# Patient Record
Sex: Male | Born: 1940 | Race: White | Hispanic: No | Marital: Married | Smoking: Former smoker
Health system: Southern US, Community
[De-identification: ages and names within clinical notes are randomized; demographics above are authoritative.]

## PROBLEM LIST (undated history)

## (undated) DIAGNOSIS — D649 Anemia, unspecified: Secondary | ICD-10-CM

## (undated) DIAGNOSIS — D494 Neoplasm of unspecified behavior of bladder: Secondary | ICD-10-CM

## (undated) DIAGNOSIS — M1990 Unspecified osteoarthritis, unspecified site: Secondary | ICD-10-CM

## (undated) DIAGNOSIS — K219 Gastro-esophageal reflux disease without esophagitis: Secondary | ICD-10-CM

## (undated) DIAGNOSIS — Z87442 Personal history of urinary calculi: Secondary | ICD-10-CM

## (undated) DIAGNOSIS — C801 Malignant (primary) neoplasm, unspecified: Secondary | ICD-10-CM

## (undated) DIAGNOSIS — F419 Anxiety disorder, unspecified: Secondary | ICD-10-CM

## (undated) DIAGNOSIS — E119 Type 2 diabetes mellitus without complications: Secondary | ICD-10-CM

## (undated) DIAGNOSIS — R011 Cardiac murmur, unspecified: Secondary | ICD-10-CM

## (undated) DIAGNOSIS — G4730 Sleep apnea, unspecified: Secondary | ICD-10-CM

## (undated) HISTORY — PX: CHOLECYSTECTOMY: SHX55

## (undated) HISTORY — PX: OTHER SURGICAL HISTORY: SHX169

## (undated) HISTORY — PX: LIVER BIOPSY: SHX301

---

## 2012-09-20 DIAGNOSIS — G4730 Sleep apnea, unspecified: Secondary | ICD-10-CM | POA: Insufficient documentation

## 2012-09-20 DIAGNOSIS — K219 Gastro-esophageal reflux disease without esophagitis: Secondary | ICD-10-CM | POA: Diagnosis present

## 2021-12-16 ENCOUNTER — Other Ambulatory Visit: Payer: Self-pay

## 2021-12-16 ENCOUNTER — Inpatient Hospital Stay (HOSPITAL_COMMUNITY): Payer: MEDICARE

## 2021-12-16 ENCOUNTER — Inpatient Hospital Stay (HOSPITAL_COMMUNITY): Payer: MEDICARE | Attending: Hematology | Admitting: Hematology

## 2021-12-16 DIAGNOSIS — Z87442 Personal history of urinary calculi: Secondary | ICD-10-CM | POA: Insufficient documentation

## 2021-12-16 DIAGNOSIS — R319 Hematuria, unspecified: Secondary | ICD-10-CM | POA: Insufficient documentation

## 2021-12-16 DIAGNOSIS — K56609 Unspecified intestinal obstruction, unspecified as to partial versus complete obstruction: Secondary | ICD-10-CM | POA: Insufficient documentation

## 2021-12-16 DIAGNOSIS — D72825 Bandemia: Secondary | ICD-10-CM

## 2021-12-16 DIAGNOSIS — Z79899 Other long term (current) drug therapy: Secondary | ICD-10-CM | POA: Diagnosis not present

## 2021-12-16 DIAGNOSIS — K5900 Constipation, unspecified: Secondary | ICD-10-CM | POA: Diagnosis not present

## 2021-12-16 DIAGNOSIS — Z809 Family history of malignant neoplasm, unspecified: Secondary | ICD-10-CM

## 2021-12-16 DIAGNOSIS — R42 Dizziness and giddiness: Secondary | ICD-10-CM | POA: Insufficient documentation

## 2021-12-16 DIAGNOSIS — M549 Dorsalgia, unspecified: Secondary | ICD-10-CM | POA: Diagnosis not present

## 2021-12-16 DIAGNOSIS — D649 Anemia, unspecified: Secondary | ICD-10-CM | POA: Insufficient documentation

## 2021-12-16 DIAGNOSIS — Z801 Family history of malignant neoplasm of trachea, bronchus and lung: Secondary | ICD-10-CM | POA: Diagnosis not present

## 2021-12-16 DIAGNOSIS — Z87891 Personal history of nicotine dependence: Secondary | ICD-10-CM | POA: Insufficient documentation

## 2021-12-16 DIAGNOSIS — R519 Headache, unspecified: Secondary | ICD-10-CM | POA: Insufficient documentation

## 2021-12-16 DIAGNOSIS — D509 Iron deficiency anemia, unspecified: Secondary | ICD-10-CM | POA: Insufficient documentation

## 2021-12-16 DIAGNOSIS — D72828 Other elevated white blood cell count: Secondary | ICD-10-CM | POA: Insufficient documentation

## 2021-12-16 DIAGNOSIS — D72829 Elevated white blood cell count, unspecified: Secondary | ICD-10-CM | POA: Insufficient documentation

## 2021-12-16 LAB — RETICULOCYTES
Immature Retic Fract: 15.4 % (ref 2.3–15.9)
RBC.: 4.26 MIL/uL (ref 4.22–5.81)
Retic Count, Absolute: 82.6 10*3/uL (ref 19.0–186.0)
Retic Ct Pct: 1.9 % (ref 0.4–3.1)

## 2021-12-16 LAB — CBC WITH DIFFERENTIAL/PLATELET
Abs Immature Granulocytes: 0.11 10*3/uL — ABNORMAL HIGH (ref 0.00–0.07)
Basophils Absolute: 0.1 10*3/uL (ref 0.0–0.1)
Basophils Relative: 1 %
Eosinophils Absolute: 0.3 10*3/uL (ref 0.0–0.5)
Eosinophils Relative: 3 %
HCT: 28.5 % — ABNORMAL LOW (ref 39.0–52.0)
Hemoglobin: 8.8 g/dL — ABNORMAL LOW (ref 13.0–17.0)
Immature Granulocytes: 1 %
Lymphocytes Relative: 15 %
Lymphs Abs: 1.7 10*3/uL (ref 0.7–4.0)
MCH: 20.4 pg — ABNORMAL LOW (ref 26.0–34.0)
MCHC: 30.9 g/dL (ref 30.0–36.0)
MCV: 66.1 fL — ABNORMAL LOW (ref 80.0–100.0)
Monocytes Absolute: 0.7 10*3/uL (ref 0.1–1.0)
Monocytes Relative: 7 %
Neutro Abs: 8.2 10*3/uL — ABNORMAL HIGH (ref 1.7–7.7)
Neutrophils Relative %: 73 %
Platelets: 364 10*3/uL (ref 150–400)
RBC: 4.31 MIL/uL (ref 4.22–5.81)
RDW: 16.1 % — ABNORMAL HIGH (ref 11.5–15.5)
WBC: 11.1 10*3/uL — ABNORMAL HIGH (ref 4.0–10.5)
nRBC: 0 % (ref 0.0–0.2)

## 2021-12-16 LAB — FOLATE: Folate: 46.1 ng/mL (ref >5.9)

## 2021-12-16 LAB — IRON AND TIBC
Iron: 103 ug/dL (ref 45–182)
Saturation Ratios: 30 % (ref 17.9–39.5)
TIBC: 344 ug/dL (ref 250–450)
UIBC: 241 ug/dL

## 2021-12-16 LAB — VITAMIN B12: Vitamin B-12: 755 pg/mL (ref 180–914)

## 2021-12-16 LAB — LACTATE DEHYDROGENASE: LDH: 112 U/L (ref 98–192)

## 2021-12-16 LAB — C-REACTIVE PROTEIN: CRP: 0.7 mg/dL (ref <1.0)

## 2021-12-16 LAB — SEDIMENTATION RATE: Sed Rate: 11 mm/hr (ref 0–16)

## 2021-12-16 LAB — FERRITIN: Ferritin: 20 ng/mL — ABNORMAL LOW (ref 24–336)

## 2021-12-16 NOTE — Progress Notes (Signed)
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 male is here because of evaluation of neutrophilia, at the request of Dr. Macarthur Critchley.  Today he reports feeling good, and he is accompanied by his wife. He reports history of anemia for most of his life. He denies history of blood transfusions. He denies fevers and night sweats. He reports losing about 22 lbs since last summer; his baseline weight was 214 lbs. His wife reports his appetite has decreased. He reports occasional bloating after eating which well prevent him from eating any more meals that day. In January 2023 he has hospitalized for a small bowel obstruction which did not require surgery as well as a bladder infection for which he was given antibiotics; he is unaware of what caused his bowel obstruction. During this hospitalization he was given an iron infusion; he reports hematuria around the time of this hospitalization. He reports history of kidney stones. His WBC was first elevated October 2022. He denies recurrent infections over the past 1-2 years. He denies history of CVA, PE, DVT, and MI. He reports 2-3 previous colonoscopies the last of which was about 5 years ago. He denies skin rashes. He denies hematochezia and black stool.   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 He quit smoking 38 years ago. He denies family history of blood conditions. He denies family history of leukemia. His brother has lung and adrenal cancer, and his maternal grandmother had cancer although he cannot recall  what kind.   MEDICAL HISTORY:  No past medical history on file.  SURGICAL HISTORY:   SOCIAL HISTORY: Social History   Socioeconomic History   Marital status: Married    Spouse name: Not on file   Number of children: Not on file   Years of education: Not on file   Highest education level: Not on file  Occupational History   Not on file  Tobacco Use   Smoking status: Not on file   Smokeless tobacco: Not on file  Substance and Sexual Activity   Alcohol use: Not on file   Drug use: Not on file   Sexual activity: Not on file  Other Topics Concern   Not on file  Social History Narrative   Not on file   Social Determinants of Health   Financial Resource Strain: Not on file  Food Insecurity: Not on file  Transportation Needs: Not on file  Physical Activity: Not on file  Stress: Not on file  Social Connections: Not on file  Intimate Partner Violence: Not on file    FAMILY HISTORY: No family history on file.  ALLERGIES:  has no allergies on file.  MEDICATIONS:  Current Outpatient Medications  Medication Sig Dispense Refill   citalopram (CELEXA) 10 MG tablet Take by mouth.     HYDROcodone-acetaminophen (NORCO) 7.5-325 MG tablet Take 1 tablet by mouth 3 (three) times daily as needed.     HYDROcodone-acetaminophen (NORCO/VICODIN) 5-325 MG tablet Take 1 tablet by mouth 3 (three) times daily as needed.     metFORMIN (GLUCOPHAGE) 500 MG tablet Take by mouth 2 (two) times daily with a meal.  Multiple Vitamin (MULTI-VITAMIN DAILY PO) Take by mouth.     niacin 500 MG tablet Take 500 mg by mouth at bedtime.     omeprazole (PRILOSEC) 20 MG capsule Take by mouth.     saw palmetto 500 MG capsule Take 500 mg by mouth daily.     simvastatin (ZOCOR) 40 MG tablet Take by mouth.     tamsulosin (FLOMAX) 0.4 MG CAPS capsule Take 0.4 mg by mouth.     tizanidine (ZANAFLEX) 2 MG capsule Take 2 mg by mouth 3 (three) times daily.     No current facility-administered medications for this  visit.    REVIEW OF SYSTEMS:   Review of Systems  Constitutional:  Positive for appetite change and unexpected weight change (-22 lbs). Negative for fatigue and fever.  Gastrointestinal:  Positive for constipation. Negative for blood in stool.  Endocrine: Negative for hot flashes.  Genitourinary:  Positive for hematuria.   Musculoskeletal:  Positive for back pain.  Skin:  Negative for rash.  Neurological:  Positive for dizziness and headaches.  Psychiatric/Behavioral:  The patient is nervous/anxious.   All other systems reviewed and are negative.   PHYSICAL EXAMINATION: ECOG PERFORMANCE STATUS: 1 - Symptomatic but completely ambulatory  Vitals:   12/16/21 0955  BP: 132/63  Pulse: 88  Resp: 18  Temp: 98.4 F (36.9 C)  SpO2: 98%   Filed Weights   12/16/21 0955  Weight: 192 lb 1.6 oz (87.1 kg)   Physical Exam Vitals reviewed.  Constitutional:      Appearance: Normal appearance.  Cardiovascular:     Rate and Rhythm: Normal rate and regular rhythm.     Pulses: Normal pulses.     Heart sounds: Normal heart sounds.  Pulmonary:     Effort: Pulmonary effort is normal.     Breath sounds: Normal breath sounds.  Abdominal:     Palpations: Abdomen is soft. There is no hepatomegaly, splenomegaly or mass.     Tenderness: There is no abdominal tenderness.  Musculoskeletal:     Right lower leg: No edema.     Left lower leg: No edema.  Lymphadenopathy:     Cervical: No cervical adenopathy.     Right cervical: No superficial cervical adenopathy.    Left cervical: No superficial cervical adenopathy.     Upper Body:     Right upper body: No supraclavicular or axillary adenopathy.     Left upper body: No supraclavicular or axillary adenopathy.     Lower Body: No right inguinal adenopathy. No left inguinal adenopathy.  Neurological:     General: No focal deficit present.     Mental Status: He is alert and oriented to person, place, and time.  Psychiatric:        Mood and Affect:  Mood normal.        Behavior: Behavior normal.     LABORATORY DATA:  I have reviewed the data as listed Recent Results (from the past 2160 hour(s))  Sedimentation rate     Status: None   Collection Time: 12/16/21 11:10 AM  Result Value Ref Range   Sed Rate 11 0 - 16 mm/hr    Comment: Performed at [facility], [address], [place], [place] [address]  Vitamin B12     Status: None   Collection Time: 12/16/21 11:10 AM  Result Value Ref Range   Vitamin B-12 755 180 - 914 pg/mL    Comment: (NOTE) This assay is not validated for testing neonatal or myeloproliferative syndrome  specimens for Vitamin B12 levels. Performed at [facility], [address] [place], [place] [address]   Ferritin     Status: Abnormal   Collection Time: 12/16/21 11:10 AM  Result Value Ref Range   Ferritin 20 (L) 24 - 336 ng/mL    Comment: Performed at [facility], [address] [place], [place] [address]  Iron and TIBC ([facility] ONLY)     Status: None   Collection Time: 12/16/21 11:10 AM  Result Value Ref Range   Iron 103 45 - 182 ug/dL   TIBC 344 250 - 450 ug/dL   Saturation Ratios 30 17.9 - 39.5 %   UIBC 241 ug/dL    Comment: Performed at [facility], [address], [place], [place] [address]  Lactate dehydrogenase     Status: None   Collection Time: 12/16/21 11:10 AM  Result Value Ref Range   LDH 112 98 - 192 U/L    Comment: Performed at [facility], [address] [place], [place] [address]  Reticulocytes     Status: None   Collection Time: 12/16/21 11:10 AM  Result Value Ref Range   Retic Ct Pct 1.9 0.4 - 3.1 %   RBC. 4.26 4.22 - 5.81 MIL/uL   Retic Count, Absolute 82.6 19.0 - 186.0 K/uL   Immature Retic Fract 15.4 2.3 - 15.9 %    Comment: Performed at [facility], [address], [place], [place] [address]  CBC with Differential     Status: Abnormal   Collection Time: 12/16/21 11:10 AM  Result Value Ref Range   WBC 11.1 (H) 4.0 - 10.5 K/uL   RBC 4.31 4.22 - 5.81  MIL/uL   Hemoglobin 8.8 (L) 13.0 - 17.0 g/dL    Comment: Reticulocyte Hemoglobin testing may be clinically indicated, consider ordering this additional test [id number]    HCT 28.5 (L) 39.0 - 52.0 %   MCV 66.1 (L) 80.0 - 100.0 fL   MCH 20.4 (L) 26.0 - 34.0 pg   MCHC 30.9 30.0 - 36.0 g/dL   RDW 16.1 (H) 11.5 - 15.5 %   Platelets 364 150 - 400 K/uL   nRBC 0.0 0.0 - 0.2 %   Neutrophils Relative % 73 %   Neutro Abs 8.2 (H) 1.7 - 7.7 K/uL   Lymphocytes Relative 15 %   Lymphs Abs 1.7 0.7 - 4.0 K/uL   Monocytes Relative 7 %   Monocytes Absolute 0.7 0.1 - 1.0 K/uL   Eosinophils Relative 3 %   Eosinophils Absolute 0.3 0.0 - 0.5 K/uL   Basophils Relative 1 %   Basophils Absolute 0.1 0.0 - 0.1 K/uL   WBC Morphology MORPHOLOGY UNREMARKABLE    RBC Morphology MORPHOLOGY UNREMARKABLE    Smear Review MORPHOLOGY UNREMARKABLE    Immature Granulocytes 1 %   Abs Immature Granulocytes 0.11 (H) 0.00 - 0.07 K/uL   Burr Cells PRESENT    Basophilic Stippling PRESENT     Comment: Performed at [facility], [address], [place], [place] [address]    RADIOGRAPHIC STUDIES: I have personally reviewed the radiological images as listed and agreed with the findings in the report. No results found.  ASSESSMENT:  Neutrophilic leukocytosis and microcytic anemia: - Patient seen at the request of Dr. Macarthur Critchley for evaluation of leukocytosis and anemia. - CBCD (10/31/2021): WBC-15.4 (N 68%, L 16%, M 5%, E 8%, B 1%) Hb-10.3, MCV-61, PLT-452 - BCR/ABL PCR: Negative on 10/31/2021 - CBCD (09/14/2021): WBC-14.6 (elevated ANC and AEC), Hb-10.5, MCV 62, PLT-384 -  CBCD (08/29/2021): WBC-13, Hb-11, MCV-65, PLT-395 (elevated ANC and AEC-0.5 - He reports that he has been anemic all his life.  No prior history of blood transfusion.  No fevers or night sweats. - 22 pound weight loss since at least summer 2022.  Reports decreased appetite.  Reports bloating after eating. - In January 2023, he was hospitalized in [place] for  bowel obstruction and bladder infection.  Nonsurgical management.  He received IV iron x1. - He had colonoscopy x3, last one in the 5 years negative.   Social/family history: - He lives at home with his wife.  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  Quit smoking 38 years ago. - No family history of leukemia.  Brother had lung cancer.  Maternal grandmother had cancer.   PLAN:  Neutrophilic leukocytosis and microcytic anemia: - We discussed etiologies of leukocytosis and anemia. - We will check his CBC with differential today, LDH and reticulocyte count. - We will check for nutritional deficiencies and bone marrow infiltrative process. - We will obtain flow cytometry. - We will send JAK2 V617F and reflex testing. - We will also check hemoglobin electrophoresis as there was mention of thalassemia trait.  Patient does not know about it. - Will obtain CT scan reports from [facility] from recent hospitalization. - We will also obtain creatinine to see if he has any CKD.   All questions were answered. The patient knows to call the clinic with any problems, questions or concerns.  Derek Jack, MD 12/16/21 1:07 PM  [facility] [phone]   I, Thana Ates, am acting as a scribe for Dr. Derek Jack.  I, Derek Jack MD, have reviewed the above documentation for accuracy and completeness, and I agree with the above.

## 2021-12-16 NOTE — Patient Instructions (Addendum)
[place] at [facility] ?Discharge Instructions ? ?You were seen and examined today by Dr. Delton Coombes. Dr. Delton Coombes is a hematologist, meaning that he specializes in blood abnormalities. Dr. Delton Coombes discussed your past medical history, family history of cancers/blood conditions and the events that led to you being here today. ? ?You were referred to Dr. Delton Coombes due to increased white blood cells (WBCs). You were also slightly anemic. Dr. Delton Coombes has recommended additional lab work today in order to identify the cause of your elevated WBCs. ? ?You were previously checked for leukemia and it was negative. ? ?Follow-up as scheduled. ? ? ?Thank you for choosing [place] at [facility] to provide your oncology and hematology care.  To afford each patient quality time with our provider, please arrive at least 15 minutes before your scheduled appointment time.  ? ?If you have a lab appointment with the [facility] please come in thru the Main Entrance and check in at the main information desk. ? ?You need to re-schedule your appointment should you arrive 10 or more minutes late.  We strive to give you quality time with our providers, and arriving late affects you and other patients whose appointments are after yours.  Also, if you no show three or more times for appointments you may be dismissed from the clinic at the providers discretion.     ?Again, thank you for choosing [facility].  Our hope is that these requests will decrease the amount of time that you wait before being seen by our physicians.       ?_____________________________________________________________ ? ?Should you have questions after your visit to [facility], please contact our office at [phone] and follow the prompts.  Our office hours are 8:00 a.m. and 4:30 p.m. Monday - Friday.  Please note that voicemails left after 4:00 p.m. may not be returned until  the following business day.  We are closed weekends and major holidays.  You do have access to a nurse 24-7, just call the main number to the clinic [phone] and do not press any options, hold on the line and a nurse will answer the phone.   ? ?For prescription refill requests, have your pharmacy contact our office and allow 72 hours.   ? ?Due to Covid, you will need to wear a mask upon entering the hospital. If you do not have a mask, a mask will be given to you at the Main Entrance upon arrival. For doctor visits, patients may have 1 support person age 37 or older with them. For treatment visits, patients can not have anyone with them due to social distancing guidelines and our immunocompromised population.  ? ? ? ?

## 2021-12-17 LAB — HAPTOGLOBIN: Haptoglobin: 91 mg/dL (ref 34–355)

## 2021-12-17 LAB — RHEUMATOID FACTOR: Rheumatoid fact SerPl-aCnc: <10 IU/mL (ref <14.0)

## 2021-12-19 LAB — PROTEIN ELECTROPHORESIS, SERUM
A/G Ratio: 1.5 (ref 0.7–1.7)
Albumin ELP: 3.9 g/dL (ref 2.9–4.4)
Alpha-1-Globulin: 0.2 g/dL (ref 0.0–0.4)
Alpha-2-Globulin: 0.6 g/dL (ref 0.4–1.0)
Beta Globulin: 1 g/dL (ref 0.7–1.3)
Gamma Globulin: 0.7 g/dL (ref 0.4–1.8)
Globulin, Total: 2.6 g/dL (ref 2.2–3.9)
Total Protein ELP: 6.5 g/dL (ref 6.0–8.5)

## 2021-12-19 LAB — KAPPA/LAMBDA LIGHT CHAINS
Kappa free light chain: 29.6 mg/L — ABNORMAL HIGH (ref 3.3–19.4)
Kappa, lambda light chain ratio: 1.24 (ref 0.26–1.65)
Lambda free light chains: 23.9 mg/L (ref 5.7–26.3)

## 2021-12-19 LAB — METHYLMALONIC ACID, SERUM: Methylmalonic Acid, Quantitative: 154 nmol/L (ref 0–378)

## 2021-12-19 LAB — COPPER, SERUM: Copper: 92 ug/dL (ref 69–132)

## 2021-12-19 LAB — SURGICAL PATHOLOGY

## 2021-12-20 LAB — HGB FRACTIONATION CASCADE
Hgb A2: 5.1 % — ABNORMAL HIGH (ref 1.8–3.2)
Hgb A: 94.9 % — ABNORMAL LOW (ref 96.4–98.8)
Hgb F: 0 % (ref 0.0–2.0)
Hgb S: 0 %

## 2021-12-22 LAB — ANTINUCLEAR ANTIBODIES, IFA: ANA Ab, IFA: NEGATIVE

## 2021-12-26 LAB — CALR + JAK2 E12-15 + MPL (REFLEXED)

## 2021-12-26 LAB — JAK2 V617F, W REFLEX TO CALR/E12/MPL

## 2021-12-30 LAB — FLOW CYTOMETRY

## 2022-01-11 ENCOUNTER — Inpatient Hospital Stay (HOSPITAL_COMMUNITY): Payer: MEDICARE | Attending: Hematology | Admitting: Hematology

## 2022-01-11 VITALS — BP 137/65 | HR 85 | Temp 98.0°F | Resp 20 | Ht 70.87 in | Wt 188.5 lb

## 2022-01-11 DIAGNOSIS — D563 Thalassemia minor: Secondary | ICD-10-CM | POA: Insufficient documentation

## 2022-01-11 DIAGNOSIS — Z79899 Other long term (current) drug therapy: Secondary | ICD-10-CM | POA: Insufficient documentation

## 2022-01-11 DIAGNOSIS — D509 Iron deficiency anemia, unspecified: Secondary | ICD-10-CM | POA: Insufficient documentation

## 2022-01-11 DIAGNOSIS — R200 Anesthesia of skin: Secondary | ICD-10-CM | POA: Insufficient documentation

## 2022-01-11 DIAGNOSIS — D72828 Other elevated white blood cell count: Secondary | ICD-10-CM | POA: Diagnosis present

## 2022-01-11 DIAGNOSIS — R350 Frequency of micturition: Secondary | ICD-10-CM | POA: Diagnosis not present

## 2022-01-11 DIAGNOSIS — D72825 Bandemia: Secondary | ICD-10-CM

## 2022-01-11 NOTE — Patient Instructions (Signed)
[facility] at [facility] ?Discharge Instructions ? ?You were seen and examined today by Dr. Delton Coombes. He reviewed your most recent labs and everything looks good except your iron levels are low. Dr. Delton Coombes recommends two IV infusion of iron. Your labs did not show any signs of leukemia. He will check a lab that looks for chronic leukemia at your next visit but you have a very low risk. Please keep follow up appointments as scheduled in 6-8 weeks.  ? ? ?Thank you for choosing [place] at [facility] to provide your oncology and hematology care.  To afford each patient quality time with our provider, please arrive at least 15 minutes before your scheduled appointment time.  ? ?If you have a lab appointment with the [facility] please come in thru the Main Entrance and check in at the main information desk. ? ?You need to re-schedule your appointment should you arrive 10 or more minutes late.  We strive to give you quality time with our providers, and arriving late affects you and other patients whose appointments are after yours.  Also, if you no show three or more times for appointments you may be dismissed from the clinic at the providers discretion.     ?Again, thank you for choosing [facility].  Our hope is that these requests will decrease the amount of time that you wait before being seen by our physicians.       ?_____________________________________________________________ ? ?Should you have questions after your visit to [facility], please contact our office at [phone] and follow the prompts.  Our office hours are 8:00 a.m. and 4:30 p.m. Monday - Friday.  Please note that voicemails left after 4:00 p.m. may not be returned until the following business day.  We are closed weekends and major holidays.  You do have access to a nurse 24-7, just call the main number to the clinic [phone] and do not press any options,  hold on the line and a nurse will answer the phone.   ? ?For prescription refill requests, have your pharmacy contact our office and allow 72 hours.   ? ?Due to Covid, you will need to wear a mask upon entering the hospital. If you do not have a mask, a mask will be given to you at the Main Entrance upon arrival. For doctor visits, patients may have 1 support person age 67 or older with them. For treatment visits, patients can not have anyone with them due to social distancing guidelines and our immunocompromised population.  ? ?  ?

## 2022-01-11 NOTE — Progress Notes (Signed)
? ?[facility] ?[address] ?[place],  [address] ? ? ?CLINIC:  ?Medical Oncology/Hematology ? ?PCP:  ?Earney Mallet, MD ?[address] / [place] [address]  ?[phone] ? ?REASON FOR VISIT:  ?Follow-up for neutrophilic leukocytosis and microcytic anemia ? ?PRIOR THERAPY: none ? ?CURRENT THERAPY: under work-up ? ?INTERVAL HISTORY:  ?[name], a 81 y.o. male, returns for routine follow-up for his neutrophilic leukocytosis and microcytic anemia. Joshua Osborne was last seen on 12/16/2021. ? ?Today he reports feeling good, and he is accompanied by his wife. He is not taking iron tablets, but he reports he previously took iron tablets while hospitalized and did not tolerate them well.  ? ?REVIEW OF SYSTEMS:  ?Review of Systems  ?Constitutional:  Negative for appetite change and fatigue.  ?Genitourinary:  Positive for frequency.   ?Neurological:  Positive for numbness.  ?All other systems reviewed and are negative. ? ?PAST MEDICAL/SURGICAL HISTORY:  ?No past medical history on file. ? ? ?SOCIAL HISTORY:  ?Social History  ? ?Socioeconomic History  ? Marital status: Married  ?  Spouse name: Not on file  ? Number of children: Not on file  ? Years of education: Not on file  ? Highest education level: Not on file  ?Occupational History  ? Not on file  ?Tobacco Use  ? Smoking status: Not on file  ? Smokeless tobacco: Not on file  ?Substance and Sexual Activity  ? Alcohol use: Not on file  ? Drug use: Not on file  ? Sexual activity: Not on file  ?Other Topics Concern  ? Not on file  ?Social History Narrative  ? Not on file  ? ?Social Determinants of Health  ? ?Financial Resource Strain: Not on file  ?Food Insecurity: Not on file  ?Transportation Needs: Not on file  ?Physical Activity: Not on file  ?Stress: Not on file  ?Social Connections: Not on file  ?Intimate Partner Violence: Not on file  ? ? ?FAMILY HISTORY:  ?No family history on file. ? ?CURRENT MEDICATIONS:  ?Current Outpatient Medications   ?Medication Sig Dispense Refill  ? citalopram (CELEXA) 10 MG tablet Take by mouth.    ? HYDROcodone-acetaminophen (NORCO) 7.5-325 MG tablet Take 1 tablet by mouth 3 (three) times daily as needed.    ? HYDROcodone-acetaminophen (NORCO/VICODIN) 5-325 MG tablet Take 1 tablet by mouth 3 (three) times daily as needed.    ? latanoprost (XALATAN) 0.005 % ophthalmic solution 1 drop at bedtime.    ? metFORMIN (GLUCOPHAGE) 500 MG tablet Take by mouth 2 (two) times daily with a meal.    ? Multiple Vitamin (MULTI-VITAMIN DAILY PO) Take by mouth.    ? niacin 500 MG tablet Take 500 mg by mouth at bedtime.    ? omeprazole (PRILOSEC) 20 MG capsule Take by mouth.    ? saw palmetto 500 MG capsule Take 500 mg by mouth daily.    ? simvastatin (ZOCOR) 40 MG tablet Take by mouth.    ? tamsulosin (FLOMAX) 0.4 MG CAPS capsule Take 0.4 mg by mouth.    ? tizanidine (ZANAFLEX) 2 MG capsule Take 2 mg by mouth 3 (three) times daily.    ? ?No current facility-administered medications for this visit.  ? ? ?ALLERGIES:  ?Not on File ? ?PHYSICAL EXAM:  ?Performance status (ECOG): 1 - Symptomatic but completely ambulatory ? ?Vitals:  ? 01/11/22 1428  ?BP: 137/65  ?Pulse: 85  ?Resp: 20  ?Temp: 98 ?F (36.7 ?C)  ?SpO2: 99%  ? ?Wt Readings  from Last 3 Encounters:  ?01/11/22 188 lb 8 oz (85.5 kg)  ?12/16/21 192 lb 1.6 oz (87.1 kg)  ? ?Physical Exam ?Vitals reviewed.  ?Constitutional:   ?   Appearance: Normal appearance.  ?Cardiovascular:  ?   Rate and Rhythm: Normal rate and regular rhythm.  ?   Pulses: Normal pulses.  ?   Heart sounds: Normal heart sounds.  ?Pulmonary:  ?   Effort: Pulmonary effort is normal.  ?   Breath sounds: Normal breath sounds.  ?Neurological:  ?   General: No focal deficit present.  ?   Mental Status: He is alert and oriented to person, place, and time.  ?Psychiatric:     ?   Mood and Affect: Mood normal.     ?   Behavior: Behavior normal.  ? ? ?LABORATORY DATA:  ?I have reviewed the labs as listed.  ? ?  Latest Ref Rng & Units  12/16/2021  ? 11:10 AM  ?CBC  ?WBC 4.0 - 10.5 K/uL 11.1    ?Hemoglobin 13.0 - 17.0 g/dL 8.8    ?Hematocrit 39.0 - 52.0 % 28.5    ?Platelets 150 - 400 K/uL 364    ? ?   ? View : No data to display.  ?  ?  ?  ? ?   ?Component Value Date/Time  ? RBC 4.26 12/16/2021 1110  ? RBC 4.31 12/16/2021 1110  ? MCV 66.1 (L) 12/16/2021 1110  ? MCH 20.4 (L) 12/16/2021 1110  ? MCHC 30.9 12/16/2021 1110  ? RDW 16.1 (H) 12/16/2021 1110  ? LYMPHSABS 1.7 12/16/2021 1110  ? MONOABS 0.7 12/16/2021 1110  ? EOSABS 0.3 12/16/2021 1110  ? BASOSABS 0.1 12/16/2021 1110  ? ? ?DIAGNOSTIC IMAGING:  ?I have independently reviewed the scans and discussed with the patient. ?No results found.  ? ?ASSESSMENT:  ?Neutrophilic leukocytosis and microcytic anemia: ?- Patient seen at the request of Dr. Macarthur Critchley for evaluation of leukocytosis and anemia. ?- CBCD (10/31/2021): WBC-15.4 (N 68%, L 16%, M 5%, E 8%, B 1%) Hb-10.3, MCV-61, PLT-452 ?- BCR/ABL PCR: Negative on 10/31/2021 ?- CBCD (09/14/2021): WBC-14.6 (elevated ANC and AEC), Hb-10.5, MCV 62, PLT-384 ?- CBCD (08/29/2021): WBC-13, Hb-11, MCV-65, PLT-395 (elevated ANC and AEC-0.5 ?- He reports that he has been anemic all his life.  No prior history of blood transfusion.  No fevers or night sweats. ?- 22 pound weight loss since at least summer 2022.  Reports decreased appetite.  Reports bloating after eating. ?- In January 2023, he was hospitalized in [place] for bowel obstruction and bladder infection.  Nonsurgical management.  He received IV iron x1. ?- He had colonoscopy x3, last one in the 5 years negative. ? ?  ?Social/family history: ?- He lives at home with his wife.  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  Quit smoking 38 years ago. ?- No family history of leukemia.  Brother had lung cancer.  Maternal grandmother had cancer. ? ? 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 ?-  Anemia work-up showed hemoglobin 8.8 with MCV 66.  Ferritin was 20 and percent saturation 30.  B12, MMA, copper, folic acid were normal. ?- He has taken iron tablet without any benefit. ?- Recommend Feraheme weekly x2. ?- Discussed side effects in detail. ?- RTC 6 to 8 weeks with repeat CBC, ferritin and iron panel. ? ?2.  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 ?- JAK2 V617F and reflex mutation testing was negative. ?- Flow cytometry did not show any monoclonal B or T-cell population. ?- LDH was normal.  ANA and rheumatoid factor were negative. ?- No B symptoms.  Recommend BCR/ABL by FISH at next visit. ? ? ?Orders placed this encounter:  ?No orders of the defined types were placed in this encounter. ? ? ? ?Derek Jack, MD ?[place] ?[phone] ? ? ?I, Thana Ates, am acting as a scribe for Dr. Derek Jack. ? ?I, Derek Jack MD, have reviewed the above documentation for accuracy and completeness, and I agree with the above. ?  ? ? ?

## 2022-01-18 ENCOUNTER — Inpatient Hospital Stay (HOSPITAL_COMMUNITY): Payer: MEDICARE

## 2022-01-18 VITALS — BP 148/73 | HR 79 | Temp 97.2°F | Resp 18

## 2022-01-18 DIAGNOSIS — D72828 Other elevated white blood cell count: Secondary | ICD-10-CM | POA: Diagnosis not present

## 2022-01-18 DIAGNOSIS — D509 Iron deficiency anemia, unspecified: Secondary | ICD-10-CM

## 2022-01-18 MED ORDER — SODIUM CHLORIDE 0.9 % IV SOLN
510.0000 mg | Freq: Once | INTRAVENOUS | Status: AC
  Administered 2022-01-18: 510 mg via INTRAVENOUS
  Filled 2022-01-18: qty 510

## 2022-01-18 MED ORDER — SODIUM CHLORIDE 0.9 % IV SOLN: Freq: Once | INTRAVENOUS | Status: AC

## 2022-01-18 NOTE — Patient Instructions (Signed)
[facility]  Discharge Instructions: °Thank you for choosing [facility] to provide your oncology and hematology care.  °If you have a lab appointment with the Cancer Center, please come in thru the Main Entrance and check in at the main information desk. ° °Wear comfortable clothing and clothing appropriate for easy access to any Portacath or PICC line.  ° °We strive to give you quality time with your provider. You may need to reschedule your appointment if you arrive late (15 or more minutes).  Arriving late affects you and other patients whose appointments are after yours.  Also, if you miss three or more appointments without notifying the office, you may be dismissed from the clinic at the provider’s discretion.    °  °For prescription refill requests, have your pharmacy contact our office and allow 72 hours for refills to be completed.   ° °Today you received the following chemotherapy and/or immunotherapy agents Feraheme    °  °To help prevent nausea and vomiting after your treatment, we encourage you to take your nausea medication as directed. ° °BELOW ARE SYMPTOMS THAT SHOULD BE REPORTED IMMEDIATELY: °*FEVER GREATER THAN 100.4 F (38 °C) OR HIGHER °*CHILLS OR SWEATING °*NAUSEA AND VOMITING THAT IS NOT CONTROLLED WITH YOUR NAUSEA MEDICATION °*UNUSUAL SHORTNESS OF BREATH °*UNUSUAL BRUISING OR BLEEDING °*URINARY PROBLEMS (pain or burning when urinating, or frequent urination) °*BOWEL PROBLEMS (unusual diarrhea, constipation, pain near the anus) °TENDERNESS IN MOUTH AND THROAT WITH OR WITHOUT PRESENCE OF ULCERS (sore throat, sores in mouth, or a toothache) °UNUSUAL RASH, SWELLING OR PAIN  °UNUSUAL VAGINAL DISCHARGE OR ITCHING  ° °Items with * indicate a potential emergency and should be followed up as soon as possible or go to the Emergency Department if any problems should occur. ° °Please show the CHEMOTHERAPY ALERT CARD or IMMUNOTHERAPY ALERT CARD at check-in to the Emergency  Department and triage nurse. ° °Should you have questions after your visit or need to cancel or reschedule your appointment, please contact [facility] [phone]  and follow the prompts.  Office hours are 8:00 a.m. to 4:30 p.m. Monday - Friday. Please note that voicemails left after 4:00 p.m. may not be returned until the following business day.  We are closed weekends and major holidays. You have access to a nurse at all times for urgent questions. Please call the main number to the clinic [phone] and follow the prompts. ° °For any non-urgent questions, you may also contact your provider using MyChart. We now offer e-Visits for anyone 18 and older to request care online for non-urgent symptoms. For details visit [email or web address]. °  °Also download the MyChart app! Go to the app store, search "MyChart", open the app, select [facility], and log in with your MyChart username and password. ° °Due to Covid, a mask is required upon entering the hospital/clinic. If you do not have a mask, one will be given to you upon arrival. For doctor visits, patients may have 1 support person aged 18 or older with them. For treatment visits, patients cannot have anyone with them due to current Covid guidelines and our immunocompromised population.  °

## 2022-01-18 NOTE — Progress Notes (Signed)
Patient presents today for Feraheme infusion per providers order.  Vital signs WNL  Patient has no new complaints at this time.    Peripheral IV started and blood return noted pre and post infusion.  Feraheme given today per MD orders.  Stable during infusion without adverse affects.  Vital signs stable.  No complaints at this time.  Discharge from clinic ambulatory in stable condition.  Alert and oriented X 3.  Follow up with [facility] as scheduled.  

## 2022-01-25 ENCOUNTER — Inpatient Hospital Stay (HOSPITAL_COMMUNITY): Payer: MEDICARE

## 2022-01-25 ENCOUNTER — Encounter (HOSPITAL_COMMUNITY): Payer: Self-pay

## 2022-01-25 VITALS — BP 127/54 | HR 80 | Temp 97.5°F | Resp 18

## 2022-01-25 DIAGNOSIS — D72828 Other elevated white blood cell count: Secondary | ICD-10-CM | POA: Diagnosis not present

## 2022-01-25 DIAGNOSIS — D509 Iron deficiency anemia, unspecified: Secondary | ICD-10-CM

## 2022-01-25 MED ORDER — SODIUM CHLORIDE 0.9 % IV SOLN: Freq: Once | INTRAVENOUS | Status: AC

## 2022-01-25 MED ORDER — SODIUM CHLORIDE 0.9 % IV SOLN
510.0000 mg | Freq: Once | INTRAVENOUS | Status: AC
  Administered 2022-01-25: 510 mg via INTRAVENOUS
  Filled 2022-01-25: qty 510

## 2022-01-25 NOTE — Patient Instructions (Signed)
[facility]  Discharge Instructions: ?Thank you for choosing [facility] to provide your oncology and hematology care.  ?If you have a lab appointment with the [facility], please come in thru the Main Entrance and check in at the main information desk. ? ?Wear comfortable clothing and clothing appropriate for easy access to any Portacath or PICC line.  ? ?We strive to give you quality time with your provider. You may need to reschedule your appointment if you arrive late (15 or more minutes).  Arriving late affects you and other patients whose appointments are after yours.  Also, if you miss three or more appointments without notifying the office, you may be dismissed from the clinic at the provider?s discretion.    ?  ?For prescription refill requests, have your pharmacy contact our office and allow 72 hours for refills to be completed.   ? ?Today you received the following chemotherapy and/or immunotherapy agents Feraheme ? ? ?BELOW ARE SYMPTOMS THAT SHOULD BE REPORTED IMMEDIATELY: ?*FEVER GREATER THAN 100.4 F (38 ?C) OR HIGHER ?*CHILLS OR SWEATING ?*NAUSEA AND VOMITING THAT IS NOT CONTROLLED WITH YOUR NAUSEA MEDICATION ?*UNUSUAL SHORTNESS OF BREATH ?*UNUSUAL BRUISING OR BLEEDING ?*URINARY PROBLEMS (pain or burning when urinating, or frequent urination) ?*BOWEL PROBLEMS (unusual diarrhea, constipation, pain near the anus) ?TENDERNESS IN MOUTH AND THROAT WITH OR WITHOUT PRESENCE OF ULCERS (sore throat, sores in mouth, or a toothache) ?UNUSUAL RASH, SWELLING OR PAIN  ?UNUSUAL VAGINAL DISCHARGE OR ITCHING  ? ?Items with * indicate a potential emergency and should be followed up as soon as possible or go to the Emergency Department if any problems should occur. ? ?Please show the CHEMOTHERAPY ALERT CARD or IMMUNOTHERAPY ALERT CARD at check-in to the Emergency Department and triage nurse. ? ?Should you have questions after your visit or need to cancel or reschedule your appointment, please  contact [facility] [phone]  and follow the prompts.  Office hours are 8:00 a.m. to 4:30 p.m. Monday - Friday. Please note that voicemails left after 4:00 p.m. may not be returned until the following business day.  We are closed weekends and major holidays. You have access to a nurse at all times for urgent questions. Please call the main number to the clinic [phone] and follow the prompts. ? ?For any non-urgent questions, you may also contact your provider using MyChart. We now offer e-Visits for anyone 44 and older to request care online for non-urgent symptoms. For details visit [email or web address]. ?  ?Also download the MyChart app! Go to the app store, search "MyChart", open the app, select [facility], and log in with your MyChart username and password. ? ?Due to Covid, a mask is required upon entering the hospital/clinic. If you do not have a mask, one will be given to you upon arrival. For doctor visits, patients may have 1 support person aged 26 or older with them. For treatment visits, patients cannot have anyone with them due to current Covid guidelines and our immunocompromised population.  ?

## 2022-01-25 NOTE — Progress Notes (Signed)
Pt presents today for Feraheme IV iron infusion per provider's order. Vital signs stable and pt voiced no new complaints at this time.  Peripheral IV started with good blood return pre and post infusion.  Feraheme given today per MD orders. Tolerated infusion without adverse affects. Vital signs stable. No complaints at this time. Discharged from clinic ambulatory in stable condition. Alert and oriented x 3. F/U with [facility] as scheduled.    

## 2022-02-13 DIAGNOSIS — M545 Low back pain, unspecified: Secondary | ICD-10-CM | POA: Diagnosis present

## 2022-03-08 ENCOUNTER — Inpatient Hospital Stay (HOSPITAL_COMMUNITY): Payer: MEDICARE

## 2022-03-08 ENCOUNTER — Inpatient Hospital Stay (HOSPITAL_COMMUNITY): Payer: MEDICARE | Attending: Hematology | Admitting: Hematology

## 2022-03-08 VITALS — BP 136/64 | HR 76 | Temp 97.7°F | Resp 18 | Wt 184.4 lb

## 2022-03-08 DIAGNOSIS — Z79899 Other long term (current) drug therapy: Secondary | ICD-10-CM | POA: Diagnosis not present

## 2022-03-08 DIAGNOSIS — D509 Iron deficiency anemia, unspecified: Secondary | ICD-10-CM | POA: Insufficient documentation

## 2022-03-08 DIAGNOSIS — C642 Malignant neoplasm of left kidney, except renal pelvis: Secondary | ICD-10-CM | POA: Diagnosis not present

## 2022-03-08 DIAGNOSIS — G479 Sleep disorder, unspecified: Secondary | ICD-10-CM | POA: Diagnosis not present

## 2022-03-08 DIAGNOSIS — R160 Hepatomegaly, not elsewhere classified: Secondary | ICD-10-CM | POA: Diagnosis not present

## 2022-03-08 DIAGNOSIS — D72825 Bandemia: Secondary | ICD-10-CM

## 2022-03-08 DIAGNOSIS — K769 Liver disease, unspecified: Secondary | ICD-10-CM | POA: Diagnosis not present

## 2022-03-08 DIAGNOSIS — C787 Secondary malignant neoplasm of liver and intrahepatic bile duct: Secondary | ICD-10-CM | POA: Diagnosis not present

## 2022-03-08 DIAGNOSIS — D72828 Other elevated white blood cell count: Secondary | ICD-10-CM | POA: Diagnosis not present

## 2022-03-08 DIAGNOSIS — N2889 Other specified disorders of kidney and ureter: Secondary | ICD-10-CM

## 2022-03-08 DIAGNOSIS — D563 Thalassemia minor: Secondary | ICD-10-CM | POA: Insufficient documentation

## 2022-03-08 DIAGNOSIS — Z7952 Long term (current) use of systemic steroids: Secondary | ICD-10-CM | POA: Diagnosis not present

## 2022-03-08 DIAGNOSIS — M549 Dorsalgia, unspecified: Secondary | ICD-10-CM | POA: Diagnosis not present

## 2022-03-08 DIAGNOSIS — G8929 Other chronic pain: Secondary | ICD-10-CM | POA: Insufficient documentation

## 2022-03-08 DIAGNOSIS — R319 Hematuria, unspecified: Secondary | ICD-10-CM | POA: Insufficient documentation

## 2022-03-08 LAB — CBC WITH DIFFERENTIAL/PLATELET
Abs Immature Granulocytes: 0.39 10*3/uL — ABNORMAL HIGH (ref 0.00–0.07)
Basophils Absolute: 0.1 10*3/uL (ref 0.0–0.1)
Basophils Relative: 1 %
Eosinophils Absolute: 0.1 10*3/uL (ref 0.0–0.5)
Eosinophils Relative: 0 %
HCT: 28.4 % — ABNORMAL LOW (ref 39.0–52.0)
Hemoglobin: 9.1 g/dL — ABNORMAL LOW (ref 13.0–17.0)
Immature Granulocytes: 2 %
Lymphocytes Relative: 5 %
Lymphs Abs: 0.8 10*3/uL (ref 0.7–4.0)
MCH: 20.8 pg — ABNORMAL LOW (ref 26.0–34.0)
MCHC: 32 g/dL (ref 30.0–36.0)
MCV: 64.8 fL — ABNORMAL LOW (ref 80.0–100.0)
Monocytes Absolute: 0.6 10*3/uL (ref 0.1–1.0)
Monocytes Relative: 4 %
Neutro Abs: 14.6 10*3/uL — ABNORMAL HIGH (ref 1.7–7.7)
Neutrophils Relative %: 88 %
Platelets: 377 10*3/uL (ref 150–400)
RBC: 4.38 MIL/uL (ref 4.22–5.81)
RDW: 14.9 % (ref 11.5–15.5)
WBC: 16.5 10*3/uL — ABNORMAL HIGH (ref 4.0–10.5)
nRBC: 0 % (ref 0.0–0.2)

## 2022-03-08 LAB — IRON AND TIBC
Iron: 80 ug/dL (ref 45–182)
Saturation Ratios: 29 % (ref 17.9–39.5)
TIBC: 276 ug/dL (ref 250–450)
UIBC: 196 ug/dL

## 2022-03-08 LAB — FERRITIN: Ferritin: 110 ng/mL (ref 24–336)

## 2022-03-08 NOTE — Patient Instructions (Addendum)
[place] at [facility] Discharge Instructions  You were seen and examined today by Dr. Delton Coombes.  Dr. Delton Coombes discussed your most recent lab work. Your CBC (complete blood count including hemoglobin and white blood cells) is stable. Your iron levels and the test for leukemia have not resulted. If there is anything concerning with these results, our office will contact you. Otherwise, these results will be available on your MyChart account.  Dr. Delton Coombes received a message from Dr. Gloriann Loan regarding a recent CT scan. Your kidney is swollen with a soft tissue mass as well as areas noted in the liver. This is concerning for cancer. Dr. Delton Coombes has recommended a PET scan. A PET scan is a whole body CT scan that illuminates where there is cancer present within the body.  Dr. Delton Coombes would also recommend a biopsy to arrange exactly what these areas of concern are.  Follow-up as scheduled.  Thank you for choosing [place] at [facility] to provide your oncology and hematology care.  To afford each patient quality time with our provider, please arrive at least 15 minutes before your scheduled appointment time.   If you have a lab appointment with the [facility] please come in thru the Main Entrance and check in at the main information desk.  You need to re-schedule your appointment should you arrive 10 or more minutes late.  We strive to give you quality time with our providers, and arriving late affects you and other patients whose appointments are after yours.  Also, if you no show three or more times for appointments you may be dismissed from the clinic at the providers discretion.     Again, thank you for choosing [facility].  Our hope is that these requests will decrease the amount of time that you wait before being seen by our physicians.       _____________________________________________________________  Should you  have questions after your visit to [facility], please contact our office at [phone] and follow the prompts.  Our office hours are 8:00 a.m. and 4:30 p.m. Monday - Friday.  Please note that voicemails left after 4:00 p.m. may not be returned until the following business day.  We are closed weekends and major holidays.  You do have access to a nurse 24-7, just call the main number to the clinic [phone] and do not press any options, hold on the line and a nurse will answer the phone.    For prescription refill requests, have your pharmacy contact our office and allow 72 hours.    Due to Covid, you will need to wear a mask upon entering the hospital. If you do not have a mask, a mask will be given to you at the Main Entrance upon arrival. For doctor visits, patients may have 1 support person age 83 or older with them. For treatment visits, patients can not have anyone with them due to social distancing guidelines and our immunocompromised population.

## 2022-03-08 NOTE — Progress Notes (Signed)
[place] [place], [place] [address]   CLINIC:  Medical Oncology/Hematology  PCP:  Earney Mallet, MD [address] 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 male, returns for routine follow-up for his neutrophilic leukocytosis and microcytic anemia. Joshua Osborne was last seen on 01/11/2022.  Today he reports feeling good, and he is accompanied by his wife. He reports improved energy following his iron infusion; he was started on prednisone soon after his iron infusion and is unable to tell if the increased energy if from the iron infusion or the prednisone. He reports he was seen by Dr. Gloriann Loan at [facility] and following a CT was told of a possible mass in his left kidney. He reports intermittent hematuria since January 2023. He reports chronic back pain which is well controlled with 2 tablets of hydrocodone daily. He reports he has lost 30 lbs since December.   REVIEW OF SYSTEMS:  Review of Systems  Constitutional:  Positive for unexpected weight change (-30 lbs). Negative for appetite change and fatigue.  Genitourinary:  Positive for hematuria.   Musculoskeletal:  Positive for back pain (chronic - well controlled).  Psychiatric/Behavioral:  Positive for sleep disturbance.   All other systems reviewed and are negative.  PAST MEDICAL/SURGICAL HISTORY:  No past medical history on file. No past surgical history on file.  SOCIAL HISTORY:  Social History   Socioeconomic History   Marital status: Married    Spouse name: Not on file   Number of children: Not on file   Years of education: Not on file   Highest education level: Not on file  Occupational History   Not on file  Tobacco Use   Smoking status: Not on file   Smokeless tobacco: Not on file  Substance and  Sexual Activity   Alcohol use: Not on file   Drug use: Not on file   Sexual activity: Not on file  Other Topics Concern   Not on file  Social History Narrative   Not on file   Social Determinants of Health   Financial Resource Strain: Not on file  Food Insecurity: Not on file  Transportation Needs: Not on file  Physical Activity: Not on file  Stress: Not on file  Social Connections: Not on file  Intimate Partner Violence: Not on file    FAMILY HISTORY:  No family history on file.  CURRENT MEDICATIONS:  Current Outpatient Medications  Medication Sig Dispense Refill   citalopram (CELEXA) 10 MG tablet Take by mouth.     HYDROcodone-acetaminophen (NORCO/VICODIN) 5-325 MG tablet Take 1 tablet by mouth 3 (three) times daily as needed.     latanoprost (XALATAN) 0.005 % ophthalmic solution 1 drop at bedtime.     metFORMIN (GLUCOPHAGE) 500 MG tablet Take by mouth 2 (two) times daily with a meal.     Multiple Vitamin (MULTI-VITAMIN DAILY PO) Take by mouth.     niacin 500 MG tablet Take 500 mg by mouth at bedtime.     omeprazole (PRILOSEC) 20 MG capsule Take by mouth.     ondansetron (ZOFRAN-ODT) 4 MG disintegrating tablet ondansetron 4 mg disintegrating tablet  DISSOLVE 1 TABLET IN MOUTH EVERY 6 HOURS AS NEEDED FOR NAUSEA     predniSONE (DELTASONE) 10 MG tablet prednisone 10 mg tablet  take 1 tab three times  a day for 2 days, 1 tab twice a day for 5 days, 1 tab daily till finished     saw palmetto 500 MG capsule Take 500 mg by mouth daily.     simvastatin (ZOCOR) 40 MG tablet Take by mouth.     tamsulosin (FLOMAX) 0.4 MG CAPS capsule Take 0.4 mg by mouth.     tizanidine (ZANAFLEX) 2 MG capsule Take 2 mg by mouth 3 (three) times daily.     No current facility-administered medications for this visit.    ALLERGIES:  No Known Allergies  PHYSICAL EXAM:  Performance status (ECOG): 1 - Symptomatic but completely ambulatory  Vitals:   03/08/22 1400  BP: 136/64  Pulse: 76  Resp:  18  Temp: 97.7 F (36.5 C)  SpO2: 96%   Wt Readings from Last 3 Encounters:  03/08/22 184 lb 6.4 oz (83.6 kg)  01/11/22 188 lb 8 oz (85.5 kg)  12/16/21 192 lb 1.6 oz (87.1 kg)   Physical Exam Vitals reviewed.  Constitutional:      Appearance: Normal appearance.  Cardiovascular:     Rate and Rhythm: Normal rate and regular rhythm.     Pulses: Normal pulses.     Heart sounds: Normal heart sounds.  Pulmonary:     Effort: Pulmonary effort is normal.     Breath sounds: Normal breath sounds.  Neurological:     General: No focal deficit present.     Mental Status: He is alert and oriented to person, place, and time.  Psychiatric:        Mood and Affect: Mood normal.        Behavior: Behavior normal.    LABORATORY DATA:  I have reviewed the labs as listed.     Latest Ref Rng & Units 03/08/2022    1:30 PM 12/16/2021   11:10 AM  CBC  WBC 4.0 - 10.5 K/uL 16.5   11.1    Hemoglobin 13.0 - 17.0 g/dL 9.1   8.8    Hematocrit 39.0 - 52.0 % 28.4   28.5    Platelets 150 - 400 K/uL 377   364         View : No data to display.            Component Value Date/Time   RBC 4.38 03/08/2022 1330   MCV 64.8 (L) 03/08/2022 1330   MCH 20.8 (L) 03/08/2022 1330   MCHC 32.0 03/08/2022 1330   RDW 14.9 03/08/2022 1330   LYMPHSABS 0.8 03/08/2022 1330   MONOABS 0.6 03/08/2022 1330   EOSABS 0.1 03/08/2022 1330   BASOSABS 0.1 03/08/2022 1330    DIAGNOSTIC IMAGING:  I have independently reviewed the scans and discussed with the patient. No results found.   ASSESSMENT:  Neutrophilic leukocytosis and microcytic anemia: - Patient seen at the request of Dr. Macarthur Critchley for evaluation of leukocytosis and anemia. - CBCD (10/31/2021): WBC-15.4 (N 68%, L 16%, M 5%, E 8%, B 1%) Hb-10.3, MCV-61, PLT-452 - BCR/ABL PCR: Negative on 10/31/2021 - CBCD (09/14/2021): WBC-14.6 (elevated ANC and AEC), Hb-10.5, MCV 62, PLT-384 - CBCD (08/29/2021): WBC-13, Hb-11, MCV-65, PLT-395 (elevated ANC and AEC-0.5 - He  reports that he has been anemic all his life.  No prior history of blood transfusion.  No fevers or night sweats. - 22 pound weight loss since at least summer 2022.  Reports decreased appetite.  Reports bloating after eating. - In January 2023, he was hospitalized in [place] for bowel obstruction and bladder infection.  Nonsurgical  management.  He received IV iron x1. - He had colonoscopy x3, last one in the 5 years negative.    Social/family history: - He lives at home with his wife.  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  Quit smoking 38 years ago. - No family history of leukemia.  Brother had lung cancer.  Maternal grandmother had cancer.   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 - He was evaluated by Dr. Gloriann Loan at [facility].  His CT for hematuria work-up yesterday showed enhancing soft tissue density in the renal collecting system of the left kidney with left hydro ureteral nephrosis and several hypodense lesions in the liver worrisome for metastatic disease. - I have recommended PET CT scan for further evaluation. - I have recommended biopsy of the liver lesion based on CT scan from yesterday. - RTC after biopsy to discuss treatment options.  We will send NGS testing.  2.  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  The connective tissue disorder work-up was negative. - BCR/ABL by FISH from today is pending. - If BCR/ABL is negative, it could be malignancy related.  3.  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 - Feraheme on 01/18/2022 and 01/25/2022. - He reported improvement in energy levels. - CBC today shows hemoglobin 9.1  with MCV 64.  Percent saturation is 29 and ferritin is 110, up from 20 previously.  4.  Back pain: - Continue hydrocodone 5/325 twice daily.  Orders placed this encounter:  No orders of the defined types were placed in this encounter.    Derek Jack, MD [place] [phone]   I, Thana Ates, am acting as a scribe for Dr. Derek Jack.  I, Derek Jack MD, have reviewed the above documentation for accuracy and completeness, and I agree with the above.

## 2022-03-09 ENCOUNTER — Encounter (HOSPITAL_COMMUNITY): Payer: Self-pay

## 2022-03-09 NOTE — Progress Notes (Signed)
I met with the patient and his wife during and following initial visit with Dr. Delton Coombes. Contact information given and encouraged patient and family to call with questions or concerns.

## 2022-03-12 LAB — BCR-ABL1 FISH
Cells Analyzed: 200
Cells Counted: 200

## 2022-03-16 ENCOUNTER — Ambulatory Visit (HOSPITAL_COMMUNITY)
Admission: RE | Admit: 2022-03-16 | Discharge: 2022-03-16 | Disposition: A | Discharge status: Home / Self Care | Payer: MEDICARE | Source: Ambulatory Visit | Attending: Hematology | Admitting: Hematology

## 2022-03-16 DIAGNOSIS — N2889 Other specified disorders of kidney and ureter: Secondary | ICD-10-CM | POA: Insufficient documentation

## 2022-03-16 DIAGNOSIS — K769 Liver disease, unspecified: Secondary | ICD-10-CM | POA: Insufficient documentation

## 2022-03-16 DIAGNOSIS — Z85528 Personal history of other malignant neoplasm of kidney: Secondary | ICD-10-CM | POA: Insufficient documentation

## 2022-03-16 DIAGNOSIS — R160 Hepatomegaly, not elsewhere classified: Secondary | ICD-10-CM | POA: Insufficient documentation

## 2022-03-16 MED ORDER — FLUDEOXYGLUCOSE F - 18 (FDG) INJECTION
8.8700 | Freq: Once | INTRAVENOUS | Status: AC | PRN
  Administered 2022-03-16: 8.87 via INTRAVENOUS

## 2022-03-22 NOTE — Progress Notes (Unsigned)
Aletta Edouard, MD  Donita Brooks D PET is negative. No indication for biopsy. What are we being asked to biopsy?   GY

## 2022-03-23 ENCOUNTER — Telehealth (HOSPITAL_COMMUNITY): Payer: Self-pay | Admitting: Hematology

## 2022-03-23 ENCOUNTER — Inpatient Hospital Stay (HOSPITAL_COMMUNITY): Payer: MEDICARE | Attending: Hematology | Admitting: Hematology

## 2022-03-23 VITALS — BP 116/65 | HR 86 | Temp 98.8°F | Resp 18 | Ht 70.47 in | Wt 181.9 lb

## 2022-03-23 DIAGNOSIS — Z7952 Long term (current) use of systemic steroids: Secondary | ICD-10-CM | POA: Insufficient documentation

## 2022-03-23 DIAGNOSIS — G479 Sleep disorder, unspecified: Secondary | ICD-10-CM | POA: Diagnosis not present

## 2022-03-23 DIAGNOSIS — D72828 Other elevated white blood cell count: Secondary | ICD-10-CM | POA: Diagnosis not present

## 2022-03-23 DIAGNOSIS — K769 Liver disease, unspecified: Secondary | ICD-10-CM | POA: Diagnosis not present

## 2022-03-23 DIAGNOSIS — R319 Hematuria, unspecified: Secondary | ICD-10-CM | POA: Insufficient documentation

## 2022-03-23 DIAGNOSIS — R42 Dizziness and giddiness: Secondary | ICD-10-CM | POA: Insufficient documentation

## 2022-03-23 DIAGNOSIS — Z79899 Other long term (current) drug therapy: Secondary | ICD-10-CM | POA: Diagnosis not present

## 2022-03-23 DIAGNOSIS — D509 Iron deficiency anemia, unspecified: Secondary | ICD-10-CM | POA: Insufficient documentation

## 2022-03-23 DIAGNOSIS — Z801 Family history of malignant neoplasm of trachea, bronchus and lung: Secondary | ICD-10-CM | POA: Insufficient documentation

## 2022-03-23 DIAGNOSIS — M549 Dorsalgia, unspecified: Secondary | ICD-10-CM | POA: Diagnosis not present

## 2022-03-23 DIAGNOSIS — R32 Unspecified urinary incontinence: Secondary | ICD-10-CM | POA: Insufficient documentation

## 2022-03-23 DIAGNOSIS — F32A Depression, unspecified: Secondary | ICD-10-CM | POA: Insufficient documentation

## 2022-03-23 DIAGNOSIS — R519 Headache, unspecified: Secondary | ICD-10-CM | POA: Insufficient documentation

## 2022-03-23 DIAGNOSIS — R200 Anesthesia of skin: Secondary | ICD-10-CM | POA: Insufficient documentation

## 2022-03-23 DIAGNOSIS — N2889 Other specified disorders of kidney and ureter: Secondary | ICD-10-CM

## 2022-03-23 DIAGNOSIS — C642 Malignant neoplasm of left kidney, except renal pelvis: Secondary | ICD-10-CM | POA: Insufficient documentation

## 2022-03-23 DIAGNOSIS — R160 Hepatomegaly, not elsewhere classified: Secondary | ICD-10-CM

## 2022-03-23 DIAGNOSIS — C787 Secondary malignant neoplasm of liver and intrahepatic bile duct: Secondary | ICD-10-CM | POA: Diagnosis not present

## 2022-03-23 NOTE — Progress Notes (Unsigned)
Aletta Edouard, MD  Donita Brooks D I spoke w/ Dr. Nancie Neas biopsy request entirely.   GY

## 2022-03-23 NOTE — Telephone Encounter (Signed)
Scheduled appointment with Dr Gloriann Loan at [facility] for 03/29/22 @ 3:15. Appointment information provided to patient at checkout. - dd

## 2022-03-23 NOTE — Patient Instructions (Addendum)
[place] at [facility] Discharge Instructions  You were seen and examined today by Dr. Delton Coombes.  Dr. Delton Coombes discussed your PET scan. There is a mass in the pelvis of the left kidney concerning for cancer. There is no concern for lymph node or other organ involvement.  Dr. Delton Coombes reached out to Dr. Gloriann Loan yesterday and you should be hearing from that office. They will arrange for a biopsy and surgery as appropriate.  Please follow-up with Dr. Delton Coombes one week after the biopsy with Dr. Gloriann Loan.   Thank you for choosing [place] at [facility] to provide your oncology and hematology care.  To afford each patient quality time with our provider, please arrive at least 15 minutes before your scheduled appointment time.   If you have a lab appointment with the [facility] please come in thru the Main Entrance and check in at the main information desk.  You need to re-schedule your appointment should you arrive 10 or more minutes late.  We strive to give you quality time with our providers, and arriving late affects you and other patients whose appointments are after yours.  Also, if you no show three or more times for appointments you may be dismissed from the clinic at the providers discretion.     Again, thank you for choosing [facility].  Our hope is that these requests will decrease the amount of time that you wait before being seen by our physicians.       _____________________________________________________________  Should you have questions after your visit to [facility], please contact our office at [phone] and follow the prompts.  Our office hours are 8:00 a.m. and 4:30 p.m. Monday - Friday.  Please note that voicemails left after 4:00 p.m. may not be returned until the following business day.  We are closed weekends and major holidays.  You do have access to a nurse 24-7, just call the main number  to the clinic [phone] and do not press any options, hold on the line and a nurse will answer the phone.    For prescription refill requests, have your pharmacy contact our office and allow 72 hours.    Due to Covid, you will need to wear a mask upon entering the hospital. If you do not have a mask, a mask will be given to you at the Main Entrance upon arrival. For doctor visits, patients may have 1 support person age 84 or older with them. For treatment visits, patients can not have anyone with them due to social distancing guidelines and our immunocompromised population.

## 2022-03-23 NOTE — Progress Notes (Signed)
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 male, returns for routine follow-up for his neutrophilic leukocytosis and microcytic anemia. Joshua Osborne was last seen on 03/08/2022.  Today Joshua Osborne reports feeling good. Joshua Osborne reports episodes of urinary incontinence and 1 episode of hematuria at which time Joshua Osborne passed a blood clot.   REVIEW OF SYSTEMS:  Review of Systems  Constitutional:  Negative for appetite change and fatigue.  Genitourinary:  Positive for bladder incontinence and hematuria.   Neurological:  Positive for dizziness, headaches and numbness.  Psychiatric/Behavioral:  Positive for sleep disturbance.   All other systems reviewed and are negative.   PAST MEDICAL/SURGICAL HISTORY:  No past medical history on file. No past surgical history on file.  SOCIAL HISTORY:  Social History   Socioeconomic History   Marital status: Married    Spouse name: Not on file   Number of children: Not on file   Years of education: Not on file   Highest education level: Not on file  Occupational History   Not on file  Tobacco Use   Smoking status: Not on file   Smokeless tobacco: Not on file  Substance and Sexual Activity   Alcohol use: Not on file   Drug use: Not on file   Sexual activity: Not on file  Other Topics Concern   Not on file  Social History Narrative   Not on file   Social Determinants of Health   Financial Resource Strain: Not on file  Food Insecurity: Not on file  Transportation Needs: Not on file  Physical Activity: Not on file  Stress: Not on file  Social Connections: Not on file  Intimate Partner Violence: Not on file    FAMILY HISTORY:   No family history on file.  CURRENT MEDICATIONS:  Current Outpatient Medications  Medication Sig Dispense Refill   citalopram (CELEXA) 10 MG tablet Take by mouth.     HYDROcodone-acetaminophen (NORCO/VICODIN) 5-325 MG tablet Take 1 tablet by mouth 3 (three) times daily as needed.     latanoprost (XALATAN) 0.005 % ophthalmic solution 1 drop at bedtime.     metFORMIN (GLUCOPHAGE) 500 MG tablet Take by mouth 2 (two) times daily with a meal.     Multiple Vitamin (MULTI-VITAMIN DAILY PO) Take by mouth.     niacin 500 MG tablet Take 500 mg by mouth at bedtime.     omeprazole (PRILOSEC) 20 MG capsule Take by mouth.     ondansetron (ZOFRAN-ODT) 4 MG disintegrating tablet ondansetron 4 mg disintegrating tablet  DISSOLVE 1 TABLET IN MOUTH EVERY 6 HOURS AS NEEDED FOR NAUSEA     predniSONE (DELTASONE) 10 MG tablet prednisone 10 mg tablet  take 1 tab three times a day for 2 days, 1 tab twice a day for 5 days, 1 tab daily till finished     saw palmetto 500 MG capsule Take 500 mg by mouth daily.     simvastatin (ZOCOR) 40 MG tablet Take by mouth.     tamsulosin (FLOMAX) 0.4 MG CAPS capsule Take 0.4 mg by mouth.     tizanidine (ZANAFLEX) 2 MG capsule Take 2 mg by mouth 3 (three) times daily.     No current  facility-administered medications for this visit.    ALLERGIES:  No Known Allergies  PHYSICAL EXAM:  Performance status (ECOG): 1 - Symptomatic but completely ambulatory  There were no vitals filed for this visit. Wt Readings from Last 3 Encounters:  03/08/22 184 lb 6.4 oz (83.6 kg)  01/11/22 188 lb 8 oz (85.5 kg)  12/16/21 192 lb 1.6 oz (87.1 kg)   Physical Exam Vitals reviewed.  Constitutional:      Appearance: Normal appearance.  Cardiovascular:     Rate and Rhythm: Normal rate and regular rhythm.     Pulses: Normal pulses.     Heart sounds: Normal heart sounds.  Pulmonary:     Effort: Pulmonary effort is normal.     Breath sounds: Normal breath sounds.  Neurological:      General: No focal deficit present.     Mental Status: Joshua Osborne is alert and oriented to person, place, and time.  Psychiatric:        Mood and Affect: Mood normal.        Behavior: Behavior normal.     LABORATORY DATA:  I have reviewed the labs as listed.     Latest Ref Rng & Units 03/08/2022    1:30 PM 12/16/2021   11:10 AM  CBC  WBC 4.0 - 10.5 K/uL 16.5  11.1   Hemoglobin 13.0 - 17.0 g/dL 9.1  8.8   Hematocrit 39.0 - 52.0 % 28.4  28.5   Platelets 150 - 400 K/uL 377  364        No data to display            Component Value Date/Time   RBC 4.38 03/08/2022 1330   MCV 64.8 (L) 03/08/2022 1330   MCH 20.8 (L) 03/08/2022 1330   MCHC 32.0 03/08/2022 1330   RDW 14.9 03/08/2022 1330   LYMPHSABS 0.8 03/08/2022 1330   MONOABS 0.6 03/08/2022 1330   EOSABS 0.1 03/08/2022 1330   BASOSABS 0.1 03/08/2022 1330    DIAGNOSTIC IMAGING:  I have independently reviewed the scans and discussed with the patient. NM PET Image Initial (PI) Skull Base To Thigh (F-18 FDG)  Result Date: 03/18/2022 CLINICAL DATA:  Initial treatment strategy for liver lesion. History of renal cell carcinoma. Indeterminate renal lesion. EXAM: NUCLEAR MEDICINE PET SKULL BASE TO THIGH TECHNIQUE: 8.9 mCi F-18 FDG was injected intravenously. Full-ring PET imaging was performed from the skull base to thigh after the radiotracer. CT data was obtained and used for attenuation correction and anatomic localization. Fasting blood glucose: 179 mg/dl COMPARISON:  CT 03/07/2022 FINDINGS: Mediastinal blood pool activity: SUV max 2.5 Liver activity: SUV max NA NECK: No hypermetabolic lymph nodes in the neck. Incidental CT findings: none CHEST: No hypermetabolic mediastinal or hilar nodes. No suspicious pulmonary nodules on the CT scan. Incidental CT findings: none ABDOMEN/PELVIS: No abnormal metabolic activity in the liver. There are several low-density lesions with peripheral calcifications without metabolic activity the RIGHT hepatic lobe.  The soft tissue mass within the LEFT renal pelvis is hypermetabolic with SUV max equal 6.7 (image 458) No hypermetabolic abdominopelvic lymph nodes. Incidental CT findings: Prostate gland is enlarged. SKELETON: No focal hypermetabolic activity to suggest skeletal metastasis. Incidental CT findings: none IMPRESSION: 1. No metabolically active liver metastasis. Partially calcified lesions within the RIGHT hepatic lobe do not have metabolic activity. 2. There is hypermetabolic activity associated soft tissue mass within the LEFT renal collecting system consistent with carcinoma. 3. No skeletal metastasis. Electronically Signed   By: Suzy Bouchard  M.D.   On: 03/18/2022 21:19     ASSESSMENT:  Neutrophilic leukocytosis and microcytic anemia: - Patient seen at the request of Dr. Macarthur Critchley for evaluation of leukocytosis and anemia. - CBCD (10/31/2021): WBC-15.4 (N 68%, L 16%, M 5%, E 8%, B 1%) Hb-10.3, MCV-61, PLT-452 - BCR/ABL PCR: Negative on 10/31/2021 - CBCD (09/14/2021): WBC-14.6 (elevated ANC and AEC), Hb-10.5, MCV 62, PLT-384 - CBCD (08/29/2021): WBC-13, Hb-11, MCV-65, PLT-395 (elevated ANC and AEC-0.5 - Joshua Osborne reports that Joshua Osborne has been anemic all his life.  No prior history of blood transfusion.  No fevers or night sweats. - 22 pound weight loss since at least summer 2022.  Reports decreased appetite.  Reports bloating after eating. - In January 2023, Joshua Osborne was hospitalized in [place] for bowel obstruction and bladder infection.  Nonsurgical management.  Joshua Osborne received IV iron x1. - Joshua Osborne had colonoscopy x3, last one in the 5 years negative.    Social/family history: - Joshua Osborne lives at home with his wife.  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  Quit smoking 38 years ago. - No family history of leukemia.  Brother had lung cancer.  Maternal grandmother had cancer.   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 - Soft tissue mass within the left renal pelvis is hypermetabolic with SUV 6.7.  No hypermetabolic lymph nodes.  No liver lesions although there are several low-density lesions with peripheral calcifications suggestive of benign lesions. - I have communicated with Dr. Gloriann Loan.  Joshua Osborne will need ureteroscopy and biopsy. - If there is high-grade urothelial carcinoma, there is a role for neoadjuvant chemotherapy. - If urothelial carcinoma confirmed, Joshua Osborne will need nephroureterectomy with cuff of bladder removed and regional lymphadenectomy. - Joshua Osborne would like to come back to me to discuss further after biopsy confirms malignancy.  2.  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  Connective tissue disorder was negative. - Leukocytosis likely reactive to malignancy.  3.  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 - Feraheme on 01/18/2022 and 01/25/2022. - Last hemoglobin was 9.1.  4.  Back pain: - Continue hydrocodone 5/325 twice daily.  Orders placed this encounter:  No orders of the defined types were placed in this encounter.    Derek Jack, MD [facility] [phone]   I, Thana Ates, am acting as a scribe for Dr. Derek Jack.  I, Derek Jack MD, have reviewed the above documentation for accuracy and completeness, and I agree with the above.

## 2022-03-27 ENCOUNTER — Ambulatory Visit (HOSPITAL_COMMUNITY): Payer: MEDICARE | Admitting: Hematology

## 2022-04-04 ENCOUNTER — Inpatient Hospital Stay (HOSPITAL_BASED_OUTPATIENT_CLINIC_OR_DEPARTMENT_OTHER): Payer: MEDICARE | Admitting: Hematology

## 2022-04-04 ENCOUNTER — Inpatient Hospital Stay (HOSPITAL_COMMUNITY): Payer: MEDICARE

## 2022-04-04 VITALS — BP 129/68 | HR 83 | Temp 97.7°F | Resp 18 | Ht 71.26 in | Wt 184.2 lb

## 2022-04-04 DIAGNOSIS — C652 Malignant neoplasm of left renal pelvis: Secondary | ICD-10-CM

## 2022-04-04 DIAGNOSIS — R160 Hepatomegaly, not elsewhere classified: Secondary | ICD-10-CM

## 2022-04-04 DIAGNOSIS — C679 Malignant neoplasm of bladder, unspecified: Secondary | ICD-10-CM | POA: Diagnosis not present

## 2022-04-04 DIAGNOSIS — D509 Iron deficiency anemia, unspecified: Secondary | ICD-10-CM | POA: Diagnosis not present

## 2022-04-04 DIAGNOSIS — C642 Malignant neoplasm of left kidney, except renal pelvis: Secondary | ICD-10-CM | POA: Diagnosis not present

## 2022-04-04 LAB — CBC
HCT: 25.2 % — ABNORMAL LOW (ref 39.0–52.0)
Hemoglobin: 7.8 g/dL — ABNORMAL LOW (ref 13.0–17.0)
MCH: 20.4 pg — ABNORMAL LOW (ref 26.0–34.0)
MCHC: 31 g/dL (ref 30.0–36.0)
MCV: 65.8 fL — ABNORMAL LOW (ref 80.0–100.0)
Platelets: 378 10*3/uL (ref 150–400)
RBC: 3.83 MIL/uL — ABNORMAL LOW (ref 4.22–5.81)
RDW: 15.3 % (ref 11.5–15.5)
WBC: 12.7 10*3/uL — ABNORMAL HIGH (ref 4.0–10.5)
nRBC: 0 % (ref 0.0–0.2)

## 2022-04-04 LAB — IRON AND TIBC
Iron: 72 ug/dL (ref 45–182)
Saturation Ratios: 28 % (ref 17.9–39.5)
TIBC: 259 ug/dL (ref 250–450)
UIBC: 187 ug/dL

## 2022-04-04 LAB — FERRITIN: Ferritin: 71 ng/mL (ref 24–336)

## 2022-04-13 ENCOUNTER — Other Ambulatory Visit: Payer: Self-pay | Admitting: Urology

## 2022-04-14 ENCOUNTER — Inpatient Hospital Stay (HOSPITAL_COMMUNITY): Payer: MEDICARE | Attending: Hematology

## 2022-04-14 VITALS — BP 141/60 | HR 80 | Temp 98.1°F | Resp 18

## 2022-04-14 DIAGNOSIS — F32A Depression, unspecified: Secondary | ICD-10-CM | POA: Diagnosis not present

## 2022-04-14 DIAGNOSIS — Z79899 Other long term (current) drug therapy: Secondary | ICD-10-CM | POA: Diagnosis not present

## 2022-04-14 DIAGNOSIS — R160 Hepatomegaly, not elsewhere classified: Secondary | ICD-10-CM | POA: Diagnosis not present

## 2022-04-14 DIAGNOSIS — G479 Sleep disorder, unspecified: Secondary | ICD-10-CM | POA: Insufficient documentation

## 2022-04-14 DIAGNOSIS — D509 Iron deficiency anemia, unspecified: Secondary | ICD-10-CM

## 2022-04-14 DIAGNOSIS — D649 Anemia, unspecified: Secondary | ICD-10-CM | POA: Diagnosis not present

## 2022-04-14 DIAGNOSIS — R350 Frequency of micturition: Secondary | ICD-10-CM | POA: Diagnosis not present

## 2022-04-14 DIAGNOSIS — C642 Malignant neoplasm of left kidney, except renal pelvis: Secondary | ICD-10-CM | POA: Diagnosis present

## 2022-04-14 MED ORDER — SODIUM CHLORIDE 0.9% FLUSH
10.0000 mL | Freq: Once | INTRAVENOUS | Status: AC
  Administered 2022-04-14: 10 mL via INTRAVENOUS

## 2022-04-14 MED ORDER — SODIUM CHLORIDE 0.9 % IV SOLN
510.0000 mg | Freq: Once | INTRAVENOUS | Status: AC
  Administered 2022-04-14: 510 mg via INTRAVENOUS
  Filled 2022-04-14: qty 510

## 2022-04-14 MED ORDER — SODIUM CHLORIDE 0.9 % IV SOLN: INTRAVENOUS | Status: DC

## 2022-04-14 NOTE — Patient Instructions (Signed)
[facility]  Discharge Instructions: Thank you for choosing [facility] to provide your oncology and hematology care.  If you have a lab appointment with the Cancer Center, please come in thru the Main Entrance and check in at the main information desk.  Wear comfortable clothing and clothing appropriate for easy access to any Portacath or PICC line.   We strive to give you quality time with your provider. You may need to reschedule your appointment if you arrive late (15 or more minutes).  Arriving late affects you and other patients whose appointments are after yours.  Also, if you miss three or more appointments without notifying the office, you may be dismissed from the clinic at the provider's discretion.      For prescription refill requests, have your pharmacy contact our office and allow 72 hours for refills to be completed.         To help prevent nausea and vomiting after your treatment, we encourage you to take your nausea medication as directed.  BELOW ARE SYMPTOMS THAT SHOULD BE REPORTED IMMEDIATELY: *FEVER GREATER THAN 100.4 F (38 C) OR HIGHER *CHILLS OR SWEATING *NAUSEA AND VOMITING THAT IS NOT CONTROLLED WITH YOUR NAUSEA MEDICATION *UNUSUAL SHORTNESS OF BREATH *UNUSUAL BRUISING OR BLEEDING *URINARY PROBLEMS (pain or burning when urinating, or frequent urination) *BOWEL PROBLEMS (unusual diarrhea, constipation, pain near the anus) TENDERNESS IN MOUTH AND THROAT WITH OR WITHOUT PRESENCE OF ULCERS (sore throat, sores in mouth, or a toothache) UNUSUAL RASH, SWELLING OR PAIN  UNUSUAL VAGINAL DISCHARGE OR ITCHING   Items with * indicate a potential emergency and should be followed up as soon as possible or go to the Emergency Department if any problems should occur.  Please show the CHEMOTHERAPY ALERT CARD or IMMUNOTHERAPY ALERT CARD at check-in to the Emergency Department and triage nurse.  Should you have questions after your visit or need to  cancel or reschedule your appointment, please contact [facility] [phone]  and follow the prompts.  Office hours are 8:00 a.m. to 4:30 p.m. Monday - Friday. Please note that voicemails left after 4:00 p.m. may not be returned until the following business day.  We are closed weekends and major holidays. You have access to a nurse at all times for urgent questions. Please call the main number to the clinic [phone] and follow the prompts.  For any non-urgent questions, you may also contact your provider using MyChart. We now offer e-Visits for anyone 18 and older to request care online for non-urgent symptoms. For details visit [email or web address].   Also download the MyChart app! Go to the app store, search "MyChart", open the app, select [facility], and log in with your MyChart username and password.  Masks are optional in the cancer centers. If you would like for your care team to wear a mask while they are taking care of you, please let them know. For doctor visits, patients may have with them one support person who is at least 80 years old. At this time, visitors are not allowed in the infusion area.  

## 2022-04-14 NOTE — Progress Notes (Signed)
Patient presents today for Feraheme infusion.  Patient is in stable condition with only complaints of fatigue.  Vital signs are stable. We will proceed with treatment per physician orders.    Patient tolerated infusion well with no complaints voiced.  Patient left ambulatory in stable condition.  Vital signs stable at discharge.  Follow up as scheduled.

## 2022-04-21 ENCOUNTER — Inpatient Hospital Stay (HOSPITAL_COMMUNITY)
Admission: EM | Admit: 2022-04-21 | Discharge: 2022-04-28 | DRG: 853 | Disposition: A | Discharge status: Home / Self Care | Payer: MEDICARE | Attending: Family Medicine | Admitting: Family Medicine

## 2022-04-21 ENCOUNTER — Other Ambulatory Visit: Payer: Self-pay

## 2022-04-21 ENCOUNTER — Encounter (HOSPITAL_COMMUNITY): Payer: Self-pay | Admitting: Emergency Medicine

## 2022-04-21 ENCOUNTER — Inpatient Hospital Stay (HOSPITAL_COMMUNITY): Payer: MEDICARE

## 2022-04-21 ENCOUNTER — Encounter (HOSPITAL_COMMUNITY): Payer: Self-pay

## 2022-04-21 ENCOUNTER — Emergency Department (HOSPITAL_COMMUNITY): Payer: MEDICARE

## 2022-04-21 DIAGNOSIS — Z87891 Personal history of nicotine dependence: Secondary | ICD-10-CM

## 2022-04-21 DIAGNOSIS — N10 Acute tubulo-interstitial nephritis: Secondary | ICD-10-CM | POA: Diagnosis not present

## 2022-04-21 DIAGNOSIS — J9601 Acute respiratory failure with hypoxia: Secondary | ICD-10-CM | POA: Diagnosis present

## 2022-04-21 DIAGNOSIS — E119 Type 2 diabetes mellitus without complications: Secondary | ICD-10-CM | POA: Diagnosis present

## 2022-04-21 DIAGNOSIS — R109 Unspecified abdominal pain: Secondary | ICD-10-CM | POA: Diagnosis not present

## 2022-04-21 DIAGNOSIS — N1330 Unspecified hydronephrosis: Secondary | ICD-10-CM | POA: Diagnosis not present

## 2022-04-21 DIAGNOSIS — D72829 Elevated white blood cell count, unspecified: Secondary | ICD-10-CM | POA: Diagnosis present

## 2022-04-21 DIAGNOSIS — N179 Acute kidney failure, unspecified: Secondary | ICD-10-CM | POA: Diagnosis present

## 2022-04-21 DIAGNOSIS — N390 Urinary tract infection, site not specified: Principal | ICD-10-CM

## 2022-04-21 DIAGNOSIS — C672 Malignant neoplasm of lateral wall of bladder: Secondary | ICD-10-CM | POA: Diagnosis not present

## 2022-04-21 DIAGNOSIS — C679 Malignant neoplasm of bladder, unspecified: Secondary | ICD-10-CM | POA: Diagnosis present

## 2022-04-21 DIAGNOSIS — Z7984 Long term (current) use of oral hypoglycemic drugs: Secondary | ICD-10-CM | POA: Diagnosis not present

## 2022-04-21 DIAGNOSIS — E782 Mixed hyperlipidemia: Secondary | ICD-10-CM | POA: Diagnosis present

## 2022-04-21 DIAGNOSIS — E861 Hypovolemia: Secondary | ICD-10-CM | POA: Diagnosis present

## 2022-04-21 DIAGNOSIS — D649 Anemia, unspecified: Secondary | ICD-10-CM | POA: Diagnosis present

## 2022-04-21 DIAGNOSIS — R6521 Severe sepsis with septic shock: Secondary | ICD-10-CM | POA: Diagnosis present

## 2022-04-21 DIAGNOSIS — N136 Pyonephrosis: Secondary | ICD-10-CM | POA: Diagnosis present

## 2022-04-21 DIAGNOSIS — C652 Malignant neoplasm of left renal pelvis: Secondary | ICD-10-CM | POA: Diagnosis present

## 2022-04-21 DIAGNOSIS — Z79899 Other long term (current) drug therapy: Secondary | ICD-10-CM

## 2022-04-21 DIAGNOSIS — G893 Neoplasm related pain (acute) (chronic): Secondary | ICD-10-CM | POA: Diagnosis present

## 2022-04-21 DIAGNOSIS — A419 Sepsis, unspecified organism: Secondary | ICD-10-CM | POA: Diagnosis present

## 2022-04-21 DIAGNOSIS — D630 Anemia in neoplastic disease: Secondary | ICD-10-CM | POA: Diagnosis not present

## 2022-04-21 DIAGNOSIS — F32A Depression, unspecified: Secondary | ICD-10-CM | POA: Diagnosis present

## 2022-04-21 DIAGNOSIS — N135 Crossing vessel and stricture of ureter without hydronephrosis: Secondary | ICD-10-CM | POA: Diagnosis not present

## 2022-04-21 HISTORY — DX: Malignant (primary) neoplasm, unspecified: C80.1

## 2022-04-21 HISTORY — DX: Neoplasm of unspecified behavior of bladder: D49.4

## 2022-04-21 HISTORY — DX: Type 2 diabetes mellitus without complications: E11.9

## 2022-04-21 LAB — COMPREHENSIVE METABOLIC PANEL
ALT: 47 U/L — ABNORMAL HIGH (ref 0–44)
AST: 32 U/L (ref 15–41)
Albumin: 3.7 g/dL (ref 3.5–5.0)
Alkaline Phosphatase: 157 U/L — ABNORMAL HIGH (ref 38–126)
Anion gap: 10 (ref 5–15)
BUN: 32 mg/dL — ABNORMAL HIGH (ref 8–23)
CO2: 23 mmol/L (ref 22–32)
Calcium: 9 mg/dL (ref 8.9–10.3)
Chloride: 100 mmol/L (ref 98–111)
Creatinine, Ser: 1.76 mg/dL — ABNORMAL HIGH (ref 0.61–1.24)
GFR, Estimated: 39 mL/min — ABNORMAL LOW (ref >60)
Glucose, Bld: 183 mg/dL — ABNORMAL HIGH (ref 70–99)
Potassium: 4.5 mmol/L (ref 3.5–5.1)
Sodium: 133 mmol/L — ABNORMAL LOW (ref 135–145)
Total Bilirubin: 1.3 mg/dL — ABNORMAL HIGH (ref 0.3–1.2)
Total Protein: 7.7 g/dL (ref 6.5–8.1)

## 2022-04-21 LAB — CBC WITH DIFFERENTIAL/PLATELET
Abs Immature Granulocytes: 0.81 10*3/uL — ABNORMAL HIGH (ref 0.00–0.07)
Basophils Absolute: 0.1 10*3/uL (ref 0.0–0.1)
Basophils Relative: 0 %
Eosinophils Absolute: 0 10*3/uL (ref 0.0–0.5)
Eosinophils Relative: 0 %
HCT: 26.2 % — ABNORMAL LOW (ref 39.0–52.0)
Hemoglobin: 8.4 g/dL — ABNORMAL LOW (ref 13.0–17.0)
Immature Granulocytes: 2 %
Lymphocytes Relative: 1 %
Lymphs Abs: 0.4 10*3/uL — ABNORMAL LOW (ref 0.7–4.0)
MCH: 20.6 pg — ABNORMAL LOW (ref 26.0–34.0)
MCHC: 32.1 g/dL (ref 30.0–36.0)
MCV: 64.2 fL — ABNORMAL LOW (ref 80.0–100.0)
Monocytes Absolute: 2 10*3/uL — ABNORMAL HIGH (ref 0.1–1.0)
Monocytes Relative: 6 %
Neutro Abs: 31.1 10*3/uL — ABNORMAL HIGH (ref 1.7–7.7)
Neutrophils Relative %: 91 %
Platelets: 366 10*3/uL (ref 150–400)
RBC: 4.08 MIL/uL — ABNORMAL LOW (ref 4.22–5.81)
RDW: 15 % (ref 11.5–15.5)
WBC: 34.4 10*3/uL — ABNORMAL HIGH (ref 4.0–10.5)
nRBC: 0 % (ref 0.0–0.2)

## 2022-04-21 LAB — URINALYSIS, ROUTINE W REFLEX MICROSCOPIC
Bilirubin Urine: NEGATIVE
Glucose, UA: NEGATIVE mg/dL
Ketones, ur: 20 mg/dL — AB
Nitrite: NEGATIVE
Protein, ur: 100 mg/dL — AB
Specific Gravity, Urine: 1.018 (ref 1.005–1.030)
WBC, UA: >50 WBC/hpf — ABNORMAL HIGH (ref 0–5)
pH: 5 (ref 5.0–8.0)

## 2022-04-21 LAB — HEMOGLOBIN A1C
Hgb A1c MFr Bld: 5.9 % — ABNORMAL HIGH (ref 4.8–5.6)
Mean Plasma Glucose: 122.63 mg/dL

## 2022-04-21 LAB — MRSA NEXT GEN BY PCR, NASAL: MRSA by PCR Next Gen: NOT DETECTED

## 2022-04-21 LAB — PROTIME-INR
INR: 1.2 (ref 0.8–1.2)
Prothrombin Time: 14.7 seconds (ref 11.4–15.2)

## 2022-04-21 LAB — LACTIC ACID, PLASMA: Lactic Acid, Venous: 1.2 mmol/L (ref 0.5–1.9)

## 2022-04-21 LAB — PROCALCITONIN: Procalcitonin: 0.7 ng/mL

## 2022-04-21 LAB — GLUCOSE, CAPILLARY
Glucose-Capillary: 184 mg/dL — ABNORMAL HIGH (ref 70–99)
Glucose-Capillary: 157 mg/dL — ABNORMAL HIGH (ref 70–99)

## 2022-04-21 MED ORDER — LATANOPROST 0.005 % OP SOLN
1.0000 [drp] | Freq: Every day | OPHTHALMIC | Status: DC
  Administered 2022-04-21 – 2022-04-27 (×7): 1 [drp] via OPHTHALMIC
  Filled 2022-04-21: qty 2.5

## 2022-04-21 MED ORDER — PANTOPRAZOLE SODIUM 40 MG PO TBEC
40.0000 mg | DELAYED_RELEASE_TABLET | Freq: Every day | ORAL | Status: DC
  Administered 2022-04-22 – 2022-04-28 (×7): 40 mg via ORAL
  Filled 2022-04-21 (×7): qty 1

## 2022-04-21 MED ORDER — TAMSULOSIN HCL 0.4 MG PO CAPS
0.4000 mg | ORAL_CAPSULE | Freq: Every day | ORAL | Status: DC
  Administered 2022-04-22 – 2022-04-28 (×7): 0.4 mg via ORAL
  Filled 2022-04-21 (×7): qty 1

## 2022-04-21 MED ORDER — ONDANSETRON HCL 4 MG/2ML IJ SOLN: 4.0000 mg | Freq: Four times a day (QID) | INTRAMUSCULAR | Status: DC | PRN

## 2022-04-21 MED ORDER — ORAL CARE MOUTH RINSE: 15.0000 mL | OROMUCOSAL | Status: DC | PRN

## 2022-04-21 MED ORDER — SODIUM CHLORIDE 0.9 % IV BOLUS
30.0000 mL/kg | Freq: Once | INTRAVENOUS | Status: AC
  Administered 2022-04-21: 2508 mL via INTRAVENOUS

## 2022-04-21 MED ORDER — ONDANSETRON HCL 4 MG PO TABS: 4.0000 mg | ORAL_TABLET | Freq: Four times a day (QID) | ORAL | Status: DC | PRN

## 2022-04-21 MED ORDER — SODIUM CHLORIDE 0.9 % IV SOLN: INTRAVENOUS | Status: DC

## 2022-04-21 MED ORDER — IOHEXOL 300 MG/ML  SOLN
75.0000 mL | Freq: Once | INTRAMUSCULAR | Status: AC | PRN
  Administered 2022-04-21: 75 mL via INTRAVENOUS

## 2022-04-21 MED ORDER — ENSURE ENLIVE PO LIQD
237.0000 mL | Freq: Two times a day (BID) | ORAL | Status: DC
  Administered 2022-04-22 (×2): 237 mL via ORAL

## 2022-04-21 MED ORDER — OXYCODONE-ACETAMINOPHEN 5-325 MG PO TABS
1.0000 | ORAL_TABLET | Freq: Four times a day (QID) | ORAL | Status: DC | PRN
  Administered 2022-04-21 – 2022-04-22 (×4): 1 via ORAL
  Filled 2022-04-21 (×4): qty 1

## 2022-04-21 MED ORDER — INSULIN ASPART 100 UNIT/ML IJ SOLN
2.0000 [IU] | Freq: Once | INTRAMUSCULAR | Status: AC
Start: 2022-04-21 — End: 2022-04-21
  Administered 2022-04-21: 2 [IU] via SUBCUTANEOUS

## 2022-04-21 MED ORDER — CITALOPRAM HYDROBROMIDE 10 MG PO TABS
10.0000 mg | ORAL_TABLET | Freq: Every day | ORAL | Status: DC
  Administered 2022-04-22 – 2022-04-28 (×7): 10 mg via ORAL
  Filled 2022-04-21 (×7): qty 1

## 2022-04-21 MED ORDER — SODIUM CHLORIDE 0.9 % IV SOLN
1.0000 g | Freq: Once | INTRAVENOUS | Status: AC
  Administered 2022-04-21: 1 g via INTRAVENOUS
  Filled 2022-04-21: qty 10

## 2022-04-21 MED ORDER — ACETAMINOPHEN 650 MG RE SUPP: 650.0000 mg | Freq: Four times a day (QID) | RECTAL | Status: DC | PRN

## 2022-04-21 MED ORDER — NIACIN 500 MG PO TABS
500.0000 mg | ORAL_TABLET | Freq: Every day | ORAL | Status: DC
Start: 2022-04-21 — End: 2022-04-28
  Administered 2022-04-21 – 2022-04-27 (×7): 500 mg via ORAL
  Filled 2022-04-21 (×10): qty 1

## 2022-04-21 MED ORDER — TIZANIDINE HCL 2 MG PO TABS
2.0000 mg | ORAL_TABLET | Freq: Three times a day (TID) | ORAL | Status: DC
Start: 2022-04-21 — End: 2022-04-21

## 2022-04-21 MED ORDER — NIACIN ER 500 MG PO CPCR
500.0000 mg | ORAL_CAPSULE | Freq: Every day | ORAL | Status: DC
  Filled 2022-04-21 (×3): qty 1

## 2022-04-21 MED ORDER — ACETAMINOPHEN 325 MG PO TABS
650.0000 mg | ORAL_TABLET | Freq: Once | ORAL | Status: AC
  Administered 2022-04-21: 650 mg via ORAL
  Filled 2022-04-21: qty 2

## 2022-04-21 MED ORDER — ACETAMINOPHEN 325 MG PO TABS
650.0000 mg | ORAL_TABLET | Freq: Four times a day (QID) | ORAL | Status: DC | PRN
  Administered 2022-04-21 – 2022-04-25 (×6): 650 mg via ORAL
  Filled 2022-04-21 (×6): qty 2

## 2022-04-21 MED ORDER — INSULIN ASPART 100 UNIT/ML IJ SOLN
0.0000 [IU] | Freq: Three times a day (TID) | INTRAMUSCULAR | Status: DC
  Administered 2022-04-22: 1 [IU] via SUBCUTANEOUS
  Administered 2022-04-22 – 2022-04-23 (×4): 2 [IU] via SUBCUTANEOUS
  Administered 2022-04-23: 1 [IU] via SUBCUTANEOUS
  Administered 2022-04-24: 2 [IU] via SUBCUTANEOUS
  Administered 2022-04-24 – 2022-04-25 (×5): 1 [IU] via SUBCUTANEOUS
  Administered 2022-04-26 (×2): 3 [IU] via SUBCUTANEOUS
  Administered 2022-04-27: 2 [IU] via SUBCUTANEOUS
  Administered 2022-04-27: 1 [IU] via SUBCUTANEOUS
  Administered 2022-04-27: 2 [IU] via SUBCUTANEOUS
  Administered 2022-04-28: 3 [IU] via SUBCUTANEOUS

## 2022-04-21 MED ORDER — ENOXAPARIN SODIUM 40 MG/0.4ML IJ SOSY
40.0000 mg | PREFILLED_SYRINGE | INTRAMUSCULAR | Status: DC
  Administered 2022-04-21 – 2022-04-27 (×7): 40 mg via SUBCUTANEOUS
  Filled 2022-04-21 (×7): qty 0.4

## 2022-04-21 MED ORDER — CHLORHEXIDINE GLUCONATE CLOTH 2 % EX PADS
6.0000 | MEDICATED_PAD | Freq: Every day | CUTANEOUS | Status: DC
  Administered 2022-04-21 – 2022-04-26 (×7): 6 via TOPICAL

## 2022-04-21 MED ORDER — TIZANIDINE HCL 2 MG PO TABS
2.0000 mg | ORAL_TABLET | Freq: Every day | ORAL | Status: DC
  Administered 2022-04-21 – 2022-04-27 (×7): 2 mg via ORAL
  Filled 2022-04-21 (×7): qty 1

## 2022-04-21 MED ORDER — SODIUM CHLORIDE 0.9 % IV SOLN
2.0000 g | INTRAVENOUS | Status: DC
  Administered 2022-04-22: 2 g via INTRAVENOUS
  Filled 2022-04-21: qty 20

## 2022-04-21 NOTE — ED Provider Notes (Signed)
[facility] EMERGENCY DEPARTMENT Provider Note   CSN: [id number] Arrival date & time: 04/21/22  1013     History  Chief Complaint  Patient presents with   Chills    Joshua Osborne is a 81 y.o. male presenting to the ED with generalized weakness.  He reports is been ongoing for about 2 weeks.  He reports fevers and shaking chills at home.  He says he was seen by his urologist yesterday and told that he may have a urinary infection and started on cephalexin.  He is only taken 1 or 2 doses.  He says he felt very weak today, his heart was racing.  He does not have a port or indwelling line.  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  He is not currently on chemo or radiation treatment.  HPI     Home Medications Prior to Admission medications   Medication Sig Start Date End Date Taking? Authorizing Provider  citalopram (CELEXA) 10 MG tablet Take 10 mg by mouth daily.   Yes [provider]  fluorouracil (EFUDEX) 5 % cream Apply 1 Application topically 2 (two) times daily.   Yes [provider]  latanoprost (XALATAN) 0.005 % ophthalmic solution Place 1 drop into both eyes at bedtime. 12/19/21  Yes [provider]  metFORMIN (GLUCOPHAGE) 500 MG tablet Take 500 mg by mouth 2 (two) times daily with a meal.   Yes [provider]  Multiple Vitamin (MULTI-VITAMIN DAILY PO) Take 1 capsule by mouth daily.   Yes [provider]  niacin 500 MG tablet Take 500 mg by mouth at bedtime.   Yes [provider]  omeprazole (PRILOSEC) 20 MG capsule Take 20 mg by mouth daily.   Yes [provider]  PERCOCET 5-325 MG tablet Take 1 tablet by mouth every 4 (four) hours as needed. 04/20/22  Yes [provider]  saw palmetto 500 MG capsule Take 500 mg by mouth 2 (two) times daily.   Yes [provider]  simvastatin (ZOCOR) 40 MG  tablet Take 40 mg by mouth daily.   Yes [provider]  tamsulosin (FLOMAX) 0.4 MG CAPS capsule Take 0.4 mg by mouth daily.   Yes [provider]  tizanidine (ZANAFLEX) 2 MG capsule Take 2 mg by mouth 3 (three) times daily.   Yes [provider]  HYDROcodone-acetaminophen (NORCO/VICODIN) 5-325 MG tablet Take 1 tablet by mouth 3 (three) times daily as needed. Patient not taking: Reported on 04/21/2022 12/01/21   [provider]      Allergies    Patient has no known allergies.    Review of Systems   Review of Systems  Physical Exam Updated Vital Signs BP (!) 116/53   Pulse (!) 110   Temp 99 F (37.2 C) (Oral)   Resp 16   Ht 6' (1.829 m)   Wt 82.3 kg   SpO2 97%   BMI 24.61 kg/m  Physical Exam Constitutional:      General: He is not in acute distress. HENT:     Head: Normocephalic and atraumatic.  Eyes:     Conjunctiva/sclera: Conjunctivae normal.     Pupils: Pupils are equal, round, and reactive to light.  Cardiovascular:     Rate and Rhythm: Normal rate and regular rhythm.  Pulmonary:     Effort: Pulmonary effort is normal. No respiratory distress.  Abdominal:  General: There is no distension.     Tenderness: There is no abdominal tenderness.  Skin:    General: Skin is warm and dry.  Neurological:     General: No focal deficit present.     Mental Status: He is alert. Mental status is at baseline.  Psychiatric:        Mood and Affect: Mood normal.        Behavior: Behavior normal.     ED Results / Procedures / Treatments   Labs (all labs ordered are listed, but only abnormal results are displayed) Labs Reviewed  COMPREHENSIVE METABOLIC PANEL - Abnormal; Notable for the following components:      Result Value   Sodium 133 (*)    Glucose, Bld 183 (*)    BUN 32 (*)    Creatinine, Ser 1.76 (*)    ALT 47 (*)    Alkaline Phosphatase 157 (*)    Total Bilirubin 1.3 (*)    GFR, Estimated 39 (*)    All other components within  normal limits  CBC WITH DIFFERENTIAL/PLATELET - Abnormal; Notable for the following components:   WBC 34.4 (*)    RBC 4.08 (*)    Hemoglobin 8.4 (*)    HCT 26.2 (*)    MCV 64.2 (*)    MCH 20.6 (*)    Neutro Abs 31.1 (*)    Lymphs Abs 0.4 (*)    Monocytes Absolute 2.0 (*)    Abs Immature Granulocytes 0.81 (*)    All other components within normal limits  URINALYSIS, ROUTINE W REFLEX MICROSCOPIC - Abnormal; Notable for the following components:   APPearance CLOUDY (*)    Hgb urine dipstick MODERATE (*)    Ketones, ur 20 (*)    Protein, ur 100 (*)    Leukocytes,Ua MODERATE (*)    WBC, UA >50 (*)    Bacteria, UA RARE (*)    All other components within normal limits  CULTURE, BLOOD (ROUTINE X 2)  CULTURE, BLOOD (ROUTINE X 2)  URINE CULTURE  MRSA NEXT GEN BY PCR, NASAL  LACTIC ACID, PLASMA  PROTIME-INR  PROCALCITONIN  HEMOGLOBIN A1C    EKG None  Radiology CT ABDOMEN PELVIS W CONTRAST  Result Date: 04/21/2022 CLINICAL DATA:  LEFT flank pain and LEFT lower quadrant pain, fever, and chills for the past few days, suspected kidney stone; history of renal cell carcinoma, diverticulitis, pyelonephritis EXAM: CT ABDOMEN AND PELVIS WITH CONTRAST TECHNIQUE: Multidetector CT imaging of the abdomen and pelvis was performed using the standard protocol following bolus administration of intravenous contrast. RADIATION DOSE REDUCTION: This exam was performed according to the departmental dose-optimization program which includes automated exposure control, adjustment of the mA and/or kV according to patient size and/or use of iterative reconstruction technique. CONTRAST:  24m OMNIPAQUE IOHEXOL 300 MG/ML SOLN IV. No oral contrast. COMPARISON:  03/07/2022 Correlation: PET-CT 03/16/2022 FINDINGS: Lower chest: Lung bases clear Hepatobiliary: Calcifications with surrounding low attenuation in the RIGHT lobe of the liver question treated metastases. Post cholecystectomy. No new hepatic abnormalities.  Pancreas: Atrophic pancreas without mass Spleen: Normal appearance Adrenals/Urinary Tract: Adrenal glands and RIGHT kidney normal appearance. Again identified soft tissue within LEFT renal collecting system consistent with known tumor, extending to the reader 0 pelvic junction and causing increased LEFT hydronephrosis. Nonobstructing LEFT renal calculi. LEFT ureter is dilated in its course to the urinary bladder without calcification or mass. RIGHT ureter decompressed. Bladder unremarkable. Stomach/Bowel: Normal appendix. Diverticulosis of descending and sigmoid colon without evidence of diverticulitis.  Stomach and remaining bowel loops unremarkable. Vascular/Lymphatic: Atherosclerotic calcifications aorta and iliac arteries without aneurysm. Vascular structures patent. No adenopathy. Reproductive: Significant prostatic enlargement, gland measuring 7.2 x 6.0 x 5.7 cm (volume = 130 cm^3). Seminal vesicles unremarkable. Other: No free air or free fluid. No hernia or inflammatory process. 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 Significant prostatic enlargement. Distal colonic diverticulosis without evidence of diverticulitis. 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 Aortic Atherosclerosis (ICD10-I70.0). Electronically Signed   By: Lavonia Dana M.D.   On: 04/21/2022 12:49   DG Chest 2 View  Result Date: 04/21/2022 CLINICAL DATA:  Suspected sepsis EXAM: CHEST - 2 VIEW COMPARISON:  None Available. FINDINGS: The heart size and mediastinal contours are within normal limits. Both lungs are clear. The visualized skeletal structures are unremarkable. IMPRESSION: No active cardiopulmonary disease. Electronically Signed   By: Dorise Bullion III M.D.   On: 04/21/2022  11:30    Procedures .Critical Care  Performed by: Wyvonnia Dusky, MD Authorized by: Wyvonnia Dusky, MD   Critical care provider statement:    Critical care time (minutes):  35   Critical care time was exclusive of:  Separately billable procedures and treating other patients   Critical care was necessary to treat or prevent imminent or life-threatening deterioration of the following conditions:  Sepsis   Critical care was time spent personally by me on the following activities:  Ordering and performing treatments and interventions, ordering and review of laboratory studies, ordering and review of radiographic studies, pulse oximetry, review of old charts, examination of patient and evaluation of patient's response to treatment   Care discussed with: admitting provider       Medications Ordered in ED Medications  oxyCODONE-acetaminophen (PERCOCET/ROXICET) 5-325 MG per tablet 1 tablet (1 tablet Oral Given 04/21/22 1438)  Chlorhexidine Gluconate Cloth 2 % PADS 6 each (6 each Topical Given 04/21/22 1554)  cefTRIAXone (ROCEPHIN) 1 g in sodium chloride 0.9 % 100 mL IVPB (0 g Intravenous Stopped 04/21/22 1231)  sodium chloride 0.9 % bolus 2,508 mL (0 mLs Intravenous Stopped 04/21/22 1444)  iohexol (OMNIPAQUE) 300 MG/ML solution 75 mL (75 mLs Intravenous Contrast Given 04/21/22 1219)  acetaminophen (TYLENOL) tablet 650 mg (650 mg Oral Given 04/21/22 1233)  cefTRIAXone (ROCEPHIN) 1 g in sodium chloride 0.9 % 100 mL IVPB (0 g Intravenous Stopped 04/21/22 1530)    ED Course/ Medical Decision Making/ A&P Clinical Course as of 04/21/22 1630  Fri Apr 21, 2022  1156 Chalmers GuestMarland Kitchen): MODERATE [MT]  0086 Admitted to hospitalist [MT]    Clinical Course User Index [MT] Abeer Iversen, Carola Rhine, MD                           Medical Decision Making Amount and/or Complexity of Data Reviewed Labs: ordered. Decision-making details documented in ED Course. Radiology: ordered.  Risk OTC drugs. Prescription  drug management. Decision regarding hospitalization.   This patient presents to the ED with concern for shaking chills. This involves an extensive number of treatment options, and is a complaint that carries with it a high risk of complications and morbidity.  The differential diagnosis includes UTI versus colitis versus diverticulitis versus other intra-abdominal infection or complication versus pyelonephritis versus  Co-morbidities that complicate the patient evaluation: History of renal cell carcinoma  Additional history  obtained from patient's wife  External records from outside source obtained and reviewed including PET scan June 2023 reporting calcified lesions within the right hepatic lobe that did not have metabolic activity, and hypermetabolic activity with soft tissue mass in the left renal collecting system consistent with carcinoma  I ordered and personally interpreted labs.  The pertinent results include: UA with leukocytes, could be consistent with UTI.  Urine culture will be sent.  I ordered imaging studies including CT abdomen pelvis with contrast.  Ultimately, with likely source may remain a UTI, I felt a CT scan to evaluate for other causes of left lower quadrant left flank pain, such as colitis, diverticulitis, would be reasonable, given his degree of leukocytosis, and his sepsis criteria.  I also ordered and reviewed x-ray imaging of the chest  I independently visualized and interpreted imaging which showed no focal chest abnormalities; CT abdomen showing left sided renal mass consistent with malignency I agree with the radiologist interpretation  The patient was maintained on a cardiac monitor.  I personally viewed and interpreted the cardiac monitored which showed an underlying rhythm of: Sinus tachycardia  Per my interpretation the patient's ECG shows sinus tachycardia with no ischemic finding  I ordered medication including IV Rocephin for suspected urinary infection,  gram-negative coverage, and sepsis fluid bolus 30 cc/kg of normal saline.  Test Considered: I doubt acute pulmonary embolism at this presentation, or meningitis.  Do not feel that CT PE or LP was indicated.  After the interventions noted above, I reevaluated the patient and found that they have: improved  BP stable, HR improving with fluids, fever curve improved   Dispostion:  After consideration of the diagnostic results and the patients response to treatment, I feel that the patent would benefit from medical admission for suspected urosepsis.          Final Clinical Impression(s) / ED Diagnoses Final diagnoses:  Urinary tract infection without hematuria, site unspecified  Sepsis, due to unspecified organism, unspecified whether acute organ dysfunction present [facility])    Rx / DC Orders ED Discharge Orders     None         Jabin Tapp, Carola Rhine, MD 04/21/22 1631

## 2022-04-21 NOTE — Hospital Course (Addendum)
81 year old male with a history of depression, diabetes mellitus, urothelial carcinoma, hyperlipidemia presenting with 2-week history of generalized weakness.  In the past couple days, the patient has had fevers and chills.  He actually had shivering and rigors on the morning of 04/21/2022.  He has had poor oral intake.  His wife took his temperature on the morning of 04/21/2022 and it was 100.0 F.  He went to see his urologist, Dr. Lovena Neighbours, on 04/20/2022.  He was diagnosed with a UTI and started on cephalexin.  He took 2 doses prior to coming to the hospital.  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  He is scheduled tentatively for a laparoscopic left-sided nephroureterectomy on 05/30/2022.  He had 2 episodes of nausea and vomiting prior to coming to the hospital on the morning of admission.  He denies any abdominal pain but complains of left-sided flank pain which has worsened over the last couple days prior to admission.  He has been taking oxycodone 5 mg approximately twice daily since his renal biopsy.  He denies any headache, neck pain, chest pain, shortness of breath, cough, hemoptysis.  There is no diarrhea, hematochezia, melena, abdominal pain.  He has some intermittent dysuria. In the ED, the patient was febrile up to 101.7 F.  He was tachycardic into the 140s.  He was hemodynamically stable.  WBC 34.4, hemoglobin 8.4, platelets 266,000.  Sodium 133, potassium 4.5, bicarbonate 23, serum creatinine 1.76.  AST 32, ALT 47, ALP 157, total bilirubin 1.3.  UA shows greater than 50 WBC, 21-50 RBC.  Lactic acid 1.2.  CT of the abdomen and pelvis showed increased left hydronephrosis with hydroureter secondary to persistent soft tissue within the left renal collecting system consistent with tumor extending to the UPJ.  There is nonobstructive left renal calculi.  The patient was given the appropriate fluid boluses and started on ceftriaxone.  He was  admitted for further evaluation and treatment.  04/26/2022: left ureteral stent placement by Dr. Alyson Ingles  04/28/2022: remains afebrile, foley removed, DC home today, outpatient follow up with Dr. Alyson Ingles around 05/03/22 scheduled

## 2022-04-21 NOTE — Assessment & Plan Note (Signed)
Presented with fever, tachycardia, leukocytosis Required low dose levophed overnight 7/14-7/15>>weaned off Lactate peaked 1.2 UA>50 WBC Follow blood and urine cultures--negative to date but pt was on cephalexin prior to admission CXR--no infiltrates Antibiotics broaden to zosyn WBC up to 41.6>>32.2>>22.4>>21.0 Continue IVF>>saline lock on 7/17 due to fluid overload -still continues to have fever up to 103.1 on 7/17

## 2022-04-21 NOTE — Assessment & Plan Note (Signed)
-  PET scan (03/16/2022): Soft tissue mass within the left renal pelvis, SUV 6.7.  No hypermetabolic lymph nodes.  No liver lesions hypermetabolic. -He underwent ureteroscopy and biopsy on 03/27/2022. 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  Muscularis propria not identified in the current specimen --laparoscopic left nephroureterectomy tentatively planned 05/30/22 --appreciate Dr. Alyson Ingles consult

## 2022-04-21 NOTE — Assessment & Plan Note (Addendum)
-  PET scan (03/16/2022): Soft tissue mass within the left renal pelvis, SUV 6.7.  No hypermetabolic lymph nodes.  No liver lesions hypermetabolic. -He underwent ureteroscopy and biopsy on 03/27/2022. 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  Muscularis propria not identified in the current specimen --laparoscopic left nephroureterectomy and TURBT tentatively planned 05/30/22 -follow up with Dr. Alyson Ingles around 05/03/22 already scheduled

## 2022-04-21 NOTE — Assessment & Plan Note (Addendum)
Resume home meds ?

## 2022-04-21 NOTE — ED Notes (Signed)
Pharmacy contacted to verify frequency of Rocephin administration

## 2022-04-21 NOTE — ED Triage Notes (Signed)
Pt to the ED with complaints of chills and fever for the past few days.  Pt has left kidney CA., and has an appt. for an iron infusion today in the CA center at 1115.

## 2022-04-21 NOTE — ED Notes (Signed)
MD notified re: temp

## 2022-04-21 NOTE — Progress Notes (Signed)
[facility] is following code sepsis 

## 2022-04-21 NOTE — Assessment & Plan Note (Addendum)
Hold metformin 04/21/22 A1C--5.9 novolog sliding scale CBG (last 3)  Recent Labs    04/27/22 1552 04/27/22 2116 04/28/22 0724  GLUCAP 176* 150* 204*

## 2022-04-21 NOTE — H&P (Signed)
History and Physical    Patient: Joshua Osborne AOZ:[id number] DOB: [birth year] DOA: 04/21/2022 DOS: the patient was seen and examined on 04/21/2022 PCP: Earney Mallet, MD  Patient coming from: Home  Chief Complaint:  Chief Complaint  Patient presents with   Chills   HPI: Joshua Osborne is a 81 year old male with a history of depression, diabetes mellitus, urothelial carcinoma, hyperlipidemia presenting with 2-week history of generalized weakness.  In the past couple days, the patient has had fevers and chills.  He actually had shivering and rigors on the morning of 04/21/2022.  He has had poor oral intake.  His wife took his temperature on the morning of 04/21/2022 and it was 100.0 F.  He went to see his urologist, Dr. Lovena Neighbours, on 04/20/2022.  He was diagnosed with a UTI and started on cephalexin.  He took 2 doses prior to coming to the hospital.  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  He is scheduled tentatively for a laparoscopic left-sided nephroureterectomy on 05/30/2022.  He had 2 episodes of nausea and vomiting prior to coming to the hospital on the morning of admission.  He denies any abdominal pain but complains of left-sided flank pain which has worsened over the last couple days prior to admission.  He has been taking oxycodone 5 mg approximately twice daily since his renal biopsy.  He denies any headache, neck pain, chest pain, shortness of breath, cough, hemoptysis.  There is no diarrhea, hematochezia, melena, abdominal pain.  He has some intermittent dysuria. In the ED, the patient was febrile up to 101.7 F.  He was tachycardic into the 140s.  He was hemodynamically stable.  WBC 34.4, hemoglobin 8.4, platelets 266,000.  Sodium 133, potassium 4.5, bicarbonate 23, serum creatinine 1.76.  AST 32, ALT 47, ALP 157, total bilirubin 1.3.  UA shows greater than 50 WBC, 21-50 RBC.  Lactic acid 1.2.  CT of the abdomen and pelvis  showed increased left hydronephrosis with hydroureter secondary to persistent soft tissue within the left renal collecting system consistent with tumor extending to the UPJ.  There is nonobstructive left renal calculi.  The patient was given the appropriate fluid boluses and started on ceftriaxone.  He was admitted for further evaluation and treatment.  Review of Systems: As mentioned in the history of present illness. All other systems reviewed and are negative. Past Medical History:  Diagnosis Date   Bladder tumor    Cancer ([place])    Diabetes mellitus without complication ([place])    Past Surgical History:  Procedure Laterality Date   LIVER BIOPSY     Social History:  reports that he has quit smoking. His smoking use included cigarettes. He has never used smokeless tobacco. He reports that he does not currently use alcohol. No history on file for drug use.  No Known Allergies  History reviewed. No pertinent family history.  Prior to Admission medications   Medication Sig Start Date End Date Taking? Authorizing Provider  citalopram (CELEXA) 10 MG tablet Take 10 mg by mouth daily.   Yes [provider]  fluorouracil (EFUDEX) 5 % cream Apply 1 Application topically 2 (two) times daily.   Yes [provider]  latanoprost (XALATAN) 0.005 % ophthalmic solution Place 1 drop into both eyes at bedtime. 12/19/21  Yes [provider]  metFORMIN (GLUCOPHAGE) 500 MG tablet Take 500 mg by mouth 2 (two) times daily with a meal.   Yes [provider]  Multiple Vitamin (  MULTI-VITAMIN DAILY PO) Take 1 capsule by mouth daily.   Yes [provider]  niacin 500 MG tablet Take 500 mg by mouth at bedtime.   Yes [provider]  omeprazole (PRILOSEC) 20 MG capsule Take 20 mg by mouth daily.   Yes [provider]  PERCOCET 5-325 MG tablet Take 1 tablet by mouth every 4 (four) hours as needed. 04/20/22  Yes [provider]  saw palmetto 500 MG  capsule Take 500 mg by mouth 2 (two) times daily.   Yes [provider]  simvastatin (ZOCOR) 40 MG tablet Take 40 mg by mouth daily.   Yes [provider]  tamsulosin (FLOMAX) 0.4 MG CAPS capsule Take 0.4 mg by mouth daily.   Yes [provider]  tizanidine (ZANAFLEX) 2 MG capsule Take 2 mg by mouth 3 (three) times daily.   Yes [provider]  HYDROcodone-acetaminophen (NORCO/VICODIN) 5-325 MG tablet Take 1 tablet by mouth 3 (three) times daily as needed. Patient not taking: Reported on 04/21/2022 12/01/21   [provider]    Physical Exam: Vitals:   04/21/22 1300 04/21/22 1310 04/21/22 1330 04/21/22 1403  BP: 138/62  (!) 128/55   Pulse: (!) 118  (!) 114 (!) 119  Resp: (!) '22  20 15  '$ Temp:  (!) 101.7 F (38.7 C)    TempSrc:  Oral    SpO2: 97%  96% 96%  Weight:      Height:       GENERAL:  A&O x 3, NAD, well developed, cooperative, follows commands HEENT: [place]/AT, No thrush, No icterus, No oral ulcers Neck:  No neck mass, No meningismus, soft, supple CV: RRR, no S3, no S4, no rub, no JVD Lungs:  CTA, no wheeze, no rhonchi, good air movement Abd: soft/NT +BS, nondistended+left flank pain Ext: No edema, no lymphangitis, no cyanosis, no rashes Neuro:  CN II-XII intact, strength 4/5 in RUE, RLE, strength 4/5 LUE, LLE; sensation intact bilateral; no dysmetria; babinski equivocal  Data Reviewed:  Labs reviewed in history above  Assessment and Plan: * Sepsis due to undetermined organism [facility]) Presented with fever, tachycardia, leukocytosis Lactate peaked 1.2 UA>50 WBC Follow blood and urine culture CXR--no infiltrates  Mixed hyperlipidemia Holding statin temporarily  Controlled type 2 diabetes mellitus without complication, without long-term current use of insulin (HCC) Hold metformin Check A1C novolog sliding scale  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   No hypermetabolic lymph nodes.  No liver lesions hypermetabolic. -He underwent ureteroscopy and biopsy on 03/27/2022. 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  Muscularis propria not identified in the current specimen --laparoscopic left nephroureterectomy and TURBT tentatively planned 05/30/22  Transitional cell carcinoma of renal pelvis, left (HCC) -PET scan (03/16/2022): Soft tissue mass within the left renal pelvis, SUV 6.7.  No hypermetabolic lymph nodes.  No liver lesions hypermetabolic. -He underwent ureteroscopy and biopsy on 03/27/2022. 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  Muscularis propria not identified in the current specimen --laparoscopic left nephroureterectomy tentatively planned 05/30/22      Advance Care Planning: FULL  Consults: none  Family Communication: spouse updated 7/14  Severity of Illness: The appropriate patient status for this patient is INPATIENT. Inpatient status is judged to be reasonable and necessary in order to provide the required intensity of service to ensure the patient's safety. The patient's presenting symptoms, physical exam findings, and initial radiographic and laboratory data in the context of  their chronic comorbidities is felt to place them at high risk for further clinical deterioration. Furthermore, it is not anticipated that the patient will be medically stable for discharge from the hospital within 2 midnights of admission.   * I certify that at the point of admission it is my clinical judgment that the patient will require inpatient hospital care spanning beyond 2 midnights from the point of admission due to high intensity of service, high risk for further deterioration and high frequency of surveillance required.*  Author: Orson Eva, MD 04/21/2022 2:17 PM  For on call review [email or web address].

## 2022-04-22 DIAGNOSIS — E119 Type 2 diabetes mellitus without complications: Secondary | ICD-10-CM

## 2022-04-22 DIAGNOSIS — A419 Sepsis, unspecified organism: Secondary | ICD-10-CM | POA: Diagnosis not present

## 2022-04-22 DIAGNOSIS — N10 Acute tubulo-interstitial nephritis: Secondary | ICD-10-CM | POA: Diagnosis not present

## 2022-04-22 DIAGNOSIS — C652 Malignant neoplasm of left renal pelvis: Secondary | ICD-10-CM

## 2022-04-22 DIAGNOSIS — E782 Mixed hyperlipidemia: Secondary | ICD-10-CM

## 2022-04-22 DIAGNOSIS — D649 Anemia, unspecified: Secondary | ICD-10-CM

## 2022-04-22 DIAGNOSIS — R6521 Severe sepsis with septic shock: Secondary | ICD-10-CM

## 2022-04-22 DIAGNOSIS — G893 Neoplasm related pain (acute) (chronic): Secondary | ICD-10-CM | POA: Diagnosis present

## 2022-04-22 LAB — COMPREHENSIVE METABOLIC PANEL
ALT: 39 U/L (ref 0–44)
AST: 30 U/L (ref 15–41)
Albumin: 3 g/dL — ABNORMAL LOW (ref 3.5–5.0)
Alkaline Phosphatase: 134 U/L — ABNORMAL HIGH (ref 38–126)
Anion gap: 10 (ref 5–15)
BUN: 24 mg/dL — ABNORMAL HIGH (ref 8–23)
CO2: 21 mmol/L — ABNORMAL LOW (ref 22–32)
Calcium: 8 mg/dL — ABNORMAL LOW (ref 8.9–10.3)
Chloride: 105 mmol/L (ref 98–111)
Creatinine, Ser: 1.65 mg/dL — ABNORMAL HIGH (ref 0.61–1.24)
GFR, Estimated: 42 mL/min — ABNORMAL LOW (ref >60)
Glucose, Bld: 163 mg/dL — ABNORMAL HIGH (ref 70–99)
Potassium: 4.3 mmol/L (ref 3.5–5.1)
Sodium: 136 mmol/L (ref 135–145)
Total Bilirubin: 1.1 mg/dL (ref 0.3–1.2)
Total Protein: 6.4 g/dL — ABNORMAL LOW (ref 6.5–8.1)

## 2022-04-22 LAB — GLUCOSE, CAPILLARY
Glucose-Capillary: 168 mg/dL — ABNORMAL HIGH (ref 70–99)
Glucose-Capillary: 195 mg/dL — ABNORMAL HIGH (ref 70–99)
Glucose-Capillary: 121 mg/dL — ABNORMAL HIGH (ref 70–99)
Glucose-Capillary: 175 mg/dL — ABNORMAL HIGH (ref 70–99)

## 2022-04-22 LAB — HEMOGLOBIN AND HEMATOCRIT, BLOOD
HCT: 21.9 % — ABNORMAL LOW (ref 39.0–52.0)
Hemoglobin: 7 g/dL — ABNORMAL LOW (ref 13.0–17.0)

## 2022-04-22 LAB — CBC
HCT: 21.7 % — ABNORMAL LOW (ref 39.0–52.0)
Hemoglobin: 6.9 g/dL — CL (ref 13.0–17.0)
MCH: 20.5 pg — ABNORMAL LOW (ref 26.0–34.0)
MCHC: 31.8 g/dL (ref 30.0–36.0)
MCV: 64.4 fL — ABNORMAL LOW (ref 80.0–100.0)
Platelets: 329 10*3/uL (ref 150–400)
RBC: 3.37 MIL/uL — ABNORMAL LOW (ref 4.22–5.81)
RDW: 15.1 % (ref 11.5–15.5)
WBC: 41.6 10*3/uL — ABNORMAL HIGH (ref 4.0–10.5)
nRBC: 0 % (ref 0.0–0.2)

## 2022-04-22 LAB — ABO/RH: ABO/RH(D): A POS

## 2022-04-22 LAB — PREPARE RBC (CROSSMATCH)

## 2022-04-22 MED ORDER — SODIUM CHLORIDE 0.9 % IV BOLUS
500.0000 mL | Freq: Once | INTRAVENOUS | Status: AC
  Administered 2022-04-22: 500 mL via INTRAVENOUS

## 2022-04-22 MED ORDER — ADULT MULTIVITAMIN W/MINERALS CH
1.0000 | ORAL_TABLET | Freq: Every day | ORAL | Status: DC
  Administered 2022-04-22 – 2022-04-28 (×7): 1 via ORAL
  Filled 2022-04-22 (×7): qty 1

## 2022-04-22 MED ORDER — SODIUM CHLORIDE 0.9 % IV SOLN: 250.0000 mL | INTRAVENOUS | Status: DC

## 2022-04-22 MED ORDER — HYDROMORPHONE HCL 1 MG/ML IJ SOLN
0.5000 mg | INTRAMUSCULAR | Status: DC | PRN
  Administered 2022-04-22 – 2022-04-23 (×5): 0.5 mg via INTRAVENOUS
  Filled 2022-04-22 (×5): qty 0.5

## 2022-04-22 MED ORDER — PIPERACILLIN-TAZOBACTAM 3.375 G IVPB
3.3750 g | Freq: Three times a day (TID) | INTRAVENOUS | Status: DC
  Administered 2022-04-22 – 2022-04-28 (×18): 3.375 g via INTRAVENOUS
  Filled 2022-04-22 (×18): qty 50

## 2022-04-22 MED ORDER — ENSURE ENLIVE PO LIQD
237.0000 mL | Freq: Three times a day (TID) | ORAL | Status: DC
  Administered 2022-04-22 – 2022-04-28 (×12): 237 mL via ORAL

## 2022-04-22 MED ORDER — NOREPINEPHRINE 4 MG/250ML-% IV SOLN
2.0000 ug/min | INTRAVENOUS | Status: DC
  Administered 2022-04-22: 2 ug/min via INTRAVENOUS
  Filled 2022-04-22: qty 250

## 2022-04-22 MED ORDER — SODIUM CHLORIDE 0.9% IV SOLUTION: Freq: Once | INTRAVENOUS | Status: AC

## 2022-04-22 MED ORDER — LACTATED RINGERS IV BOLUS
1000.0000 mL | Freq: Once | INTRAVENOUS | Status: AC
  Administered 2022-04-22: 1000 mL via INTRAVENOUS

## 2022-04-22 NOTE — Progress Notes (Addendum)
Date and time results received: 04/22/22 1029 (use smartphrase ".now" to insert current time)  Test: HGB  Critical Value: 6.9  Name of Provider Notified: Dr Tat  Orders Received? Or Actions Taken?:  One unit of PRBC ordered

## 2022-04-22 NOTE — Progress Notes (Addendum)
Responded to nursing call:  fever up to 101.5 about 15 min after PRBC transfusion Pt given acetaminophen 1543.  Fever documented at 1612 Came to evaluate patient   Subjective: Patient denies rigors, headache, chest pain, dyspnea, cough, nausea, vomiting, diarrhea, abdominal pain, rash   Vitals:   04/22/22 1612 04/22/22 1615 04/22/22 1630 04/22/22 1648  BP: (!) 115/46 (!) 110/45 (!) 121/41   Pulse: 96 96 97   Resp: (!) 23  19   Temp: (!) 101.5 F (38.6 C)   (!) 100.5 F (38.1 C)  TempSrc: Oral   Axillary  SpO2: 97% 94% 95%   Weight:      Height:       CV--RRR Lung--CTA Abd--soft+BS/NT   Assessment/Plan: Fever --HR92--RR16--105/48--97% RA --unclear if truly due to PRBC vs sepsis --rate of PRBC transfusion cut in half --rechecked temp (oral) at 1705--100.9 --pt states he's feeling better than an hour ago--denies f/c, cp, sob cough, rash --continue transfusion at reduced rate     Orson Eva, DO [facility]

## 2022-04-22 NOTE — Assessment & Plan Note (Addendum)
WBC up 41.6,>>32.2>22.4>>21.0 --fever curve is improving>>spiked fever 7/17 around 1745 --pt clinically stable --broaden abx pending culture data>>zosyn started 7/15 --urine culture is neg but pt was on cephalexin prior to admission -04/24/22--repeat blood and urine culture--respiked fever 103.1 -urology consulted>>discussed with Dr. Alyson Ingles -left ureteral stent placed 04/26/2022  -dc home on oral augmentin to complete full course of antibiotics -probiotics also added  -per Dr. Alyson Ingles if afebrile overnight, can DC foley and home tomorrow morning

## 2022-04-22 NOTE — Progress Notes (Signed)
Unit of PRBC was started at around 1557. Oral temp was 99.8 (notified Dr Tat and Probation officer gave prn Tylenol prior to blood being hung) and when oral temp was rechecked (15 minute post blood start) at 1612, it was 101.5. Patient other vital signs stable. Patient had no complaints of shortness of breath, chest pain or any discomfort, nausea or vomiting or pain. Patient stated, "I feel fine." Patient alert and oriented x4 and sitting up in bed talking with wife. No rash noted anywhere. Dr Tat made aware of temp change (even though it was just under the 2 degree farenheit). Blood infusion decreased by half from 169m to 630mhr per verbal from Dr Tat. Temp rechecked around 30 minutes later and resulted the same, 100.5 axillary due to patient had recently ate ice cream. Dr Tat made aware. Dr Tat then in to assess patient and per verbal from Dr Tat, continue blood transfusion and may increase to 10015mr. Will continue to monitor. Patient is currently sitting up in bed with no complaints.

## 2022-04-22 NOTE — Assessment & Plan Note (Addendum)
Transfused one unit PRBC 7/15 -transfused one unit PRBC 7/17 -hg up to 8.6 and stable

## 2022-04-22 NOTE — Progress Notes (Signed)
Initial Nutrition Assessment  DOCUMENTATION CODES:   Not applicable  INTERVENTION:   Ensure Enlive po BID, each supplement provides 350 kcal and 20 grams of protein.  MVI with minerals daily.  NUTRITION DIAGNOSIS:   Inadequate oral intake related to decreased appetite as evidenced by per patient/family report.  GOAL:   Patient will meet greater than or equal to 90% of their needs  MONITOR:   PO intake, Supplement acceptance  REASON FOR ASSESSMENT:   Malnutrition Screening Tool    ASSESSMENT:   81 yo male admitted with generalized weakness x 2 weeks, UTI. PMH includes renal cell carcinoma (potential nephrectomy in the next few months), bladder tumor, DM.  RD working remotely. Unable to reach patient by phone. Per chart review, patient has recently had a poor appetite and has been eating poorly. On admission nutrition screen he reported poor intake d/t poor appetite and recent 2-12 lb weight loss. Currently on a carbohydrate modified diet. Meal intakes not recorded. Weight history reviewed. Patient with 5.5% weight loss over the past 4 months. Ensure is being offered BID.   Labs reviewed. A1C 5.9 CBG: 168-195  Medications reviewed and include Novolog, Flomaz.  NUTRITION - FOCUSED PHYSICAL EXAM:  Unable to complete, RD working remotely  Diet Order:   Diet Order             Diet Carb Modified Fluid consistency: Thin; Room service appropriate? Yes  Diet effective now                   EDUCATION NEEDS:   No education needs have been identified at this time  Skin:  Skin Assessment: Reviewed RN Assessment  Last BM:  7/14  Height:   Ht Readings from Last 1 Encounters:  04/21/22 6' (1.829 m)    Weight:   Wt Readings from Last 1 Encounters:  04/21/22 82.3 kg     BMI:  Body mass index is 24.61 kg/m.  Estimated Nutritional Needs:   Kcal:  2100-2300  Protein:  110-125 gm  Fluid:  2.1-2.3 L   Lucas Mallow RD, LDN, CNSC Please refer to Amion  for contact information.

## 2022-04-22 NOTE — Progress Notes (Signed)
PROGRESS NOTE  Joshua Osborne VXB:[id number] DOB: 11-23-1940 DOA: 04/21/2022 PCP: Earney Mallet, MD  Brief History:  81 year old male with a history of depression, diabetes mellitus, urothelial carcinoma, hyperlipidemia presenting with 2-week history of generalized weakness.  In the past couple days, the patient has had fevers and chills.  He actually had shivering and rigors on the morning of 04/21/2022.  He has had poor oral intake.  His wife took his temperature on the morning of 04/21/2022 and it was 100.0 F.  He went to see his urologist, Dr. Lovena Neighbours, on 04/20/2022.  He was diagnosed with a UTI and started on cephalexin.  He took 2 doses prior to coming to the hospital.  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  He is scheduled tentatively for a laparoscopic left-sided nephroureterectomy on 05/30/2022.  He had 2 episodes of nausea and vomiting prior to coming to the hospital on the morning of admission.  He denies any abdominal pain but complains of left-sided flank pain which has worsened over the last couple days prior to admission.  He has been taking oxycodone 5 mg approximately twice daily since his renal biopsy.  He denies any headache, neck pain, chest pain, shortness of breath, cough, hemoptysis.  There is no diarrhea, hematochezia, melena, abdominal pain.  He has some intermittent dysuria. In the ED, the patient was febrile up to 101.7 F.  He was tachycardic into the 140s.  He was hemodynamically stable.  WBC 34.4, hemoglobin 8.4, platelets 266,000.  Sodium 133, potassium 4.5, bicarbonate 23, serum creatinine 1.76.  AST 32, ALT 47, ALP 157, total bilirubin 1.3.  UA shows greater than 50 WBC, 21-50 RBC.  Lactic acid 1.2.  CT of the abdomen and pelvis showed increased left hydronephrosis with hydroureter secondary to persistent soft tissue within the left renal collecting system consistent with tumor extending to the UPJ.   There is nonobstructive left renal calculi.  The patient was given the appropriate fluid boluses and started on ceftriaxone.  He was admitted for further evaluation and treatment.   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[place])   Acute pyelonephritis   Controlled type 2 diabetes mellitus without complication, without long-term current use of insulin (HCC)   Mixed hyperlipidemia   Cancer related pain  Assessment and Plan: * Septic shock ([place]) Presented with fever, tachycardia, leukocytosis Required low dose levophed overnight 7/14-7/15 Lactate peaked 1.2 UA>50 WBC Follow blood and urine cultures CXR--no infiltrates WBC up to 41.6>>order differential Continue IVF rebolus LR--1L  Cancer related pain Left flank pain not well controlled D/c oxycodone Start IV dilaudid  Mixed hyperlipidemia Holding statin temporarily  Controlled type 2 diabetes mellitus without complication, without long-term current use of insulin (HCC) Hold metformin 04/21/22 A1C--5.9 novolog sliding scale  Acute pyelonephritis WBC up 41.6, but suspect this is more leukemoid/stress reaction rather than overwhelming or uncontrolled infection --fever curve is improved --pt clinically stable --broaden abx pending culture data>>zosyn  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  No hypermetabolic lymph nodes.  No liver lesions hypermetabolic. -He underwent ureteroscopy and biopsy on 03/27/2022. 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  Muscularis propria not identified in the current specimen --laparoscopic left nephroureterectomy and  TURBT tentatively planned 05/30/22  Transitional cell carcinoma of renal pelvis, left (HCC) -PET scan (03/16/2022): Soft tissue mass  within the left renal pelvis, SUV 6.7.  No hypermetabolic lymph nodes.  No liver lesions hypermetabolic. -He underwent ureteroscopy and biopsy on 03/27/2022. 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  Muscularis propria not identified in the current specimen --laparoscopic left nephroureterectomy tentatively planned 05/30/22  Symptomatic anemia Hgb dropped to 6.9 Likely dilution Transfuse one unit PRBC       Family Communication:  spouse updated at bedside  Consultants:  none  Code Status:  FULL   DVT Prophylaxis:  [place] Lovenox   Procedures: As Listed in Progress Note Above  Antibiotics: Ceftriaxone 7/14>>7/15 Zosyn 7/15>>      Subjective: Patient complains of left flank pain.  Denies cp, sob, cough, n/v/d, abd pain.  No hematochezia, no melena.  Complains of dysuria  Objective: Vitals:   04/22/22 1345 04/22/22 1400 04/22/22 1415 04/22/22 1445  BP: 104/63 (!) 119/51 (!) 121/49 (!) 113/43  Pulse: 100 (!) 103 88 95  Resp: (!) '22 20 17 16  '$ Temp:      TempSrc:      SpO2: 97% 97% 97% 98%  Weight:      Height:        Intake/Output Summary (Last 24 hours) at 04/22/2022 1510 Last data filed at 04/22/2022 1408 Gross per 24 hour  Intake 5082 ml  Output 870 ml  Net 4212 ml   Weight change:  Exam:  General:  Pt is alert, follows commands appropriately, not in acute distress HEENT: No icterus, No thrush, No neck mass, [place]/AT Cardiovascular: RRR, S1/S2, no rubs, no gallops Respiratory: fine bibasilar crackles. No wheeze Abdomen: Soft/+BS, L-flank tender, non distended, no guarding--no visible rashes Extremities: No edema, No lymphangitis, No petechiae, No rashes, no synovitis   Data Reviewed: I have personally reviewed following labs and imaging studies Basic Metabolic Panel: Recent Labs  Lab 04/21/22 1114 04/22/22 0350  NA 133* 136  K 4.5 4.3  CL 100 105  CO2 23 21*  GLUCOSE 183* 163*  BUN 32* 24*  CREATININE 1.76* 1.65*  CALCIUM  9.0 8.0*   Liver Function Tests: Recent Labs  Lab 04/21/22 1114 04/22/22 0350  AST 32 30  ALT 47* 39  ALKPHOS 157* 134*  BILITOT 1.3* 1.1  PROT 7.7 6.4*  ALBUMIN 3.7 3.0*   No results for input(s): "LIPASE", "AMYLASE" in the last 168 hours. No results for input(s): "AMMONIA" in the last 168 hours. Coagulation Profile: Recent Labs  Lab 04/21/22 1114  INR 1.2   CBC: Recent Labs  Lab 04/21/22 1114 04/22/22 0954  WBC 34.4* 41.6*  NEUTROABS 31.1*  --   HGB 8.4* 6.9*  HCT 26.2* 21.7*  MCV 64.2* 64.4*  PLT 366 329   Cardiac Enzymes: No results for input(s): "CKTOTAL", "CKMB", "CKMBINDEX", "TROPONINI" in the last 168 hours. BNP: Invalid input(s): "POCBNP" CBG: Recent Labs  Lab 04/21/22 1702 04/21/22 2135 04/22/22 0741 04/22/22 1130  GLUCAP 184* 157* 168* 195*   HbA1C: Recent Labs    04/21/22 1418  HGBA1C 5.9*   Urine analysis:    Component Value Date/Time   COLORURINE YELLOW 04/21/2022 1055   APPEARANCEUR CLOUDY (A) 04/21/2022 1055   LABSPEC 1.018 04/21/2022 1055   PHURINE 5.0 04/21/2022 1055   GLUCOSEU NEGATIVE 04/21/2022 1055   HGBUR MODERATE (A) 04/21/2022 1055   BILIRUBINUR NEGATIVE 04/21/2022 1055   KETONESUR 20 (A) 04/21/2022 1055   PROTEINUR 100 (A) 04/21/2022 1055  NITRITE NEGATIVE 04/21/2022 1055   LEUKOCYTESUR MODERATE (A) 04/21/2022 1055   Sepsis Labs: '@LABRCNTIP'$ (procalcitonin:4,lacticidven:4) ) Recent Results (from the past 240 hour(s))  Culture, blood (Routine x 2)     Status: None (Preliminary result)   Collection Time: 04/21/22 11:17 AM   Specimen: BLOOD RIGHT ARM  Result Value Ref Range Status   Specimen Description   Final    BLOOD RIGHT ARM BOTTLES DRAWN AEROBIC AND ANAEROBIC   Special Requests   Final    Blood Culture results may not be optimal due to an excessive volume of blood received in culture bottles   Culture   Final    NO GROWTH < 12 HOURS Performed at [facility], [address] [place], [place] [address]     Report Status PENDING  Incomplete  Culture, blood (Routine x 2)     Status: None (Preliminary result)   Collection Time: 04/21/22 11:17 AM   Specimen: BLOOD LEFT ARM  Result Value Ref Range Status   Specimen Description BLOOD LEFT ARM BOTTLES DRAWN AEROBIC AND ANAEROBIC  Final   Special Requests Blood Culture adequate volume  Final   Culture   Final    NO GROWTH < 12 HOURS Performed at [facility], [address], [place], [place] [address]    Report Status PENDING  Incomplete  MRSA Next Gen by PCR, Nasal     Status: None   Collection Time: 04/21/22  3:39 PM   Specimen: Nasal Mucosa; Nasal Swab  Result Value Ref Range Status   MRSA by PCR Next Gen NOT DETECTED NOT DETECTED Final    Comment: (NOTE) The GeneXpert MRSA Assay (FDA approved for NASAL specimens only), is one component of a comprehensive MRSA colonization surveillance program. It is not intended to diagnose MRSA infection nor to guide or monitor treatment for MRSA infections. Test performance is not FDA approved in patients less than 69 years old. Performed at [facility], [address] [place], [place] [address]      Scheduled Meds:  sodium chloride   Intravenous Once   Chlorhexidine Gluconate Cloth  6 each Topical Daily   citalopram  10 mg Oral Daily   enoxaparin (LOVENOX) injection  40 mg Subcutaneous Q24H   feeding supplement  237 mL Oral BID BM   insulin aspart  0-9 Units Subcutaneous TID WC   latanoprost  1 drop Both Eyes QHS   niacin  500 mg Oral QHS   pantoprazole  40 mg Oral Daily   tamsulosin  0.4 mg Oral Daily   tiZANidine  2 mg Oral QHS   Continuous Infusions:  sodium chloride 100 mL/hr at 04/22/22 1408   sodium chloride     norepinephrine (LEVOPHED) Adult infusion Stopped (04/22/22 1250)   piperacillin-tazobactam (ZOSYN)  IV 3.375 g (04/22/22 1413)    Procedures/Studies: CT ABDOMEN PELVIS W CONTRAST  Result Date: 04/21/2022 CLINICAL DATA:  LEFT flank pain and LEFT lower quadrant pain, fever,  and chills for the past few days, suspected kidney stone; history of renal cell carcinoma, diverticulitis, pyelonephritis EXAM: CT ABDOMEN AND PELVIS WITH CONTRAST TECHNIQUE: Multidetector CT imaging of the abdomen and pelvis was performed using the standard protocol following bolus administration of intravenous contrast. RADIATION DOSE REDUCTION: This exam was performed according to the departmental dose-optimization program which includes automated exposure control, adjustment of the mA and/or kV according to patient size and/or use of iterative reconstruction technique. CONTRAST:  52m OMNIPAQUE IOHEXOL 300 MG/ML SOLN IV. No oral contrast. COMPARISON:  03/07/2022 Correlation:  PET-CT 03/16/2022 FINDINGS: Lower chest: Lung bases clear Hepatobiliary: Calcifications with surrounding low attenuation in the RIGHT lobe of the liver question treated metastases. Post cholecystectomy. No new hepatic abnormalities. Pancreas: Atrophic pancreas without mass Spleen: Normal appearance Adrenals/Urinary Tract: Adrenal glands and RIGHT kidney normal appearance. Again identified soft tissue within LEFT renal collecting system consistent with known tumor, extending to the reader 0 pelvic junction and causing increased LEFT hydronephrosis. Nonobstructing LEFT renal calculi. LEFT ureter is dilated in its course to the urinary bladder without calcification or mass. RIGHT ureter decompressed. Bladder unremarkable. Stomach/Bowel: Normal appendix. Diverticulosis of descending and sigmoid colon without evidence of diverticulitis. Stomach and remaining bowel loops unremarkable. Vascular/Lymphatic: Atherosclerotic calcifications aorta and iliac arteries without aneurysm. Vascular structures patent. No adenopathy. Reproductive: Significant prostatic enlargement, gland measuring 7.2 x 6.0 x 5.7 cm (volume = 130 cm^3). Seminal vesicles unremarkable. Other: No free air or free fluid. No hernia or inflammatory process. Musculoskeletal:  Unremarkable IMPRESSION: Increased LEFT hydronephrosis and hydroureter secondary to persistent soft tissue within the LEFT renal collecting system consistent with known tumor, extending to the ureteropelvic junction Nonobstructing LEFT renal calculi. Significant prostatic enlargement. Distal colonic diverticulosis without evidence of diverticulitis. 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 Aortic Atherosclerosis (ICD10-I70.0). Electronically Signed   By: Lavonia Dana M.D.   On: 04/21/2022 12:49   DG Chest 2 View  Result Date: 04/21/2022 CLINICAL DATA:  Suspected sepsis EXAM: CHEST - 2 VIEW COMPARISON:  None Available. FINDINGS: The heart size and mediastinal contours are within normal limits. Both lungs are clear. The visualized skeletal structures are unremarkable. IMPRESSION: No active cardiopulmonary disease. Electronically Signed   By: Dorise Bullion III M.D.   On: 04/21/2022 11:30    Orson Eva, DO  [facility]  If 7PM-7AM, please contact night-coverage [email or web address] Password [email or web address] 04/22/2022, 3:10 PM   LOS: 1 day

## 2022-04-22 NOTE — Assessment & Plan Note (Addendum)
Left flank pain not well controlled D/c oxycodone continue dilaudid prn Changed back to po dilaudid after discussion with Dr. Alyson Ingles 7/17 -resume home pain meds at discharge -confirmed patient just picked up a 30 day rx on 04/21/22

## 2022-04-23 DIAGNOSIS — E119 Type 2 diabetes mellitus without complications: Secondary | ICD-10-CM | POA: Diagnosis not present

## 2022-04-23 DIAGNOSIS — N10 Acute tubulo-interstitial nephritis: Secondary | ICD-10-CM | POA: Diagnosis not present

## 2022-04-23 DIAGNOSIS — A419 Sepsis, unspecified organism: Secondary | ICD-10-CM | POA: Diagnosis not present

## 2022-04-23 DIAGNOSIS — N179 Acute kidney failure, unspecified: Secondary | ICD-10-CM | POA: Diagnosis not present

## 2022-04-23 LAB — GLUCOSE, CAPILLARY
Glucose-Capillary: 142 mg/dL — ABNORMAL HIGH (ref 70–99)
Glucose-Capillary: 162 mg/dL — ABNORMAL HIGH (ref 70–99)
Glucose-Capillary: 166 mg/dL — ABNORMAL HIGH (ref 70–99)
Glucose-Capillary: 158 mg/dL — ABNORMAL HIGH (ref 70–99)

## 2022-04-23 LAB — COMPREHENSIVE METABOLIC PANEL
ALT: 42 U/L (ref 0–44)
AST: 33 U/L (ref 15–41)
Albumin: 2.9 g/dL — ABNORMAL LOW (ref 3.5–5.0)
Alkaline Phosphatase: 150 U/L — ABNORMAL HIGH (ref 38–126)
Anion gap: 10 (ref 5–15)
BUN: 21 mg/dL (ref 8–23)
CO2: 23 mmol/L (ref 22–32)
Calcium: 8.3 mg/dL — ABNORMAL LOW (ref 8.9–10.3)
Chloride: 102 mmol/L (ref 98–111)
Creatinine, Ser: 1.6 mg/dL — ABNORMAL HIGH (ref 0.61–1.24)
GFR, Estimated: 44 mL/min — ABNORMAL LOW (ref >60)
Glucose, Bld: 167 mg/dL — ABNORMAL HIGH (ref 70–99)
Potassium: 3.7 mmol/L (ref 3.5–5.1)
Sodium: 135 mmol/L (ref 135–145)
Total Bilirubin: 1.2 mg/dL (ref 0.3–1.2)
Total Protein: 6.4 g/dL — ABNORMAL LOW (ref 6.5–8.1)

## 2022-04-23 LAB — CBC WITH DIFFERENTIAL/PLATELET
Abs Immature Granulocytes: 0.74 10*3/uL — ABNORMAL HIGH (ref 0.00–0.07)
Basophils Absolute: 0.1 10*3/uL (ref 0.0–0.1)
Basophils Relative: 0 %
Eosinophils Absolute: 0.1 10*3/uL (ref 0.0–0.5)
Eosinophils Relative: 0 %
HCT: 23.8 % — ABNORMAL LOW (ref 39.0–52.0)
Hemoglobin: 7.6 g/dL — ABNORMAL LOW (ref 13.0–17.0)
Immature Granulocytes: 2 %
Lymphocytes Relative: 3 %
Lymphs Abs: 0.8 10*3/uL (ref 0.7–4.0)
MCH: 21.2 pg — ABNORMAL LOW (ref 26.0–34.0)
MCHC: 31.9 g/dL (ref 30.0–36.0)
MCV: 66.5 fL — ABNORMAL LOW (ref 80.0–100.0)
Monocytes Absolute: 1.6 10*3/uL — ABNORMAL HIGH (ref 0.1–1.0)
Monocytes Relative: 5 %
Neutro Abs: 28.8 10*3/uL — ABNORMAL HIGH (ref 1.7–7.7)
Neutrophils Relative %: 90 %
Platelets: 316 10*3/uL (ref 150–400)
RBC: 3.58 MIL/uL — ABNORMAL LOW (ref 4.22–5.81)
RDW: 17.2 % — ABNORMAL HIGH (ref 11.5–15.5)
WBC: 32.2 10*3/uL — ABNORMAL HIGH (ref 4.0–10.5)
nRBC: 0 % (ref 0.0–0.2)

## 2022-04-23 LAB — URINE CULTURE: Culture: NO GROWTH

## 2022-04-23 LAB — MAGNESIUM: Magnesium: 1.4 mg/dL — ABNORMAL LOW (ref 1.7–2.4)

## 2022-04-23 MED ORDER — MUSCLE RUB 10-15 % EX CREA
TOPICAL_CREAM | CUTANEOUS | Status: DC | PRN
Start: 2022-04-23 — End: 2022-04-28

## 2022-04-23 MED ORDER — MELATONIN 3 MG PO TABS
6.0000 mg | ORAL_TABLET | Freq: Once | ORAL | Status: AC
  Administered 2022-04-23: 6 mg via ORAL
  Filled 2022-04-23: qty 2

## 2022-04-23 MED ORDER — HYDROMORPHONE HCL 1 MG/ML IJ SOLN
1.0000 mg | INTRAMUSCULAR | Status: DC | PRN
  Administered 2022-04-23 – 2022-04-26 (×7): 1 mg via INTRAVENOUS
  Filled 2022-04-23 (×7): qty 1

## 2022-04-23 MED ORDER — MAGNESIUM SULFATE 2 GM/50ML IV SOLN
2.0000 g | Freq: Once | INTRAVENOUS | Status: AC
Start: 2022-04-23 — End: 2022-04-23
  Administered 2022-04-23: 2 g via INTRAVENOUS
  Filled 2022-04-23: qty 50

## 2022-04-23 NOTE — Assessment & Plan Note (Signed)
repleted ?

## 2022-04-23 NOTE — Progress Notes (Addendum)
PROGRESS NOTE  Joshua Osborne VQM:[id number] DOB: [birth year] DOA: 04/21/2022 PCP: Earney Mallet, MD  Brief History:  81 year old male with a history of depression, diabetes mellitus, urothelial carcinoma, hyperlipidemia presenting with 2-week history of generalized weakness.  In the past couple days, the patient has had fevers and chills.  He actually had shivering and rigors on the morning of 04/21/2022.  He has had poor oral intake.  His wife took his temperature on the morning of 04/21/2022 and it was 100.0 F.  He went to see his urologist, Dr. Lovena Neighbours, on 04/20/2022.  He was diagnosed with a UTI and started on cephalexin.  He took 2 doses prior to coming to the hospital.  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  He is scheduled tentatively for a laparoscopic left-sided nephroureterectomy on 05/30/2022.  He had 2 episodes of nausea and vomiting prior to coming to the hospital on the morning of admission.  He denies any abdominal pain but complains of left-sided flank pain which has worsened over the last couple days prior to admission.  He has been taking oxycodone 5 mg approximately twice daily since his renal biopsy.  He denies any headache, neck pain, chest pain, shortness of breath, cough, hemoptysis.  There is no diarrhea, hematochezia, melena, abdominal pain.  He has some intermittent dysuria. In the ED, the patient was febrile up to 101.7 F.  He was tachycardic into the 140s.  He was hemodynamically stable.  WBC 34.4, hemoglobin 8.4, platelets 266,000.  Sodium 133, potassium 4.5, bicarbonate 23, serum creatinine 1.76.  AST 32, ALT 47, ALP 157, total bilirubin 1.3.  UA shows greater than 50 WBC, 21-50 RBC.  Lactic acid 1.2.  CT of the abdomen and pelvis showed increased left hydronephrosis with hydroureter secondary to persistent soft tissue within the left renal collecting system consistent with tumor extending to the UPJ.   There is nonobstructive left renal calculi.  The patient was given the appropriate fluid boluses and started on ceftriaxone.  He was admitted for further evaluation and treatment.   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[place])   Controlled type 2 diabetes mellitus without complication, without long-term current use of insulin (HCC)   Mixed hyperlipidemia   Cancer related pain   Hypomagnesemia  Assessment and Plan: * Septic shock ([place]) Presented with fever, tachycardia, leukocytosis Required low dose levophed overnight 7/14-7/15>>weaned off Lactate peaked 1.2 UA>50 WBC Follow blood and urine cultures--negative to date but pt was on cephalexin prior to admission CXR--no infiltrates Antibiotics broaden to zosyn WBC up to 41.6>>32.2 -still having fevers, but overall trending down Continue IVF  Acute pyelonephritis WBC up 41.6,>>32.2 --fever curve is improving --pt clinically stable --broaden abx pending culture data>>zosyn started 7/15 --urine culture is neg but pt was on cephalexin prior to admission  AKI (acute kidney injury) ([place]) Multifactorial including sepsis/hypotension, obstruction, hypovolemia -baseline creatiint 0.9-1.0 -serum creatinine peaked 1.76>>1.60 -overall slowly improving -continue IVF  Transitional cell carcinoma of renal pelvis, left ([place]) -PET scan (03/16/2022): Soft tissue mass within the left renal pelvis, SUV 6.7.  No hypermetabolic lymph nodes.  No liver lesions hypermetabolic. -He underwent ureteroscopy and biopsy on 03/27/2022. 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  No hypermetabolic lymph nodes.  No liver lesions hypermetabolic. -He underwent ureteroscopy and biopsy on 03/27/2022. 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  Muscularis propria not identified in the current specimen --laparoscopic left nephroureterectomy and TURBT tentatively planned 05/30/22  Symptomatic anemia Hgb dropped to 6.9 Likely dilution Transfused one unit PRBC 7/15   Family Communication:  spouse updated at bedside   Consultants:  none   Code Status:  FULL    DVT Prophylaxis:  [place] Lovenox     Procedures: As Listed in Progress Note Above   Antibiotics: Ceftriaxone 7/14>>7/15 Zosyn 7/15>>              Subjective: Patient is feeling a little better, but still has pain in left flank.  Denies f/c, cp, sob, n/v/d, abd pain  Objective: Vitals:   04/23/22 0739 04/23/22 1000 04/23/22 1100 04/23/22 1135  BP:  (!) 141/57 110/84   Pulse:      Resp:  19  14  Temp: (!) 100.6 F (38.1 C)   98.7 F (37.1 C)  TempSrc: Oral   Oral  SpO2:      Weight:      Height:        Intake/Output Summary (Last 24 hours) at 04/23/2022 1230 Last data filed at 04/23/2022 1204 Gross per 24 hour  Intake 2826.33 ml  Output 1150 ml  Net 1676.33 ml   Weight change: -1.253 kg Exam:  General:  Pt is alert, follows commands appropriately, not in acute distress HEENT: No icterus, No thrush, No neck mass, [place]/AT Cardiovascular: RRR, S1/S2, no rubs, no gallops Respiratory:  CTA bilaterally, no wheezing, no crackles, no rhonchi Abdomen: Soft/+BS, non tender, non distended, no guarding;  Left CVA tender--no rash Extremities: No edema, No lymphangitis, No petechiae, No rashes, no synovitis   Data Reviewed: I have personally reviewed following labs and imaging studies Basic Metabolic Panel: Recent Labs  Lab 04/21/22 1114 04/22/22 0350 04/23/22 0413  NA 133* 136 135  K 4.5 4.3 3.7  CL 100 105 102  CO2 23 21* 23  GLUCOSE 183* 163* 167*  BUN 32* 24* 21  CREATININE 1.76* 1.65* 1.60*  CALCIUM 9.0 8.0* 8.3*  MG  --   --  1.4*   Liver Function Tests: Recent Labs  Lab 04/21/22 1114 04/22/22 0350 04/23/22 0413  AST 32 30 33  ALT 47* 39 42  ALKPHOS 157* 134* 150*  BILITOT 1.3* 1.1 1.2  PROT 7.7 6.4* 6.4*  ALBUMIN 3.7 3.0* 2.9*   No results for input(s): "LIPASE", "AMYLASE" in the last 168 hours. No results for input(s): "AMMONIA" in the last 168 hours. Coagulation Profile: Recent Labs  Lab 04/21/22 1114  INR 1.2   CBC: Recent Labs  Lab 04/21/22 1114 04/22/22 0954 04/22/22 2221 04/23/22 0413  WBC 34.4* 41.6*  --  32.2*  NEUTROABS 31.1*  --   --  28.8*  HGB 8.4* 6.9* 7.0* 7.6*  HCT 26.2* 21.7* 21.9* 23.8*  MCV 64.2* 64.4*  --  66.5*  PLT 366 329  --  316   Cardiac Enzymes: No results for input(s): "CKTOTAL", "CKMB", "CKMBINDEX", "TROPONINI" in the last 168 hours. BNP: Invalid input(s): "POCBNP" CBG: Recent Labs  Lab 04/22/22 1130 04/22/22 1619 04/22/22 2221 04/23/22 0735 04/23/22 1134  GLUCAP 195* 121* 175* 142* 162*   HbA1C: Recent Labs    04/21/22 1418  HGBA1C 5.9*   Urine analysis:    Component Value Date/Time   COLORURINE YELLOW 04/21/2022 1055   APPEARANCEUR CLOUDY (A) 04/21/2022 1055   LABSPEC 1.018 04/21/2022 1055   PHURINE 5.0 04/21/2022 1055   GLUCOSEU NEGATIVE 04/21/2022 1055   HGBUR MODERATE (A) 04/21/2022 1055   BILIRUBINUR NEGATIVE 04/21/2022 1055   KETONESUR 20 (A) 04/21/2022 1055   PROTEINUR 100 (A)  04/21/2022 1055   NITRITE NEGATIVE 04/21/2022 1055   LEUKOCYTESUR MODERATE (A) 04/21/2022 1055   Sepsis Labs: '@LABRCNTIP'$ (procalcitonin:4,lacticidven:4) ) Recent Results (from the past 240 hour(s))  Culture, blood (Routine x 2)     Status: None (Preliminary result)   Collection Time: 04/21/22 11:17 AM   Specimen: BLOOD RIGHT ARM  Result Value Ref Range Status   Specimen Description   Final    BLOOD RIGHT ARM BOTTLES DRAWN AEROBIC AND ANAEROBIC   Special Requests   Final    Blood Culture results may not be optimal due to an excessive volume of blood received in culture bottles   Culture   Final    NO GROWTH 2 DAYS Performed at [facility], [address] [place], [place] [address]    Report Status PENDING  Incomplete  Culture, blood (Routine x 2)     Status: None (Preliminary result)   Collection Time: 04/21/22 11:17 AM   Specimen: BLOOD LEFT ARM  Result Value Ref Range Status   Specimen Description BLOOD LEFT ARM BOTTLES DRAWN AEROBIC AND ANAEROBIC  Final   Special Requests Blood Culture adequate volume  Final   Culture   Final    NO GROWTH 2 DAYS Performed at [facility], [address], [place], [place] [address]    Report Status PENDING  Incomplete  Urine Culture     Status: None   Collection Time: 04/21/22 11:49 AM   Specimen: Urine, Clean Catch  Result Value Ref Range Status   Specimen Description   Final    URINE, CLEAN CATCH Performed at [facility], [address] [place], [place] [address]    Special Requests   Final    NONE Performed at [facility], [address] [place], [place] [address]    Culture   Final    NO GROWTH Performed at [facility], [place] [address], [place], [place] [address]    Report Status 04/23/2022 FINAL  Final  MRSA Next Gen by PCR, Nasal     Status: None   Collection Time: 04/21/22  3:39 PM   Specimen: Nasal Mucosa; Nasal Swab  Result Value Ref Range Status   MRSA by PCR Next Gen NOT DETECTED NOT DETECTED Final    Comment:  (NOTE) The GeneXpert MRSA Assay (FDA approved for NASAL specimens only), is one component of a comprehensive MRSA colonization surveillance program. It is not intended to diagnose MRSA infection nor to guide or monitor treatment for MRSA infections. Test performance is not FDA approved in patients less than 61 years old. Performed at [facility], [address] [place],  [address]      Scheduled Meds:  Chlorhexidine Gluconate Cloth  6 each Topical Daily  citalopram  10 mg Oral Daily   enoxaparin (LOVENOX) injection  40 mg Subcutaneous Q24H   feeding supplement  237 mL Oral TID BM   insulin aspart  0-9 Units Subcutaneous TID WC   latanoprost  1 drop Both Eyes QHS   multivitamin with minerals  1 tablet Oral Daily   niacin  500 mg Oral QHS   pantoprazole  40 mg Oral Daily   tamsulosin  0.4 mg Oral Daily   tiZANidine  2 mg Oral QHS   Continuous Infusions:  sodium chloride 100 mL/hr at 04/23/22 1204   sodium chloride     magnesium sulfate bolus IVPB 2 g (04/23/22 1214)   piperacillin-tazobactam (ZOSYN)  IV Stopped (04/23/22 0836)    Procedures/Studies: CT ABDOMEN PELVIS W CONTRAST  Result Date: 04/21/2022 CLINICAL DATA:  LEFT flank pain and LEFT lower quadrant pain, fever, and chills for the past few days, suspected kidney stone; history of renal cell carcinoma, diverticulitis, pyelonephritis EXAM: CT ABDOMEN AND PELVIS WITH CONTRAST TECHNIQUE: Multidetector CT imaging of the abdomen and pelvis was performed using the standard protocol following bolus administration of intravenous contrast. RADIATION DOSE REDUCTION: This exam was performed according to the departmental dose-optimization program which includes automated exposure control, adjustment of the mA and/or kV according to patient size and/or use of iterative reconstruction technique. CONTRAST:  33m OMNIPAQUE IOHEXOL 300 MG/ML SOLN IV. No oral contrast. COMPARISON:  03/07/2022 Correlation: PET-CT 03/16/2022 FINDINGS:  Lower chest: Lung bases clear Hepatobiliary: Calcifications with surrounding low attenuation in the RIGHT lobe of the liver question treated metastases. Post cholecystectomy. No new hepatic abnormalities. Pancreas: Atrophic pancreas without mass Spleen: Normal appearance Adrenals/Urinary Tract: Adrenal glands and RIGHT kidney normal appearance. Again identified soft tissue within LEFT renal collecting system consistent with known tumor, extending to the reader 0 pelvic junction and causing increased LEFT hydronephrosis. Nonobstructing LEFT renal calculi. LEFT ureter is dilated in its course to the urinary bladder without calcification or mass. RIGHT ureter decompressed. Bladder unremarkable. Stomach/Bowel: Normal appendix. Diverticulosis of descending and sigmoid colon without evidence of diverticulitis. Stomach and remaining bowel loops unremarkable. Vascular/Lymphatic: Atherosclerotic calcifications aorta and iliac arteries without aneurysm. Vascular structures patent. No adenopathy. Reproductive: Significant prostatic enlargement, gland measuring 7.2 x 6.0 x 5.7 cm (volume = 130 cm^3). Seminal vesicles unremarkable. Other: No free air or free fluid. No hernia or inflammatory process. 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 Significant prostatic enlargement. Distal colonic diverticulosis without evidence of diverticulitis. 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 Aortic Atherosclerosis (ICD10-I70.0). Electronically Signed   By: MLavonia DanaM.D.   On: 04/21/2022 12:49   DG Chest 2 View  Result Date: 04/21/2022 CLINICAL DATA:  Suspected sepsis EXAM: CHEST - 2 VIEW COMPARISON:  None Available. FINDINGS: The heart size and mediastinal contours are within  normal limits. Both lungs are clear. The visualized skeletal structures are unremarkable. IMPRESSION: No active cardiopulmonary disease. Electronically Signed   By: DDorise BullionIII M.D.   On: 04/21/2022 11:30    DOrson Eva DO  [facility]  If 7PM-7AM, please contact night-coverage [email or web address] Password [email or web address] 04/23/2022, 12:30 PM   LOS: 2 days

## 2022-04-23 NOTE — Assessment & Plan Note (Addendum)
Multifactorial including sepsis/hypotension, obstruction, hypovolemia -baseline creat 0.9-1.0 -serum creatinine peaked 1.76>>1.60>>1.66>>1.72 -overall slowly improving -continue IVF>>saline lock as pt is not fluid overloaded -urology consulted>>discussed with Dr. McKenzie>>s/p left ureteral stent 7/19

## 2022-04-24 ENCOUNTER — Other Ambulatory Visit: Payer: Self-pay

## 2022-04-24 ENCOUNTER — Inpatient Hospital Stay (HOSPITAL_COMMUNITY): Payer: MEDICARE

## 2022-04-24 DIAGNOSIS — G893 Neoplasm related pain (acute) (chronic): Secondary | ICD-10-CM | POA: Diagnosis not present

## 2022-04-24 DIAGNOSIS — C652 Malignant neoplasm of left renal pelvis: Secondary | ICD-10-CM | POA: Diagnosis not present

## 2022-04-24 DIAGNOSIS — N135 Crossing vessel and stricture of ureter without hydronephrosis: Secondary | ICD-10-CM | POA: Diagnosis not present

## 2022-04-24 DIAGNOSIS — N179 Acute kidney failure, unspecified: Secondary | ICD-10-CM | POA: Diagnosis not present

## 2022-04-24 DIAGNOSIS — N10 Acute tubulo-interstitial nephritis: Secondary | ICD-10-CM | POA: Diagnosis not present

## 2022-04-24 DIAGNOSIS — A419 Sepsis, unspecified organism: Secondary | ICD-10-CM | POA: Diagnosis not present

## 2022-04-24 DIAGNOSIS — R109 Unspecified abdominal pain: Secondary | ICD-10-CM | POA: Diagnosis not present

## 2022-04-24 DIAGNOSIS — J9601 Acute respiratory failure with hypoxia: Secondary | ICD-10-CM

## 2022-04-24 LAB — GLUCOSE, CAPILLARY
Glucose-Capillary: 131 mg/dL — ABNORMAL HIGH (ref 70–99)
Glucose-Capillary: 169 mg/dL — ABNORMAL HIGH (ref 70–99)
Glucose-Capillary: 132 mg/dL — ABNORMAL HIGH (ref 70–99)
Glucose-Capillary: 134 mg/dL — ABNORMAL HIGH (ref 70–99)

## 2022-04-24 LAB — CBC WITH DIFFERENTIAL/PLATELET
Abs Immature Granulocytes: 0.38 10*3/uL — ABNORMAL HIGH (ref 0.00–0.07)
Basophils Absolute: 0.1 10*3/uL (ref 0.0–0.1)
Basophils Relative: 0 %
Eosinophils Absolute: 0.4 10*3/uL (ref 0.0–0.5)
Eosinophils Relative: 2 %
HCT: 20.7 % — ABNORMAL LOW (ref 39.0–52.0)
Hemoglobin: 6.8 g/dL — CL (ref 13.0–17.0)
Immature Granulocytes: 2 %
Lymphocytes Relative: 4 %
Lymphs Abs: 0.9 10*3/uL (ref 0.7–4.0)
MCH: 21.5 pg — ABNORMAL LOW (ref 26.0–34.0)
MCHC: 32.9 g/dL (ref 30.0–36.0)
MCV: 65.3 fL — ABNORMAL LOW (ref 80.0–100.0)
Monocytes Absolute: 1.2 10*3/uL — ABNORMAL HIGH (ref 0.1–1.0)
Monocytes Relative: 5 %
Neutro Abs: 19.5 10*3/uL — ABNORMAL HIGH (ref 1.7–7.7)
Neutrophils Relative %: 87 %
Platelets: 316 10*3/uL (ref 150–400)
RBC: 3.17 MIL/uL — ABNORMAL LOW (ref 4.22–5.81)
RDW: 17.2 % — ABNORMAL HIGH (ref 11.5–15.5)
WBC: 22.4 10*3/uL — ABNORMAL HIGH (ref 4.0–10.5)
nRBC: 0 % (ref 0.0–0.2)

## 2022-04-24 LAB — PREPARE RBC (CROSSMATCH)

## 2022-04-24 LAB — URINALYSIS, COMPLETE (UACMP) WITH MICROSCOPIC
Bacteria, UA: NONE SEEN
Bilirubin Urine: NEGATIVE
Glucose, UA: NEGATIVE mg/dL
Ketones, ur: NEGATIVE mg/dL
Leukocytes,Ua: NEGATIVE
Nitrite: NEGATIVE
Protein, ur: NEGATIVE mg/dL
Specific Gravity, Urine: 1.006 (ref 1.005–1.030)
pH: 6 (ref 5.0–8.0)

## 2022-04-24 LAB — BASIC METABOLIC PANEL
Anion gap: 8 (ref 5–15)
BUN: 21 mg/dL (ref 8–23)
CO2: 23 mmol/L (ref 22–32)
Calcium: 8.1 mg/dL — ABNORMAL LOW (ref 8.9–10.3)
Chloride: 105 mmol/L (ref 98–111)
Creatinine, Ser: 1.66 mg/dL — ABNORMAL HIGH (ref 0.61–1.24)
GFR, Estimated: 42 mL/min — ABNORMAL LOW (ref >60)
Glucose, Bld: 160 mg/dL — ABNORMAL HIGH (ref 70–99)
Potassium: 3.7 mmol/L (ref 3.5–5.1)
Sodium: 136 mmol/L (ref 135–145)

## 2022-04-24 LAB — HEMOGLOBIN AND HEMATOCRIT, BLOOD
HCT: 21.6 % — ABNORMAL LOW (ref 39.0–52.0)
Hemoglobin: 7.1 g/dL — ABNORMAL LOW (ref 13.0–17.0)

## 2022-04-24 LAB — MAGNESIUM: Magnesium: 1.8 mg/dL (ref 1.7–2.4)

## 2022-04-24 MED ORDER — KETOROLAC TROMETHAMINE 15 MG/ML IJ SOLN
15.0000 mg | Freq: Once | INTRAMUSCULAR | Status: AC
  Administered 2022-04-24: 15 mg via INTRAVENOUS
  Filled 2022-04-24: qty 1

## 2022-04-24 MED ORDER — FUROSEMIDE 10 MG/ML IJ SOLN
40.0000 mg | Freq: Once | INTRAMUSCULAR | Status: AC
  Administered 2022-04-24: 40 mg via INTRAVENOUS
  Filled 2022-04-24: qty 4

## 2022-04-24 MED ORDER — ACETAMINOPHEN 500 MG PO TABS
1000.0000 mg | ORAL_TABLET | Freq: Once | ORAL | Status: AC
Start: 2022-04-24 — End: 2022-04-24
  Administered 2022-04-24: 1000 mg via ORAL
  Filled 2022-04-24: qty 2

## 2022-04-24 MED ORDER — HYDROMORPHONE HCL 2 MG PO TABS
4.0000 mg | ORAL_TABLET | ORAL | Status: DC | PRN
  Administered 2022-04-24 – 2022-04-28 (×13): 4 mg via ORAL
  Filled 2022-04-24: qty 2
  Filled 2022-04-24 (×2): qty 1
  Filled 2022-04-24 (×2): qty 2
  Filled 2022-04-24 (×2): qty 1
  Filled 2022-04-24 (×2): qty 2
  Filled 2022-04-24: qty 1
  Filled 2022-04-24: qty 2
  Filled 2022-04-24: qty 1
  Filled 2022-04-24: qty 2

## 2022-04-24 MED ORDER — SODIUM CHLORIDE 0.9% IV SOLUTION
Freq: Once | INTRAVENOUS | Status: AC
Start: 2022-04-24 — End: 2022-04-24

## 2022-04-24 NOTE — H&P (View-Only) (Signed)
Urology Consult  Referring physician: Dr. Carles Collet Reason for referral: Left flank pain, left hydronephrosis  Chief Complaint: Left flank pain  History of Present Illness: Joshua Osborne is a 81yo admitted with fever, left flank pain 7/14. 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 Lovena Neighbours. He is currently afebrile. Urine culture showed no growth. He has left flank pain that is sharp, intermittent moderate and nonraditing. No nausea/vomiting. No significant LUTS. He pain is controlled with IV dilaudid but the pain control lasts 2-3 hours. No other associated symptoms. No other exacerbating/alleviating events.   Past Medical History:  Diagnosis Date   Bladder tumor    Cancer ([place])    Diabetes mellitus without complication ([place])    Past Surgical History:  Procedure Laterality Date   LIVER BIOPSY      Medications: I have reviewed the patient's current medications. Allergies: No Known Allergies  History reviewed. No pertinent family history. Social History:  reports that he has quit smoking. His smoking use included cigarettes. He has never used smokeless tobacco. He reports that he does not currently use alcohol. No history on file for drug use.  Review of Systems  Genitourinary:  Positive for flank pain.  All other systems reviewed and are negative.   Physical Exam:  Vital signs in last 24 hours: Temp:  [98.3 F (36.8 C)-99.8 F (37.7 C)] 99 F (37.2 C) (07/17 1438) Pulse Rate:  [56-120] 56 (07/17 1438) Resp:  [14-26] 17 (07/17 1438) BP: (124-160)/(37-80) 143/57 (07/17 1438) SpO2:  [89 %-98 %] 92 % (07/17 1438) Weight:  [82.2 kg] 82.2 kg (07/17 0500) Physical Exam Constitutional:      Appearance: Normal appearance.  HENT:     Head: Normocephalic and atraumatic.     Nose: Nose normal.      Mouth/Throat:     Mouth: Mucous membranes are dry.  Eyes:     Extraocular Movements: Extraocular movements intact.     Pupils: Pupils are equal, round, and reactive to light.  Cardiovascular:     Rate and Rhythm: Normal rate and regular rhythm.  Pulmonary:     Effort: Pulmonary effort is normal. No respiratory distress.  Abdominal:     General: Abdomen is flat. There is no distension.  Musculoskeletal:        General: No swelling. Normal range of motion.     Cervical back: Normal range of motion and neck supple.  Skin:    General: Skin is warm and dry.  Neurological:     General: No focal deficit present.     Mental Status: He is alert and oriented to person, place, and time.  Psychiatric:        Mood and Affect: Mood normal.        Behavior: Behavior normal.        Thought Content: Thought content normal.        Judgment: Judgment normal.     Laboratory Data:  Results for orders placed or performed during the hospital encounter of 04/21/22 (from the past 72 hour(s))  MRSA Next Gen by PCR, Nasal     Status: None   Collection Time: 04/21/22  3:39 PM   Specimen: Nasal Mucosa; Nasal Swab  Result Value Ref Range   MRSA by PCR Next Gen NOT DETECTED NOT DETECTED    Comment: (  NOTE) The GeneXpert MRSA Assay (FDA approved for NASAL specimens only), is one component of a comprehensive MRSA colonization surveillance program. It is not intended to diagnose MRSA infection nor to guide or monitor treatment for MRSA infections. Test performance is not FDA approved in patients less than 58 years old. Performed at [facility], [address], [place], [place] [address]   Glucose, capillary     Status: Abnormal   Collection Time: 04/21/22  5:02 PM  Result Value Ref Range   Glucose-Capillary 184 (H) 70 - 99 mg/dL    Comment: Glucose reference range applies only to samples taken after fasting for at least 8 hours.  Glucose, capillary     Status: Abnormal   Collection Time: 04/21/22  9:35  PM  Result Value Ref Range   Glucose-Capillary 157 (H) 70 - 99 mg/dL    Comment: Glucose reference range applies only to samples taken after fasting for at least 8 hours.  Comprehensive metabolic panel     Status: Abnormal   Collection Time: 04/22/22  3:50 AM  Result Value Ref Range   Sodium 136 135 - 145 mmol/L   Potassium 4.3 3.5 - 5.1 mmol/L   Chloride 105 98 - 111 mmol/L   CO2 21 (L) 22 - 32 mmol/L   Glucose, Bld 163 (H) 70 - 99 mg/dL    Comment: Glucose reference range applies only to samples taken after fasting for at least 8 hours.   BUN 24 (H) 8 - 23 mg/dL   Creatinine, Ser 1.65 (H) 0.61 - 1.24 mg/dL   Calcium 8.0 (L) 8.9 - 10.3 mg/dL   Total Protein 6.4 (L) 6.5 - 8.1 g/dL   Albumin 3.0 (L) 3.5 - 5.0 g/dL   AST 30 15 - 41 U/L   ALT 39 0 - 44 U/L   Alkaline Phosphatase 134 (H) 38 - 126 U/L   Total Bilirubin 1.1 0.3 - 1.2 mg/dL   GFR, Estimated 42 (L) >60 mL/min    Comment: (NOTE) Calculated using the CKD-EPI Creatinine Equation (2021)    Anion gap 10 5 - 15    Comment: Performed at [facility], [address] [place], [place] [address]  Glucose, capillary     Status: Abnormal   Collection Time: 04/22/22  7:41 AM  Result Value Ref Range   Glucose-Capillary 168 (H) 70 - 99 mg/dL    Comment: Glucose reference range applies only to samples taken after fasting for at least 8 hours.  CBC     Status: Abnormal   Collection Time: 04/22/22  9:54 AM  Result Value Ref Range   WBC 41.6 (H) 4.0 - 10.5 K/uL   RBC 3.37 (L) 4.22 - 5.81 MIL/uL   Hemoglobin 6.9 (LL) 13.0 - 17.0 g/dL    Comment: Reticulocyte Hemoglobin testing may be clinically indicated, consider ordering this additional test [id number] THIS CRITICAL RESULT HAS VERIFIED AND BEEN CALLED TO K.DUFFY BY TRENA MCCLAIN STEPHENS ON 07 15 2023 AT 1016, AND HAS BEEN READ BACK.     HCT 21.7 (L) 39.0 - 52.0 %   MCV 64.4 (L) 80.0 - 100.0 fL   MCH 20.5 (L) 26.0 - 34.0 pg   MCHC 31.8 30.0 - 36.0 g/dL   RDW 15.1 11.5 - 15.5 %    Platelets 329 150 - 400 K/uL   nRBC 0.0 0.0 - 0.2 %    Comment: Performed at [facility], [address] [place], [place] [address]  ABO/Rh     Status: None  Collection Time: 04/22/22  9:54 AM  Result Value Ref Range   ABO/RH(D)      A POS Performed at [facility], [address] [place], [place] [address]   Prepare RBC (crossmatch)     Status: None   Collection Time: 04/22/22 10:56 AM  Result Value Ref Range   Order Confirmation      ORDER PROCESSED BY BLOOD BANK Performed at [facility], [address] [place], [place] [address]   Type and screen [facility]     Status: None (Preliminary result)   Collection Time: 04/22/22 11:09 AM  Result Value Ref Range   ABO/RH(D) A POS    Antibody Screen NEG    Sample Expiration 04/25/2022,2359    Unit Number [id number]    Blood Component Type RED CELLS,LR    Unit division 00    Status of Unit ISSUED,FINAL    Transfusion Status OK TO TRANSFUSE    Crossmatch Result Compatible    Unit Number [id number]    Blood Component Type RED CELLS,LR    Unit division 00    Status of Unit ISSUED    Transfusion Status OK TO TRANSFUSE    Crossmatch Result      Compatible Performed at [facility], [address] [place], [place] [address]   Prepare RBC (crossmatch)     Status: None   Collection Time: 04/22/22 11:09 AM  Result Value Ref Range   Order Confirmation      ORDER PROCESSED BY BLOOD BANK Performed at [facility], [address], [place], [place] [address]   Glucose, capillary     Status: Abnormal   Collection Time: 04/22/22 11:30 AM  Result Value Ref Range   Glucose-Capillary 195 (H) 70 - 99 mg/dL    Comment: Glucose reference range applies only to samples taken after fasting for at least 8 hours.  Glucose, capillary     Status: Abnormal   Collection Time: 04/22/22  4:19 PM  Result Value Ref Range   Glucose-Capillary 121 (H) 70 - 99 mg/dL    Comment: Glucose reference range applies only to samples taken after  fasting for at least 8 hours.  Hemoglobin and hematocrit, blood     Status: Abnormal   Collection Time: 04/22/22 10:21 PM  Result Value Ref Range   Hemoglobin 7.0 (L) 13.0 - 17.0 g/dL   HCT 21.9 (L) 39.0 - 52.0 %    Comment: Performed at [facility], [address] [place], [place] [address]  Glucose, capillary     Status: Abnormal   Collection Time: 04/22/22 10:21 PM  Result Value Ref Range   Glucose-Capillary 175 (H) 70 - 99 mg/dL    Comment: Glucose reference range applies only to samples taken after fasting for at least 8 hours.  CBC with Differential/Platelet     Status: Abnormal   Collection Time: 04/23/22  4:13 AM  Result Value Ref Range   WBC 32.2 (H) 4.0 - 10.5 K/uL   RBC 3.58 (L) 4.22 - 5.81 MIL/uL   Hemoglobin 7.6 (L) 13.0 - 17.0 g/dL    Comment: Reticulocyte Hemoglobin testing may be clinically indicated, consider ordering this additional test [id number]    HCT 23.8 (L) 39.0 - 52.0 %   MCV 66.5 (L) 80.0 - 100.0 fL   MCH 21.2 (L) 26.0 - 34.0 pg   MCHC 31.9 30.0 - 36.0 g/dL   RDW 17.2 (H) 11.5 - 15.5 %   Platelets 316 150 - 400 K/uL   nRBC  0.0 0.0 - 0.2 %   Neutrophils Relative % 90 %   Neutro Abs 28.8 (H) 1.7 - 7.7 K/uL   Lymphocytes Relative 3 %   Lymphs Abs 0.8 0.7 - 4.0 K/uL   Monocytes Relative 5 %   Monocytes Absolute 1.6 (H) 0.1 - 1.0 K/uL   Eosinophils Relative 0 %   Eosinophils Absolute 0.1 0.0 - 0.5 K/uL   Basophils Relative 0 %   Basophils Absolute 0.1 0.0 - 0.1 K/uL   Immature Granulocytes 2 %   Abs Immature Granulocytes 0.74 (H) 0.00 - 0.07 K/uL    Comment: Performed at [facility], [address], [place], [place] [address]  Comprehensive metabolic panel     Status: Abnormal   Collection Time: 04/23/22  4:13 AM  Result Value Ref Range   Sodium 135 135 - 145 mmol/L   Potassium 3.7 3.5 - 5.1 mmol/L   Chloride 102 98 - 111 mmol/L   CO2 23 22 - 32 mmol/L   Glucose, Bld 167 (H) 70 - 99 mg/dL    Comment: Glucose reference range applies only to  samples taken after fasting for at least 8 hours.   BUN 21 8 - 23 mg/dL   Creatinine, Ser 1.60 (H) 0.61 - 1.24 mg/dL   Calcium 8.3 (L) 8.9 - 10.3 mg/dL   Total Protein 6.4 (L) 6.5 - 8.1 g/dL   Albumin 2.9 (L) 3.5 - 5.0 g/dL   AST 33 15 - 41 U/L   ALT 42 0 - 44 U/L   Alkaline Phosphatase 150 (H) 38 - 126 U/L   Total Bilirubin 1.2 0.3 - 1.2 mg/dL   GFR, Estimated 44 (L) >60 mL/min    Comment: (NOTE) Calculated using the CKD-EPI Creatinine Equation (2021)    Anion gap 10 5 - 15    Comment: Performed at [facility], [address], [place], [place] [address]  Magnesium     Status: Abnormal   Collection Time: 04/23/22  4:13 AM  Result Value Ref Range   Magnesium 1.4 (L) 1.7 - 2.4 mg/dL    Comment: Performed at [facility], [address], [place], [place] [address]  Glucose, capillary     Status: Abnormal   Collection Time: 04/23/22  7:35 AM  Result Value Ref Range   Glucose-Capillary 142 (H) 70 - 99 mg/dL    Comment: Glucose reference range applies only to samples taken after fasting for at least 8 hours.  Glucose, capillary     Status: Abnormal   Collection Time: 04/23/22 11:34 AM  Result Value Ref Range   Glucose-Capillary 162 (H) 70 - 99 mg/dL    Comment: Glucose reference range applies only to samples taken after fasting for at least 8 hours.  Glucose, capillary     Status: Abnormal   Collection Time: 04/23/22  4:11 PM  Result Value Ref Range   Glucose-Capillary 166 (H) 70 - 99 mg/dL    Comment: Glucose reference range applies only to samples taken after fasting for at least 8 hours.  Glucose, capillary     Status: Abnormal   Collection Time: 04/23/22  9:59 PM  Result Value Ref Range   Glucose-Capillary 158 (H) 70 - 99 mg/dL    Comment: Glucose reference range applies only to samples taken after fasting for at least 8 hours.   Comment 1 Notify RN    Comment 2 Document in Chart   CBC with Differential/Platelet     Status: Abnormal   Collection Time: 04/24/22  3:52 AM   Result Value Ref Range   WBC 22.4 (H) 4.0 - 10.5 K/uL   RBC 3.17 (L) 4.22 - 5.81 MIL/uL   Hemoglobin 6.8 (LL) 13.0 - 17.0 g/dL    Comment: Reticulocyte Hemoglobin testing may be clinically indicated, consider ordering this additional test [id number] THIS CRITICAL RESULT HAS VERIFIED AND BEEN CALLED TO ROBERT MAYNARD BY TRENA MCCLAIN STEPHENS ON 07 17 2023 AT 0536, AND HAS BEEN READ BACK.     HCT 20.7 (L) 39.0 - 52.0 %   MCV 65.3 (L) 80.0 - 100.0 fL   MCH 21.5 (L) 26.0 - 34.0 pg   MCHC 32.9 30.0 - 36.0 g/dL   RDW 17.2 (H) 11.5 - 15.5 %   Platelets 316 150 - 400 K/uL   nRBC 0.0 0.0 - 0.2 %   Neutrophils Relative % 87 %   Neutro Abs 19.5 (H) 1.7 - 7.7 K/uL   Lymphocytes Relative 4 %   Lymphs Abs 0.9 0.7 - 4.0 K/uL   Monocytes Relative 5 %   Monocytes Absolute 1.2 (H) 0.1 - 1.0 K/uL   Eosinophils Relative 2 %   Eosinophils Absolute 0.4 0.0 - 0.5 K/uL   Basophils Relative 0 %   Basophils Absolute 0.1 0.0 - 0.1 K/uL   Immature Granulocytes 2 %   Abs Immature Granulocytes 0.38 (H) 0.00 - 0.07 K/uL    Comment: Performed at [facility], [address], [place], [place] [address]  Basic metabolic panel     Status: Abnormal   Collection Time: 04/24/22  3:52 AM  Result Value Ref Range   Sodium 136 135 - 145 mmol/L   Potassium 3.7 3.5 - 5.1 mmol/L   Chloride 105 98 - 111 mmol/L   CO2 23 22 - 32 mmol/L   Glucose, Bld 160 (H) 70 - 99 mg/dL    Comment: Glucose reference range applies only to samples taken after fasting for at least 8 hours.   BUN 21 8 - 23 mg/dL   Creatinine, Ser 1.66 (H) 0.61 - 1.24 mg/dL   Calcium 8.1 (L) 8.9 - 10.3 mg/dL   GFR, Estimated 42 (L) >60 mL/min    Comment: (NOTE) Calculated using the CKD-EPI Creatinine Equation (2021)    Anion gap 8 5 - 15    Comment: Performed at [facility], [address] [place], [place] [address]  Magnesium     Status: None   Collection Time: 04/24/22  3:52 AM  Result Value Ref Range   Magnesium 1.8 1.7 - 2.4 mg/dL    Comment:  Performed at [facility], [address], [place], [place] [address]  Hemoglobin and hematocrit, blood     Status: Abnormal   Collection Time: 04/24/22  6:26 AM  Result Value Ref Range   Hemoglobin 7.1 (L) 13.0 - 17.0 g/dL   HCT 21.6 (L) 39.0 - 52.0 %    Comment: Performed at [facility], [address], [place], [place] [address]  Glucose, capillary     Status: Abnormal   Collection Time: 04/24/22  7:42 AM  Result Value Ref Range   Glucose-Capillary 131 (H) 70 - 99 mg/dL    Comment: Glucose reference range applies only to samples taken after fasting for at least 8 hours.  Glucose, capillary     Status: Abnormal   Collection Time: 04/24/22 11:15 AM  Result Value Ref Range   Glucose-Capillary 169 (H) 70 - 99 mg/dL    Comment: Glucose reference range applies only to samples taken after  fasting for at least 8 hours.   Recent Results (from the past 240 hour(s))  Culture, blood (Routine x 2)     Status: None (Preliminary result)   Collection Time: 04/21/22 11:17 AM   Specimen: BLOOD RIGHT ARM  Result Value Ref Range Status   Specimen Description   Final    BLOOD RIGHT ARM BOTTLES DRAWN AEROBIC AND ANAEROBIC   Special Requests   Final    Blood Culture results may not be optimal due to an excessive volume of blood received in culture bottles   Culture   Final    NO GROWTH 3 DAYS Performed at [facility], [address], [place], [place] [address]    Report Status PENDING  Incomplete  Culture, blood (Routine x 2)     Status: None (Preliminary result)   Collection Time: 04/21/22 11:17 AM   Specimen: BLOOD LEFT ARM  Result Value Ref Range Status   Specimen Description BLOOD LEFT ARM BOTTLES DRAWN AEROBIC AND ANAEROBIC  Final   Special Requests Blood Culture adequate volume  Final   Culture   Final    NO GROWTH 3 DAYS Performed at [facility], [address], [place], [place] [address]    Report Status PENDING  Incomplete  Urine Culture     Status: None   Collection Time: 04/21/22  11:49 AM   Specimen: Urine, Clean Catch  Result Value Ref Range Status   Specimen Description   Final    URINE, CLEAN CATCH Performed at [facility], [address] [place], [place] [address]    Special Requests   Final    NONE Performed at [facility], [address] [place], [place] [address]    Culture   Final    NO GROWTH Performed at [facility], [place] [address], [place], [place] [address]    Report Status 04/23/2022 FINAL  Final  MRSA Next Gen by PCR, Nasal     Status: None   Collection Time: 04/21/22  3:39 PM   Specimen: Nasal Mucosa; Nasal Swab  Result Value Ref Range Status   MRSA by PCR Next Gen NOT DETECTED NOT DETECTED Final    Comment: (NOTE) The GeneXpert MRSA Assay (FDA approved for NASAL specimens only), is one component of a comprehensive MRSA colonization surveillance program. It is not intended to diagnose MRSA infection nor to guide or monitor treatment for MRSA infections. Test performance is not FDA approved in patients less than 67 years old. 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 I discussed ureteral stent placement versus observation and after discussing the options the patient wishes to pursue observation because he had significant stent colic after his last procedure with Dr. Lovena Neighbours. We will order dilaudid PO tabs every 3-4 hours. If this fails to control his pain we will then pursue left ureteral stent placement  [name] 04/24/2022, 3:32 PM

## 2022-04-24 NOTE — Progress Notes (Addendum)
PROGRESS NOTE  Joshua Osborne DQQ:[id number] DOB: Jun 11, 1941 DOA: 04/21/2022 PCP: Earney Mallet, MD  Brief History:  81 year old male with a history of depression, diabetes mellitus, urothelial carcinoma, hyperlipidemia presenting with 2-week history of generalized weakness.  In the past couple days, the patient has had fevers and chills.  He actually had shivering and rigors on the morning of 04/21/2022.  He has had poor oral intake.  His wife took his temperature on the morning of 04/21/2022 and it was 100.0 F.  He went to see his urologist, Dr. Lovena Neighbours, on 04/20/2022.  He was diagnosed with a UTI and started on cephalexin.  He took 2 doses prior to coming to the hospital.  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  He is scheduled tentatively for a laparoscopic left-sided nephroureterectomy on 05/30/2022.  He had 2 episodes of nausea and vomiting prior to coming to the hospital on the morning of admission.  He denies any abdominal pain but complains of left-sided flank pain which has worsened over the last couple days prior to admission.  He has been taking oxycodone 5 mg approximately twice daily since his renal biopsy.  He denies any headache, neck pain, chest pain, shortness of breath, cough, hemoptysis.  There is no diarrhea, hematochezia, melena, abdominal pain.  He has some intermittent dysuria. In the ED, the patient was febrile up to 101.7 F.  He was tachycardic into the 140s.  He was hemodynamically stable.  WBC 34.4, hemoglobin 8.4, platelets 266,000.  Sodium 133, potassium 4.5, bicarbonate 23, serum creatinine 1.76.  AST 32, ALT 47, ALP 157, total bilirubin 1.3.  UA shows greater than 50 WBC, 21-50 RBC.  Lactic acid 1.2.  CT of the abdomen and pelvis showed increased left hydronephrosis with hydroureter secondary to persistent soft tissue within the left renal collecting system consistent with tumor extending to the UPJ.   There is nonobstructive left renal calculi.  The patient was given the appropriate fluid boluses and started on ceftriaxone.  He was admitted for further evaluation and treatment.     Assessment and Plan: * Septic shock ([place]) Presented with fever, tachycardia, leukocytosis Required low dose levophed overnight 7/14-7/15>>weaned off Lactate peaked 1.2 UA>50 WBC Follow blood and urine cultures--negative to date but pt was on cephalexin prior to admission CXR--no infiltrates Antibiotics broaden to zosyn WBC up to 41.6>>32.2 -still having fevers, but overall trending down Continue IVF>>saline lock on 7/17 due to fluid overload  Acute pyelonephritis WBC up 41.6,>>32.2 --fever curve is improving>>spiked fever 7/17 around 1745 --pt clinically stable --broaden abx pending culture data>>zosyn started 7/15 --urine culture is neg but pt was on cephalexin prior to admission -04/24/22--repeat blood and urine culture  AKI (acute kidney injury) ([place]) Multifactorial including sepsis/hypotension, obstruction, hypovolemia -baseline creatiint 0.9-1.0 -serum creatinine peaked 1.76>>1.60>>1.66 -overall slowly improving -continue IVF>>saline lock as pt is not fluid overloaed -urology consulted>>discussed with Dr. Alyson Ingles  Transitional cell carcinoma of renal pelvis, left ([place]) -PET scan (03/16/2022): Soft tissue mass within the left renal pelvis, SUV 6.7.  No hypermetabolic lymph nodes.  No liver lesions hypermetabolic. -He underwent ureteroscopy and biopsy on 03/27/2022. 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  Muscularis propria not identified in the current specimen --laparoscopic left nephroureterectomy tentatively planned 05/30/22 --appreciate Dr. Alyson Ingles consult  Acute respiratory failure with hypoxia ([place]) 7/17 developed hypoxia and tachypnea -stable on 2-3L -due to pulm edema -personally reviewed 7/17 CXR--pulm edema,  vasc congestion -saline lock IVF -lasix IV  40 mg x 1  Hypomagnesemia replete  Cancer related pain Left flank pain not well controlled D/c oxycodone Start IV dilaudid>>increase to 1 mg q 3 hrs prn Changed back to po dilaudid after discussion with Dr. 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  No hypermetabolic lymph nodes.  No liver lesions hypermetabolic. -He underwent ureteroscopy and biopsy on 03/27/2022. 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  Muscularis propria not identified in the current specimen --laparoscopic left nephroureterectomy and TURBT tentatively planned 05/30/22  Symptomatic anemia Hgb dropped to 6.9 Likely dilution Transfused one unit PRBC 7/15 -transfused one unit PRBC 7/17       Family Communication:   spouse updated 7/17  Consultants:  urology  Code Status:  FULL   DVT Prophylaxis:  [place] Lovenox   Procedures: As Listed in Progress Note Above  Antibiotics: Ceftriaxone 7/14>>7/15 Zosyn 7/15>>   The patient is critically ill with multiple organ systems failure and requires high complexity decision making for assessment and support, frequent evaluation and titration of therapies, application of advanced monitoring technologies and extensive interpretation of multiple databases.  Critical care time - 45 mins.     Subjective: States he is sob this afternoon.  Denies n/v/d, abd pain.  Flank pain is controlled with IV dilaudid.  Denies headache, neck pain, cp, hemoptysis  Objective: Vitals:   04/24/22 1740 04/24/22 1741 04/24/22 1800 04/24/22 1850  BP: (!) 181/70 (!) 184/67 (!) 160/63   Pulse: (!) 102 (!) 107 (!) 102   Resp: (!) 28 (!) 22 (!) 23   Temp: (!)  103.1 F (39.5 C)     TempSrc: Oral     SpO2: 91% 93% 98% 94%  Weight:      Height:        Intake/Output Summary (Last 24 hours) at 04/24/2022 1902 Last data filed at 04/24/2022 1438 Gross per 24 hour  Intake 1399.37 ml  Output 550 ml  Net 849.37 ml   Weight change: -0.1 kg Exam:  General:  Pt is alert, follows commands appropriately, not in acute distress HEENT: No icterus, No thrush, No neck mass, [place]/AT Cardiovascular: RRR, S1/S2, no rubs, no gallops Respiratory: bilateral crackles. Abdomen: Soft/+BS, non tender, non distended, no guarding Extremities: No edema, No lymphangitis, No petechiae, No rashes, no synovitis   Data Reviewed: I have personally reviewed following labs and imaging studies Basic Metabolic Panel: Recent Labs  Lab 04/21/22 1114 04/22/22 0350 04/23/22 0413 04/24/22 0352  NA 133* 136 135 136  K 4.5 4.3 3.7 3.7  CL 100 105 102 105  CO2 23 21* 23 23  GLUCOSE 183* 163* 167* 160*  BUN 32* 24* 21 21  CREATININE 1.76* 1.65* 1.60* 1.66*  CALCIUM 9.0 8.0* 8.3* 8.1*  MG  --   --  1.4* 1.8   Liver Function Tests: Recent Labs  Lab 04/21/22 1114 04/22/22 0350 04/23/22 0413  AST 32 30 33  ALT 47* 39 42  ALKPHOS 157* 134* 150*  BILITOT 1.3* 1.1 1.2  PROT 7.7 6.4* 6.4*  ALBUMIN 3.7 3.0* 2.9*   No results for input(s): "LIPASE", "AMYLASE" in the last 168 hours. No results for input(s): "AMMONIA" in the last 168 hours. Coagulation Profile: Recent Labs  Lab 04/21/22 1114  INR 1.2   CBC: Recent Labs  Lab 04/21/22 1114 04/22/22 0954 04/22/22 2221 04/23/22 0413 04/24/22 0352 04/24/22 0626 04/24/22 1642  WBC 34.4* 41.6*  --  32.2* 22.4*  --   --   NEUTROABS 31.1*  --   --  28.8* 19.5*  --   --   HGB 8.4* 6.9* 7.0* 7.6* 6.8* 7.1* 9.4*  HCT 26.2* 21.7* 21.9* 23.8* 20.7* 21.6* 28.3*  MCV 64.2* 64.4*  --  66.5* 65.3*  --   --   PLT 366 329  --  316 316  --   --    Cardiac Enzymes: No results for input(s): "CKTOTAL", "CKMB", "CKMBINDEX",  "TROPONINI" in the last 168 hours. BNP: Invalid input(s): "POCBNP" CBG: Recent Labs  Lab 04/23/22 1611 04/23/22 2159 04/24/22 0742 04/24/22 1115 04/24/22 1624  GLUCAP 166* 158* 131* 169* 132*   HbA1C: No results for input(s): "HGBA1C" in the last 72 hours. Urine analysis:    Component Value Date/Time   COLORURINE YELLOW 04/21/2022 1055   APPEARANCEUR CLOUDY (A) 04/21/2022 1055   LABSPEC 1.018 04/21/2022 1055   PHURINE 5.0 04/21/2022 1055   GLUCOSEU NEGATIVE 04/21/2022 1055   HGBUR MODERATE (A) 04/21/2022 1055   BILIRUBINUR NEGATIVE 04/21/2022 1055   KETONESUR 20 (A) 04/21/2022 1055   PROTEINUR 100 (A) 04/21/2022 1055   NITRITE NEGATIVE 04/21/2022 1055   LEUKOCYTESUR MODERATE (A) 04/21/2022 1055   Sepsis Labs: '@LABRCNTIP'$ (procalcitonin:4,lacticidven:4) ) Recent Results (from the past 240 hour(s))  Culture, blood (Routine x 2)     Status: None (Preliminary result)   Collection Time: 04/21/22 11:17 AM   Specimen: BLOOD RIGHT ARM  Result Value Ref Range Status   Specimen Description   Final    BLOOD RIGHT ARM BOTTLES DRAWN AEROBIC AND ANAEROBIC   Special Requests   Final    Blood Culture results may not be optimal due to an excessive volume of blood received in culture bottles   Culture   Final    NO GROWTH 3 DAYS Performed at [facility], [address] [place], [place] [address]    Report Status PENDING  Incomplete  Culture, blood (Routine x 2)     Status: None (Preliminary result)   Collection Time: 04/21/22 11:17 AM   Specimen: BLOOD LEFT ARM  Result Value Ref Range Status   Specimen Description BLOOD LEFT ARM BOTTLES DRAWN AEROBIC AND ANAEROBIC  Final   Special Requests Blood Culture adequate volume  Final   Culture   Final    NO GROWTH 3 DAYS Performed at [facility], [address] [place], [place] [address]    Report Status PENDING  Incomplete  Urine Culture     Status: None   Collection Time: 04/21/22 11:49 AM   Specimen: Urine, Clean Catch  Result  Value Ref Range Status   Specimen Description   Final    URINE, CLEAN CATCH Performed at [facility], [address], [place], [place] [address]    Special Requests   Final    NONE Performed at [facility], [address] [place], [place] [address]    Culture   Final    NO GROWTH Performed at [facility], [place] [address] [place], [place] [address]    Report Status 04/23/2022 FINAL  Final  MRSA Next Gen by PCR, Nasal     Status: None   Collection Time: 04/21/22  3:39 PM   Specimen: Nasal Mucosa; Nasal Swab  Result Value Ref Range Status  MRSA by PCR Next Gen NOT DETECTED NOT DETECTED Final    Comment: (NOTE) The GeneXpert MRSA Assay (FDA approved for NASAL specimens only), is one component of a comprehensive MRSA colonization surveillance program. It is not intended to diagnose MRSA infection nor to guide or monitor treatment for MRSA infections. Test performance is not FDA approved in patients less than 30 years old. Performed at [facility], [address] [place], [place] [address]      Scheduled Meds:  Chlorhexidine Gluconate Cloth  6 each Topical Daily   citalopram  10 mg Oral Daily   enoxaparin (LOVENOX) injection  40 mg Subcutaneous Q24H   feeding supplement  237 mL Oral TID BM   insulin aspart  0-9 Units Subcutaneous TID WC   latanoprost  1 drop Both Eyes QHS   multivitamin with minerals  1 tablet Oral Daily   niacin  500 mg Oral QHS   pantoprazole  40 mg Oral Daily   tamsulosin  0.4 mg Oral Daily   tiZANidine  2 mg Oral QHS   Continuous Infusions:  sodium chloride     piperacillin-tazobactam (ZOSYN)  IV 3.375 g (04/24/22 1437)    Procedures/Studies: DG CHEST PORT 1 VIEW  Result Date: 04/24/2022 CLINICAL DATA:  Fever, chills and shortness of breath EXAM: PORTABLE CHEST 1 VIEW COMPARISON:  04/21/2022 FINDINGS: Heart size remains normal. Mediastinal shadows are unremarkable except for mild atherosclerosis of the aorta. There is new abnormal bilateral  pulmonary density in a pattern most consistent with pulmonary venous hypertension/fluid overload. Widespread developing pneumonia could have a similar appearance. IMPRESSION: Suspicion of congestive heart failure with developing edema. Developing widespread pneumonia could have a similar appearance, but is less likely. Electronically Signed   By: Nelson Chimes M.D.   On: 04/24/2022 18:53   CT ABDOMEN PELVIS W CONTRAST  Result Date: 04/21/2022 CLINICAL DATA:  LEFT flank pain and LEFT lower quadrant pain, fever, and chills for the past few days, suspected kidney stone; history of renal cell carcinoma, diverticulitis, pyelonephritis EXAM: CT ABDOMEN AND PELVIS WITH CONTRAST TECHNIQUE: Multidetector CT imaging of the abdomen and pelvis was performed using the standard protocol following bolus administration of intravenous contrast. RADIATION DOSE REDUCTION: This exam was performed according to the departmental dose-optimization program which includes automated exposure control, adjustment of the mA and/or kV according to patient size and/or use of iterative reconstruction technique. CONTRAST:  43m OMNIPAQUE IOHEXOL 300 MG/ML SOLN IV. No oral contrast. COMPARISON:  03/07/2022 Correlation: PET-CT 03/16/2022 FINDINGS: Lower chest: Lung bases clear Hepatobiliary: Calcifications with surrounding low attenuation in the RIGHT lobe of the liver question treated metastases. Post cholecystectomy. No new hepatic abnormalities. Pancreas: Atrophic pancreas without mass Spleen: Normal appearance Adrenals/Urinary Tract: Adrenal glands and RIGHT kidney normal appearance. Again identified soft tissue within LEFT renal collecting system consistent with known tumor, extending to the reader 0 pelvic junction and causing increased LEFT hydronephrosis. Nonobstructing LEFT renal calculi. LEFT ureter is dilated in its course to the urinary bladder without calcification or mass. RIGHT ureter decompressed. Bladder unremarkable.  Stomach/Bowel: Normal appendix. Diverticulosis of descending and sigmoid colon without evidence of diverticulitis. Stomach and remaining bowel loops unremarkable. Vascular/Lymphatic: Atherosclerotic calcifications aorta and iliac arteries without aneurysm. Vascular structures patent. No adenopathy. Reproductive: Significant prostatic enlargement, gland measuring 7.2 x 6.0 x 5.7 cm (volume = 130 cm^3). Seminal vesicles unremarkable. Other: No free air or free fluid. No hernia or inflammatory process. Musculoskeletal: Unremarkable IMPRESSION: Increased LEFT hydronephrosis and hydroureter secondary to persistent soft tissue within  the LEFT renal collecting system consistent with known tumor, extending to the ureteropelvic junction Nonobstructing LEFT renal calculi. Significant prostatic enlargement. Distal colonic diverticulosis without evidence of diverticulitis. 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 Aortic Atherosclerosis (ICD10-I70.0). Electronically Signed   By: Lavonia Dana M.D.   On: 04/21/2022 12:49   DG Chest 2 View  Result Date: 04/21/2022 CLINICAL DATA:  Suspected sepsis EXAM: CHEST - 2 VIEW COMPARISON:  None Available. FINDINGS: The heart size and mediastinal contours are within normal limits. Both lungs are clear. The visualized skeletal structures are unremarkable. IMPRESSION: No active cardiopulmonary disease. Electronically Signed   By: Dorise Bullion III M.D.   On: 04/21/2022 11:30    Orson Eva, DO  [facility]  If 7PM-7AM, please contact night-coverage [email or web address] Password [email or web address] 04/24/2022, 7:02 PM   LOS: 3 days

## 2022-04-24 NOTE — Progress Notes (Signed)
Patient alert to time but disoriented to place. Able to state wife's name. Dr Tat in to assess patient. Blood cultures and xray done per Dr Tat orders. Family at bedside, emotional support provided. Will continue to monitor.  IV fluids stopped per verbal from Dr Tat.

## 2022-04-24 NOTE — Progress Notes (Signed)
Pt. complaining of intermittent left shoulder/chest pain 8/10. The pain lasts for a few seconds then goes away. He is not currently experiencing pain. This is the fourth time it has happened. The first time was last night. He denies any other issues, denies SOB. Notified Dr. Carles Collet. Orders received.

## 2022-04-24 NOTE — Progress Notes (Addendum)
Responded to nursing call:  hypoxia, fever   Subjective: Pt complains of sob.  Denies cp, n/vd, abd pain.  No hemoptysis.  +FC  Vitals:   04/24/22 1500 04/24/22 1600 04/24/22 1627 04/24/22 1700  BP: (!) 146/102 (!) 148/53  106/66  Pulse: 84 84  (!) 105  Resp: 20 19  (!) 23  Temp:   98.4 F (36.9 C)   TempSrc:   Oral   SpO2: 95% 94%  (!) 86%  Weight:      Height:       CV--RRR Lung--bilateral crackles Abd--soft+BS/NT   Assessment/Plan: Acute resp failure with hypoxia -personally reviewed CXR--pulm edema, vasc congestion -stop IVF -lasix IV 40 mg x 1 -echo in am -personally reviewed EKG--sinus, nonspecific TWI V1 Fever -repeat blood culture -repeat UA/culture -acetaminophen     Orson Eva, DO [facility]

## 2022-04-24 NOTE — Consult Note (Signed)
Urology Consult  Referring physician: Dr. Carles Collet Reason for referral: Left flank pain, left hydronephrosis  Chief Complaint: Left flank pain  History of Present Illness: Joshua Osborne is a 81yo admitted with fever, left flank pain 7/14. 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 Lovena Neighbours. He is currently afebrile. Urine culture showed no growth. He has left flank pain that is sharp, intermittent moderate and nonraditing. No nausea/vomiting. No significant LUTS. He pain is controlled with IV dilaudid but the pain control lasts 2-3 hours. No other associated symptoms. No other exacerbating/alleviating events.   Past Medical History:  Diagnosis Date   Bladder tumor    Cancer ([place])    Diabetes mellitus without complication ([place])    Past Surgical History:  Procedure Laterality Date   LIVER BIOPSY      Medications: I have reviewed the patient's current medications. Allergies: No Known Allergies  History reviewed. No pertinent family history. Social History:  reports that he has quit smoking. His smoking use included cigarettes. He has never used smokeless tobacco. He reports that he does not currently use alcohol. No history on file for drug use.  Review of Systems  Genitourinary:  Positive for flank pain.  All other systems reviewed and are negative.   Physical Exam:  Vital signs in last 24 hours: Temp:  [98.3 F (36.8 C)-99.8 F (37.7 C)] 99 F (37.2 C) (07/17 1438) Pulse Rate:  [56-120] 56 (07/17 1438) Resp:  [14-26] 17 (07/17 1438) BP: (124-160)/(37-80) 143/57 (07/17 1438) SpO2:  [89 %-98 %] 92 % (07/17 1438) Weight:  [82.2 kg] 82.2 kg (07/17 0500) Physical Exam Constitutional:      Appearance: Normal appearance.  HENT:     Head: Normocephalic and atraumatic.     Nose: Nose normal.      Mouth/Throat:     Mouth: Mucous membranes are dry.  Eyes:     Extraocular Movements: Extraocular movements intact.     Pupils: Pupils are equal, round, and reactive to light.  Cardiovascular:     Rate and Rhythm: Normal rate and regular rhythm.  Pulmonary:     Effort: Pulmonary effort is normal. No respiratory distress.  Abdominal:     General: Abdomen is flat. There is no distension.  Musculoskeletal:        General: No swelling. Normal range of motion.     Cervical back: Normal range of motion and neck supple.  Skin:    General: Skin is warm and dry.  Neurological:     General: No focal deficit present.     Mental Status: He is alert and oriented to person, place, and time.  Psychiatric:        Mood and Affect: Mood normal.        Behavior: Behavior normal.        Thought Content: Thought content normal.        Judgment: Judgment normal.     Laboratory Data:  Results for orders placed or performed during the hospital encounter of 04/21/22 (from the past 72 hour(s))  MRSA Next Gen by PCR, Nasal     Status: None   Collection Time: 04/21/22  3:39 PM   Specimen: Nasal Mucosa; Nasal Swab  Result Value Ref Range   MRSA by PCR Next Gen NOT DETECTED NOT DETECTED    Comment: (  NOTE) The GeneXpert MRSA Assay (FDA approved for NASAL specimens only), is one component of a comprehensive MRSA colonization surveillance program. It is not intended to diagnose MRSA infection nor to guide or monitor treatment for MRSA infections. Test performance is not FDA approved in patients less than 36 years old. Performed at [facility], [address] [place], [place] [address]   Glucose, capillary     Status: Abnormal   Collection Time: 04/21/22  5:02 PM  Result Value Ref Range   Glucose-Capillary 184 (H) 70 - 99 mg/dL    Comment: Glucose reference range applies only to samples taken after fasting for at least 8 hours.  Glucose, capillary     Status: Abnormal   Collection Time: 04/21/22  9:35  PM  Result Value Ref Range   Glucose-Capillary 157 (H) 70 - 99 mg/dL    Comment: Glucose reference range applies only to samples taken after fasting for at least 8 hours.  Comprehensive metabolic panel     Status: Abnormal   Collection Time: 04/22/22  3:50 AM  Result Value Ref Range   Sodium 136 135 - 145 mmol/L   Potassium 4.3 3.5 - 5.1 mmol/L   Chloride 105 98 - 111 mmol/L   CO2 21 (L) 22 - 32 mmol/L   Glucose, Bld 163 (H) 70 - 99 mg/dL    Comment: Glucose reference range applies only to samples taken after fasting for at least 8 hours.   BUN 24 (H) 8 - 23 mg/dL   Creatinine, Ser 1.65 (H) 0.61 - 1.24 mg/dL   Calcium 8.0 (L) 8.9 - 10.3 mg/dL   Total Protein 6.4 (L) 6.5 - 8.1 g/dL   Albumin 3.0 (L) 3.5 - 5.0 g/dL   AST 30 15 - 41 U/L   ALT 39 0 - 44 U/L   Alkaline Phosphatase 134 (H) 38 - 126 U/L   Total Bilirubin 1.1 0.3 - 1.2 mg/dL   GFR, Estimated 42 (L) >60 mL/min    Comment: (NOTE) Calculated using the CKD-EPI Creatinine Equation (2021)    Anion gap 10 5 - 15    Comment: Performed at [facility], [address], [place], [place] [address]  Glucose, capillary     Status: Abnormal   Collection Time: 04/22/22  7:41 AM  Result Value Ref Range   Glucose-Capillary 168 (H) 70 - 99 mg/dL    Comment: Glucose reference range applies only to samples taken after fasting for at least 8 hours.  CBC     Status: Abnormal   Collection Time: 04/22/22  9:54 AM  Result Value Ref Range   WBC 41.6 (H) 4.0 - 10.5 K/uL   RBC 3.37 (L) 4.22 - 5.81 MIL/uL   Hemoglobin 6.9 (LL) 13.0 - 17.0 g/dL    Comment: Reticulocyte Hemoglobin testing may be clinically indicated, consider ordering this additional test [id number] THIS CRITICAL RESULT HAS VERIFIED AND BEEN CALLED TO K.DUFFY BY TRENA MCCLAIN STEPHENS ON 07 15 2023 AT 1016, AND HAS BEEN READ BACK.     HCT 21.7 (L) 39.0 - 52.0 %   MCV 64.4 (L) 80.0 - 100.0 fL   MCH 20.5 (L) 26.0 - 34.0 pg   MCHC 31.8 30.0 - 36.0 g/dL   RDW 15.1 11.5 - 15.5 %    Platelets 329 150 - 400 K/uL   nRBC 0.0 0.0 - 0.2 %    Comment: Performed at [facility], [address], [place], [place] [address]  ABO/Rh     Status: None  Collection Time: 04/22/22  9:54 AM  Result Value Ref Range   ABO/RH(D)      A POS Performed at [facility], [address] [place], [place] [address]   Prepare RBC (crossmatch)     Status: None   Collection Time: 04/22/22 10:56 AM  Result Value Ref Range   Order Confirmation      ORDER PROCESSED BY BLOOD BANK Performed at [facility], [address] [place], [place] [address]   Type and screen [facility]     Status: None (Preliminary result)   Collection Time: 04/22/22 11:09 AM  Result Value Ref Range   ABO/RH(D) A POS    Antibody Screen NEG    Sample Expiration 04/25/2022,2359    Unit Number [id number]    Blood Component Type RED CELLS,LR    Unit division 00    Status of Unit ISSUED,FINAL    Transfusion Status OK TO TRANSFUSE    Crossmatch Result Compatible    Unit Number [id number]    Blood Component Type RED CELLS,LR    Unit division 00    Status of Unit ISSUED    Transfusion Status OK TO TRANSFUSE    Crossmatch Result      Compatible Performed at [facility], [address] [place], [place] [address]   Prepare RBC (crossmatch)     Status: None   Collection Time: 04/22/22 11:09 AM  Result Value Ref Range   Order Confirmation      ORDER PROCESSED BY BLOOD BANK Performed at [facility], [address] [place], [place] [address]   Glucose, capillary     Status: Abnormal   Collection Time: 04/22/22 11:30 AM  Result Value Ref Range   Glucose-Capillary 195 (H) 70 - 99 mg/dL    Comment: Glucose reference range applies only to samples taken after fasting for at least 8 hours.  Glucose, capillary     Status: Abnormal   Collection Time: 04/22/22  4:19 PM  Result Value Ref Range   Glucose-Capillary 121 (H) 70 - 99 mg/dL    Comment: Glucose reference range applies only to samples taken after  fasting for at least 8 hours.  Hemoglobin and hematocrit, blood     Status: Abnormal   Collection Time: 04/22/22 10:21 PM  Result Value Ref Range   Hemoglobin 7.0 (L) 13.0 - 17.0 g/dL   HCT 21.9 (L) 39.0 - 52.0 %    Comment: Performed at [facility], [address] [place], [place] [address]  Glucose, capillary     Status: Abnormal   Collection Time: 04/22/22 10:21 PM  Result Value Ref Range   Glucose-Capillary 175 (H) 70 - 99 mg/dL    Comment: Glucose reference range applies only to samples taken after fasting for at least 8 hours.  CBC with Differential/Platelet     Status: Abnormal   Collection Time: 04/23/22  4:13 AM  Result Value Ref Range   WBC 32.2 (H) 4.0 - 10.5 K/uL   RBC 3.58 (L) 4.22 - 5.81 MIL/uL   Hemoglobin 7.6 (L) 13.0 - 17.0 g/dL    Comment: Reticulocyte Hemoglobin testing may be clinically indicated, consider ordering this additional test [id number]    HCT 23.8 (L) 39.0 - 52.0 %   MCV 66.5 (L) 80.0 - 100.0 fL   MCH 21.2 (L) 26.0 - 34.0 pg   MCHC 31.9 30.0 - 36.0 g/dL   RDW 17.2 (H) 11.5 - 15.5 %   Platelets 316 150 - 400 K/uL   nRBC  0.0 0.0 - 0.2 %   Neutrophils Relative % 90 %   Neutro Abs 28.8 (H) 1.7 - 7.7 K/uL   Lymphocytes Relative 3 %   Lymphs Abs 0.8 0.7 - 4.0 K/uL   Monocytes Relative 5 %   Monocytes Absolute 1.6 (H) 0.1 - 1.0 K/uL   Eosinophils Relative 0 %   Eosinophils Absolute 0.1 0.0 - 0.5 K/uL   Basophils Relative 0 %   Basophils Absolute 0.1 0.0 - 0.1 K/uL   Immature Granulocytes 2 %   Abs Immature Granulocytes 0.74 (H) 0.00 - 0.07 K/uL    Comment: Performed at [facility], [address], [place], [place] [address]  Comprehensive metabolic panel     Status: Abnormal   Collection Time: 04/23/22  4:13 AM  Result Value Ref Range   Sodium 135 135 - 145 mmol/L   Potassium 3.7 3.5 - 5.1 mmol/L   Chloride 102 98 - 111 mmol/L   CO2 23 22 - 32 mmol/L   Glucose, Bld 167 (H) 70 - 99 mg/dL    Comment: Glucose reference range applies only to  samples taken after fasting for at least 8 hours.   BUN 21 8 - 23 mg/dL   Creatinine, Ser 1.60 (H) 0.61 - 1.24 mg/dL   Calcium 8.3 (L) 8.9 - 10.3 mg/dL   Total Protein 6.4 (L) 6.5 - 8.1 g/dL   Albumin 2.9 (L) 3.5 - 5.0 g/dL   AST 33 15 - 41 U/L   ALT 42 0 - 44 U/L   Alkaline Phosphatase 150 (H) 38 - 126 U/L   Total Bilirubin 1.2 0.3 - 1.2 mg/dL   GFR, Estimated 44 (L) >60 mL/min    Comment: (NOTE) Calculated using the CKD-EPI Creatinine Equation (2021)    Anion gap 10 5 - 15    Comment: Performed at [facility], [address], [place], [place] [address]  Magnesium     Status: Abnormal   Collection Time: 04/23/22  4:13 AM  Result Value Ref Range   Magnesium 1.4 (L) 1.7 - 2.4 mg/dL    Comment: Performed at [facility], [address] [place], [place] [address]  Glucose, capillary     Status: Abnormal   Collection Time: 04/23/22  7:35 AM  Result Value Ref Range   Glucose-Capillary 142 (H) 70 - 99 mg/dL    Comment: Glucose reference range applies only to samples taken after fasting for at least 8 hours.  Glucose, capillary     Status: Abnormal   Collection Time: 04/23/22 11:34 AM  Result Value Ref Range   Glucose-Capillary 162 (H) 70 - 99 mg/dL    Comment: Glucose reference range applies only to samples taken after fasting for at least 8 hours.  Glucose, capillary     Status: Abnormal   Collection Time: 04/23/22  4:11 PM  Result Value Ref Range   Glucose-Capillary 166 (H) 70 - 99 mg/dL    Comment: Glucose reference range applies only to samples taken after fasting for at least 8 hours.  Glucose, capillary     Status: Abnormal   Collection Time: 04/23/22  9:59 PM  Result Value Ref Range   Glucose-Capillary 158 (H) 70 - 99 mg/dL    Comment: Glucose reference range applies only to samples taken after fasting for at least 8 hours.   Comment 1 Notify RN    Comment 2 Document in Chart   CBC with Differential/Platelet     Status: Abnormal   Collection Time: 04/24/22  3:52 AM   Result Value Ref Range   WBC 22.4 (H) 4.0 - 10.5 K/uL   RBC 3.17 (L) 4.22 - 5.81 MIL/uL   Hemoglobin 6.8 (LL) 13.0 - 17.0 g/dL    Comment: Reticulocyte Hemoglobin testing may be clinically indicated, consider ordering this additional test [id number] THIS CRITICAL RESULT HAS VERIFIED AND BEEN CALLED TO ROBERT MAYNARD BY TRENA MCCLAIN STEPHENS ON 07 17 2023 AT 0536, AND HAS BEEN READ BACK.     HCT 20.7 (L) 39.0 - 52.0 %   MCV 65.3 (L) 80.0 - 100.0 fL   MCH 21.5 (L) 26.0 - 34.0 pg   MCHC 32.9 30.0 - 36.0 g/dL   RDW 17.2 (H) 11.5 - 15.5 %   Platelets 316 150 - 400 K/uL   nRBC 0.0 0.0 - 0.2 %   Neutrophils Relative % 87 %   Neutro Abs 19.5 (H) 1.7 - 7.7 K/uL   Lymphocytes Relative 4 %   Lymphs Abs 0.9 0.7 - 4.0 K/uL   Monocytes Relative 5 %   Monocytes Absolute 1.2 (H) 0.1 - 1.0 K/uL   Eosinophils Relative 2 %   Eosinophils Absolute 0.4 0.0 - 0.5 K/uL   Basophils Relative 0 %   Basophils Absolute 0.1 0.0 - 0.1 K/uL   Immature Granulocytes 2 %   Abs Immature Granulocytes 0.38 (H) 0.00 - 0.07 K/uL    Comment: Performed at [facility], [address], [place], [place] [address]  Basic metabolic panel     Status: Abnormal   Collection Time: 04/24/22  3:52 AM  Result Value Ref Range   Sodium 136 135 - 145 mmol/L   Potassium 3.7 3.5 - 5.1 mmol/L   Chloride 105 98 - 111 mmol/L   CO2 23 22 - 32 mmol/L   Glucose, Bld 160 (H) 70 - 99 mg/dL    Comment: Glucose reference range applies only to samples taken after fasting for at least 8 hours.   BUN 21 8 - 23 mg/dL   Creatinine, Ser 1.66 (H) 0.61 - 1.24 mg/dL   Calcium 8.1 (L) 8.9 - 10.3 mg/dL   GFR, Estimated 42 (L) >60 mL/min    Comment: (NOTE) Calculated using the CKD-EPI Creatinine Equation (2021)    Anion gap 8 5 - 15    Comment: Performed at [facility], [address], [place], [place] [address]  Magnesium     Status: None   Collection Time: 04/24/22  3:52 AM  Result Value Ref Range   Magnesium 1.8 1.7 - 2.4 mg/dL    Comment:  Performed at [facility], [address], [place], [place] [address]  Hemoglobin and hematocrit, blood     Status: Abnormal   Collection Time: 04/24/22  6:26 AM  Result Value Ref Range   Hemoglobin 7.1 (L) 13.0 - 17.0 g/dL   HCT 21.6 (L) 39.0 - 52.0 %    Comment: Performed at [facility], [address], [place], [place] [address]  Glucose, capillary     Status: Abnormal   Collection Time: 04/24/22  7:42 AM  Result Value Ref Range   Glucose-Capillary 131 (H) 70 - 99 mg/dL    Comment: Glucose reference range applies only to samples taken after fasting for at least 8 hours.  Glucose, capillary     Status: Abnormal   Collection Time: 04/24/22 11:15 AM  Result Value Ref Range   Glucose-Capillary 169 (H) 70 - 99 mg/dL    Comment: Glucose reference range applies only to samples taken after  fasting for at least 8 hours.   Recent Results (from the past 240 hour(s))  Culture, blood (Routine x 2)     Status: None (Preliminary result)   Collection Time: 04/21/22 11:17 AM   Specimen: BLOOD RIGHT ARM  Result Value Ref Range Status   Specimen Description   Final    BLOOD RIGHT ARM BOTTLES DRAWN AEROBIC AND ANAEROBIC   Special Requests   Final    Blood Culture results may not be optimal due to an excessive volume of blood received in culture bottles   Culture   Final    NO GROWTH 3 DAYS Performed at [facility], [address], [place], [place] [address]    Report Status PENDING  Incomplete  Culture, blood (Routine x 2)     Status: None (Preliminary result)   Collection Time: 04/21/22 11:17 AM   Specimen: BLOOD LEFT ARM  Result Value Ref Range Status   Specimen Description BLOOD LEFT ARM BOTTLES DRAWN AEROBIC AND ANAEROBIC  Final   Special Requests Blood Culture adequate volume  Final   Culture   Final    NO GROWTH 3 DAYS Performed at [facility], [address] [place], [place] [address]    Report Status PENDING  Incomplete  Urine Culture     Status: None   Collection Time: 04/21/22  11:49 AM   Specimen: Urine, Clean Catch  Result Value Ref Range Status   Specimen Description   Final    URINE, CLEAN CATCH Performed at [facility], [address], [place], [place] [address]    Special Requests   Final    NONE Performed at [facility], [address], [place], [place] [address]    Culture   Final    NO GROWTH Performed at [facility], [place] [address] [place], [place] [address]    Report Status 04/23/2022 FINAL  Final  MRSA Next Gen by PCR, Nasal     Status: None   Collection Time: 04/21/22  3:39 PM   Specimen: Nasal Mucosa; Nasal Swab  Result Value Ref Range Status   MRSA by PCR Next Gen NOT DETECTED NOT DETECTED Final    Comment: (NOTE) The GeneXpert MRSA Assay (FDA approved for NASAL specimens only), is one component of a comprehensive MRSA colonization surveillance program. It is not intended to diagnose MRSA infection nor to guide or monitor treatment for MRSA infections. Test performance is not FDA approved in patients less than 81 years old. 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 I discussed ureteral stent placement versus observation and after discussing the options the patient wishes to pursue observation because he had significant stent colic after his last procedure with Dr. Lovena Neighbours. We will order dilaudid PO tabs every 3-4 hours. If this fails to control his pain we will then pursue left ureteral stent placement  [name] 04/24/2022, 3:32 PM

## 2022-04-24 NOTE — Assessment & Plan Note (Addendum)
7/17 developed hypoxia and tachypnea - resolved now -weaned to room air -personally reviewed 7/17 CXR--pulm edema, vasc congestion -lasix IV 40 mg x 1 on 7/17 -Echo: LVEF 60-65%

## 2022-04-24 NOTE — TOC Progression Note (Signed)
  Transition of Care [facility]) Screening Note   Patient Details  Name: Joshua Osborne Date of Birth: 1940-12-20   Transition of Care [facility]) CM/SW Contact:    Boneta Lucks, RN Phone Number: 04/24/2022, 3:02 PM    Transition of Care Department [facility] has reviewed patient and no TOC needs have been identified at this time. We will continue to monitor patient advancement through interdisciplinary progression rounds. If new patient transition needs arise, please place a TOC consult.      Barriers to Discharge: Continued Medical Work up

## 2022-04-24 NOTE — Progress Notes (Signed)
Pt shivering with O2 sat. 85% on RA. Placed pt on [place]. Temp is 103.1 orally. BP 184/67. Notified Dr. Carles Collet. Will get blood cultures and give Tylenol '650mg'$  as ordered.

## 2022-04-25 ENCOUNTER — Inpatient Hospital Stay (HOSPITAL_COMMUNITY): Payer: MEDICARE

## 2022-04-25 DIAGNOSIS — N179 Acute kidney failure, unspecified: Secondary | ICD-10-CM | POA: Diagnosis not present

## 2022-04-25 DIAGNOSIS — J9601 Acute respiratory failure with hypoxia: Secondary | ICD-10-CM | POA: Diagnosis not present

## 2022-04-25 DIAGNOSIS — G893 Neoplasm related pain (acute) (chronic): Secondary | ICD-10-CM | POA: Diagnosis not present

## 2022-04-25 DIAGNOSIS — A419 Sepsis, unspecified organism: Secondary | ICD-10-CM | POA: Diagnosis not present

## 2022-04-25 DIAGNOSIS — N10 Acute tubulo-interstitial nephritis: Secondary | ICD-10-CM | POA: Diagnosis not present

## 2022-04-25 LAB — TYPE AND SCREEN
ABO/RH(D): A POS
Antibody Screen: NEGATIVE
Unit division: 0
Unit division: 0

## 2022-04-25 LAB — ECHOCARDIOGRAM COMPLETE
AR max vel: 2.36 cm2
AV Area VTI: 2.21 cm2
AV Area mean vel: 2.13 cm2
AV Mean grad: 3 mmHg
AV Peak grad: 5.7 mmHg
Ao pk vel: 1.19 m/s
Area-P 1/2: 4.26 cm2
Calc EF: 59.3 %
Height: 72 in
MV VTI: 1.66 cm2
S' Lateral: 3.2 cm
Single Plane A2C EF: 59.9 %
Single Plane A4C EF: 57.6 %
Weight: 2917.13 oz

## 2022-04-25 LAB — COMPREHENSIVE METABOLIC PANEL
ALT: 86 U/L — ABNORMAL HIGH (ref 0–44)
ALT: 80 U/L — ABNORMAL HIGH (ref 0–44)
AST: 67 U/L — ABNORMAL HIGH (ref 15–41)
AST: 57 U/L — ABNORMAL HIGH (ref 15–41)
Albumin: 2.9 g/dL — ABNORMAL LOW (ref 3.5–5.0)
Albumin: 2.9 g/dL — ABNORMAL LOW (ref 3.5–5.0)
Alkaline Phosphatase: 253 U/L — ABNORMAL HIGH (ref 38–126)
Alkaline Phosphatase: 246 U/L — ABNORMAL HIGH (ref 38–126)
Anion gap: 11 (ref 5–15)
Anion gap: 10 (ref 5–15)
BUN: 21 mg/dL (ref 8–23)
BUN: 19 mg/dL (ref 8–23)
CO2: 25 mmol/L (ref 22–32)
CO2: 27 mmol/L (ref 22–32)
Calcium: 8.4 mg/dL — ABNORMAL LOW (ref 8.9–10.3)
Calcium: 8.5 mg/dL — ABNORMAL LOW (ref 8.9–10.3)
Chloride: 99 mmol/L (ref 98–111)
Chloride: 98 mmol/L (ref 98–111)
Creatinine, Ser: 1.72 mg/dL — ABNORMAL HIGH (ref 0.61–1.24)
Creatinine, Ser: 1.61 mg/dL — ABNORMAL HIGH (ref 0.61–1.24)
GFR, Estimated: 40 mL/min — ABNORMAL LOW (ref >60)
GFR, Estimated: 43 mL/min — ABNORMAL LOW (ref >60)
Glucose, Bld: 148 mg/dL — ABNORMAL HIGH (ref 70–99)
Glucose, Bld: 166 mg/dL — ABNORMAL HIGH (ref 70–99)
Potassium: 4.3 mmol/L (ref 3.5–5.1)
Potassium: 4.2 mmol/L (ref 3.5–5.1)
Sodium: 135 mmol/L (ref 135–145)
Sodium: 135 mmol/L (ref 135–145)
Total Bilirubin: 1.7 mg/dL — ABNORMAL HIGH (ref 0.3–1.2)
Total Bilirubin: 1.3 mg/dL — ABNORMAL HIGH (ref 0.3–1.2)
Total Protein: 6.8 g/dL (ref 6.5–8.1)
Total Protein: 7 g/dL (ref 6.5–8.1)

## 2022-04-25 LAB — CBC
HCT: 28.1 % — ABNORMAL LOW (ref 39.0–52.0)
Hemoglobin: 9.1 g/dL — ABNORMAL LOW (ref 13.0–17.0)
MCH: 22 pg — ABNORMAL LOW (ref 26.0–34.0)
MCHC: 32.4 g/dL (ref 30.0–36.0)
MCV: 67.9 fL — ABNORMAL LOW (ref 80.0–100.0)
Platelets: 385 10*3/uL (ref 150–400)
RBC: 4.14 MIL/uL — ABNORMAL LOW (ref 4.22–5.81)
RDW: 21.1 % — ABNORMAL HIGH (ref 11.5–15.5)
WBC: 21 10*3/uL — ABNORMAL HIGH (ref 4.0–10.5)
nRBC: 0 % (ref 0.0–0.2)

## 2022-04-25 LAB — BPAM RBC
Blood Product Expiration Date: 202308022359
Blood Product Expiration Date: 202308022359
ISSUE DATE / TIME: 202307151548
ISSUE DATE / TIME: 202307171148
Unit Type and Rh: 6200
Unit Type and Rh: 6200

## 2022-04-25 LAB — BRAIN NATRIURETIC PEPTIDE: B Natriuretic Peptide: 325 pg/mL — ABNORMAL HIGH (ref 0.0–100.0)

## 2022-04-25 LAB — HEMOGLOBIN AND HEMATOCRIT, BLOOD
HCT: 28.3 % — ABNORMAL LOW (ref 39.0–52.0)
Hemoglobin: 9.4 g/dL — ABNORMAL LOW (ref 13.0–17.0)

## 2022-04-25 LAB — GLUCOSE, CAPILLARY
Glucose-Capillary: 125 mg/dL — ABNORMAL HIGH (ref 70–99)
Glucose-Capillary: 146 mg/dL — ABNORMAL HIGH (ref 70–99)
Glucose-Capillary: 150 mg/dL — ABNORMAL HIGH (ref 70–99)

## 2022-04-25 LAB — MAGNESIUM: Magnesium: 1.7 mg/dL (ref 1.7–2.4)

## 2022-04-25 LAB — PROCALCITONIN: Procalcitonin: 2.42 ng/mL

## 2022-04-25 MED ORDER — MAGNESIUM SULFATE 2 GM/50ML IV SOLN
2.0000 g | Freq: Once | INTRAVENOUS | Status: AC
Start: 2022-04-25 — End: 2022-04-25
  Administered 2022-04-25: 2 g via INTRAVENOUS
  Filled 2022-04-25: qty 50

## 2022-04-25 NOTE — Progress Notes (Signed)
*  PRELIMINARY RESULTS* Echocardiogram 2D Echocardiogram has been performed.  Joshua Osborne 04/25/2022, 2:23 PM

## 2022-04-25 NOTE — Progress Notes (Signed)
PROGRESS NOTE  Joshua Osborne FUX:[id number] DOB: Sep 23, 1941 DOA: 04/21/2022 PCP: Earney Mallet, MD  Brief History:  81 year old male with a history of depression, diabetes mellitus, urothelial carcinoma, hyperlipidemia presenting with 2-week history of generalized weakness.  In the past couple days, the patient has had fevers and chills.  He actually had shivering and rigors on the morning of 04/21/2022.  He has had poor oral intake.  His wife took his temperature on the morning of 04/21/2022 and it was 100.0 F.  He went to see his urologist, Dr. Lovena Neighbours, on 04/20/2022.  He was diagnosed with a UTI and started on cephalexin.  He took 2 doses prior to coming to the hospital.  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  He is scheduled tentatively for a laparoscopic left-sided nephroureterectomy on 05/30/2022.  He had 2 episodes of nausea and vomiting prior to coming to the hospital on the morning of admission.  He denies any abdominal pain but complains of left-sided flank pain which has worsened over the last couple days prior to admission.  He has been taking oxycodone 5 mg approximately twice daily since his renal biopsy.  He denies any headache, neck pain, chest pain, shortness of breath, cough, hemoptysis.  There is no diarrhea, hematochezia, melena, abdominal pain.  He has some intermittent dysuria. In the ED, the patient was febrile up to 101.7 F.  He was tachycardic into the 140s.  He was hemodynamically stable.  WBC 34.4, hemoglobin 8.4, platelets 266,000.  Sodium 133, potassium 4.5, bicarbonate 23, serum creatinine 1.76.  AST 32, ALT 47, ALP 157, total bilirubin 1.3.  UA shows greater than 50 WBC, 21-50 RBC.  Lactic acid 1.2.  CT of the abdomen and pelvis showed increased left hydronephrosis with hydroureter secondary to persistent soft tissue within the left renal collecting system consistent with tumor extending to the UPJ.   There is nonobstructive left renal calculi.  The patient was given the appropriate fluid boluses and started on ceftriaxone.  He was admitted for further evaluation and treatment.    Assessment and Plan: * Septic shock ([place]) Presented with fever, tachycardia, leukocytosis Required low dose levophed overnight 7/14-7/15>>weaned off Lactate peaked 1.2 UA>50 WBC Follow blood and urine cultures--negative to date but pt was on cephalexin prior to admission CXR--no infiltrates Antibiotics broaden to zosyn WBC up to 41.6>>32.2>>22.4>>21.0 Continue IVF>>saline lock on 7/17 due to fluid overload -still continues to have fever up to 103.1 on 7/17  Acute pyelonephritis WBC up 41.6,>>32.2>22.4>>21.0 --fever curve is improving>>spiked fever 7/17 around 1745 --pt clinically stable --broaden abx pending culture data>>zosyn started 7/15 --urine culture is neg but pt was on cephalexin prior to admission -04/24/22--repeat blood and urine culture--respiked fever 103.1 -urology consulted>>discussed with Dr. Alyson Ingles  AKI (acute kidney injury) [facility]) Multifactorial including sepsis/hypotension, obstruction, hypovolemia -baseline creatiint 0.9-1.0 -serum creatinine peaked 1.76>>1.60>>1.66>>1.72 -overall slowly improving -continue IVF>>saline lock as pt is not fluid overloaed -urology consulted>>discussed with Dr. Gregor Hams for possible stent 7/19  Transitional cell carcinoma of renal pelvis, left ([place]) -PET scan (03/16/2022): Soft tissue mass within the left renal pelvis, SUV 6.7.  No hypermetabolic lymph nodes.  No liver lesions hypermetabolic. -He underwent ureteroscopy and biopsy on 03/27/2022. 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  Muscularis propria not identified in the current specimen --laparoscopic left nephroureterectomy tentatively planned 05/30/22 --appreciate Dr. Alyson Ingles consult  Acute respiratory failure with hypoxia ([place]) 7/17 developed hypoxia and  tachypnea -stable on 2-3L -due to pulm edema -personally reviewed 7/17 CXR--pulm edema, vasc congestion -saline lock IVF -lasix IV 40 mg x 1 on 7/17 -Echo  Hypomagnesemia replete  Cancer related pain Left flank pain not well controlled D/c oxycodone Start IV dilaudid>>increase to 1 mg q 3 hrs prn Changed back to po dilaudid after discussion with Dr. 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  No hypermetabolic lymph nodes.  No liver lesions hypermetabolic. -He underwent ureteroscopy and biopsy on 03/27/2022. 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  Muscularis propria not identified in the current specimen --laparoscopic left nephroureterectomy and TURBT tentatively planned 05/30/22  Symptomatic anemia Hgb dropped to 6.9 Likely dilution Transfused one unit PRBC 7/15 -transfused one unit PRBC 7/17      Family Communication:   Family updated at bedside 7/18  Consultants:  urology  Code Status:  FULL   DVT Prophylaxis:  [place] Lovenox   Procedures: As Listed in Progress Note Above  Antibiotics: Ceftriaxone 7/14>>7/15 Zosyn 7/15>>    Subjective: Patient continues to have left-sided flank pain.  He states that the oral Dilaudid does not work as fast but lasts longer.  He denies any headache, chest pain, shortness breath, cough, hemoptysis, nausea, vomiting, diarrhea, abdominal pain.  There is no diarrhea, hematochezia, melena.  Objective: Vitals:   04/25/22 0400 04/25/22 0534 04/25/22 0743 04/25/22 0900  BP: (!) 161/64   (!) 129/54  Pulse: 86   79  Resp: 19   12  Temp:   (!) 101.5 F (38.6 C)   TempSrc:   Oral   SpO2: 100%   98%  Weight:   82.7 kg    Height:        Intake/Output Summary (Last 24 hours) at 04/25/2022 1347 Last data filed at 04/25/2022 0953 Gross per 24 hour  Intake 1073.33 ml  Output 2875 ml  Net -1801.67 ml   Weight change: 0.5 kg Exam:  General:  Pt is alert, follows commands appropriately, not in acute distress HEENT: No icterus, No thrush, No neck mass, [place]/AT Cardiovascular: RRR, S1/S2, no rubs, no gallops Respiratory: CTA bilaterally, no wheezing, no crackles, no rhonchi Abdomen: Soft/+BS, non tender, non distended, no guarding; left costovertebral tenderness Extremities: No edema, No lymphangitis, No petechiae, No rashes, no synovitis   Data Reviewed: I have personally reviewed following labs and imaging studies Basic Metabolic Panel: Recent Labs  Lab 04/21/22 1114 04/22/22 0350 04/23/22 0413 04/24/22 0352 04/25/22 0408  NA 133* 136 135 136 135  K 4.5 4.3 3.7 3.7 4.3  CL 100 105 102 105 99  CO2 23 21* '23 23 25  '$ GLUCOSE 183* 163* 167* 160* 148*  BUN 32* 24* '21 21 21  '$ CREATININE 1.76* 1.65* 1.60* 1.66* 1.72*  CALCIUM 9.0 8.0* 8.3* 8.1* 8.4*  MG  --   --  1.4* 1.8 1.7   Liver Function Tests: Recent Labs  Lab 04/21/22 1114 04/22/22 0350 04/23/22 0413 04/25/22 0408  AST 32 30 33 67*  ALT 47* 39 42 86*  ALKPHOS 157* 134* 150* 253*  BILITOT 1.3* 1.1 1.2 1.7*  PROT 7.7 6.4* 6.4* 6.8  ALBUMIN 3.7 3.0* 2.9* 2.9*   No results for input(s): "LIPASE", "AMYLASE" in the last 168 hours. No results for input(s): "  AMMONIA" in the last 168 hours. Coagulation Profile: Recent Labs  Lab 04/21/22 1114  INR 1.2   CBC: Recent Labs  Lab 04/21/22 1114 04/22/22 0954 04/22/22 2221 04/23/22 0413 04/24/22 0352 04/24/22 0626 04/24/22 1642 04/25/22 0408  WBC 34.4* 41.6*  --  32.2* 22.4*  --   --  21.0*  NEUTROABS 31.1*  --   --  28.8* 19.5*  --   --   --   HGB 8.4* 6.9*   < > 7.6* 6.8* 7.1* 9.4* 9.1*  HCT 26.2* 21.7*   < > 23.8* 20.7* 21.6* 28.3* 28.1*  MCV 64.2* 64.4*  --  66.5* 65.3*   --   --  67.9*  PLT 366 329  --  316 316  --   --  385   < > = values in this interval not displayed.   Cardiac Enzymes: No results for input(s): "CKTOTAL", "CKMB", "CKMBINDEX", "TROPONINI" in the last 168 hours. BNP: Invalid input(s): "POCBNP" CBG: Recent Labs  Lab 04/24/22 0742 04/24/22 1115 04/24/22 1624 04/24/22 1946 04/25/22 1140  GLUCAP 131* 169* 132* 134* 125*   HbA1C: No results for input(s): "HGBA1C" in the last 72 hours. Urine analysis:    Component Value Date/Time   COLORURINE STRAW (A) 04/24/2022 1935   APPEARANCEUR CLEAR 04/24/2022 1935   LABSPEC 1.006 04/24/2022 1935   PHURINE 6.0 04/24/2022 1935   GLUCOSEU NEGATIVE 04/24/2022 1935   HGBUR SMALL (A) 04/24/2022 [place] NEGATIVE 04/24/2022 [place] NEGATIVE 04/24/2022 1935   PROTEINUR NEGATIVE 04/24/2022 1935   NITRITE NEGATIVE 04/24/2022 1935   LEUKOCYTESUR NEGATIVE 04/24/2022 1935   Sepsis Labs: '@LABRCNTIP'$ (procalcitonin:4,lacticidven:4) ) Recent Results (from the past 240 hour(s))  Culture, blood (Routine x 2)     Status: None (Preliminary result)   Collection Time: 04/21/22 11:17 AM   Specimen: BLOOD RIGHT ARM  Result Value Ref Range Status   Specimen Description   Final    BLOOD RIGHT ARM BOTTLES DRAWN AEROBIC AND ANAEROBIC   Special Requests   Final    Blood Culture results may not be optimal due to an excessive volume of blood received in culture bottles   Culture   Final    NO GROWTH 4 DAYS Performed at [facility], [address] [place], [place] [address]    Report Status PENDING  Incomplete  Culture, blood (Routine x 2)     Status: None (Preliminary result)   Collection Time: 04/21/22 11:17 AM   Specimen: BLOOD LEFT ARM  Result Value Ref Range Status   Specimen Description BLOOD LEFT ARM BOTTLES DRAWN AEROBIC AND ANAEROBIC  Final   Special Requests Blood Culture adequate volume  Final   Culture   Final    NO GROWTH 4 DAYS Performed at [facility], [address], [place], [place] [address]    Report Status PENDING  Incomplete  Urine Culture     Status: None   Collection Time: 04/21/22 11:49 AM   Specimen: Urine, Clean Catch  Result Value Ref Range Status   Specimen Description   Final    URINE, CLEAN CATCH Performed at [facility], [address], [place], [place] [address]    Special Requests   Final    NONE Performed at [facility], [address], [place], [place] [address]    Culture   Final    NO GROWTH Performed at [facility], [place] [address], [place], [place] [address]    Report Status 04/23/2022 FINAL  Final  MRSA Next Gen by PCR, Nasal     Status: None   Collection Time: 04/21/22  3:39 PM   Specimen: Nasal Mucosa; Nasal Swab  Result Value Ref Range Status   MRSA by PCR Next Gen NOT DETECTED NOT DETECTED Final    Comment: (NOTE) The GeneXpert MRSA Assay (FDA approved for NASAL specimens only), is one component of a comprehensive MRSA colonization surveillance program. It is not intended to diagnose MRSA infection nor to guide or monitor treatment for MRSA infections. Test performance is not FDA approved in patients less than 33 years old. Performed at [facility], [address], [place], [place] [address]   Culture, blood (Routine X 2) w Reflex to ID Panel     Status: None (Preliminary result)   Collection Time: 04/24/22  6:38 PM   Specimen: Right Antecubital; Blood  Result Value Ref Range Status   Specimen Description RIGHT ANTECUBITAL  Final   Special Requests   Final    BOTTLES DRAWN AEROBIC AND ANAEROBIC Blood Culture adequate volume   Culture   Final    NO GROWTH < 12 HOURS Performed at [facility], [address] [place], [place] [address]    Report Status PENDING  Incomplete  Culture, blood (Routine X 2) w Reflex to ID Panel     Status: None (Preliminary result)   Collection Time: 04/24/22  6:38 PM   Specimen: Left Antecubital; Blood  Result Value Ref Range Status   Specimen Description LEFT ANTECUBITAL   Final   Special Requests   Final    BOTTLES DRAWN AEROBIC AND ANAEROBIC Blood Culture adequate volume   Culture   Final    NO GROWTH < 12 HOURS Performed at [facility], [address], [place], [place] [address]    Report Status PENDING  Incomplete     Scheduled Meds:  Chlorhexidine Gluconate Cloth  6 each Topical Daily   citalopram  10 mg Oral Daily   enoxaparin (LOVENOX) injection  40 mg Subcutaneous Q24H   feeding supplement  237 mL Oral TID BM   insulin aspart  0-9 Units Subcutaneous TID WC   latanoprost  1 drop Both Eyes QHS   multivitamin with minerals  1 tablet Oral Daily   niacin  500 mg Oral QHS   pantoprazole  40 mg Oral Daily   tamsulosin  0.4 mg Oral Daily   tiZANidine  2 mg Oral QHS   Continuous Infusions:  sodium chloride     piperacillin-tazobactam (ZOSYN)  IV 3.375 g (04/25/22 0532)    Procedures/Studies: DG CHEST PORT 1 VIEW  Result Date: 04/24/2022 CLINICAL DATA:  Fever, chills and shortness of breath EXAM: PORTABLE CHEST 1 VIEW COMPARISON:  04/21/2022 FINDINGS: Heart size remains normal. Mediastinal shadows are unremarkable except for mild atherosclerosis of the aorta. There is new abnormal bilateral pulmonary density in a pattern most consistent with pulmonary venous hypertension/fluid overload. Widespread developing pneumonia could have a similar appearance. IMPRESSION: Suspicion of congestive heart failure with developing edema. Developing widespread pneumonia could have a similar appearance, but is less likely. Electronically Signed   By: Nelson Chimes M.D.   On: 04/24/2022 18:53   CT ABDOMEN PELVIS W CONTRAST  Result Date: 04/21/2022 CLINICAL DATA:  LEFT flank pain and LEFT lower quadrant pain, fever, and chills for the past few days, suspected kidney stone; history of renal cell carcinoma, diverticulitis, pyelonephritis EXAM: CT ABDOMEN AND PELVIS WITH CONTRAST TECHNIQUE: Multidetector CT imaging of the abdomen and pelvis was performed  using the standard  protocol following bolus administration of intravenous contrast. RADIATION DOSE REDUCTION: This exam was performed according to the departmental dose-optimization program which includes automated exposure control, adjustment of the mA and/or kV according to patient size and/or use of iterative reconstruction technique. CONTRAST:  85m OMNIPAQUE IOHEXOL 300 MG/ML SOLN IV. No oral contrast. COMPARISON:  03/07/2022 Correlation: PET-CT 03/16/2022 FINDINGS: Lower chest: Lung bases clear Hepatobiliary: Calcifications with surrounding low attenuation in the RIGHT lobe of the liver question treated metastases. Post cholecystectomy. No new hepatic abnormalities. Pancreas: Atrophic pancreas without mass Spleen: Normal appearance Adrenals/Urinary Tract: Adrenal glands and RIGHT kidney normal appearance. Again identified soft tissue within LEFT renal collecting system consistent with known tumor, extending to the reader 0 pelvic junction and causing increased LEFT hydronephrosis. Nonobstructing LEFT renal calculi. LEFT ureter is dilated in its course to the urinary bladder without calcification or mass. RIGHT ureter decompressed. Bladder unremarkable. Stomach/Bowel: Normal appendix. Diverticulosis of descending and sigmoid colon without evidence of diverticulitis. Stomach and remaining bowel loops unremarkable. Vascular/Lymphatic: Atherosclerotic calcifications aorta and iliac arteries without aneurysm. Vascular structures patent. No adenopathy. Reproductive: Significant prostatic enlargement, gland measuring 7.2 x 6.0 x 5.7 cm (volume = 130 cm^3). Seminal vesicles unremarkable. Other: No free air or free fluid. No hernia or inflammatory process. 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 Significant  prostatic enlargement. Distal colonic diverticulosis without evidence of diverticulitis. 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 Aortic Atherosclerosis (ICD10-I70.0). Electronically Signed   By: MLavonia DanaM.D.   On: 04/21/2022 12:49   DG Chest 2 View  Result Date: 04/21/2022 CLINICAL DATA:  Suspected sepsis EXAM: CHEST - 2 VIEW COMPARISON:  None Available. FINDINGS: The heart size and mediastinal contours are within normal limits. Both lungs are clear. The visualized skeletal structures are unremarkable. IMPRESSION: No active cardiopulmonary disease. Electronically Signed   By: DDorise BullionIII M.D.   On: 04/21/2022 11:30    DOrson Eva DO  [facility]  If 7PM-7AM, please contact night-coverage [email or web address] Password [email or web address] 04/25/2022, 1:47 PM   LOS: 4 days

## 2022-04-26 ENCOUNTER — Encounter (HOSPITAL_COMMUNITY): Payer: Self-pay | Admitting: Internal Medicine

## 2022-04-26 ENCOUNTER — Encounter (HOSPITAL_COMMUNITY)
Admission: EM | Disposition: A | Discharge status: Home / Self Care | Payer: Self-pay | Source: Home / Self Care | Attending: Internal Medicine

## 2022-04-26 ENCOUNTER — Ambulatory Visit (HOSPITAL_COMMUNITY): Payer: MEDICARE | Admitting: Hematology

## 2022-04-26 ENCOUNTER — Inpatient Hospital Stay (HOSPITAL_COMMUNITY): Payer: MEDICARE | Admitting: Anesthesiology

## 2022-04-26 ENCOUNTER — Inpatient Hospital Stay (HOSPITAL_COMMUNITY): Payer: MEDICARE

## 2022-04-26 DIAGNOSIS — C672 Malignant neoplasm of lateral wall of bladder: Secondary | ICD-10-CM

## 2022-04-26 DIAGNOSIS — A419 Sepsis, unspecified organism: Secondary | ICD-10-CM | POA: Diagnosis not present

## 2022-04-26 DIAGNOSIS — N1330 Unspecified hydronephrosis: Secondary | ICD-10-CM

## 2022-04-26 DIAGNOSIS — D630 Anemia in neoplastic disease: Secondary | ICD-10-CM

## 2022-04-26 DIAGNOSIS — N179 Acute kidney failure, unspecified: Secondary | ICD-10-CM | POA: Diagnosis not present

## 2022-04-26 DIAGNOSIS — C679 Malignant neoplasm of bladder, unspecified: Secondary | ICD-10-CM

## 2022-04-26 DIAGNOSIS — E119 Type 2 diabetes mellitus without complications: Secondary | ICD-10-CM

## 2022-04-26 DIAGNOSIS — N10 Acute tubulo-interstitial nephritis: Secondary | ICD-10-CM | POA: Diagnosis not present

## 2022-04-26 DIAGNOSIS — R6521 Severe sepsis with septic shock: Secondary | ICD-10-CM | POA: Diagnosis not present

## 2022-04-26 DIAGNOSIS — Z7984 Long term (current) use of oral hypoglycemic drugs: Secondary | ICD-10-CM

## 2022-04-26 HISTORY — PX: CYSTOSCOPY WITH RETROGRADE PYELOGRAM, URETEROSCOPY AND STENT PLACEMENT: SHX5789

## 2022-04-26 HISTORY — PX: TRANSURETHRAL RESECTION OF BLADDER TUMOR: SHX2575

## 2022-04-26 LAB — CBC
HCT: 26.3 % — ABNORMAL LOW (ref 39.0–52.0)
Hemoglobin: 8.6 g/dL — ABNORMAL LOW (ref 13.0–17.0)
MCH: 22.1 pg — ABNORMAL LOW (ref 26.0–34.0)
MCHC: 32.7 g/dL (ref 30.0–36.0)
MCV: 67.4 fL — ABNORMAL LOW (ref 80.0–100.0)
Platelets: 424 10*3/uL — ABNORMAL HIGH (ref 150–400)
RBC: 3.9 MIL/uL — ABNORMAL LOW (ref 4.22–5.81)
RDW: 21.2 % — ABNORMAL HIGH (ref 11.5–15.5)
WBC: 20.6 10*3/uL — ABNORMAL HIGH (ref 4.0–10.5)
nRBC: 0 % (ref 0.0–0.2)

## 2022-04-26 LAB — GLUCOSE, CAPILLARY
Glucose-Capillary: 150 mg/dL — ABNORMAL HIGH (ref 70–99)
Glucose-Capillary: 142 mg/dL — ABNORMAL HIGH (ref 70–99)
Glucose-Capillary: 203 mg/dL — ABNORMAL HIGH (ref 70–99)
Glucose-Capillary: 244 mg/dL — ABNORMAL HIGH (ref 70–99)
Glucose-Capillary: 234 mg/dL — ABNORMAL HIGH (ref 70–99)

## 2022-04-26 LAB — URINE CULTURE: Culture: NO GROWTH

## 2022-04-26 LAB — MAGNESIUM: Magnesium: 2 mg/dL (ref 1.7–2.4)

## 2022-04-26 SURGERY — CYSTOURETEROSCOPY, WITH RETROGRADE PYELOGRAM AND STENT INSERTION
Anesthesia: General | Site: Ureter | Laterality: Left

## 2022-04-26 MED ORDER — EPHEDRINE 5 MG/ML INJ
INTRAVENOUS | Status: AC
  Filled 2022-04-26: qty 5

## 2022-04-26 MED ORDER — CHLORHEXIDINE GLUCONATE 0.12 % MT SOLN
15.0000 mL | Freq: Once | OROMUCOSAL | Status: AC
  Administered 2022-04-26: 15 mL via OROMUCOSAL

## 2022-04-26 MED ORDER — PROPOFOL 10 MG/ML IV BOLUS
INTRAVENOUS | Status: AC
  Filled 2022-04-26: qty 20

## 2022-04-26 MED ORDER — SEVOFLURANE IN SOLN
RESPIRATORY_TRACT | Status: AC
  Filled 2022-04-26: qty 250

## 2022-04-26 MED ORDER — SODIUM CHLORIDE 0.9 % IR SOLN
Status: DC | PRN
  Administered 2022-04-26: 3000 mL

## 2022-04-26 MED ORDER — FENTANYL CITRATE (PF) 100 MCG/2ML IJ SOLN
INTRAMUSCULAR | Status: DC | PRN
  Administered 2022-04-26 (×2): 50 ug via INTRAVENOUS

## 2022-04-26 MED ORDER — DEXAMETHASONE SODIUM PHOSPHATE 10 MG/ML IJ SOLN
INTRAMUSCULAR | Status: AC
  Filled 2022-04-26: qty 1

## 2022-04-26 MED ORDER — ORAL CARE MOUTH RINSE: 15.0000 mL | Freq: Once | OROMUCOSAL | Status: AC

## 2022-04-26 MED ORDER — LACTATED RINGERS IV SOLN: INTRAVENOUS | Status: DC

## 2022-04-26 MED ORDER — HYDROMORPHONE HCL 1 MG/ML IJ SOLN: 0.2500 mg | INTRAMUSCULAR | Status: DC | PRN

## 2022-04-26 MED ORDER — LIDOCAINE HCL (PF) 2 % IJ SOLN
INTRAMUSCULAR | Status: AC
  Filled 2022-04-26: qty 5

## 2022-04-26 MED ORDER — LIDOCAINE 2% (20 MG/ML) 5 ML SYRINGE
INTRAMUSCULAR | Status: DC | PRN
  Administered 2022-04-26: 80 mg via INTRAVENOUS

## 2022-04-26 MED ORDER — ONDANSETRON HCL 4 MG/2ML IJ SOLN
INTRAMUSCULAR | Status: DC | PRN
  Administered 2022-04-26: 4 mg via INTRAVENOUS

## 2022-04-26 MED ORDER — SUCCINYLCHOLINE CHLORIDE 200 MG/10ML IV SOSY
PREFILLED_SYRINGE | INTRAVENOUS | Status: AC
  Filled 2022-04-26: qty 10

## 2022-04-26 MED ORDER — GLYCOPYRROLATE PF 0.2 MG/ML IJ SOSY
PREFILLED_SYRINGE | INTRAMUSCULAR | Status: DC | PRN
  Administered 2022-04-26 (×2): .1 mg via INTRAVENOUS

## 2022-04-26 MED ORDER — WATER FOR IRRIGATION, STERILE IR SOLN
Status: DC | PRN
  Administered 2022-04-26: 500 mL

## 2022-04-26 MED ORDER — PROPOFOL 500 MG/50ML IV EMUL
INTRAVENOUS | Status: AC
  Filled 2022-04-26: qty 50

## 2022-04-26 MED ORDER — PHENYLEPHRINE 80 MCG/ML (10ML) SYRINGE FOR IV PUSH (FOR BLOOD PRESSURE SUPPORT)
PREFILLED_SYRINGE | INTRAVENOUS | Status: DC | PRN
  Administered 2022-04-26 (×5): 80 ug via INTRAVENOUS

## 2022-04-26 MED ORDER — ROCURONIUM BROMIDE 10 MG/ML (PF) SYRINGE
PREFILLED_SYRINGE | INTRAVENOUS | Status: AC
  Filled 2022-04-26: qty 10

## 2022-04-26 MED ORDER — ONDANSETRON HCL 4 MG/2ML IJ SOLN
INTRAMUSCULAR | Status: AC
  Filled 2022-04-26: qty 2

## 2022-04-26 MED ORDER — DEXAMETHASONE SODIUM PHOSPHATE 4 MG/ML IJ SOLN
INTRAMUSCULAR | Status: DC | PRN
  Administered 2022-04-26: 5 mg via INTRAVENOUS

## 2022-04-26 MED ORDER — DIATRIZOATE MEGLUMINE 30 % UR SOLN
URETHRAL | Status: DC | PRN
  Administered 2022-04-26: 100 mL via URETHRAL

## 2022-04-26 MED ORDER — DIATRIZOATE MEGLUMINE 30 % UR SOLN
URETHRAL | Status: AC
  Filled 2022-04-26: qty 100

## 2022-04-26 MED ORDER — PHENYLEPHRINE 80 MCG/ML (10ML) SYRINGE FOR IV PUSH (FOR BLOOD PRESSURE SUPPORT)
PREFILLED_SYRINGE | INTRAVENOUS | Status: AC
  Filled 2022-04-26: qty 10

## 2022-04-26 MED ORDER — FENTANYL CITRATE (PF) 100 MCG/2ML IJ SOLN
INTRAMUSCULAR | Status: AC
  Filled 2022-04-26: qty 2

## 2022-04-26 MED ORDER — PROPOFOL 10 MG/ML IV BOLUS
INTRAVENOUS | Status: DC | PRN
  Administered 2022-04-26: 100 mg via INTRAVENOUS

## 2022-04-26 SURGICAL SUPPLY — 23 items
BAG DRAIN URO TABLE W/ADPT NS (BAG) ×3 IMPLANT
BAG HAMPER (MISCELLANEOUS) ×3 IMPLANT
BAG URINE DRAIN 2000ML AR STRL (UROLOGICAL SUPPLIES) ×1 IMPLANT
CATH FOLEY 3WAY 30CC 22FR (CATHETERS) ×1 IMPLANT
CATH INTERMIT  6FR 70CM (CATHETERS) ×3 IMPLANT
CLOTH BEACON ORANGE TIMEOUT ST (SAFETY) ×3 IMPLANT
ELECT LOOP 22F BIPOLAR SML (ELECTROSURGICAL) ×3
ELECTRODE LOOP 22F BIPOLAR SML (ELECTROSURGICAL) IMPLANT
GLOVE BIO SURGEON STRL SZ8 (GLOVE) ×3 IMPLANT
GLOVE BIOGEL PI IND STRL 7.0 (GLOVE) ×4 IMPLANT
GLOVE BIOGEL PI INDICATOR 7.0 (GLOVE) ×2
GOWN STRL REUS W/TWL LRG LVL3 (GOWN DISPOSABLE) ×3 IMPLANT
GOWN STRL REUS W/TWL XL LVL3 (GOWN DISPOSABLE) ×3 IMPLANT
GUIDEWIRE STR DUAL SENSOR (WIRE) ×3 IMPLANT
GUIDEWIRE STR ZIPWIRE 035X150 (MISCELLANEOUS) ×3 IMPLANT
IV NS IRRIG 3000ML ARTHROMATIC (IV SOLUTION) ×6 IMPLANT
KIT TURNOVER CYSTO (KITS) ×3 IMPLANT
MANIFOLD NEPTUNE II (INSTRUMENTS) ×3 IMPLANT
PACK CYSTO (CUSTOM PROCEDURE TRAY) ×3 IMPLANT
PAD ARMBOARD 7.5X6 YLW CONV (MISCELLANEOUS) ×3 IMPLANT
STENT URET 6FRX26 CONTOUR (STENTS) ×1 IMPLANT
SYR 10ML LL (SYRINGE) ×3 IMPLANT
WATER STERILE IRR 500ML POUR (IV SOLUTION) ×3 IMPLANT

## 2022-04-26 NOTE — Op Note (Signed)
Preoperative diagnosis: left hydronephrosis  Postop diagnosis: left hydronephrosis and bladder tumor  Procedure: 1.  Cystoscopy 2. left retrograde pyelography 3. Intra-operative fluoroscopy, under 1 hour, with interpretation 4.Transurethral resection of bladder tumor, small 5. Left 6x26 JJ ureteral Stent Placement     Attending: [name]  Anesthesia: General  Estimated blood loss: 5 cc  Drains: 1. 22 French Foley catheter 2. Left 6x26 JJ ureteral Stent  Specimens: 1. Bladder tumor 2. Left renal pelvis urine for culture Antibiotics: Ancef  Findings: multiple 59m posterior wall papillary bladder tumors. Severe left hydronephrosis. Bloody, purulent drainage from left renal pelvis.  Indications: Patient is a 81year old with a history of left transitional cell carcinoma of the renal pelvis. He was admitted with sepsis from a urinary source and on CT was found to have severe left hydronephrosis from an obstructing UPJ tumor.  After discussing treatment options patient decided to proceed with left ureteral stent placement.  Procedure in detail: Prior to procedure consetn was obtained. Patient was brought to the operating room and briefing was done sure correct patient, correct procedure, correct site.  General anesthesia was in administered patient was placed in the dorsal lithotomy position.  The rigid 267French cystoscope was passed urethra and bladder.  Bladder was inspected masses or lesions and we noted 2 posterior wall 564mpapillary bladder tumors.  The left ureteral orifice was cannulated with a 6 French ureteral catheter.  A gentle retrograde was obtained in findings noted above.  We then placed a zip wire through the ureteral catheter and advanced up to the renal pelvis. We then placed a 6 x 26 double-J ureteral stent over the wire.  We then removed the wire and good coil was noted in the pelvis under fluoroscopy in the bladder under direct vision.  Removed the cystoscope and  placed a 2761rench resectoscope in the bladder.  Using bipolar electrocautery were then removed the 2 bladder tumors.  We removed the bladder tumors down to the base exposing muscle.  We then removed the pieces and sent them for pathology.  To obtain hemostasis we then cauterized the bed of the tumors.  Once good hemostasis was noted the bladder was then drained and a 22 French Foley catheter was placed. This concluded the procedure which resulted by the patient.  Complications: None  Condition: Stable,  extubated, transferred to PACU.  Plan: The patient is to be admitted overnight for sepsis management.

## 2022-04-26 NOTE — Anesthesia Preprocedure Evaluation (Signed)
Anesthesia Evaluation  Patient identified by MRN, date of birth, ID band Patient awake    Reviewed: Allergy & Precautions, NPO status , Patient's Chart, lab work & pertinent test results  Airway Mallampati: III  TM Distance: >3 FB Neck ROM: Full    Dental  (+) Dental Advisory Given, Missing, Chipped, Caps, Poor Dentition   Pulmonary former smoker,    Pulmonary exam normal breath sounds clear to auscultation       Cardiovascular negative cardio ROS Normal cardiovascular exam Rhythm:Regular Rate:Normal  1. Left ventricular ejection fraction, by estimation, is 60 to 65%. The  left ventricle has normal function. The left ventricle has no regional  wall motion abnormalities. There is mild left ventricular hypertrophy.  Left ventricular diastolic parameters  were normal.  2. Right ventricular systolic function is normal. The right ventricular  size is normal. There is normal pulmonary artery systolic pressure.  3. The mitral valve is normal in structure. Trivial mitral valve  regurgitation. No evidence of mitral stenosis.  4. The tricuspid valve is abnormal.  5. The aortic valve is tricuspid. There is mild calcification of the  aortic valve. There is mild thickening of the aortic valve. Aortic valve  regurgitation is not visualized. No aortic stenosis is present.  6. The inferior vena cava is normal in size with greater than 50%  respiratory variability, suggesting right atrial    Neuro/Psych negative neurological ROS  negative psych ROS   GI/Hepatic Neg liver ROS, GERD  Medicated and Controlled,  Endo/Other  diabetes, Well Controlled, Type 2, Oral Hypoglycemic Agents  Renal/GU Renal InsufficiencyRenal disease (AKI) Bladder dysfunction (Bladder cancer)      Musculoskeletal negative musculoskeletal ROS (+)   Abdominal   Peds negative pediatric ROS (+)  Hematology  (+) Blood dyscrasia, anemia ,   Anesthesia  Other Findings   Reproductive/Obstetrics negative OB ROS                            Anesthesia Physical Anesthesia Plan  ASA: 3  Anesthesia Plan: General   Post-op Pain Management: Dilaudid IV   Induction: Intravenous  PONV Risk Score and Plan: 3 and Ondansetron  Airway Management Planned: LMA, Natural Airway and Nasal Cannula  Additional Equipment:   Intra-op Plan:   Post-operative Plan:   Informed Consent: I have reviewed the patients History and Physical, chart, labs and discussed the procedure including the risks, benefits and alternatives for the proposed anesthesia with the patient or authorized representative who has indicated his/her understanding and acceptance.     Dental advisory given  Plan Discussed with: CRNA and Surgeon  Anesthesia Plan Comments:        Anesthesia Quick Evaluation

## 2022-04-26 NOTE — Interval H&P Note (Signed)
History and Physical Interval Note:  04/26/2022 7:36 AM  Joshua Osborne  has presented today for surgery, with the diagnosis of left hydronephrosis, fever.  The various methods of treatment have been discussed with the patient and family. After consideration of risks, benefits and other options for treatment, the patient has consented to  Procedure(s): [place], URETEROSCOPY AND STENT PLACEMENT (Left) as a surgical intervention.  The patient's history has been reviewed, patient examined, no change in status, stable for surgery.  I have reviewed the patient's chart and labs.  Questions were answered to the patient's satisfaction.     [name]

## 2022-04-26 NOTE — Anesthesia Procedure Notes (Signed)
Procedure Name: LMA Insertion Date/Time: 04/26/2022 7:53 AM  Performed by: Orlie Dakin, CRNAPre-anesthesia Checklist: Patient identified, Emergency Drugs available, Suction available and Patient being monitored Patient Re-evaluated:Patient Re-evaluated prior to induction Oxygen Delivery Method: Circle system utilized Preoxygenation: Pre-oxygenation with 100% oxygen Induction Type: IV induction Ventilation: Mask ventilation without difficulty LMA: LMA inserted LMA Size: 4.0 Tube type: Oral Number of attempts: 1 Placement Confirmation: positive ETCO2 Tube secured with: Tape Dental Injury: Teeth and Oropharynx as per pre-operative assessment

## 2022-04-26 NOTE — Assessment & Plan Note (Signed)
-  WBC trending down

## 2022-04-26 NOTE — Transfer of Care (Signed)
Immediate Anesthesia Transfer of Care Note  Patient: Joshua Osborne  Procedure(s) Performed: CYSTOSCOPY WITH RETROGRADE PYELOGRAM, URETEROSCOPY AND STENT PLACEMENT (Left: Ureter) TRANSURETHRAL RESECTION OF BLADDER TUMOR (TURBT) (Bladder)  Patient Location: PACU  Anesthesia Type:General  Level of Consciousness: awake, alert  and oriented  Airway & Oxygen Therapy: Patient Spontanous Breathing and Patient connected to face mask oxygen  Post-op Assessment: Report given to RN and Post -op Vital signs reviewed and stable  Post vital signs: Reviewed and stable  Last Vitals:  Vitals Value Taken Time  BP 140/83 04/26/22 0838  Temp    Pulse 111 04/26/22 0840  Resp 16 04/26/22 0840  SpO2 100 % 04/26/22 0840  Vitals shown include unvalidated device data.  Last Pain:  Vitals:   04/26/22 0559  TempSrc:   PainSc: 3       Patients Stated Pain Goal: 4 (42/87/68 1157)  Complications: No notable events documented.

## 2022-04-26 NOTE — Anesthesia Postprocedure Evaluation (Signed)
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   Last Vitals:  Vitals:   04/26/22 0900 04/26/22 0924  BP: (!) 152/71   Pulse: 90   Resp: 10   Temp: 37.1 C 37 C  SpO2: 96%     Last Pain:  Vitals:   04/26/22 0924  TempSrc: Oral  PainSc:                  Jaceyon Strole C Labrittany Wechter

## 2022-04-27 ENCOUNTER — Inpatient Hospital Stay (HOSPITAL_COMMUNITY): Payer: MEDICARE

## 2022-04-27 ENCOUNTER — Encounter (HOSPITAL_COMMUNITY): Payer: Self-pay | Admitting: Urology

## 2022-04-27 DIAGNOSIS — C672 Malignant neoplasm of lateral wall of bladder: Secondary | ICD-10-CM

## 2022-04-27 DIAGNOSIS — N10 Acute tubulo-interstitial nephritis: Secondary | ICD-10-CM | POA: Diagnosis not present

## 2022-04-27 DIAGNOSIS — J9601 Acute respiratory failure with hypoxia: Secondary | ICD-10-CM | POA: Diagnosis not present

## 2022-04-27 DIAGNOSIS — C652 Malignant neoplasm of left renal pelvis: Secondary | ICD-10-CM | POA: Diagnosis not present

## 2022-04-27 DIAGNOSIS — N1330 Unspecified hydronephrosis: Secondary | ICD-10-CM

## 2022-04-27 DIAGNOSIS — A419 Sepsis, unspecified organism: Secondary | ICD-10-CM | POA: Diagnosis not present

## 2022-04-27 DIAGNOSIS — N179 Acute kidney failure, unspecified: Secondary | ICD-10-CM | POA: Diagnosis not present

## 2022-04-27 LAB — GLUCOSE, CAPILLARY
Glucose-Capillary: 150 mg/dL — ABNORMAL HIGH (ref 70–99)
Glucose-Capillary: 192 mg/dL — ABNORMAL HIGH (ref 70–99)
Glucose-Capillary: 176 mg/dL — ABNORMAL HIGH (ref 70–99)
Glucose-Capillary: 150 mg/dL — ABNORMAL HIGH (ref 70–99)

## 2022-04-27 LAB — URINE CULTURE: Culture: NO GROWTH

## 2022-04-27 LAB — CBC
HCT: 26.6 % — ABNORMAL LOW (ref 39.0–52.0)
Hemoglobin: 8.6 g/dL — ABNORMAL LOW (ref 13.0–17.0)
MCH: 22.1 pg — ABNORMAL LOW (ref 26.0–34.0)
MCHC: 32.3 g/dL (ref 30.0–36.0)
MCV: 68.4 fL — ABNORMAL LOW (ref 80.0–100.0)
Platelets: 473 10*3/uL — ABNORMAL HIGH (ref 150–400)
RBC: 3.89 MIL/uL — ABNORMAL LOW (ref 4.22–5.81)
RDW: 21.2 % — ABNORMAL HIGH (ref 11.5–15.5)
WBC: 19.4 10*3/uL — ABNORMAL HIGH (ref 4.0–10.5)
nRBC: 0 % (ref 0.0–0.2)

## 2022-04-27 LAB — COMPREHENSIVE METABOLIC PANEL
ALT: 52 U/L — ABNORMAL HIGH (ref 0–44)
AST: 24 U/L (ref 15–41)
Albumin: 2.7 g/dL — ABNORMAL LOW (ref 3.5–5.0)
Alkaline Phosphatase: 192 U/L — ABNORMAL HIGH (ref 38–126)
Anion gap: 9 (ref 5–15)
BUN: 23 mg/dL (ref 8–23)
CO2: 27 mmol/L (ref 22–32)
Calcium: 8.7 mg/dL — ABNORMAL LOW (ref 8.9–10.3)
Chloride: 100 mmol/L (ref 98–111)
Creatinine, Ser: 1.39 mg/dL — ABNORMAL HIGH (ref 0.61–1.24)
GFR, Estimated: 52 mL/min — ABNORMAL LOW (ref >60)
Glucose, Bld: 163 mg/dL — ABNORMAL HIGH (ref 70–99)
Potassium: 4 mmol/L (ref 3.5–5.1)
Sodium: 136 mmol/L (ref 135–145)
Total Bilirubin: 0.6 mg/dL (ref 0.3–1.2)
Total Protein: 6.4 g/dL — ABNORMAL LOW (ref 6.5–8.1)

## 2022-04-27 LAB — CULTURE, BLOOD (ROUTINE X 2)
Culture: NO GROWTH
Culture: NO GROWTH
Special Requests: ADEQUATE

## 2022-04-27 LAB — MAGNESIUM: Magnesium: 2.1 mg/dL (ref 1.7–2.4)

## 2022-04-27 LAB — SURGICAL PATHOLOGY

## 2022-04-27 MED ORDER — HYDROMORPHONE HCL 1 MG/ML IJ SOLN: 0.5000 mg | INTRAMUSCULAR | Status: DC | PRN

## 2022-04-27 MED ORDER — CHLORHEXIDINE GLUCONATE CLOTH 2 % EX PADS
6.0000 | MEDICATED_PAD | Freq: Every day | CUTANEOUS | Status: DC
Start: 2022-04-27 — End: 2022-04-28
  Administered 2022-04-27 – 2022-04-28 (×2): 6 via TOPICAL

## 2022-04-27 MED ORDER — FERUMOXYTOL INJECTION 510 MG/17 ML
510.0000 mg | Freq: Once | INTRAVENOUS | Status: AC
  Administered 2022-04-27: 510 mg via INTRAVENOUS
  Filled 2022-04-27 (×2): qty 17

## 2022-04-27 NOTE — Progress Notes (Signed)
PROGRESS NOTE  Joshua Osborne OZH:[id number] DOB: 01-18-1941 DOA: 04/21/2022 PCP: Earney Mallet, MD  Brief History:  81 year old male with a history of depression, diabetes mellitus, urothelial carcinoma, hyperlipidemia presenting with 2-week history of generalized weakness.  In the past couple days, the patient has had fevers and chills.  He actually had shivering and rigors on the morning of 04/21/2022.  He has had poor oral intake.  His wife took his temperature on the morning of 04/21/2022 and it was 100.0 F.  He went to see his urologist, Dr. Lovena Neighbours, on 04/20/2022.  He was diagnosed with a UTI and started on cephalexin.  He took 2 doses prior to coming to the hospital.  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  He is scheduled tentatively for a laparoscopic left-sided nephroureterectomy on 05/30/2022.  He had 2 episodes of nausea and vomiting prior to coming to the hospital on the morning of admission.  He denies any abdominal pain but complains of left-sided flank pain which has worsened over the last couple days prior to admission.  He has been taking oxycodone 5 mg approximately twice daily since his renal biopsy.  He denies any headache, neck pain, chest pain, shortness of breath, cough, hemoptysis.  There is no diarrhea, hematochezia, melena, abdominal pain.  He has some intermittent dysuria. In the ED, the patient was febrile up to 101.7 F.  He was tachycardic into the 140s.  He was hemodynamically stable.  WBC 34.4, hemoglobin 8.4, platelets 266,000.  Sodium 133, potassium 4.5, bicarbonate 23, serum creatinine 1.76.  AST 32, ALT 47, ALP 157, total bilirubin 1.3.  UA shows greater than 50 WBC, 21-50 RBC.  Lactic acid 1.2.  CT of the abdomen and pelvis showed increased left hydronephrosis with hydroureter secondary to persistent soft tissue within the left renal collecting system consistent with tumor extending to the UPJ.   There is nonobstructive left renal calculi.  The patient was given the appropriate fluid boluses and started on ceftriaxone.  He was admitted for further evaluation and treatment.  04/26/2022: left ureteral stent placement by Dr. Alyson Ingles     Assessment and Plan: * Septic shock [facility] [place]) Presented with fever, tachycardia, leukocytosis Required low dose levophed overnight 7/14-7/15>>weaned off Lactate peaked 1.2 UA>50 WBC Follow blood and urine cultures--negative to date but pt was on cephalexin prior to admission CXR--no infiltrates Antibiotics broaden to zosyn WBC up to 41.6>>32.2>>22.4>>21.0 Continue IVF>>saline lock on 7/17 due to fluid overload -afebrile for last 24 hours  Acute respiratory failure with hypoxia ([place]) 7/17 developed hypoxia and tachypnea -stable on 2-3L -due to pulm edema -personally reviewed 7/17 CXR--pulm edema, vasc congestion -saline lock IVF -lasix IV 40 mg x 1 on 7/17 -Echo: LVEF 60-65%   Hypomagnesemia repleted  AKI (acute kidney injury) ([place]) Multifactorial including sepsis/hypotension, obstruction, hypovolemia -baseline creatiint 0.9-1.0 -serum creatinine peaked 1.76>>1.60>>1.66>>1.72 -overall slowly improving -continue IVF>>saline lock as pt is not fluid overloaed -urology consulted>>discussed with Dr. McKenzie>>s/p left ureteral stent 7/19  Cancer related pain Left flank pain not well controlled D/c oxycodone continue dilaudid prn Changed back to po dilaudid after discussion with Dr. Alyson Ingles 7/17  Mixed hyperlipidemia Holding statin temporarily  Controlled type 2 diabetes mellitus without complication, without long-term current use of insulin (HCC) Hold metformin 04/21/22 A1C--5.9 novolog sliding scale CBG (last 3)  Recent Labs    04/26/22 2042 04/27/22 0752 04/27/22 1111  GLUCAP 234* 150* 192*  Acute pyelonephritis WBC up 41.6,>>32.2>22.4>>21.0 --fever curve is improving>>spiked fever 7/17 around 1745 --pt clinically  stable --broaden abx pending culture data>>zosyn started 7/15 --urine culture is neg but pt was on cephalexin prior to admission -04/24/22--repeat blood and urine culture--respiked fever 103.1 -urology consulted>>discussed with Dr. Alyson Ingles -left ureteral stent placed 04/26/2022  -per Dr. 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  No hypermetabolic lymph nodes.  No liver lesions hypermetabolic. -He underwent ureteroscopy and biopsy on 03/27/2022. 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  Muscularis propria not identified in the current specimen --laparoscopic left nephroureterectomy and TURBT tentatively planned 05/30/22  Transitional cell carcinoma of renal pelvis, left (HCC) -PET scan (03/16/2022): Soft tissue mass within the left renal pelvis, SUV 6.7.  No hypermetabolic lymph nodes.  No liver lesions hypermetabolic. -He underwent ureteroscopy and biopsy on 03/27/2022. 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  Muscularis propria not identified in the current specimen --laparoscopic left nephroureterectomy tentatively planned 05/30/22 --appreciate Dr. Alyson Ingles consult  Symptomatic anemia Transfused one unit PRBC 7/15 -transfused one unit PRBC 7/17 -hg up to 8.6 and stable   Leukocytosis -WBC trending down   Family Communication:   Family updated at bedside 7/19  Consultants:  urology  Code Status:  FULL   DVT Prophylaxis:  [place] Lovenox   Procedures: As Listed in Progress Note Above  Antibiotics: Ceftriaxone 7/14>>7/15 Zosyn 7/15>>  Subjective: Patient feeling much better today.      Objective: Vitals:   04/26/22 1300 04/26/22 2045 04/27/22 0040 04/27/22 0419  BP: (!) 148/69 136/75 129/73 132/69  Pulse: 85 80 76 70  Resp: '16 20 18 18  '$ Temp: 98.3 F (36.8 C) 98.1 F (36.7 C)  98.2 F (36.8 C) 97.8 F (36.6 C)  TempSrc: Oral Oral Oral   SpO2: 96% 97% 96% 94%  Weight:      Height:        Intake/Output Summary (Last 24 hours) at 04/27/2022 1247 Last data filed at 04/27/2022 0900 Gross per 24 hour  Intake 1414.91 ml  Output 1525 ml  Net -110.09 ml   Weight change:  Exam:  General:  Pt is alert, follows commands appropriately, not in acute distress HEENT: No icterus, No thrush, No neck mass, [place]/AT Cardiovascular: RRR, S1/S2, no rubs, no gallops Respiratory: CTA bilaterally, no wheezing, no crackles, no rhonchi Abdomen: Soft/+BS, non tender, non distended, no guarding; left costovertebral tenderness Extremities: No edema, No lymphangitis, No petechiae, No rashes, no synovitis  Data Reviewed: I have personally reviewed following labs and imaging studies Basic Metabolic Panel: Recent Labs  Lab 04/23/22 0413 04/24/22 0352 04/25/22 0408 04/25/22 1415 04/26/22 0416 04/27/22 0517  NA 135 136 135 135  --  136  K 3.7 3.7 4.3 4.2  --  4.0  CL 102 105 99 98  --  100  CO2 '23 23 25 27  '$ --  27  GLUCOSE 167* 160* 148* 166*  --  163*  BUN '21 21 21 19  '$ --  23  CREATININE 1.60* 1.66* 1.72* 1.61*  --  1.39*  CALCIUM 8.3* 8.1* 8.4* 8.5*  --  8.7*  MG 1.4* 1.8 1.7  --  2.0 2.1   Liver Function Tests: Recent Labs  Lab 04/22/22 0350 04/23/22 0413 04/25/22 0408 04/25/22 1415 04/27/22 0517  AST 30 33 67* 57* 24  ALT 39 42 86* 80* 52*  ALKPHOS  134* 150* 253* 246* 192*  BILITOT 1.1 1.2 1.7* 1.3* 0.6  PROT 6.4* 6.4* 6.8 7.0 6.4*  ALBUMIN 3.0* 2.9* 2.9* 2.9* 2.7*   No results for input(s): "LIPASE", "AMYLASE" in the last 168 hours. No results for input(s): "AMMONIA" in the last 168 hours. Coagulation Profile: Recent Labs  Lab 04/21/22 1114  INR 1.2   CBC: Recent Labs  Lab 04/21/22 1114 04/22/22 0954 04/23/22 0413 04/24/22 0352 04/24/22 0626 04/24/22 1642 04/25/22 0408 04/26/22 0416 04/27/22 0517  WBC 34.4*   < > 32.2* 22.4*  --   --  21.0*  20.6* 19.4*  NEUTROABS 31.1*  --  28.8* 19.5*  --   --   --   --   --   HGB 8.4*   < > 7.6* 6.8* 7.1* 9.4* 9.1* 8.6* 8.6*  HCT 26.2*   < > 23.8* 20.7* 21.6* 28.3* 28.1* 26.3* 26.6*  MCV 64.2*   < > 66.5* 65.3*  --   --  67.9* 67.4* 68.4*  PLT 366   < > 316 316  --   --  385 424* 473*   < > = values in this interval not displayed.   Cardiac Enzymes: No results for input(s): "CKTOTAL", "CKMB", "CKMBINDEX", "TROPONINI" in the last 168 hours. BNP: Invalid input(s): "POCBNP" CBG: Recent Labs  Lab 04/26/22 1145 04/26/22 1618 04/26/22 2042 04/27/22 0752 04/27/22 1111  GLUCAP 203* 244* 234* 150* 192*   HbA1C: No results for input(s): "HGBA1C" in the last 72 hours. Urine analysis:    Component Value Date/Time   COLORURINE STRAW (A) 04/24/2022 1935   APPEARANCEUR CLEAR 04/24/2022 1935   LABSPEC 1.006 04/24/2022 1935   PHURINE 6.0 04/24/2022 1935   GLUCOSEU NEGATIVE 04/24/2022 1935   HGBUR SMALL (A) 04/24/2022 [place] NEGATIVE 04/24/2022 [place] NEGATIVE 04/24/2022 1935   PROTEINUR NEGATIVE 04/24/2022 1935   NITRITE NEGATIVE 04/24/2022 1935   LEUKOCYTESUR NEGATIVE 04/24/2022 1935    Recent Results (from the past 240 hour(s))  Culture, blood (Routine x 2)     Status: None   Collection Time: 04/21/22 11:17 AM   Specimen: BLOOD RIGHT ARM  Result Value Ref Range Status   Specimen Description   Final    BLOOD RIGHT ARM BOTTLES DRAWN AEROBIC AND ANAEROBIC   Special Requests   Final    Blood Culture results may not be optimal due to an excessive volume of blood received in culture bottles   Culture   Final    NO GROWTH 6 DAYS Performed at [facility], [address] [place], [place] [address]    Report Status 04/27/2022 FINAL  Final  Culture, blood (Routine x 2)     Status: None   Collection Time: 04/21/22 11:17 AM   Specimen: BLOOD LEFT ARM  Result Value Ref Range Status   Specimen Description BLOOD LEFT ARM BOTTLES DRAWN AEROBIC AND ANAEROBIC  Final    Special Requests Blood Culture adequate volume  Final   Culture   Final    NO GROWTH 6 DAYS Performed at [facility], [address] [place], [place] [address]    Report Status 04/27/2022 FINAL  Final  Urine Culture     Status: None   Collection Time: 04/21/22 11:49 AM   Specimen: Urine, Clean Catch  Result Value Ref Range Status   Specimen Description   Final    URINE, CLEAN CATCH Performed at [facility], [address], [place], [place] [address]    Special Requests  Final    NONE Performed at [facility], [address] [place], [place] [address]    Culture   Final    NO GROWTH Performed at [facility], [place] [address], [place], [place] [address]    Report Status 04/23/2022 FINAL  Final  MRSA Next Gen by PCR, Nasal     Status: None   Collection Time: 04/21/22  3:39 PM   Specimen: Nasal Mucosa; Nasal Swab  Result Value Ref Range Status   MRSA by PCR Next Gen NOT DETECTED NOT DETECTED Final    Comment: (NOTE) The GeneXpert MRSA Assay (FDA approved for NASAL specimens only), is one component of a comprehensive MRSA colonization surveillance program. It is not intended to diagnose MRSA infection nor to guide or monitor treatment for MRSA infections. Test performance is not FDA approved in patients less than 33 years old. Performed at [facility], [address], [place], [place] [address]   Culture, blood (Routine X 2) w Reflex to ID Panel     Status: None (Preliminary result)   Collection Time: 04/24/22  6:38 PM   Specimen: Right Antecubital; Blood  Result Value Ref Range Status   Specimen Description RIGHT ANTECUBITAL  Final   Special Requests   Final    BOTTLES DRAWN AEROBIC AND ANAEROBIC Blood Culture adequate volume   Culture   Final    NO GROWTH 3 DAYS Performed at [facility], [address] [place], [place] [address]    Report Status PENDING  Incomplete  Culture, blood (Routine X 2) w Reflex to ID Panel     Status: None (Preliminary result)    Collection Time: 04/24/22  6:38 PM   Specimen: Left Antecubital; Blood  Result Value Ref Range Status   Specimen Description LEFT ANTECUBITAL  Final   Special Requests   Final    BOTTLES DRAWN AEROBIC AND ANAEROBIC Blood Culture adequate volume   Culture   Final    NO GROWTH 3 DAYS Performed at [facility], [address], [place], [place] [address]    Report Status PENDING  Incomplete  Urine Culture     Status: None   Collection Time: 04/24/22  7:35 PM   Specimen: Urine, Clean Catch  Result Value Ref Range Status   Specimen Description   Final    URINE, CLEAN CATCH Performed at [facility], [address], [place], [place] [address]    Special Requests   Final    NONE Performed at [facility], [address] [place], [place] [address]    Culture   Final    NO GROWTH Performed at [facility], [place] [address] [place], [place] [address]    Report Status 04/26/2022 FINAL  Final     Scheduled Meds:  Chlorhexidine Gluconate Cloth  6 each Topical Daily   citalopram  10 mg Oral Daily   enoxaparin (LOVENOX) injection  40 mg Subcutaneous Q24H   feeding supplement  237 mL Oral TID BM   insulin aspart  0-9 Units Subcutaneous TID WC   latanoprost  1 drop Both Eyes QHS   multivitamin with minerals  1 tablet Oral Daily   niacin  500 mg Oral QHS   pantoprazole  40 mg Oral Daily   tamsulosin  0.4 mg Oral Daily   tiZANidine  2 mg Oral QHS   Continuous Infusions:  sodium chloride     piperacillin-tazobactam (ZOSYN)  IV 3.375 g (04/27/22 0550)    Procedures/Studies: DG CHEST PORT  1 VIEW  Result Date: 04/27/2022 CLINICAL DATA:  81 year old male with 2 week history of generalized weakness admitted with septic shock EXAM: PORTABLE CHEST 1 VIEW COMPARISON:  Radiographs 04/24/2022 FINDINGS: Since 04/24/2022, improved lung volumes and slightly decreased pulmonary vascular congestion. Cardiomediastinal silhouette at the upper limits of normal. Pulmonary vascular congestion. Bibasilar  atelectasis/consolidation. Probable small left pleural effusion. Aortic calcifications. IMPRESSION: Findings favored to represent improving pulmonary edema with infection less likely though not excluded. Electronically Signed   By: Placido Sou M.D.   On: 04/27/2022 08:26   DG C-Arm 1-60 Min-No Report  Result Date: 04/26/2022 Fluoroscopy was utilized by the requesting physician.  No radiographic interpretation.   ECHOCARDIOGRAM COMPLETE  Result Date: 04/25/2022    ECHOCARDIOGRAM REPORT   Patient Name:   TAHJ NJOKU Date of Exam: 04/25/2022 Medical Rec #:  [id number]        Height:       72.0 in Accession #:    [id number]       Weight:       182.3 lb Date of Birth:  [birth year]         BSA:          2.048 m Patient Age:    31 years         BP:           161/64 mmHg Patient Gender: M                HR:           79 bpm. Exam Location:  [facility] Procedure: 2D Echo, Cardiac Doppler and Color Doppler Indications:    CHF  History:        Patient has no prior history of Echocardiogram examinations.                 Risk Factors:Diabetes and Dyslipidemia. Bladder CA.  Sonographer:    Wenda Low Referring Phys: [id number] DAVID TAT IMPRESSIONS  1. Left ventricular ejection fraction, by estimation, is 60 to 65%. The left ventricle has normal function. The left ventricle has no regional wall motion abnormalities. There is mild left ventricular hypertrophy. Left ventricular diastolic parameters were normal.  2. Right ventricular systolic function is normal. The right ventricular size is normal. There is normal pulmonary artery systolic pressure.  3. The mitral valve is normal in structure. Trivial mitral valve regurgitation. No evidence of mitral stenosis.  4. The tricuspid valve is abnormal.  5. The aortic valve is tricuspid. There is mild calcification of the aortic valve. There is mild thickening of the aortic valve. Aortic valve regurgitation is not visualized. No aortic stenosis is present.  6. The inferior  vena cava is normal in size with greater than 50% respiratory variability, suggesting right atrial pressure of 3 mmHg. FINDINGS  Left Ventricle: Left ventricular ejection fraction, by estimation, is 60 to 65%. The left ventricle has normal function. The left ventricle has no regional wall motion abnormalities. The left ventricular internal cavity size was normal in size. There is  mild left ventricular hypertrophy. Left ventricular diastolic parameters were normal. Right Ventricle: The right ventricular size is normal. Right vetricular wall thickness was not well visualized. Right ventricular systolic function is normal. There is normal pulmonary artery systolic pressure. The tricuspid regurgitant velocity is 2.70 m/s, and with an assumed right atrial pressure of 3 mmHg, the estimated right ventricular systolic pressure is 62.1 mmHg. Left Atrium: Left atrial size was normal in size. Right Atrium: Right atrial size  was normal in size. Pericardium: There is no evidence of pericardial effusion. Mitral Valve: The mitral valve is normal in structure. Trivial mitral valve regurgitation. No evidence of mitral valve stenosis. MV peak gradient, 5.2 mmHg. The mean mitral valve gradient is 2.0 mmHg. Tricuspid Valve: The tricuspid valve is abnormal. Tricuspid valve regurgitation is mild . No evidence of tricuspid stenosis. Aortic Valve: The aortic valve is tricuspid. There is mild calcification of the aortic valve. There is mild thickening of the aortic valve. There is mild aortic valve annular calcification. Aortic valve regurgitation is not visualized. No aortic stenosis  is present. Aortic valve mean gradient measures 3.0 mmHg. Aortic valve peak gradient measures 5.7 mmHg. Aortic valve area, by VTI measures 2.21 cm. Pulmonic Valve: The pulmonic valve was not well visualized. Pulmonic valve regurgitation is mild. No evidence of pulmonic stenosis. Aorta: The aortic root is normal in size and structure. Venous: The inferior  vena cava is normal in size with greater than 50% respiratory variability, suggesting right atrial pressure of 3 mmHg. IAS/Shunts: No atrial level shunt detected by color flow Doppler.  LEFT VENTRICLE PLAX 2D LVIDd:         4.50 cm     Diastology LVIDs:         3.20 cm     LV e' medial:    8.05 cm/s LV PW:         1.30 cm     LV E/e' medial:  12.4 LV IVS:        1.20 cm     LV e' lateral:   10.80 cm/s LVOT diam:     1.90 cm     LV E/e' lateral: 9.3 LV SV:         62 LV SV Index:   30 LVOT Area:     2.84 cm  LV Volumes (MOD) LV vol d, MOD A2C: 62.4 ml LV vol d, MOD A4C: 63.7 ml LV vol s, MOD A2C: 25.0 ml LV vol s, MOD A4C: 27.0 ml LV SV MOD A2C:     37.4 ml LV SV MOD A4C:     63.7 ml LV SV MOD BP:      39.2 ml RIGHT VENTRICLE RV Basal diam:  3.65 cm RV Mid diam:    3.50 cm RV S prime:     14.10 cm/s TAPSE (M-mode): 2.1 cm LEFT ATRIUM             Index        RIGHT ATRIUM           Index LA diam:        4.30 cm 2.10 cm/m   RA Area:     17.30 cm LA Vol (A2C):   48.4 ml 23.63 ml/m  RA Volume:   44.30 ml  21.63 ml/m LA Vol (A4C):   45.3 ml 22.12 ml/m LA Biplane Vol: 47.9 ml 23.38 ml/m  AORTIC VALVE                    PULMONIC VALVE AV Area (Vmax):    2.36 cm     PV Vmax:       0.97 m/s AV Area (Vmean):   2.13 cm     PV Peak grad:  3.8 mmHg AV Area (VTI):     2.21 cm AV Vmax:           119.00 cm/s AV Vmean:          82.300 cm/s  AV VTI:            0.281 m AV Peak Grad:      5.7 mmHg AV Mean Grad:      3.0 mmHg LVOT Vmax:         99.20 cm/s LVOT Vmean:        61.800 cm/s LVOT VTI:          0.219 m LVOT/AV VTI ratio: 0.78  AORTA Ao Root diam: 3.50 cm Ao Asc diam:  3.80 cm MITRAL VALVE                TRICUSPID VALVE MV Area (PHT): 4.26 cm     TR Peak grad:   29.2 mmHg MV Area VTI:   1.66 cm     TR Vmax:        270.00 cm/s MV Peak grad:  5.2 mmHg MV Mean grad:  2.0 mmHg     SHUNTS MV Vmax:       1.14 m/s     Systemic VTI:  0.22 m MV Vmean:      69.2 cm/s    Systemic Diam: 1.90 cm MV Decel Time: 178 msec MV E  velocity: 100.00 cm/s MV A velocity: 107.00 cm/s MV E/A ratio:  0.93 Carlyle Dolly MD Electronically signed by Carlyle Dolly MD Signature Date/Time: 04/25/2022/4:06:13 PM    Final    CT ABDOMEN PELVIS WO CONTRAST  Result Date: 04/25/2022 CLINICAL DATA:  Left flank and abdominal pain generalized weakness, fever, chills, transitional cell carcinoma * Tracking Code: BO * EXAM: CT ABDOMEN AND PELVIS WITHOUT CONTRAST TECHNIQUE: Multidetector CT imaging of the abdomen and pelvis was performed following the standard protocol without IV contrast. Oral enteric contrast was administered. RADIATION DOSE REDUCTION: This exam was performed according to the departmental dose-optimization program which includes automated exposure control, adjustment of the mA and/or kV according to patient size and/or use of iterative reconstruction technique. COMPARISON:  04/21/2022 FINDINGS: Lower chest: No acute abnormality. Coronary artery calcifications. Mild fibrotic change in the included bilateral lung bases. Trace bilateral pleural effusions. Hepatobiliary: Calcified lesions of the right lobe of the liver, unchanged, incompletely characterized by noncontrast CT (series 2, image 17). Status post cholecystectomy. No biliary dilatation. Pancreas: Unremarkable. No pancreatic ductal dilatation or surrounding inflammatory changes. Spleen: Normal in size without significant abnormality. Adrenals/Urinary Tract: Adrenal glands are unremarkable. Small nonobstructive calculi of the left kidney. Unchanged severe left hydronephrosis with soft tissue present in the left renal pelvis, poorly evaluated by noncontrast CT (series 2, image 34). Bladder is unremarkable. Stomach/Bowel: Stomach is within normal limits. Appendix appears normal. No evidence of bowel wall thickening, distention, or inflammatory changes. Descending and sigmoid diverticulosis. Vascular/Lymphatic: Aortic atherosclerosis. No enlarged abdominal or pelvic lymph nodes.  Reproductive: Severe prostatomegaly. Other: No abdominal wall hernia or abnormality. No ascites. Musculoskeletal: No acute or significant osseous findings. IMPRESSION: 1. Unchanged severe left hydronephrosis with soft tissue present in the left renal pelvis, poorly evaluated by noncontrast CT. 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 2. Small nonobstructive calculi of the left kidney. 3. Severe prostatomegaly. 4. Descending and sigmoid diverticulosis without evidence of acute diverticulitis. 5. Trace bilateral pleural effusions. 6. Coronary artery disease. Aortic Atherosclerosis (ICD10-I70.0). Electronically Signed   By: Delanna Ahmadi M.D.   On: 04/25/2022 16:03   DG CHEST PORT 1 VIEW  Result Date: 04/24/2022 CLINICAL DATA:  Fever, chills and shortness of breath EXAM: PORTABLE CHEST 1 VIEW COMPARISON:  04/21/2022 FINDINGS: Heart size remains normal. Mediastinal shadows are  unremarkable except for mild atherosclerosis of the aorta. There is new abnormal bilateral pulmonary density in a pattern most consistent with pulmonary venous hypertension/fluid overload. Widespread developing pneumonia could have a similar appearance. IMPRESSION: Suspicion of congestive heart failure with developing edema. Developing widespread pneumonia could have a similar appearance, but is less likely. Electronically Signed   By: Nelson Chimes M.D.   On: 04/24/2022 18:53   CT ABDOMEN PELVIS W CONTRAST  Result Date: 04/21/2022 CLINICAL DATA:  LEFT flank pain and LEFT lower quadrant pain, fever, and chills for the past few days, suspected kidney stone; history of renal cell carcinoma, diverticulitis, pyelonephritis EXAM: CT ABDOMEN AND PELVIS WITH CONTRAST TECHNIQUE: Multidetector CT imaging of the abdomen and pelvis was performed using the standard protocol following bolus administration of intravenous contrast. RADIATION DOSE REDUCTION: This exam was performed according to the departmental dose-optimization program  which includes automated exposure control, adjustment of the mA and/or kV according to patient size and/or use of iterative reconstruction technique. CONTRAST:  82m OMNIPAQUE IOHEXOL 300 MG/ML SOLN IV. No oral contrast. COMPARISON:  03/07/2022 Correlation: PET-CT 03/16/2022 FINDINGS: Lower chest: Lung bases clear Hepatobiliary: Calcifications with surrounding low attenuation in the RIGHT lobe of the liver question treated metastases. Post cholecystectomy. No new hepatic abnormalities. Pancreas: Atrophic pancreas without mass Spleen: Normal appearance Adrenals/Urinary Tract: Adrenal glands and RIGHT kidney normal appearance. Again identified soft tissue within LEFT renal collecting system consistent with known tumor, extending to the reader 0 pelvic junction and causing increased LEFT hydronephrosis. Nonobstructing LEFT renal calculi. LEFT ureter is dilated in its course to the urinary bladder without calcification or mass. RIGHT ureter decompressed. Bladder unremarkable. Stomach/Bowel: Normal appendix. Diverticulosis of descending and sigmoid colon without evidence of diverticulitis. Stomach and remaining bowel loops unremarkable. Vascular/Lymphatic: Atherosclerotic calcifications aorta and iliac arteries without aneurysm. Vascular structures patent. No adenopathy. Reproductive: Significant prostatic enlargement, gland measuring 7.2 x 6.0 x 5.7 cm (volume = 130 cm^3). Seminal vesicles unremarkable. Other: No free air or free fluid. No hernia or inflammatory process. 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 Significant prostatic enlargement. Distal colonic diverticulosis without evidence of diverticulitis. 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 Aortic  Atherosclerosis (ICD10-I70.0). Electronically Signed   By: MLavonia DanaM.D.   On: 04/21/2022 12:49   DG Chest 2 View  Result Date: 04/21/2022 CLINICAL DATA:  Suspected sepsis EXAM: CHEST - 2 VIEW COMPARISON:  None Available. FINDINGS: The heart size and mediastinal contours are within normal limits. Both lungs are clear. The visualized skeletal structures are unremarkable. IMPRESSION: No active cardiopulmonary disease. Electronically Signed   By: DDorise BullionIII M.D.   On: 04/21/2022 11:30    CIrwin Brakeman MD  [facility]  If 7PM-7AM, please contact night-coverage [email or web address] Password [email or web address]    LOS: 6 days

## 2022-04-27 NOTE — Care Management Important Message (Signed)
Important Message  Patient Details  Name: Joshua Osborne MRN: [id number] Date of Birth: [birth year]   Medicare Important Message Given:  Yes     Tommy Medal 04/27/2022, 10:30 AM

## 2022-04-27 NOTE — Evaluation (Signed)
Physical Therapy Evaluation Patient Details Name: Joshua Osborne MRN: [id number] DOB: 1941/09/17 Today's Date: 04/27/2022  History of Present Illness  81 year old male with a history of depression, diabetes mellitus, urothelial carcinoma, hyperlipidemia presenting with 2-week history of generalized weakness.  In the past couple days, the patient has had fevers and chills.  He actually had shivering and rigors on the morning of 04/21/2022.  He has had poor oral intake.  His wife took his temperature on the morning of 04/21/2022 and it was 100.0 F.  He went to see his urologist, Dr. Lovena Neighbours, on 04/20/2022.  He was diagnosed with a UTI and started on cephalexin.  He took 2 doses prior to coming to the hospital.  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  He is scheduled tentatively for a laparoscopic left-sided nephroureterectomy on 05/30/2022.  He had 2 episodes of nausea and vomiting prior to coming to the hospital on the morning of admission.  He denies any abdominal pain but complains of left-sided flank pain which has worsened over the last couple days prior to admission.  He has been taking oxycodone 5 mg approximately twice daily since his renal biopsy.  He denies any headache, neck pain, chest pain, shortness of breath, cough, hemoptysis.  There is no diarrhea, hematochezia, melena, abdominal pain.  He has some intermittent dysuria.  In the ED, the patient was febrile up to 101.7 F.  He was tachycardic into the 140s.  He was hemodynamically stable.  WBC 34.4, hemoglobin 8.4, platelets 266,000.  Sodium 133, potassium 4.5, bicarbonate 23, serum creatinine 1.76.  AST 32, ALT 47, ALP 157, total bilirubin 1.3.  UA shows greater than 50 WBC, 21-50 RBC.  Lactic acid 1.2.  CT of the abdomen and pelvis showed increased left hydronephrosis with hydroureter secondary to persistent soft tissue within the left renal collecting system consistent with tumor  extending to the UPJ.  There is nonobstructive left renal calculi.  The patient was given the appropriate fluid boluses and started on ceftriaxone.  He was admitted for further evaluation and treatment.   Clinical Impression  Patient supine with wife and daughter present upon therapist arrival and agreeable to participating in PT evaluation. Patient performed bed mobility modified independent. Patient somewhat unsteady upon initially standing, reaching for objects to steady himself. Cues for sequencing of steps and placement of hands with use of RW. Patient  demonstrated somewhat slow cadence with and without assistive device. Patient demonstrated drifting left/right and stepping strategy and min guard to regain balance without assistive device. Trialed RW and patient more steady/stable with RW during ambulation. Patient mildly limited by fatigue and on room air throughout session. Discussed use of RW to promote stability as he regains his strength and stamina when returning home. Patient would benefit from ambulation with nursing while in this venue of care. Patient evaluated by Physical Therapy with no further acute PT needs identified. All education has been completed and the patient has no further questions.  See below for any follow-up Physical Therapy or equipment needs. PT is signing off. Thank you for this referral.      Recommendations for follow up therapy are one component of a multi-disciplinary discharge planning process, led by the attending physician.  Recommendations may be updated based on patient status, additional functional criteria and insurance authorization.  Follow Up Recommendations No PT follow up      Assistance Recommended at Discharge Set up Supervision/Assistance  Patient can return home  with the following  Assistance with cooking/housework;Assist for transportation;Help with stairs or ramp for entrance    Equipment Recommendations Rolling walker (2 wheels)   Recommendations for Other Services       Functional Status Assessment Patient has had a recent decline in their functional status and demonstrates the ability to make significant improvements in function in a reasonable and predictable amount of time.     Precautions / Restrictions Precautions Precautions: Fall Precaution Comments: patient reports no falls in the last six months Restrictions Weight Bearing Restrictions: No      Mobility  Bed Mobility Overal bed mobility: Modified Independent    Transfers Overall transfer level: Needs assistance Equipment used: Rolling walker (2 wheels), None Transfers: Sit to/from Stand, Bed to chair/wheelchair/BSC Sit to Stand: Min guard, Supervision   Step pivot transfers: Min guard, Supervision   Anterior-Posterior transfers: Supervision, Min guard   General transfer comment: Patient initially unsteady upon standing without assistive device' more stable with RW; cues for sequencing of steps and placement of hands with use of RW    Ambulation/Gait Ambulation/Gait assistance: Supervision, Min guard Gait Distance (Feet): 500 Feet (250' without AD; 250" with RW) Assistive device: Rolling walker (2 wheels), None Gait Pattern/deviations: Step-through pattern, Decreased step length - right, Decreased step length - left, Decreased stride length, Drifts right/left Gait velocity: decreased     General Gait Details: somewhat slow cadence with and without assistive device; drifting left/right and stepping strategy and min guard to regain balance without assistive device; more steady/stable with RW during ambulation; limited by fatigue; on room air throughout  Stairs    Wheelchair Mobility    Modified Rankin (Stroke Patients Only)       Balance Overall balance assessment: Needs assistance Sitting-balance support: No upper extremity supported, Feet supported Sitting balance-Leahy Scale: Good Sitting balance - Comments: sitting at EOB    Standing balance support: No upper extremity supported, During functional activity Standing balance-Leahy Scale: Fair Standing balance comment: fair/poor without assistive device; fair/good with RW         Pertinent Vitals/Pain Pain Assessment Pain Assessment: 0-10 Pain Score: 7  Pain Location: Left flank Pain Descriptors / Indicators: Dull Pain Intervention(s): Limited activity within patient's tolerance, Premedicated before session, Monitored during session    Home Living Family/patient expects to be discharged to:: Private residence Living Arrangements: Spouse/significant other Available Help at Discharge: Family;Available 24 hours/day Type of Home: House Home Access: Level entry       Home Layout: Able to live on main level with bedroom/bathroom;Two level Home Equipment: Cane - quad;Cane - single point;BSC/3in1;Shower seat      Prior Function Prior Level of Function : Driving;Independent/Modified Independent       Hand Dominance        Extremity/Trunk Assessment   Upper Extremity Assessment Upper Extremity Assessment: Defer to OT evaluation    Lower Extremity Assessment Lower Extremity Assessment: Overall WFL for tasks assessed    Cervical / Trunk Assessment Cervical / Trunk Assessment: Normal  Communication   Communication: HOH  Cognition Arousal/Alertness: Awake/alert Behavior During Therapy: WFL for tasks assessed/performed Overall Cognitive Status: Within Functional Limits for tasks assessed        General Comments      Exercises     Assessment/Plan    PT Assessment Patient does not need any further PT services  PT Problem List         PT Treatment Interventions      PT Goals (Current goals can be found in  the Care Plan section)       Frequency          AM-PAC PT "6 Clicks" Mobility  Outcome Measure Help needed turning from your back to your side while in a flat bed without using bedrails?: None Help needed moving from lying  on your back to sitting on the side of a flat bed without using bedrails?: None Help needed moving to and from a bed to a chair (including a wheelchair)?: A Little Help needed standing up from a chair using your arms (e.g., wheelchair or bedside chair)?: A Little Help needed to walk in hospital room?: A Little Help needed climbing 3-5 steps with a railing? : A Little 6 Click Score: 20    End of Session Equipment Utilized During Treatment: Gait belt Activity Tolerance: Patient tolerated treatment well;Patient limited by fatigue Patient left: in chair;with call bell/phone within reach;with family/visitor present Nurse Communication: Mobility status PT Visit Diagnosis: Unsteadiness on feet (R26.81);Other abnormalities of gait and mobility (R26.89)    Time: 3013-1438 PT Time Calculation (min) (ACUTE ONLY): 29 min   Charges:   PT Evaluation $PT Eval Low Complexity: 1 Low PT Treatments $Therapeutic Activity: 8-22 mins        Floria Raveling. Hartnett-Rands, MS, PT Per [place] [phone]  Pamala Hurry  Hartnett-Rands 04/27/2022, 2:04 PM

## 2022-04-27 NOTE — Discharge Instructions (Signed)
IMPORTANT INFORMATION: PAY CLOSE ATTENTION   PHYSICIAN DISCHARGE INSTRUCTIONS  Follow with Primary care provider  Earney Mallet, MD  and other consultants as instructed by your Hospitalist Physician  [place] IF SYMPTOMS COME BACK, WORSEN OR NEW PROBLEM DEVELOPS   Please note: You were cared for by a hospitalist during your hospital stay. Every effort will be made to forward records to your primary care provider.  You can request that your primary care provider send for your hospital records if they have not received them.  Once you are discharged, your primary care physician will handle any further medical issues. Please note that NO REFILLS for any discharge medications will be authorized once you are discharged, as it is imperative that you return to your primary care physician (or establish a relationship with a primary care physician if you do not have one) for your post hospital discharge needs so that they can reassess your need for medications and monitor your lab values.  Please get a complete blood count and chemistry panel checked by your Primary MD at your next visit, and again as instructed by your Primary MD.  Get Medicines reviewed and adjusted: Please take all your medications with you for your next visit with your Primary MD  Laboratory/radiological data: Please request your Primary MD to go over all hospital tests and procedure/radiological results at the follow up, please ask your primary care provider to get all Hospital records sent to his/her office.  In some cases, they will be blood work, cultures and biopsy results pending at the time of your discharge. Please request that your primary care provider follow up on these results.  If you are diabetic, please bring your blood sugar readings with you to your follow up appointment with primary care.    Please call and make your follow up appointments as soon as possible.    Also Note  the following: If you experience worsening of your admission symptoms, develop shortness of breath, life threatening emergency, suicidal or homicidal thoughts you must seek medical attention immediately by calling 911 or calling your MD immediately  if symptoms less severe.  You must read complete instructions/literature along with all the possible adverse reactions/side effects for all the Medicines you take and that have been prescribed to you. Take any new Medicines after you have completely understood and accpet all the possible adverse reactions/side effects.   Do not drive when taking Pain medications or sleeping medications (Benzodiazepines)  Do not take more than prescribed Pain, Sleep and Anxiety Medications. It is not advisable to combine anxiety,sleep and pain medications without talking with your primary care practitioner  Special Instructions: If you have smoked or chewed Tobacco  in the last 2 yrs please stop smoking, stop any regular Alcohol  and or any Recreational drug use.  Wear Seat belts while driving.  Do not drive if taking any narcotic, mind altering or controlled substances or recreational drugs or alcohol.

## 2022-04-27 NOTE — Progress Notes (Signed)
PROGRESS NOTE  Joshua Osborne PJK:[id number] DOB: October 14, 1940 DOA: 04/21/2022 PCP: Earney Mallet, MD  Brief History:  81 year old male with a history of depression, diabetes mellitus, urothelial carcinoma, hyperlipidemia presenting with 2-week history of generalized weakness.  In the past couple days, the patient has had fevers and chills.  He actually had shivering and rigors on the morning of 04/21/2022.  He has had poor oral intake.  His wife took his temperature on the morning of 04/21/2022 and it was 100.0 F.  He went to see his urologist, Dr. Lovena Neighbours, on 04/20/2022.  He was diagnosed with a UTI and started on cephalexin.  He took 2 doses prior to coming to the hospital.  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  He is scheduled tentatively for a laparoscopic left-sided nephroureterectomy on 05/30/2022.  He had 2 episodes of nausea and vomiting prior to coming to the hospital on the morning of admission.  He denies any abdominal pain but complains of left-sided flank pain which has worsened over the last couple days prior to admission.  He has been taking oxycodone 5 mg approximately twice daily since his renal biopsy.  He denies any headache, neck pain, chest pain, shortness of breath, cough, hemoptysis.  There is no diarrhea, hematochezia, melena, abdominal pain.  He has some intermittent dysuria. In the ED, the patient was febrile up to 101.7 F.  He was tachycardic into the 140s.  He was hemodynamically stable.  WBC 34.4, hemoglobin 8.4, platelets 266,000.  Sodium 133, potassium 4.5, bicarbonate 23, serum creatinine 1.76.  AST 32, ALT 47, ALP 157, total bilirubin 1.3.  UA shows greater than 50 WBC, 21-50 RBC.  Lactic acid 1.2.  CT of the abdomen and pelvis showed increased left hydronephrosis with hydroureter secondary to persistent soft tissue within the left renal collecting system consistent with tumor extending to the UPJ.   There is nonobstructive left renal calculi.  The patient was given the appropriate fluid boluses and started on ceftriaxone.  He was admitted for further evaluation and treatment.  04/26/2022: left ureteral stent placement by Dr. Alyson Ingles     Assessment and Plan: * Septic shock [facility]) Presented with fever, tachycardia, leukocytosis Required low dose levophed overnight 7/14-7/15>>weaned off Lactate peaked 1.2 UA>50 WBC Follow blood and urine cultures--negative to date but pt was on cephalexin prior to admission CXR--no infiltrates Antibiotics broaden to zosyn WBC up to 41.6>>32.2>>22.4>>21.0 Continue IVF>>saline lock on 7/17 due to fluid overload -afebrile for last 24 hours  Acute respiratory failure with hypoxia ([place]) 7/17 developed hypoxia and tachypnea -stable on 2-3L -due to pulm edema -personally reviewed 7/17 CXR--pulm edema, vasc congestion -saline lock IVF -lasix IV 40 mg x 1 on 7/17 -Echo: LVEF 60-65%   Hypomagnesemia repleted  AKI (acute kidney injury) ([place]) Multifactorial including sepsis/hypotension, obstruction, hypovolemia -baseline creatiint 0.9-1.0 -serum creatinine peaked 1.76>>1.60>>1.66>>1.72 -overall slowly improving -continue IVF>>saline lock as pt is not fluid overloaed -urology consulted>>discussed with Dr. McKenzie>>s/p left ureteral stent 7/19  Cancer related pain Left flank pain not well controlled D/c oxycodone continue dilaudid prn Changed back to po dilaudid after discussion with Dr. Alyson Ingles 7/17  Mixed hyperlipidemia Holding statin temporarily  Controlled type 2 diabetes mellitus without complication, without long-term current use of insulin (HCC) Hold metformin 04/21/22 A1C--5.9 novolog sliding scale CBG (last 3)  Recent Labs    04/26/22 0645 04/26/22 0842 04/26/22 1145  GLUCAP 150* 142* 203*  Acute pyelonephritis WBC up 41.6,>>32.2>22.4>>21.0 --fever curve is improving>>spiked fever 7/17 around 1745 --pt clinically  stable --broaden abx pending culture data>>zosyn started 7/15 --urine culture is neg but pt was on cephalexin prior to admission -04/24/22--repeat blood and urine culture--respiked fever 103.1 -urology consulted>>discussed with Dr. 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  No hypermetabolic lymph nodes.  No liver lesions hypermetabolic. -He underwent ureteroscopy and biopsy on 03/27/2022. 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  Muscularis propria not identified in the current specimen --laparoscopic left nephroureterectomy and TURBT tentatively planned 05/30/22  Transitional cell carcinoma of renal pelvis, left (HCC) -PET scan (03/16/2022): Soft tissue mass within the left renal pelvis, SUV 6.7.  No hypermetabolic lymph nodes.  No liver lesions hypermetabolic. -He underwent ureteroscopy and biopsy on 03/27/2022. 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  Muscularis propria not identified in the current specimen --laparoscopic left nephroureterectomy tentatively planned 05/30/22 --appreciate Dr. Alyson Ingles consult  Symptomatic anemia Hgb dropped to 6.9 Likely dilution Transfused one unit PRBC 7/15 -transfused one unit PRBC 7/17 -recheck CBC in AM   Leukocytosis -WBC trending down   Family Communication:   Family updated at bedside 7/19  Consultants:  urology  Code Status:  FULL   DVT Prophylaxis:  [place] Lovenox   Procedures: As Listed in Progress Note Above  Antibiotics: Ceftriaxone 7/14>>7/15 Zosyn 7/15>>  Subjective: Patient seen right after back up to floor from stent placement, no complaints at this time.    Objective: Vitals:   04/26/22 1300 04/26/22 2045 04/27/22 0040 04/27/22 0419  BP: (!) 148/69 136/75 129/73 132/69  Pulse: 85 80 76 70  Resp: '16 20 18 18  '$ Temp: 98.3 F (36.8 C) 98.1 F (36.7  C) 98.2 F (36.8 C) 97.8 F (36.6 C)  TempSrc: Oral Oral Oral   SpO2: 96% 97% 96% 94%  Weight:      Height:        Intake/Output Summary (Last 24 hours) at 04/27/2022 0750 Last data filed at 04/27/2022 0700 Gross per 24 hour  Intake 1734.91 ml  Output 3125 ml  Net -1390.09 ml   Weight change:  Exam:  General:  Pt is alert, follows commands appropriately, not in acute distress HEENT: No icterus, No thrush, No neck mass, [place]/AT Cardiovascular: RRR, S1/S2, no rubs, no gallops Respiratory: CTA bilaterally, no wheezing, no crackles, no rhonchi Abdomen: Soft/+BS, non tender, non distended, no guarding; left costovertebral tenderness Extremities: No edema, No lymphangitis, No petechiae, No rashes, no synovitis  Data Reviewed: I have personally reviewed following labs and imaging studies Basic Metabolic Panel: Recent Labs  Lab 04/23/22 0413 04/24/22 0352 04/25/22 0408 04/25/22 1415 04/26/22 0416 04/27/22 0517  NA 135 136 135 135  --  136  K 3.7 3.7 4.3 4.2  --  4.0  CL 102 105 99 98  --  100  CO2 '23 23 25 27  '$ --  27  GLUCOSE 167* 160* 148* 166*  --  163*  BUN '21 21 21 19  '$ --  23  CREATININE 1.60* 1.66* 1.72* 1.61*  --  1.39*  CALCIUM 8.3* 8.1* 8.4* 8.5*  --  8.7*  MG 1.4* 1.8 1.7  --  2.0 2.1   Liver Function Tests: Recent Labs  Lab 04/22/22 0350 04/23/22 0413 04/25/22 0408 04/25/22 1415 04/27/22 0517  AST 30 33 67* 57* 24  ALT 39 42 86* 80* 52*  ALKPHOS 134*  150* 253* 246* 192*  BILITOT 1.1 1.2 1.7* 1.3* 0.6  PROT 6.4* 6.4* 6.8 7.0 6.4*  ALBUMIN 3.0* 2.9* 2.9* 2.9* 2.7*   No results for input(s): "LIPASE", "AMYLASE" in the last 168 hours. No results for input(s): "AMMONIA" in the last 168 hours. Coagulation Profile: Recent Labs  Lab 04/21/22 1114  INR 1.2   CBC: Recent Labs  Lab 04/21/22 1114 04/22/22 0954 04/23/22 0413 04/24/22 0352 04/24/22 0626 04/24/22 1642 04/25/22 0408 04/26/22 0416 04/27/22 0517  WBC 34.4*   < > 32.2* 22.4*  --   --   21.0* 20.6* 19.4*  NEUTROABS 31.1*  --  28.8* 19.5*  --   --   --   --   --   HGB 8.4*   < > 7.6* 6.8* 7.1* 9.4* 9.1* 8.6* 8.6*  HCT 26.2*   < > 23.8* 20.7* 21.6* 28.3* 28.1* 26.3* 26.6*  MCV 64.2*   < > 66.5* 65.3*  --   --  67.9* 67.4* 68.4*  PLT 366   < > 316 316  --   --  385 424* 473*   < > = values in this interval not displayed.   Cardiac Enzymes: No results for input(s): "CKTOTAL", "CKMB", "CKMBINDEX", "TROPONINI" in the last 168 hours. BNP: Invalid input(s): "POCBNP" CBG: Recent Labs  Lab 04/26/22 0645 04/26/22 0842 04/26/22 1145 04/26/22 1618 04/26/22 2042  GLUCAP 150* 142* 203* 244* 234*   HbA1C: No results for input(s): "HGBA1C" in the last 72 hours. Urine analysis:    Component Value Date/Time   COLORURINE STRAW (A) 04/24/2022 1935   APPEARANCEUR CLEAR 04/24/2022 1935   LABSPEC 1.006 04/24/2022 1935   PHURINE 6.0 04/24/2022 1935   GLUCOSEU NEGATIVE 04/24/2022 1935   HGBUR SMALL (A) 04/24/2022 [place] NEGATIVE 04/24/2022 [place] NEGATIVE 04/24/2022 1935   PROTEINUR NEGATIVE 04/24/2022 1935   NITRITE NEGATIVE 04/24/2022 1935   LEUKOCYTESUR NEGATIVE 04/24/2022 1935   Sepsis Labs: '@LABRCNTIP'$ (procalcitonin:4,lacticidven:4) ) Recent Results (from the past 240 hour(s))  Culture, blood (Routine x 2)     Status: None (Preliminary result)   Collection Time: 04/21/22 11:17 AM   Specimen: BLOOD RIGHT ARM  Result Value Ref Range Status   Specimen Description   Final    BLOOD RIGHT ARM BOTTLES DRAWN AEROBIC AND ANAEROBIC   Special Requests   Final    Blood Culture results may not be optimal due to an excessive volume of blood received in culture bottles   Culture   Final    NO GROWTH 4 DAYS Performed at [facility], [address] [place], [place] [address]    Report Status PENDING  Incomplete  Culture, blood (Routine x 2)     Status: None (Preliminary result)   Collection Time: 04/21/22 11:17 AM   Specimen: BLOOD LEFT ARM  Result Value  Ref Range Status   Specimen Description BLOOD LEFT ARM BOTTLES DRAWN AEROBIC AND ANAEROBIC  Final   Special Requests Blood Culture adequate volume  Final   Culture   Final    NO GROWTH 4 DAYS Performed at [facility], [address] [place], [place] [address]    Report Status PENDING  Incomplete  Urine Culture     Status: None   Collection Time: 04/21/22 11:49 AM   Specimen: Urine, Clean Catch  Result Value Ref Range Status   Specimen Description   Final    URINE, CLEAN CATCH Performed at [facility], [address] [place], [place] [address]  Special Requests   Final    NONE Performed at [facility], [address], [place], [place] [address]    Culture   Final    NO GROWTH Performed at [facility], [place] [address] [place], [place] [address]    Report Status 04/23/2022 FINAL  Final  MRSA Next Gen by PCR, Nasal     Status: None   Collection Time: 04/21/22  3:39 PM   Specimen: Nasal Mucosa; Nasal Swab  Result Value Ref Range Status   MRSA by PCR Next Gen NOT DETECTED NOT DETECTED Final    Comment: (NOTE) The GeneXpert MRSA Assay (FDA approved for NASAL specimens only), is one component of a comprehensive MRSA colonization surveillance program. It is not intended to diagnose MRSA infection nor to guide or monitor treatment for MRSA infections. Test performance is not FDA approved in patients less than 43 years old. Performed at [facility], [address] [place], [place] [address]   Culture, blood (Routine X 2) w Reflex to ID Panel     Status: None (Preliminary result)   Collection Time: 04/24/22  6:38 PM   Specimen: Right Antecubital; Blood  Result Value Ref Range Status   Specimen Description RIGHT ANTECUBITAL  Final   Special Requests   Final    BOTTLES DRAWN AEROBIC AND ANAEROBIC Blood Culture adequate volume   Culture   Final    NO GROWTH < 12 HOURS Performed at [facility], [address] [place], [place] [address]    Report Status PENDING   Incomplete  Culture, blood (Routine X 2) w Reflex to ID Panel     Status: None (Preliminary result)   Collection Time: 04/24/22  6:38 PM   Specimen: Left Antecubital; Blood  Result Value Ref Range Status   Specimen Description LEFT ANTECUBITAL  Final   Special Requests   Final    BOTTLES DRAWN AEROBIC AND ANAEROBIC Blood Culture adequate volume   Culture   Final    NO GROWTH < 12 HOURS Performed at [facility], [address] [place], [place] [address]    Report Status PENDING  Incomplete  Urine Culture     Status: None   Collection Time: 04/24/22  7:35 PM   Specimen: Urine, Clean Catch  Result Value Ref Range Status   Specimen Description   Final    URINE, CLEAN CATCH Performed at [facility], [address], [place], [place] [address]    Special Requests   Final    NONE Performed at [facility], [address], [place], [place] [address]    Culture   Final    NO GROWTH Performed at [facility], [place] [address] [place],  [address]    Report Status 04/26/2022 FINAL  Final     Scheduled Meds:  Chlorhexidine Gluconate Cloth  6 each Topical Daily   citalopram  10 mg Oral Daily   enoxaparin (LOVENOX) injection  40 mg Subcutaneous Q24H   feeding supplement  237 mL Oral TID BM   insulin aspart  0-9 Units Subcutaneous TID WC   latanoprost  1 drop Both Eyes QHS   multivitamin with minerals  1 tablet Oral Daily   niacin  500 mg Oral QHS   pantoprazole  40 mg Oral Daily   tamsulosin  0.4 mg Oral Daily   tiZANidine  2 mg Oral QHS   Continuous Infusions:  sodium chloride     piperacillin-tazobactam (ZOSYN)  IV 3.375 g (04/27/22 0550)  Procedures/Studies: DG C-Arm 1-60 Min-No Report  Result Date: 04/26/2022 Fluoroscopy was utilized by the requesting physician.  No radiographic interpretation.   ECHOCARDIOGRAM COMPLETE  Result Date: 04/25/2022    ECHOCARDIOGRAM REPORT   Patient Name:   Joshua Osborne Date of Exam: 04/25/2022 Medical Rec #:  [id number]         Height:       72.0 in Accession #:    [id number]       Weight:       182.3 lb Date of Birth:  1941/04/21         BSA:          2.048 m Patient Age:    55 years         BP:           161/64 mmHg Patient Gender: M                HR:           79 bpm. Exam Location:  [facility] Procedure: 2D Echo, Cardiac Doppler and Color Doppler Indications:    CHF  History:        Patient has no prior history of Echocardiogram examinations.                 Risk Factors:Diabetes and Dyslipidemia. Bladder CA.  Sonographer:    Wenda Low Referring Phys: [phone] DAVID TAT IMPRESSIONS  1. Left ventricular ejection fraction, by estimation, is 60 to 65%. The left ventricle has normal function. The left ventricle has no regional wall motion abnormalities. There is mild left ventricular hypertrophy. Left ventricular diastolic parameters were normal.  2. Right ventricular systolic function is normal. The right ventricular size is normal. There is normal pulmonary artery systolic pressure.  3. The mitral valve is normal in structure. Trivial mitral valve regurgitation. No evidence of mitral stenosis.  4. The tricuspid valve is abnormal.  5. The aortic valve is tricuspid. There is mild calcification of the aortic valve. There is mild thickening of the aortic valve. Aortic valve regurgitation is not visualized. No aortic stenosis is present.  6. The inferior vena cava is normal in size with greater than 50% respiratory variability, suggesting right atrial pressure of 3 mmHg. FINDINGS  Left Ventricle: Left ventricular ejection fraction, by estimation, is 60 to 65%. The left ventricle has normal function. The left ventricle has no regional wall motion abnormalities. The left ventricular internal cavity size was normal in size. There is  mild left ventricular hypertrophy. Left ventricular diastolic parameters were normal. Right Ventricle: The right ventricular size is normal. Right vetricular wall thickness was not well visualized. Right  ventricular systolic function is normal. There is normal pulmonary artery systolic pressure. The tricuspid regurgitant velocity is 2.70 m/s, and with an assumed right atrial pressure of 3 mmHg, the estimated right ventricular systolic pressure is 02.7 mmHg. Left Atrium: Left atrial size was normal in size. Right Atrium: Right atrial size was normal in size. Pericardium: There is no evidence of pericardial effusion. Mitral Valve: The mitral valve is normal in structure. Trivial mitral valve regurgitation. No evidence of mitral valve stenosis. MV peak gradient, 5.2 mmHg. The mean mitral valve gradient is 2.0 mmHg. Tricuspid Valve: The tricuspid valve is abnormal. Tricuspid valve regurgitation is mild . No evidence of tricuspid stenosis. Aortic Valve: The aortic valve is tricuspid. There is mild calcification of the aortic valve. There is mild thickening of the aortic valve. There is mild aortic valve annular calcification. Aortic  valve regurgitation is not visualized. No aortic stenosis  is present. Aortic valve mean gradient measures 3.0 mmHg. Aortic valve peak gradient measures 5.7 mmHg. Aortic valve area, by VTI measures 2.21 cm. Pulmonic Valve: The pulmonic valve was not well visualized. Pulmonic valve regurgitation is mild. No evidence of pulmonic stenosis. Aorta: The aortic root is normal in size and structure. Venous: The inferior vena cava is normal in size with greater than 50% respiratory variability, suggesting right atrial pressure of 3 mmHg. IAS/Shunts: No atrial level shunt detected by color flow Doppler.  LEFT VENTRICLE PLAX 2D LVIDd:         4.50 cm     Diastology LVIDs:         3.20 cm     LV e' medial:    8.05 cm/s LV PW:         1.30 cm     LV E/e' medial:  12.4 LV IVS:        1.20 cm     LV e' lateral:   10.80 cm/s LVOT diam:     1.90 cm     LV E/e' lateral: 9.3 LV SV:         62 LV SV Index:   30 LVOT Area:     2.84 cm  LV Volumes (MOD) LV vol d, MOD A2C: 62.4 ml LV vol d, MOD A4C: 63.7 ml LV  vol s, MOD A2C: 25.0 ml LV vol s, MOD A4C: 27.0 ml LV SV MOD A2C:     37.4 ml LV SV MOD A4C:     63.7 ml LV SV MOD BP:      39.2 ml RIGHT VENTRICLE RV Basal diam:  3.65 cm RV Mid diam:    3.50 cm RV S prime:     14.10 cm/s TAPSE (M-mode): 2.1 cm LEFT ATRIUM             Index        RIGHT ATRIUM           Index LA diam:        4.30 cm 2.10 cm/m   RA Area:     17.30 cm LA Vol (A2C):   48.4 ml 23.63 ml/m  RA Volume:   44.30 ml  21.63 ml/m LA Vol (A4C):   45.3 ml 22.12 ml/m LA Biplane Vol: 47.9 ml 23.38 ml/m  AORTIC VALVE                    PULMONIC VALVE AV Area (Vmax):    2.36 cm     PV Vmax:       0.97 m/s AV Area (Vmean):   2.13 cm     PV Peak grad:  3.8 mmHg AV Area (VTI):     2.21 cm AV Vmax:           119.00 cm/s AV Vmean:          82.300 cm/s AV VTI:            0.281 m AV Peak Grad:      5.7 mmHg AV Mean Grad:      3.0 mmHg LVOT Vmax:         99.20 cm/s LVOT Vmean:        61.800 cm/s LVOT VTI:          0.219 m LVOT/AV VTI ratio: 0.78  AORTA Ao Root diam: 3.50 cm Ao Asc diam:  3.80 cm MITRAL VALVE  TRICUSPID VALVE MV Area (PHT): 4.26 cm     TR Peak grad:   29.2 mmHg MV Area VTI:   1.66 cm     TR Vmax:        270.00 cm/s MV Peak grad:  5.2 mmHg MV Mean grad:  2.0 mmHg     SHUNTS MV Vmax:       1.14 m/s     Systemic VTI:  0.22 m MV Vmean:      69.2 cm/s    Systemic Diam: 1.90 cm MV Decel Time: 178 msec MV E velocity: 100.00 cm/s MV A velocity: 107.00 cm/s MV E/A ratio:  0.93 Carlyle Dolly MD Electronically signed by Carlyle Dolly MD Signature Date/Time: 04/25/2022/4:06:13 PM    Final    CT ABDOMEN PELVIS WO CONTRAST  Result Date: 04/25/2022 CLINICAL DATA:  Left flank and abdominal pain generalized weakness, fever, chills, transitional cell carcinoma * Tracking Code: BO * EXAM: CT ABDOMEN AND PELVIS WITHOUT CONTRAST TECHNIQUE: Multidetector CT imaging of the abdomen and pelvis was performed following the standard protocol without IV contrast. Oral enteric contrast was administered.  RADIATION DOSE REDUCTION: This exam was performed according to the departmental dose-optimization program which includes automated exposure control, adjustment of the mA and/or kV according to patient size and/or use of iterative reconstruction technique. COMPARISON:  04/21/2022 FINDINGS: Lower chest: No acute abnormality. Coronary artery calcifications. Mild fibrotic change in the included bilateral lung bases. Trace bilateral pleural effusions. Hepatobiliary: Calcified lesions of the right lobe of the liver, unchanged, incompletely characterized by noncontrast CT (series 2, image 17). Status post cholecystectomy. No biliary dilatation. Pancreas: Unremarkable. No pancreatic ductal dilatation or surrounding inflammatory changes. Spleen: Normal in size without significant abnormality. Adrenals/Urinary Tract: Adrenal glands are unremarkable. Small nonobstructive calculi of the left kidney. Unchanged severe left hydronephrosis with soft tissue present in the left renal pelvis, poorly evaluated by noncontrast CT (series 2, image 34). Bladder is unremarkable. Stomach/Bowel: Stomach is within normal limits. Appendix appears normal. No evidence of bowel wall thickening, distention, or inflammatory changes. Descending and sigmoid diverticulosis. Vascular/Lymphatic: Aortic atherosclerosis. No enlarged abdominal or pelvic lymph nodes. Reproductive: Severe prostatomegaly. Other: No abdominal wall hernia or abnormality. No ascites. Musculoskeletal: No acute or significant osseous findings. IMPRESSION: 1. Unchanged severe left hydronephrosis with soft tissue present in the left renal pelvis, poorly evaluated by noncontrast CT. 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 2. Small nonobstructive calculi of the left kidney. 3. Severe prostatomegaly. 4. Descending and sigmoid diverticulosis without evidence of acute diverticulitis. 5. Trace bilateral pleural effusions. 6. Coronary artery disease. Aortic Atherosclerosis  (ICD10-I70.0). Electronically Signed   By: Delanna Ahmadi M.D.   On: 04/25/2022 16:03   DG CHEST PORT 1 VIEW  Result Date: 04/24/2022 CLINICAL DATA:  Fever, chills and shortness of breath EXAM: PORTABLE CHEST 1 VIEW COMPARISON:  04/21/2022 FINDINGS: Heart size remains normal. Mediastinal shadows are unremarkable except for mild atherosclerosis of the aorta. There is new abnormal bilateral pulmonary density in a pattern most consistent with pulmonary venous hypertension/fluid overload. Widespread developing pneumonia could have a similar appearance. IMPRESSION: Suspicion of congestive heart failure with developing edema. Developing widespread pneumonia could have a similar appearance, but is less likely. Electronically Signed   By: Nelson Chimes M.D.   On: 04/24/2022 18:53   CT ABDOMEN PELVIS W CONTRAST  Result Date: 04/21/2022 CLINICAL DATA:  LEFT flank pain and LEFT lower quadrant pain, fever, and chills for the past few days, suspected kidney stone; history of renal cell  carcinoma, diverticulitis, pyelonephritis EXAM: CT ABDOMEN AND PELVIS WITH CONTRAST TECHNIQUE: Multidetector CT imaging of the abdomen and pelvis was performed using the standard protocol following bolus administration of intravenous contrast. RADIATION DOSE REDUCTION: This exam was performed according to the departmental dose-optimization program which includes automated exposure control, adjustment of the mA and/or kV according to patient size and/or use of iterative reconstruction technique. CONTRAST:  74m OMNIPAQUE IOHEXOL 300 MG/ML SOLN IV. No oral contrast. COMPARISON:  03/07/2022 Correlation: PET-CT 03/16/2022 FINDINGS: Lower chest: Lung bases clear Hepatobiliary: Calcifications with surrounding low attenuation in the RIGHT lobe of the liver question treated metastases. Post cholecystectomy. No new hepatic abnormalities. Pancreas: Atrophic pancreas without mass Spleen: Normal appearance Adrenals/Urinary Tract: Adrenal glands and  RIGHT kidney normal appearance. Again identified soft tissue within LEFT renal collecting system consistent with known tumor, extending to the reader 0 pelvic junction and causing increased LEFT hydronephrosis. Nonobstructing LEFT renal calculi. LEFT ureter is dilated in its course to the urinary bladder without calcification or mass. RIGHT ureter decompressed. Bladder unremarkable. Stomach/Bowel: Normal appendix. Diverticulosis of descending and sigmoid colon without evidence of diverticulitis. Stomach and remaining bowel loops unremarkable. Vascular/Lymphatic: Atherosclerotic calcifications aorta and iliac arteries without aneurysm. Vascular structures patent. No adenopathy. Reproductive: Significant prostatic enlargement, gland measuring 7.2 x 6.0 x 5.7 cm (volume = 130 cm^3). Seminal vesicles unremarkable. Other: No free air or free fluid. No hernia or inflammatory process. 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 Significant prostatic enlargement. Distal colonic diverticulosis without evidence of diverticulitis. 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 Aortic Atherosclerosis (ICD10-I70.0). Electronically Signed   By: MLavonia DanaM.D.   On: 04/21/2022 12:49   DG Chest 2 View  Result Date: 04/21/2022 CLINICAL DATA:  Suspected sepsis EXAM: CHEST - 2 VIEW COMPARISON:  None Available. FINDINGS: The heart size and mediastinal contours are within normal limits. Both lungs are clear. The visualized skeletal structures are unremarkable. IMPRESSION: No active cardiopulmonary disease. Electronically Signed   By: DDorise BullionIII M.D.   On: 04/21/2022 11:30    CIrwin Brakeman MD  [facility]  If 7PM-7AM, please contact  night-coverage [email or web address] Password [email or web address]    LOS: 6 days

## 2022-04-27 NOTE — Progress Notes (Signed)
1 Day Post-Op Subjective: Patient reports improved left flank pain. No fevers overnight. Urine dark red but clearing  Objective: Vital signs in last 24 hours: Temp:  [97.8 F (36.6 C)-98.3 F (36.8 C)] 97.8 F (36.6 C) (07/20 0419) Pulse Rate:  [70-87] 70 (07/20 0419) Resp:  [12-20] 18 (07/20 0419) BP: (129-155)/(55-75) 132/69 (07/20 0419) SpO2:  [94 %-97 %] 94 % (07/20 0419)  Intake/Output from previous day: 07/19 0701 - 07/20 0700 In: 1734.9 [P.O.:720; I.V.:800; IV Piggyback:214.9] Out: 3125 [Urine:3125] Intake/Output this shift: Total I/O In: 480 [P.O.:480] Out: -   Physical Exam:  General:alert, cooperative, and appears stated age GI: soft, non tender, normal bowel sounds, no palpable masses, no organomegaly, no inguinal hernia Male genitalia: not done Extremities: extremities normal, atraumatic, no cyanosis or edema  Lab Results: Recent Labs    04/25/22 0408 04/26/22 0416 04/27/22 0517  HGB 9.1* 8.6* 8.6*  HCT 28.1* 26.3* 26.6*   BMET Recent Labs    04/25/22 1415 04/27/22 0517  NA 135 136  K 4.2 4.0  CL 98 100  CO2 27 27  GLUCOSE 166* 163*  BUN 19 23  CREATININE 1.61* 1.39*  CALCIUM 8.5* 8.7*   No results for input(s): "LABPT", "INR" in the last 72 hours. No results for input(s): "LABURIN" in the last 72 hours. Results for orders placed or performed during the hospital encounter of 04/21/22  Culture, blood (Routine x 2)     Status: None   Collection Time: 04/21/22 11:17 AM   Specimen: BLOOD RIGHT ARM  Result Value Ref Range Status   Specimen Description   Final    BLOOD RIGHT ARM BOTTLES DRAWN AEROBIC AND ANAEROBIC   Special Requests   Final    Blood Culture results may not be optimal due to an excessive volume of blood received in culture bottles   Culture   Final    NO GROWTH 6 DAYS Performed at [facility], [address], [place], [place] [address]    Report Status 04/27/2022 FINAL  Final  Culture, blood (Routine x 2)     Status: None    Collection Time: 04/21/22 11:17 AM   Specimen: BLOOD LEFT ARM  Result Value Ref Range Status   Specimen Description BLOOD LEFT ARM BOTTLES DRAWN AEROBIC AND ANAEROBIC  Final   Special Requests Blood Culture adequate volume  Final   Culture   Final    NO GROWTH 6 DAYS Performed at [facility], [address] [place], [place] [address]    Report Status 04/27/2022 FINAL  Final  Urine Culture     Status: None   Collection Time: 04/21/22 11:49 AM   Specimen: Urine, Clean Catch  Result Value Ref Range Status   Specimen Description   Final    URINE, CLEAN CATCH Performed at [facility], [address] [place], [place] [address]    Special Requests   Final    NONE Performed at [facility], [address] [place], [place] [address]    Culture   Final    NO GROWTH Performed at [facility], [place] [address], [place], [place] [address]    Report Status 04/23/2022 FINAL  Final  MRSA Next Gen by PCR, Nasal     Status: None   Collection Time: 04/21/22  3:39 PM   Specimen: Nasal Mucosa; Nasal Swab  Result Value Ref Range Status   MRSA by PCR Next Gen NOT DETECTED NOT DETECTED Final    Comment: (NOTE) The GeneXpert MRSA  Assay (FDA approved for NASAL specimens only), is one component of a comprehensive MRSA colonization surveillance program. It is not intended to diagnose MRSA infection nor to guide or monitor treatment for MRSA infections. Test performance is not FDA approved in patients less than 66 years old. Performed at [facility], [address] [place], [place] [address]   Culture, blood (Routine X 2) w Reflex to ID Panel     Status: None (Preliminary result)   Collection Time: 04/24/22  6:38 PM   Specimen: Right Antecubital; Blood  Result Value Ref Range Status   Specimen Description RIGHT ANTECUBITAL  Final   Special Requests   Final    BOTTLES DRAWN AEROBIC AND ANAEROBIC Blood Culture adequate volume   Culture   Final    NO GROWTH 3 DAYS Performed at [facility], [address], [place], [place] [address]    Report Status PENDING  Incomplete  Culture, blood (Routine X 2) w Reflex to ID Panel     Status: None (Preliminary result)   Collection Time: 04/24/22  6:38 PM   Specimen: Left Antecubital; Blood  Result Value Ref Range Status   Specimen Description LEFT ANTECUBITAL  Final   Special Requests   Final    BOTTLES DRAWN AEROBIC AND ANAEROBIC Blood Culture adequate volume   Culture   Final    NO GROWTH 3 DAYS Performed at [facility], [address], [place], [place] [address]    Report Status PENDING  Incomplete  Urine Culture     Status: None   Collection Time: 04/24/22  7:35 PM   Specimen: Urine, Clean Catch  Result Value Ref Range Status   Specimen Description   Final    URINE, CLEAN CATCH Performed at [facility], [address] [place], [place] [address]    Special Requests   Final    NONE Performed at [facility], [address] [place], [place] [address]    Culture   Final    NO GROWTH Performed at [facility], [place] [address], [place],  [address]    Report Status 04/26/2022 FINAL  Final    Studies/Results: DG CHEST PORT 1 VIEW  Result Date: 04/27/2022 CLINICAL DATA:  81 year old male with 2 week history of generalized weakness admitted with septic shock EXAM: PORTABLE CHEST 1 VIEW COMPARISON:  Radiographs 04/24/2022 FINDINGS: Since 04/24/2022, improved lung volumes and slightly decreased pulmonary vascular congestion. Cardiomediastinal silhouette at the upper limits of normal. Pulmonary vascular congestion. Bibasilar atelectasis/consolidation. Probable small left pleural effusion. Aortic calcifications. IMPRESSION: Findings favored to represent improving pulmonary edema with infection less likely though not excluded. Electronically Signed   By: Placido Sou M.D.   On: 04/27/2022 08:26   DG C-Arm 1-60 Min-No Report  Result Date: 04/26/2022 Fluoroscopy was utilized by the requesting physician.  No  radiographic interpretation.   ECHOCARDIOGRAM COMPLETE  Result Date: 04/25/2022    ECHOCARDIOGRAM REPORT   Patient Name:   Joshua Osborne Date of Exam: 04/25/2022 Medical Rec #:  [id number]        Height:       72.0 in Accession #:    [id number]       Weight:       182.3 lb Date of Birth:  21-Dec-1940         BSA:          2.048 m Patient Age:    81 years         BP:  161/64 mmHg Patient Gender: M                HR:           79 bpm. Exam Location:  [facility] Procedure: 2D Echo, Cardiac Doppler and Color Doppler Indications:    CHF  History:        Patient has no prior history of Echocardiogram examinations.                 Risk Factors:Diabetes and Dyslipidemia. Bladder CA.  Sonographer:    Wenda Low Referring Phys: [phone] DAVID TAT IMPRESSIONS  1. Left ventricular ejection fraction, by estimation, is 60 to 65%. The left ventricle has normal function. The left ventricle has no regional wall motion abnormalities. There is mild left ventricular hypertrophy. Left ventricular diastolic parameters were normal.  2. Right ventricular systolic function is normal. The right ventricular size is normal. There is normal pulmonary artery systolic pressure.  3. The mitral valve is normal in structure. Trivial mitral valve regurgitation. No evidence of mitral stenosis.  4. The tricuspid valve is abnormal.  5. The aortic valve is tricuspid. There is mild calcification of the aortic valve. There is mild thickening of the aortic valve. Aortic valve regurgitation is not visualized. No aortic stenosis is present.  6. The inferior vena cava is normal in size with greater than 50% respiratory variability, suggesting right atrial pressure of 3 mmHg. FINDINGS  Left Ventricle: Left ventricular ejection fraction, by estimation, is 60 to 65%. The left ventricle has normal function. The left ventricle has no regional wall motion abnormalities. The left ventricular internal cavity size was normal in size. There is  mild left  ventricular hypertrophy. Left ventricular diastolic parameters were normal. Right Ventricle: The right ventricular size is normal. Right vetricular wall thickness was not well visualized. Right ventricular systolic function is normal. There is normal pulmonary artery systolic pressure. The tricuspid regurgitant velocity is 2.70 m/s, and with an assumed right atrial pressure of 3 mmHg, the estimated right ventricular systolic pressure is 85.6 mmHg. Left Atrium: Left atrial size was normal in size. Right Atrium: Right atrial size was normal in size. Pericardium: There is no evidence of pericardial effusion. Mitral Valve: The mitral valve is normal in structure. Trivial mitral valve regurgitation. No evidence of mitral valve stenosis. MV peak gradient, 5.2 mmHg. The mean mitral valve gradient is 2.0 mmHg. Tricuspid Valve: The tricuspid valve is abnormal. Tricuspid valve regurgitation is mild . No evidence of tricuspid stenosis. Aortic Valve: The aortic valve is tricuspid. There is mild calcification of the aortic valve. There is mild thickening of the aortic valve. There is mild aortic valve annular calcification. Aortic valve regurgitation is not visualized. No aortic stenosis  is present. Aortic valve mean gradient measures 3.0 mmHg. Aortic valve peak gradient measures 5.7 mmHg. Aortic valve area, by VTI measures 2.21 cm. Pulmonic Valve: The pulmonic valve was not well visualized. Pulmonic valve regurgitation is mild. No evidence of pulmonic stenosis. Aorta: The aortic root is normal in size and structure. Venous: The inferior vena cava is normal in size with greater than 50% respiratory variability, suggesting right atrial pressure of 3 mmHg. IAS/Shunts: No atrial level shunt detected by color flow Doppler.  LEFT VENTRICLE PLAX 2D LVIDd:         4.50 cm     Diastology LVIDs:         3.20 cm     LV e' medial:    8.05 cm/s LV PW:  1.30 cm     LV E/e' medial:  12.4 LV IVS:        1.20 cm     LV e' lateral:    10.80 cm/s LVOT diam:     1.90 cm     LV E/e' lateral: 9.3 LV SV:         62 LV SV Index:   30 LVOT Area:     2.84 cm  LV Volumes (MOD) LV vol d, MOD A2C: 62.4 ml LV vol d, MOD A4C: 63.7 ml LV vol s, MOD A2C: 25.0 ml LV vol s, MOD A4C: 27.0 ml LV SV MOD A2C:     37.4 ml LV SV MOD A4C:     63.7 ml LV SV MOD BP:      39.2 ml RIGHT VENTRICLE RV Basal diam:  3.65 cm RV Mid diam:    3.50 cm RV S prime:     14.10 cm/s TAPSE (M-mode): 2.1 cm LEFT ATRIUM             Index        RIGHT ATRIUM           Index LA diam:        4.30 cm 2.10 cm/m   RA Area:     17.30 cm LA Vol (A2C):   48.4 ml 23.63 ml/m  RA Volume:   44.30 ml  21.63 ml/m LA Vol (A4C):   45.3 ml 22.12 ml/m LA Biplane Vol: 47.9 ml 23.38 ml/m  AORTIC VALVE                    PULMONIC VALVE AV Area (Vmax):    2.36 cm     PV Vmax:       0.97 m/s AV Area (Vmean):   2.13 cm     PV Peak grad:  3.8 mmHg AV Area (VTI):     2.21 cm AV Vmax:           119.00 cm/s AV Vmean:          82.300 cm/s AV VTI:            0.281 m AV Peak Grad:      5.7 mmHg AV Mean Grad:      3.0 mmHg LVOT Vmax:         99.20 cm/s LVOT Vmean:        61.800 cm/s LVOT VTI:          0.219 m LVOT/AV VTI ratio: 0.78  AORTA Ao Root diam: 3.50 cm Ao Asc diam:  3.80 cm MITRAL VALVE                TRICUSPID VALVE MV Area (PHT): 4.26 cm     TR Peak grad:   29.2 mmHg MV Area VTI:   1.66 cm     TR Vmax:        270.00 cm/s MV Peak grad:  5.2 mmHg MV Mean grad:  2.0 mmHg     SHUNTS MV Vmax:       1.14 m/s     Systemic VTI:  0.22 m MV Vmean:      69.2 cm/s    Systemic Diam: 1.90 cm MV Decel Time: 178 msec MV E velocity: 100.00 cm/s MV A velocity: 107.00 cm/s MV E/A ratio:  0.93 Carlyle Dolly MD Electronically signed by Carlyle Dolly MD Signature Date/Time: 04/25/2022/4:06:13 PM    Final    CT ABDOMEN PELVIS WO  CONTRAST  Result Date: 04/25/2022 CLINICAL DATA:  Left flank and abdominal pain generalized weakness, fever, chills, transitional cell carcinoma * Tracking Code: BO * EXAM: CT ABDOMEN AND  PELVIS WITHOUT CONTRAST TECHNIQUE: Multidetector CT imaging of the abdomen and pelvis was performed following the standard protocol without IV contrast. Oral enteric contrast was administered. RADIATION DOSE REDUCTION: This exam was performed according to the departmental dose-optimization program which includes automated exposure control, adjustment of the mA and/or kV according to patient size and/or use of iterative reconstruction technique. COMPARISON:  04/21/2022 FINDINGS: Lower chest: No acute abnormality. Coronary artery calcifications. Mild fibrotic change in the included bilateral lung bases. Trace bilateral pleural effusions. Hepatobiliary: Calcified lesions of the right lobe of the liver, unchanged, incompletely characterized by noncontrast CT (series 2, image 17). Status post cholecystectomy. No biliary dilatation. Pancreas: Unremarkable. No pancreatic ductal dilatation or surrounding inflammatory changes. Spleen: Normal in size without significant abnormality. Adrenals/Urinary Tract: Adrenal glands are unremarkable. Small nonobstructive calculi of the left kidney. Unchanged severe left hydronephrosis with soft tissue present in the left renal pelvis, poorly evaluated by noncontrast CT (series 2, image 34). Bladder is unremarkable. Stomach/Bowel: Stomach is within normal limits. Appendix appears normal. No evidence of bowel wall thickening, distention, or inflammatory changes. Descending and sigmoid diverticulosis. Vascular/Lymphatic: Aortic atherosclerosis. No enlarged abdominal or pelvic lymph nodes. Reproductive: Severe prostatomegaly. Other: No abdominal wall hernia or abnormality. No ascites. Musculoskeletal: No acute or significant osseous findings. IMPRESSION: 1. Unchanged severe left hydronephrosis with soft tissue present in the left renal pelvis, poorly evaluated by noncontrast CT. 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 2. Small nonobstructive calculi of the left kidney. 3.  Severe prostatomegaly. 4. Descending and sigmoid diverticulosis without evidence of acute diverticulitis. 5. Trace bilateral pleural effusions. 6. Coronary artery disease. Aortic Atherosclerosis (ICD10-I70.0). Electronically Signed   By: Delanna Ahmadi M.D.   On: 04/25/2022 16:03    Assessment/Plan: POD#1 left stent placement and bladder tumor resection If the patient remains afebrile overnight his foley can be removed in the morning and he can be discharged home.   LOS: 6 days   [name] 04/27/2022, 11:10 AM

## 2022-04-28 DIAGNOSIS — J9601 Acute respiratory failure with hypoxia: Secondary | ICD-10-CM | POA: Diagnosis not present

## 2022-04-28 DIAGNOSIS — A419 Sepsis, unspecified organism: Secondary | ICD-10-CM | POA: Diagnosis not present

## 2022-04-28 DIAGNOSIS — N179 Acute kidney failure, unspecified: Secondary | ICD-10-CM | POA: Diagnosis not present

## 2022-04-28 DIAGNOSIS — N10 Acute tubulo-interstitial nephritis: Secondary | ICD-10-CM | POA: Diagnosis not present

## 2022-04-28 LAB — GLUCOSE, CAPILLARY: Glucose-Capillary: 204 mg/dL — ABNORMAL HIGH (ref 70–99)

## 2022-04-28 MED ORDER — AMOXICILLIN-POT CLAVULANATE 875-125 MG PO TABS: 1.0000 | ORAL_TABLET | Freq: Two times a day (BID) | ORAL | 0 refills | Status: AC

## 2022-04-28 MED ORDER — SACCHAROMYCES BOULARDII 250 MG PO CAPS: 250.0000 mg | ORAL_CAPSULE | Freq: Two times a day (BID) | ORAL | 0 refills | Status: AC

## 2022-04-28 NOTE — TOC Transition Note (Signed)
Transition of Care [facility]) - CM/SW Discharge Note   Patient Details  Name: Joshua Osborne MRN: [id number] Date of Birth: 1940/12/16  [facility] CM/SW Contact:  Salome Arnt, LCSW Phone Number: 04/28/2022, 10:21 AM   Clinical Narrative:  Pt d/c today. Discussed recommendation for rolling walker with pt who agrees. No preference on agency. Referred and accepted by [facility] with [employer]. Pt requests walker be drop shipped to home. Orders in. Thedore Mins and RN updated. No other needs reported.     Final next level of care: Home/Self Care Barriers to Discharge: Barriers Resolved   Patient Goals and CMS Choice     Choice offered to / list presented to : Patient  Discharge Placement                  Name of family member notified: pt/wife Patient and family notified of of transfer: 04/28/22  Discharge Plan and Services                DME Arranged: Gilford Rile rolling DME Agency: [employer] Date DME Agency Contacted: 04/28/22 Time DME Agency Contacted: [phone] Representative spoke with at DME Agency: [place]) Interventions     Readmission Risk Interventions     No data to display

## 2022-04-28 NOTE — Progress Notes (Signed)
Pt discharged via WC to POV by assigned nurse. Wife with personal belongings in her possession.

## 2022-04-28 NOTE — TOC Progression Note (Signed)
Transition of Care [facility]) - Progression Note    Patient Details  Name: Joshua Osborne MRN: [id number] Date of Birth: 03-13-1941  Transition of Care [facility]) CM/SW Contact  Salome Arnt, [place] Phone Number: 04/28/2022, 8:09 AM  Clinical Narrative:  LCSW noted PT recommending rolling walker. Discussed with pt who reports improved balance today and does not feel it is needed. TOC will continue to follow.       Barriers to Discharge: Barriers Resolved  Expected Discharge Plan and Services           Expected Discharge Date: 04/27/22                                     Social Determinants of Health (SDOH) Interventions    Readmission Risk Interventions     No data to display

## 2022-04-28 NOTE — Discharge Summary (Signed)
Physician Discharge Summary  Verle Wheeling LZJ:[id number] DOB: 1941/07/19 DOA: 04/21/2022  PCP: Earney Mallet, MD  Admit date: 04/21/2022 Discharge date: 04/28/2022  Admitted From:  HOME  Disposition: HOME   Recommendations for Outpatient Follow-up:  Follow up with Dr. Alyson Ingles around 05/03/22 as scheduled Follow up with PCP in 1 week  Please obtain CBC in one week Please follow up on the following pending results:  Final culture results   Home Health:  Equipment ~ [place]  Discharge Condition: STABLE   CODE STATUS: FULL  DIET: resume home diet    Brief Hospitalization Summary: Please see all hospital notes, images, labs for full details of the hospitalization. 81 year old male with a history of depression, diabetes mellitus, urothelial carcinoma, hyperlipidemia presenting with 2-week history of generalized weakness.  In the past couple days, the patient has had fevers and chills.  He actually had shivering and rigors on the morning of 04/21/2022.  He has had poor oral intake.  His wife took his temperature on the morning of 04/21/2022 and it was 100.0 F.  He went to see his urologist, Dr. Lovena Neighbours, on 04/20/2022.  He was diagnosed with a UTI and started on cephalexin.  He took 2 doses prior to coming to the hospital.  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  He is scheduled tentatively for a laparoscopic left-sided nephroureterectomy on 05/30/2022.  He had 2 episodes of nausea and vomiting prior to coming to the hospital on the morning of admission.  He denies any abdominal pain but complains of left-sided flank pain which has worsened over the last couple days prior to admission.  He has been taking oxycodone 5 mg approximately twice daily since his renal biopsy.  He denies any headache, neck pain, chest pain, shortness of breath, cough, hemoptysis.  There is no diarrhea, hematochezia, melena, abdominal pain.  He has some  intermittent dysuria. In the ED, the patient was febrile up to 101.7 F.  He was tachycardic into the 140s.  He was hemodynamically stable.  WBC 34.4, hemoglobin 8.4, platelets 266,000.  Sodium 133, potassium 4.5, bicarbonate 23, serum creatinine 1.76.  AST 32, ALT 47, ALP 157, total bilirubin 1.3.  UA shows greater than 50 WBC, 21-50 RBC.  Lactic acid 1.2.  CT of the abdomen and pelvis showed increased left hydronephrosis with hydroureter secondary to persistent soft tissue within the left renal collecting system consistent with tumor extending to the UPJ.  There is nonobstructive left renal calculi.  The patient was given the appropriate fluid boluses and started on ceftriaxone.  He was admitted for further evaluation and treatment.  04/26/2022: left ureteral stent placement by Dr. Alyson Ingles  04/28/2022: remains afebrile, foley removed, DC home today, outpatient follow up with Dr. Alyson Ingles around 05/03/22 scheduled  Hospital Course by problem   * Septic shock - RESOLVED Presented with fever, tachycardia, leukocytosis Required low dose levophed overnight 7/14-7/15>>weaned off Lactate peaked 1.2 UA>50 WBC Follow blood and urine cultures--negative to date but pt was on cephalexin prior to admission CXR--no infiltrates Antibiotics broaden to zosyn WBC up to 41.6>>32.2>>22.4>>21.0 Continue IVF>>saline lock on 7/17 due to fluid overload -afebrile for last 24 hours  Acute respiratory failure with hypoxia ([place]) 7/17 developed hypoxia and tachypnea - resolved now -weaned to room air -personally reviewed 7/17 CXR--pulm edema, vasc congestion -lasix IV 40 mg x 1 on 7/17 -Echo: LVEF 60-65%   Hypomagnesemia repleted  AKI (acute kidney injury) ([place]) Multifactorial including sepsis/hypotension, obstruction,  hypovolemia -baseline creat 0.9-1.0 -serum creatinine peaked 1.76>>1.60>>1.66>>1.72 -overall slowly improving -continue IVF>>saline lock as pt is not fluid overloaded -urology  consulted>>discussed with Dr. McKenzie>>s/p left ureteral stent 7/19  Cancer related pain Left flank pain not well controlled D/c oxycodone continue dilaudid prn Changed back to po dilaudid after discussion with Dr. Alyson Ingles 7/17 -resume home pain meds at discharge -confirmed patient just picked up a 30 day rx on 04/21/22  Mixed hyperlipidemia Resume home meds  Controlled type 2 diabetes mellitus without complication, without long-term current use of insulin ([place]) Hold metformin 04/21/22 A1C--5.9 novolog sliding scale CBG (last 3)  Recent Labs    04/27/22 1552 04/27/22 2116 04/28/22 0724  GLUCAP 176* 150* 204*     Acute pyelonephritis WBC up 41.6,>>32.2>22.4>>21.0 --fever curve is improving>>spiked fever 7/17 around 1745 --pt clinically stable --broaden abx pending culture data>>zosyn started 7/15 --urine culture is neg but pt was on cephalexin prior to admission -04/24/22--repeat blood and urine culture--respiked fever 103.1 -urology consulted>>discussed with Dr. Alyson Ingles -left ureteral stent placed 04/26/2022  -dc home on oral augmentin to complete full course of antibiotics -probiotics also added  -per Dr. 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  No hypermetabolic lymph nodes.  No liver lesions hypermetabolic. -He underwent ureteroscopy and biopsy on 03/27/2022. 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  Muscularis propria not identified in the current specimen --laparoscopic left nephroureterectomy and TURBT tentatively planned 05/30/22 -follow up with Dr. Alyson Ingles around 05/03/22 already scheduled  Transitional cell carcinoma of renal pelvis, left ([place]) -PET scan (03/16/2022): Soft tissue mass within the left renal pelvis, SUV 6.7.  No hypermetabolic lymph nodes.  No liver lesions hypermetabolic. -He  underwent ureteroscopy and biopsy on 03/27/2022. 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  Muscularis propria not identified in the current specimen --laparoscopic left nephroureterectomy tentatively planned 05/30/22 --appreciate Dr. 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[place])   Acute pyelonephritis   Controlled type 2 diabetes mellitus without complication, without long-term current use of insulin ([facility])   Mixed hyperlipidemia   Cancer related pain   AKI (acute kidney injury) ([place])   Hypomagnesemia   Acute respiratory failure with hypoxia [facility])   Discharge Instructions:  Allergies as of 04/28/2022   No Known Allergies      Medication List     STOP taking these medications    Percocet 5-325 MG tablet Generic drug: oxyCODONE-acetaminophen       TAKE these medications    amoxicillin-clavulanate 875-125 MG tablet Commonly known as: AUGMENTIN Take 1 tablet by mouth 2 (two) times daily for 6 days.   citalopram 10 MG tablet Commonly known as: CELEXA Take 10 mg by mouth daily.   fluorouracil 5 % cream Commonly known as: EFUDEX Apply 1 Application topically 2 (two) times daily.   HYDROcodone-acetaminophen 5-325 MG tablet Commonly known as: NORCO/VICODIN Take 1 tablet by mouth 3 (three) times daily as needed.   latanoprost 0.005 % ophthalmic solution Commonly known as: XALATAN Place 1 drop into both eyes at bedtime.   metFORMIN 500  MG tablet Commonly known as: GLUCOPHAGE Take 500 mg by mouth 2 (two) times daily with a meal.   MULTI-VITAMIN DAILY PO Take 1 capsule by mouth daily.   niacin 500 MG  tablet Take 500 mg by mouth at bedtime.   omeprazole 20 MG capsule Commonly known as: PRILOSEC Take 20 mg by mouth daily.   saccharomyces boulardii 250 MG capsule Commonly known as: Florastor Take 1 capsule (250 mg total) by mouth 2 (two) times daily.   saw palmetto 500 MG capsule Take 500 mg by mouth 2 (two) times daily.   simvastatin 40 MG tablet Commonly known as: ZOCOR Take 40 mg by mouth daily.   tamsulosin 0.4 MG Caps capsule Commonly known as: FLOMAX Take 0.4 mg by mouth daily.   tizanidine 2 MG capsule Commonly known as: ZANAFLEX Take 2 mg by mouth at bedtime.               Durable Medical Equipment  (From admission, onward)           Start     Ordered   04/28/22 0949  For home use only DME Walker rolling  Once       Question Answer Comment  Walker: With [place] Wheels   Patient needs a walker to treat with the following condition Gait instability      04/28/22 0949            Follow-up Information     Earney Mallet, MD. Schedule an appointment as soon as possible for a visit in 1 week(s).   Specialty: Family Medicine Why: Hospital Follow Up Contact information: [place] [address] [phone]         Cleon Gustin, MD. Go on 05/03/2022.   Specialty: Urology Why: Hospital Follow Up Contact information: [address] [place] [address] [phone]                No Known Allergies Allergies as of 04/28/2022   No Known Allergies      Medication List     STOP taking these medications    Percocet 5-325 MG tablet Generic drug: oxyCODONE-acetaminophen       TAKE these medications    amoxicillin-clavulanate 875-125 MG tablet Commonly known as: AUGMENTIN Take 1 tablet by mouth 2 (two) times daily for 6 days.   citalopram 10 MG tablet Commonly known as: CELEXA Take 10 mg by mouth daily.   fluorouracil 5 % cream Commonly known as: EFUDEX Apply 1 Application topically 2 (two)  times daily.   HYDROcodone-acetaminophen 5-325 MG tablet Commonly known as: NORCO/VICODIN Take 1 tablet by mouth 3 (three) times daily as needed.   latanoprost 0.005 % ophthalmic solution Commonly known as: XALATAN Place 1 drop into both eyes at bedtime.   metFORMIN 500 MG tablet Commonly known as: GLUCOPHAGE Take 500 mg by mouth 2 (two) times daily with a meal.   MULTI-VITAMIN DAILY PO Take 1 capsule by mouth daily.   niacin 500 MG tablet Take 500 mg by mouth at bedtime.   omeprazole 20 MG capsule Commonly known as: PRILOSEC Take 20 mg by mouth daily.   saccharomyces boulardii 250 MG capsule Commonly known as: Florastor Take 1 capsule (250 mg total) by mouth 2 (two) times daily.   saw palmetto 500 MG capsule Take 500 mg by mouth 2 (two) times daily.   simvastatin 40 MG tablet Commonly known as: ZOCOR Take 40 mg by mouth daily.   tamsulosin  0.4 MG Caps capsule Commonly known as: FLOMAX Take 0.4 mg by mouth daily.   tizanidine 2 MG capsule Commonly known as: ZANAFLEX Take 2 mg by mouth at bedtime.               Durable Medical Equipment  (From admission, onward)           Start     Ordered   04/28/22 0949  For home use only DME Walker rolling  Once       Question Answer Comment  Walker: With 5 Inch Wheels   Patient needs a walker to treat with the following condition Gait instability      04/28/22 0949            Procedures/Studies: DG CHEST PORT 1 VIEW  Result Date: 04/27/2022 CLINICAL DATA:  81 year old male with 2 week history of generalized weakness admitted with septic shock EXAM: PORTABLE CHEST 1 VIEW COMPARISON:  Radiographs 04/24/2022 FINDINGS: Since 04/24/2022, improved lung volumes and slightly decreased pulmonary vascular congestion. Cardiomediastinal silhouette at the upper limits of normal. Pulmonary vascular congestion. Bibasilar atelectasis/consolidation. Probable small left pleural effusion. Aortic calcifications. IMPRESSION:  Findings favored to represent improving pulmonary edema with infection less likely though not excluded. Electronically Signed   By: Placido Sou M.D.   On: 04/27/2022 08:26   DG C-Arm 1-60 Min-No Report  Result Date: 04/26/2022 Fluoroscopy was utilized by the requesting physician.  No radiographic interpretation.   ECHOCARDIOGRAM COMPLETE  Result Date: 04/25/2022    ECHOCARDIOGRAM REPORT   Patient Name:   COLYN MIRON Date of Exam: 04/25/2022 Medical Rec #:  [id number]        Height:       72.0 in Accession #:    [id number]       Weight:       182.3 lb Date of Birth:  1940-12-29         BSA:          2.048 m Patient Age:    81 years         BP:           161/64 mmHg Patient Gender: M                HR:           79 bpm. Exam Location:  [facility] Procedure: 2D Echo, Cardiac Doppler and Color Doppler Indications:    CHF  History:        Patient has no prior history of Echocardiogram examinations.                 Risk Factors:Diabetes and Dyslipidemia. Bladder CA.  Sonographer:    Wenda Low Referring Phys: [phone] DAVID TAT IMPRESSIONS  1. Left ventricular ejection fraction, by estimation, is 60 to 65%. The left ventricle has normal function. The left ventricle has no regional wall motion abnormalities. There is mild left ventricular hypertrophy. Left ventricular diastolic parameters were normal.  2. Right ventricular systolic function is normal. The right ventricular size is normal. There is normal pulmonary artery systolic pressure.  3. The mitral valve is normal in structure. Trivial mitral valve regurgitation. No evidence of mitral stenosis.  4. The tricuspid valve is abnormal.  5. The aortic valve is tricuspid. There is mild calcification of the aortic valve. There is mild thickening of the aortic valve. Aortic valve regurgitation is not visualized. No aortic stenosis is present.  6. The inferior vena cava is normal in size with greater  than 50% respiratory variability, suggesting right atrial  pressure of 3 mmHg. FINDINGS  Left Ventricle: Left ventricular ejection fraction, by estimation, is 60 to 65%. The left ventricle has normal function. The left ventricle has no regional wall motion abnormalities. The left ventricular internal cavity size was normal in size. There is  mild left ventricular hypertrophy. Left ventricular diastolic parameters were normal. Right Ventricle: The right ventricular size is normal. Right vetricular wall thickness was not well visualized. Right ventricular systolic function is normal. There is normal pulmonary artery systolic pressure. The tricuspid regurgitant velocity is 2.70 m/s, and with an assumed right atrial pressure of 3 mmHg, the estimated right ventricular systolic pressure is 35.5 mmHg. Left Atrium: Left atrial size was normal in size. Right Atrium: Right atrial size was normal in size. Pericardium: There is no evidence of pericardial effusion. Mitral Valve: The mitral valve is normal in structure. Trivial mitral valve regurgitation. No evidence of mitral valve stenosis. MV peak gradient, 5.2 mmHg. The mean mitral valve gradient is 2.0 mmHg. Tricuspid Valve: The tricuspid valve is abnormal. Tricuspid valve regurgitation is mild . No evidence of tricuspid stenosis. Aortic Valve: The aortic valve is tricuspid. There is mild calcification of the aortic valve. There is mild thickening of the aortic valve. There is mild aortic valve annular calcification. Aortic valve regurgitation is not visualized. No aortic stenosis  is present. Aortic valve mean gradient measures 3.0 mmHg. Aortic valve peak gradient measures 5.7 mmHg. Aortic valve area, by VTI measures 2.21 cm. Pulmonic Valve: The pulmonic valve was not well visualized. Pulmonic valve regurgitation is mild. No evidence of pulmonic stenosis. Aorta: The aortic root is normal in size and structure. Venous: The inferior vena cava is normal in size with greater than 50% respiratory variability, suggesting right atrial  pressure of 3 mmHg. IAS/Shunts: No atrial level shunt detected by color flow Doppler.  LEFT VENTRICLE PLAX 2D LVIDd:         4.50 cm     Diastology LVIDs:         3.20 cm     LV e' medial:    8.05 cm/s LV PW:         1.30 cm     LV E/e' medial:  12.4 LV IVS:        1.20 cm     LV e' lateral:   10.80 cm/s LVOT diam:     1.90 cm     LV E/e' lateral: 9.3 LV SV:         62 LV SV Index:   30 LVOT Area:     2.84 cm  LV Volumes (MOD) LV vol d, MOD A2C: 62.4 ml LV vol d, MOD A4C: 63.7 ml LV vol s, MOD A2C: 25.0 ml LV vol s, MOD A4C: 27.0 ml LV SV MOD A2C:     37.4 ml LV SV MOD A4C:     63.7 ml LV SV MOD BP:      39.2 ml RIGHT VENTRICLE RV Basal diam:  3.65 cm RV Mid diam:    3.50 cm RV S prime:     14.10 cm/s TAPSE (M-mode): 2.1 cm LEFT ATRIUM             Index        RIGHT ATRIUM           Index LA diam:        4.30 cm 2.10 cm/m   RA Area:     17.30 cm LA  Vol [facility]):   48.4 ml 23.63 ml/m  RA Volume:   44.30 ml  21.63 ml/m LA Vol (A4C):   45.3 ml 22.12 ml/m LA Biplane Vol: 47.9 ml 23.38 ml/m  AORTIC VALVE                    PULMONIC VALVE AV Area (Vmax):    2.36 cm     PV Vmax:       0.97 m/s AV Area (Vmean):   2.13 cm     PV Peak grad:  3.8 mmHg AV Area (VTI):     2.21 cm AV Vmax:           119.00 cm/s AV Vmean:          82.300 cm/s AV VTI:            0.281 m AV Peak Grad:      5.7 mmHg AV Mean Grad:      3.0 mmHg LVOT Vmax:         99.20 cm/s LVOT Vmean:        61.800 cm/s LVOT VTI:          0.219 m LVOT/AV VTI ratio: 0.78  AORTA Ao Root diam: 3.50 cm Ao Asc diam:  3.80 cm MITRAL VALVE                TRICUSPID VALVE MV Area (PHT): 4.26 cm     TR Peak grad:   29.2 mmHg MV Area VTI:   1.66 cm     TR Vmax:        270.00 cm/s MV Peak grad:  5.2 mmHg MV Mean grad:  2.0 mmHg     SHUNTS MV Vmax:       1.14 m/s     Systemic VTI:  0.22 m MV Vmean:      69.2 cm/s    Systemic Diam: 1.90 cm MV Decel Time: 178 msec MV E velocity: 100.00 cm/s MV A velocity: 107.00 cm/s MV E/A ratio:  0.93 Carlyle Dolly MD Electronically  signed by Carlyle Dolly MD Signature Date/Time: 04/25/2022/4:06:13 PM    Final    CT ABDOMEN PELVIS WO CONTRAST  Result Date: 04/25/2022 CLINICAL DATA:  Left flank and abdominal pain generalized weakness, fever, chills, transitional cell carcinoma * Tracking Code: BO * EXAM: CT ABDOMEN AND PELVIS WITHOUT CONTRAST TECHNIQUE: Multidetector CT imaging of the abdomen and pelvis was performed following the standard protocol without IV contrast. Oral enteric contrast was administered. RADIATION DOSE REDUCTION: This exam was performed according to the departmental dose-optimization program which includes automated exposure control, adjustment of the mA and/or kV according to patient size and/or use of iterative reconstruction technique. COMPARISON:  04/21/2022 FINDINGS: Lower chest: No acute abnormality. Coronary artery calcifications. Mild fibrotic change in the included bilateral lung bases. Trace bilateral pleural effusions. Hepatobiliary: Calcified lesions of the right lobe of the liver, unchanged, incompletely characterized by noncontrast CT (series 2, image 17). Status post cholecystectomy. No biliary dilatation. Pancreas: Unremarkable. No pancreatic ductal dilatation or surrounding inflammatory changes. Spleen: Normal in size without significant abnormality. Adrenals/Urinary Tract: Adrenal glands are unremarkable. Small nonobstructive calculi of the left kidney. Unchanged severe left hydronephrosis with soft tissue present in the left renal pelvis, poorly evaluated by noncontrast CT (series 2, image 34). Bladder is unremarkable. Stomach/Bowel: Stomach is within normal limits. Appendix appears normal. No evidence of bowel wall thickening, distention, or inflammatory changes. Descending and sigmoid diverticulosis. Vascular/Lymphatic: Aortic atherosclerosis. No enlarged abdominal or  pelvic lymph nodes. Reproductive: Severe prostatomegaly. Other: No abdominal wall hernia or abnormality. No ascites. Musculoskeletal:  No acute or significant osseous findings. IMPRESSION: 1. Unchanged severe left hydronephrosis with soft tissue present in the left renal pelvis, poorly evaluated by noncontrast CT. 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 2. Small nonobstructive calculi of the left kidney. 3. Severe prostatomegaly. 4. Descending and sigmoid diverticulosis without evidence of acute diverticulitis. 5. Trace bilateral pleural effusions. 6. Coronary artery disease. Aortic Atherosclerosis (ICD10-I70.0). Electronically Signed   By: Delanna Ahmadi M.D.   On: 04/25/2022 16:03   DG CHEST PORT 1 VIEW  Result Date: 04/24/2022 CLINICAL DATA:  Fever, chills and shortness of breath EXAM: PORTABLE CHEST 1 VIEW COMPARISON:  04/21/2022 FINDINGS: Heart size remains normal. Mediastinal shadows are unremarkable except for mild atherosclerosis of the aorta. There is new abnormal bilateral pulmonary density in a pattern most consistent with pulmonary venous hypertension/fluid overload. Widespread developing pneumonia could have a similar appearance. IMPRESSION: Suspicion of congestive heart failure with developing edema. Developing widespread pneumonia could have a similar appearance, but is less likely. Electronically Signed   By: Nelson Chimes M.D.   On: 04/24/2022 18:53   CT ABDOMEN PELVIS W CONTRAST  Result Date: 04/21/2022 CLINICAL DATA:  LEFT flank pain and LEFT lower quadrant pain, fever, and chills for the past few days, suspected kidney stone; history of renal cell carcinoma, diverticulitis, pyelonephritis EXAM: CT ABDOMEN AND PELVIS WITH CONTRAST TECHNIQUE: Multidetector CT imaging of the abdomen and pelvis was performed using the standard protocol following bolus administration of intravenous contrast. RADIATION DOSE REDUCTION: This exam was performed according to the departmental dose-optimization program which includes automated exposure control, adjustment of the mA and/or kV according to patient size and/or use of  iterative reconstruction technique. CONTRAST:  74m OMNIPAQUE IOHEXOL 300 MG/ML SOLN IV. No oral contrast. COMPARISON:  03/07/2022 Correlation: PET-CT 03/16/2022 FINDINGS: Lower chest: Lung bases clear Hepatobiliary: Calcifications with surrounding low attenuation in the RIGHT lobe of the liver question treated metastases. Post cholecystectomy. No new hepatic abnormalities. Pancreas: Atrophic pancreas without mass Spleen: Normal appearance Adrenals/Urinary Tract: Adrenal glands and RIGHT kidney normal appearance. Again identified soft tissue within LEFT renal collecting system consistent with known tumor, extending to the reader 0 pelvic junction and causing increased LEFT hydronephrosis. Nonobstructing LEFT renal calculi. LEFT ureter is dilated in its course to the urinary bladder without calcification or mass. RIGHT ureter decompressed. Bladder unremarkable. Stomach/Bowel: Normal appendix. Diverticulosis of descending and sigmoid colon without evidence of diverticulitis. Stomach and remaining bowel loops unremarkable. Vascular/Lymphatic: Atherosclerotic calcifications aorta and iliac arteries without aneurysm. Vascular structures patent. No adenopathy. Reproductive: Significant prostatic enlargement, gland measuring 7.2 x 6.0 x 5.7 cm (volume = 130 cm^3). Seminal vesicles unremarkable. Other: No free air or free fluid. No hernia or inflammatory process. 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 Significant prostatic enlargement. Distal colonic diverticulosis without evidence of diverticulitis. 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 Aortic Atherosclerosis (ICD10-I70.0). Electronically Signed   By: MLavonia DanaM.D.   On: 04/21/2022 12:49   DG Chest 2  View  Result Date: 04/21/2022 CLINICAL DATA:  Suspected sepsis EXAM: CHEST - 2 VIEW COMPARISON:  None Available. FINDINGS: The heart size and mediastinal contours are within normal limits. Both lungs are clear. The visualized skeletal structures are unremarkable. IMPRESSION: No active cardiopulmonary disease. Electronically  Signed   By: Dorise Bullion III M.D.   On: 04/21/2022 11:30     Subjective: Pt reports feeling better today, ambulating well.  He would love to go home.  Foley was removed.   Discharge Exam: Vitals:   04/27/22 2116 04/28/22 0526  BP: (!) 142/66 132/65  Pulse: 87 81  Resp: 18 18  Temp: 98.6 F (37 C) 98.5 F (36.9 C)  SpO2: 98% 95%   Vitals:   04/27/22 0419 04/27/22 1325 04/27/22 2116 04/28/22 0526  BP: 132/69 115/65 (!) 142/66 132/65  Pulse: 70 78 87 81  Resp: '18 18 18 18  '$ Temp: 97.8 F (36.6 C) 98.4 F (36.9 C) 98.6 F (37 C) 98.5 F (36.9 C)  TempSrc:  Oral    SpO2: 94% 96% 98% 95%  Weight:      Height:       General: Pt is alert, awake, not in acute distress Cardiovascular: RRR, S1/S2 +, no rubs, no gallops Respiratory: CTA bilaterally, no wheezing, no rhonchi Abdominal: Soft, NT, ND, bowel sounds + Extremities: no edema, no cyanosis   The results of significant diagnostics from this hospitalization (including imaging, microbiology, ancillary and laboratory) are listed below for reference.     Microbiology: Recent Results (from the past 240 hour(s))  Culture, blood (Routine x 2)     Status: None   Collection Time: 04/21/22 11:17 AM   Specimen: BLOOD RIGHT ARM  Result Value Ref Range Status   Specimen Description   Final    BLOOD RIGHT ARM BOTTLES DRAWN AEROBIC AND ANAEROBIC   Special Requests   Final    Blood Culture results may not be optimal due to an excessive volume of blood received in culture bottles   Culture   Final    NO GROWTH 6 DAYS Performed at [facility], [address], [place], [place] [address]    Report Status  04/27/2022 FINAL  Final  Culture, blood (Routine x 2)     Status: None   Collection Time: 04/21/22 11:17 AM   Specimen: BLOOD LEFT ARM  Result Value Ref Range Status   Specimen Description BLOOD LEFT ARM BOTTLES DRAWN AEROBIC AND ANAEROBIC  Final   Special Requests Blood Culture adequate volume  Final   Culture   Final    NO GROWTH 6 DAYS Performed at [facility], [address], [place], [place] [address]    Report Status 04/27/2022 FINAL  Final  Urine Culture     Status: None   Collection Time: 04/21/22 11:49 AM   Specimen: Urine, Clean Catch  Result Value Ref Range Status   Specimen Description   Final    URINE, CLEAN CATCH Performed at [facility], [address] [place], [place] [address]    Special Requests   Final    NONE Performed at [facility], [address], [place], [place] [address]    Culture   Final    NO GROWTH Performed at [facility], [place] [address] [place], [place] [address]    Report Status 04/23/2022 FINAL  Final  MRSA Next Gen by PCR, Nasal     Status: None   Collection Time: 04/21/22  3:39 PM   Specimen: Nasal Mucosa; Nasal Swab  Result Value Ref Range Status   MRSA by PCR Next Gen NOT DETECTED NOT DETECTED Final    Comment: (NOTE) The GeneXpert MRSA Assay (FDA approved for NASAL specimens only), is one component of a comprehensive MRSA colonization surveillance program. It  is not intended to diagnose MRSA infection nor to guide or monitor treatment for MRSA infections. Test performance is not FDA approved in patients less than 51 years old. Performed at [facility], [address] [place], [place] [address]   Culture, blood (Routine X 2) w Reflex to ID Panel     Status: None (Preliminary result)   Collection Time: 04/24/22  6:38 PM   Specimen: Right Antecubital; Blood  Result Value Ref Range Status   Specimen Description RIGHT ANTECUBITAL  Final   Special Requests   Final    BOTTLES DRAWN AEROBIC AND ANAEROBIC Blood Culture adequate  volume   Culture   Final    NO GROWTH 4 DAYS Performed at [facility], [address] [place], [place] [address]    Report Status PENDING  Incomplete  Culture, blood (Routine X 2) w Reflex to ID Panel     Status: None (Preliminary result)   Collection Time: 04/24/22  6:38 PM   Specimen: Left Antecubital; Blood  Result Value Ref Range Status   Specimen Description LEFT ANTECUBITAL  Final   Special Requests   Final    BOTTLES DRAWN AEROBIC AND ANAEROBIC Blood Culture adequate volume   Culture   Final    NO GROWTH 4 DAYS Performed at [facility], [address] [place], [place] [address]    Report Status PENDING  Incomplete  Urine Culture     Status: None   Collection Time: 04/24/22  7:35 PM   Specimen: Urine, Clean Catch  Result Value Ref Range Status   Specimen Description   Final    URINE, CLEAN CATCH Performed at [facility], [address], [place], [place] [address]    Special Requests   Final    NONE Performed at [facility], [address], [place], [place] [address]    Culture   Final    NO GROWTH Performed at [facility], [place] [address] [place], [place] [address]    Report Status 04/26/2022 FINAL  Final  Urine Culture     Status: None   Collection Time: 04/26/22  8:17 AM   Specimen: PATH Cytology Urine  Result Value Ref Range Status   Specimen Description   Final    URINE, CLEAN CATCH Performed at [facility], [address] [place], [place] [address]    Special Requests   Final    NONE Performed at [facility], [address], [place], [place] [address]    Culture   Final    NO GROWTH Performed at [facility], [place] [address], [place], [place] [address]    Report Status 04/27/2022 FINAL  Final     Labs: BNP (last 3 results) Recent Labs    04/25/22 0408  BNP 948.5*   Basic Metabolic Panel: Recent Labs  Lab 04/23/22 0413 04/24/22 0352 04/25/22 0408 04/25/22 1415 04/26/22 0416 04/27/22 0517  NA 135 136 135 135  --  136  K  3.7 3.7 4.3 4.2  --  4.0  CL 102 105 99 98  --  100  CO2 '23 23 25 27  '$ --  27  GLUCOSE 167* 160* 148* 166*  --  163*  BUN '21 21 21 19  '$ --  23  CREATININE 1.60* 1.66* 1.72* 1.61*  --  1.39*  CALCIUM 8.3* 8.1* 8.4* 8.5*  --  8.7*  MG 1.4* 1.8 1.7  --  2.0 2.1   Liver Function Tests: Recent Labs  Lab 04/22/22 0350  04/23/22 0413 04/25/22 0408 04/25/22 1415 04/27/22 0517  AST 30 33 67* 57* 24  ALT 39 42 86* 80* 52*  ALKPHOS 134* 150* 253* 246* 192*  BILITOT 1.1 1.2 1.7* 1.3* 0.6  PROT 6.4* 6.4* 6.8 7.0 6.4*  ALBUMIN 3.0* 2.9* 2.9* 2.9* 2.7*   No results for input(s): "LIPASE", "AMYLASE" in the last 168 hours. No results for input(s): "AMMONIA" in the last 168 hours. CBC: Recent Labs  Lab 04/21/22 1114 04/22/22 0954 04/23/22 0413 04/24/22 0352 04/24/22 0626 04/24/22 1642 04/25/22 0408 04/26/22 0416 04/27/22 0517  WBC 34.4*   < > 32.2* 22.4*  --   --  21.0* 20.6* 19.4*  NEUTROABS 31.1*  --  28.8* 19.5*  --   --   --   --   --   HGB 8.4*   < > 7.6* 6.8* 7.1* 9.4* 9.1* 8.6* 8.6*  HCT 26.2*   < > 23.8* 20.7* 21.6* 28.3* 28.1* 26.3* 26.6*  MCV 64.2*   < > 66.5* 65.3*  --   --  67.9* 67.4* 68.4*  PLT 366   < > 316 316  --   --  385 424* 473*   < > = values in this interval not displayed.   Cardiac Enzymes: No results for input(s): "CKTOTAL", "CKMB", "CKMBINDEX", "TROPONINI" in the last 168 hours. BNP: Invalid input(s): "POCBNP" CBG: Recent Labs  Lab 04/27/22 0752 04/27/22 1111 04/27/22 1552 04/27/22 2116 04/28/22 0724  GLUCAP 150* 192* 176* 150* 204*   D-Dimer No results for input(s): "DDIMER" in the last 72 hours. Hgb A1c No results for input(s): "HGBA1C" in the last 72 hours. Lipid Profile No results for input(s): "CHOL", "HDL", "LDLCALC", "TRIG", "CHOLHDL", "LDLDIRECT" in the last 72 hours. Thyroid function studies No results for input(s): "TSH", "T4TOTAL", "T3FREE", "THYROIDAB" in the last 72 hours.  Invalid input(s): "FREET3" Anemia work up No results  for input(s): "VITAMINB12", "FOLATE", "FERRITIN", "TIBC", "IRON", "RETICCTPCT" in the last 72 hours. Urinalysis    Component Value Date/Time   COLORURINE STRAW (A) 04/24/2022 1935   APPEARANCEUR CLEAR 04/24/2022 1935   LABSPEC 1.006 04/24/2022 1935   PHURINE 6.0 04/24/2022 1935   GLUCOSEU NEGATIVE 04/24/2022 1935   HGBUR SMALL (A) 04/24/2022 [place] NEGATIVE 04/24/2022 [place] NEGATIVE 04/24/2022 1935   PROTEINUR NEGATIVE 04/24/2022 1935   NITRITE NEGATIVE 04/24/2022 1935   LEUKOCYTESUR NEGATIVE 04/24/2022 1935   Sepsis Labs Recent Labs  Lab 04/24/22 0352 04/25/22 0408 04/26/22 0416 04/27/22 0517  WBC 22.4* 21.0* 20.6* 19.4*   Microbiology Recent Results (from the past 240 hour(s))  Culture, blood (Routine x 2)     Status: None   Collection Time: 04/21/22 11:17 AM   Specimen: BLOOD RIGHT ARM  Result Value Ref Range Status   Specimen Description   Final    BLOOD RIGHT ARM BOTTLES DRAWN AEROBIC AND ANAEROBIC   Special Requests   Final    Blood Culture results may not be optimal due to an excessive volume of blood received in culture bottles   Culture   Final    NO GROWTH 6 DAYS Performed at [facility], [address] [place], [place] [address]    Report Status 04/27/2022 FINAL  Final  Culture, blood (Routine x 2)     Status: None   Collection Time: 04/21/22 11:17 AM   Specimen: BLOOD LEFT ARM  Result Value Ref Range Status   Specimen Description BLOOD LEFT ARM BOTTLES DRAWN AEROBIC AND ANAEROBIC  Final  Special Requests Blood Culture adequate volume  Final   Culture   Final    NO GROWTH 6 DAYS Performed at [facility], [address] [place], [place] [address]    Report Status 04/27/2022 FINAL  Final  Urine Culture     Status: None   Collection Time: 04/21/22 11:49 AM   Specimen: Urine, Clean Catch  Result Value Ref Range Status   Specimen Description   Final    URINE, CLEAN CATCH Performed at [facility], [address]  [place], [place] [address]    Special Requests   Final    NONE Performed at [facility], [address], [place], [place] [address]    Culture   Final    NO GROWTH Performed at [facility], [place] [address], [place], [place] [address]    Report Status 04/23/2022 FINAL  Final  MRSA Next Gen by PCR, Nasal     Status: None   Collection Time: 04/21/22  3:39 PM   Specimen: Nasal Mucosa; Nasal Swab  Result Value Ref Range Status   MRSA by PCR Next Gen NOT DETECTED NOT DETECTED Final    Comment: (NOTE) The GeneXpert MRSA Assay (FDA approved for NASAL specimens only), is one component of a comprehensive MRSA colonization surveillance program. It is not intended to diagnose MRSA infection nor to guide or monitor treatment for MRSA infections. Test performance is not FDA approved in patients less than 4 years old. Performed at [facility], [address] [place], [place] [address]   Culture, blood (Routine X 2) w Reflex to ID Panel     Status: None (Preliminary result)   Collection Time: 04/24/22  6:38 PM   Specimen: Right Antecubital; Blood  Result Value Ref Range Status   Specimen Description RIGHT ANTECUBITAL  Final   Special Requests   Final    BOTTLES DRAWN AEROBIC AND ANAEROBIC Blood Culture adequate volume   Culture   Final    NO GROWTH 4 DAYS Performed at [facility], [address] [place], [place] [address]    Report Status PENDING  Incomplete  Culture, blood (Routine X 2) w Reflex to ID Panel     Status: None (Preliminary result)   Collection Time: 04/24/22  6:38 PM   Specimen: Left Antecubital; Blood  Result Value Ref Range Status   Specimen Description LEFT ANTECUBITAL  Final   Special Requests   Final    BOTTLES DRAWN AEROBIC AND ANAEROBIC Blood Culture adequate volume   Culture   Final    NO GROWTH 4 DAYS Performed at [facility], [address], [place], [place] [address]    Report Status PENDING  Incomplete  Urine Culture     Status: None   Collection Time:  04/24/22  7:35 PM   Specimen: Urine, Clean Catch  Result Value Ref Range Status   Specimen Description   Final    URINE, CLEAN CATCH Performed at [facility], [address] [place], [place] [address]    Special Requests   Final    NONE Performed at [facility], [address] [place], [place] [address]    Culture   Final    NO GROWTH Performed at [facility], [place] [address], [place],  [address]    Report Status 04/26/2022 FINAL  Final  Urine Culture     Status: None   Collection Time: 04/26/22  8:17 AM   Specimen: PATH Cytology Urine  Result Value Ref Range Status  Specimen Description   Final    URINE, CLEAN CATCH Performed at [facility], [address], [place], [place] [address]    Special Requests   Final    NONE Performed at [facility], [address] [place], [place] [address]    Culture   Final    NO GROWTH Performed at [facility], [place] [address], [place], [place] [address]    Report Status 04/27/2022 FINAL  Final   Time coordinating discharge:  38 mins   SIGNED:  Irwin Brakeman, MD  [facility] 04/28/2022, 10:18 AM How to contact the [facility] Attending or Consulting provider [place] or covering provider during after hours [place], for this patient?  Check the care team in [facility] and look for a) attending/consulting [facility] provider listed and b) the [facility] team listed Log into [email or web address] and use [place] universal password to access. If you do not have the password, please contact the hospital operator. Locate the [facility] provider you are looking for under [facility] and page to a number that you can be directly reached. If you still have difficulty reaching the provider, please page the [facility] (Director on Call) for the Hospitalists listed on amion for assistance.

## 2022-04-29 LAB — CULTURE, BLOOD (ROUTINE X 2)
Culture: NO GROWTH
Culture: NO GROWTH
Special Requests: ADEQUATE
Special Requests: ADEQUATE

## 2022-05-03 ENCOUNTER — Ambulatory Visit (INDEPENDENT_AMBULATORY_CARE_PROVIDER_SITE_OTHER): Payer: MEDICARE | Admitting: Urology

## 2022-05-03 ENCOUNTER — Encounter: Payer: Self-pay | Admitting: Urology

## 2022-05-03 VITALS — BP 135/62 | HR 89

## 2022-05-03 DIAGNOSIS — C679 Malignant neoplasm of bladder, unspecified: Secondary | ICD-10-CM

## 2022-05-03 DIAGNOSIS — N1330 Unspecified hydronephrosis: Secondary | ICD-10-CM

## 2022-05-03 LAB — URINALYSIS, ROUTINE W REFLEX MICROSCOPIC
Bilirubin, UA: NEGATIVE
Glucose, UA: NEGATIVE
Ketones, UA: NEGATIVE
Nitrite, UA: NEGATIVE
Specific Gravity, UA: 1.02 (ref 1.005–1.030)
Urobilinogen, Ur: 0.2 mg/dL (ref 0.2–1.0)
pH, UA: 5.5 (ref 5.0–7.5)

## 2022-05-03 LAB — MICROSCOPIC EXAMINATION
RBC, Urine: >30 /hpf — AB (ref 0–2)
Renal Epithel, UA: NONE SEEN /hpf

## 2022-05-03 MED ORDER — MIRABEGRON ER 25 MG PO TB24: 25.0000 mg | ORAL_TABLET | Freq: Every day | ORAL | 0 refills | Status: DC

## 2022-05-03 MED ORDER — OXYCODONE-ACETAMINOPHEN 5-325 MG PO TABS: 1.0000 | ORAL_TABLET | ORAL | 0 refills | Status: DC | PRN

## 2022-05-03 NOTE — Progress Notes (Signed)
05/03/2022 1:18 PM   Joshua Osborne 1941/06/03 [id number]  Referring provider: Earney Mallet, MD [address] [place] [address]  Followup left stent placement   HPI: Mr Sautter is a 81yo here for followup after left ureteral stent placement. No fevers. 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 He is scheduled for left nephroureterectomy 8/22. He has urinary urgency, urinary frequency with the stent in place. He is having hematuria with the stent in place.    PMH: Past Medical History:  Diagnosis Date   Bladder tumor    Cancer ([place])    Diabetes mellitus without complication ([place])     Surgical History: Past Surgical History:  Procedure Laterality Date   CYSTOSCOPY WITH RETROGRADE PYELOGRAM, URETEROSCOPY AND STENT PLACEMENT Left 04/26/2022   Procedure: CYSTOSCOPY WITH RETROGRADE PYELOGRAM, URETEROSCOPY AND STENT PLACEMENT;  Surgeon: Cleon Gustin, MD;  Location: [facility];  Service: Urology;  Laterality: Left;   LIVER BIOPSY     TRANSURETHRAL RESECTION OF BLADDER TUMOR  04/26/2022   Procedure: TRANSURETHRAL RESECTION OF BLADDER TUMOR (TURBT);  Surgeon: Cleon Gustin, MD;  Location: [facility];  Service: Urology;;    Home Medications:  Allergies as of 05/03/2022   No Known Allergies      Medication List        Accurate as of May 03, 2022  1:18 PM. If you have any questions, ask your nurse or doctor.          amoxicillin-clavulanate 875-125 MG tablet Commonly known as: AUGMENTIN Take 1 tablet by mouth 2 (two) times daily for 6 days.   citalopram 10 MG tablet Commonly known as: CELEXA Take 10 mg by mouth daily.   fluorouracil 5 % cream Commonly known as: EFUDEX Apply 1 Application topically 2 (two) times daily.   HYDROcodone-acetaminophen 5-325 MG tablet Commonly known as: NORCO/VICODIN Take 1 tablet by mouth 3 (three) times daily as needed.   latanoprost 0.005 % ophthalmic solution Commonly known as: XALATAN Place 1 drop into both eyes at  bedtime.   metFORMIN 500 MG tablet Commonly known as: GLUCOPHAGE Take 500 mg by mouth 2 (two) times daily with a meal.   MULTI-VITAMIN DAILY PO Take 1 capsule by mouth daily.   niacin 500 MG tablet Take 500 mg by mouth at bedtime.   omeprazole 20 MG capsule Commonly known as: PRILOSEC Take 20 mg by mouth daily.   saccharomyces boulardii 250 MG capsule Commonly known as: Florastor Take 1 capsule (250 mg total) by mouth 2 (two) times daily.   saw palmetto 500 MG capsule Take 500 mg by mouth 2 (two) times daily.   simvastatin 40 MG tablet Commonly known as: ZOCOR Take 40 mg by mouth daily.   tamsulosin 0.4 MG Caps capsule Commonly known as: FLOMAX Take 0.4 mg by mouth daily.   tizanidine 2 MG capsule Commonly known as: ZANAFLEX Take 2 mg by mouth at bedtime.        Allergies: No Known Allergies  Family History: No family history on file.  Social History:  reports that he has quit smoking. His smoking use included cigarettes. He has never used smokeless tobacco. He reports that he does not currently use alcohol. No history on file for drug use.  ROS: All other review of systems were reviewed and are negative except what is noted above in HPI  Physical Exam: BP 135/62   Pulse 89   Constitutional:  Alert and oriented, No acute distress. HEENT: [place] AT, moist mucus membranes.  Trachea  midline, no masses. Cardiovascular: No clubbing, cyanosis, or edema. Respiratory: Normal respiratory effort, no increased work of breathing. GI: Abdomen is soft, nontender, nondistended, no abdominal masses GU: No CVA tenderness.  Lymph: No cervical or inguinal lymphadenopathy. Skin: No rashes, bruises or suspicious lesions. Neurologic: Grossly intact, no focal deficits, moving all 4 extremities. Psychiatric: Normal mood and affect.  Laboratory Data: Lab Results  Component Value Date   WBC 19.4 (H) 04/27/2022   HGB 8.6 (L) 04/27/2022   HCT 26.6 (L) 04/27/2022   MCV 68.4 (L)  04/27/2022   PLT 473 (H) 04/27/2022    Lab Results  Component Value Date   CREATININE 1.39 (H) 04/27/2022    No results found for: "PSA"  No results found for: "TESTOSTERONE"  Lab Results  Component Value Date   HGBA1C 5.9 (H) 04/21/2022    Urinalysis    Component Value Date/Time   COLORURINE STRAW (A) 04/24/2022 1935   APPEARANCEUR CLEAR 04/24/2022 1935   LABSPEC 1.006 04/24/2022 1935   PHURINE 6.0 04/24/2022 1935   GLUCOSEU NEGATIVE 04/24/2022 1935   HGBUR SMALL (A) 04/24/2022 1935   BILIRUBINUR NEGATIVE 04/24/2022 1935   KETONESUR NEGATIVE 04/24/2022 1935   PROTEINUR NEGATIVE 04/24/2022 1935   NITRITE NEGATIVE 04/24/2022 1935   LEUKOCYTESUR NEGATIVE 04/24/2022 1935    Lab Results  Component Value Date   BACTERIA NONE SEEN 04/24/2022    Pertinent Imaging:  No results found for this or any previous visit.  No results found for this or any previous visit.  No results found for this or any previous visit.  No results found for this or any previous visit.  No results found for this or any previous visit.  No results found for this or any previous visit.  No results found for this or any previous visit.  No results found for this or any previous visit.   Assessment & Plan:    1. Hydronephrosis of left kidney -continue indwelling ureteral stent  2. Bladder cancer -I will see him back in 3 months for cystoscopy   No follow-ups on file.  [name], MD  [facility] [place]

## 2022-05-03 NOTE — Patient Instructions (Signed)

## 2022-05-03 NOTE — Addendum Note (Signed)
Addended by: Audie Box on: 05/03/2022 03:50 PM   Modules accepted: Orders

## 2022-05-15 ENCOUNTER — Other Ambulatory Visit: Payer: Self-pay | Admitting: Urology

## 2022-05-15 ENCOUNTER — Other Ambulatory Visit: Payer: Self-pay

## 2022-05-15 MED ORDER — OXYCODONE-ACETAMINOPHEN 5-325 MG PO TABS: 1.0000 | ORAL_TABLET | ORAL | 0 refills | Status: DC | PRN

## 2022-05-15 NOTE — Telephone Encounter (Signed)
Patient request for pain medication please.

## 2022-05-17 ENCOUNTER — Telehealth: Payer: Self-pay

## 2022-05-17 NOTE — Telephone Encounter (Signed)
Patients wife called 05/16/2022 and left a vm advising  they pharmacy did not receive the prescription sent in on 8/7 by Dr. Junious Silk. She wanted to a clinical member to call her back to see what she needed to do. .   Medication: oxyCODONE-acetaminophen (PERCOCET) 5-325 MG tablet  Pharmacy: [id number] [place] [place]

## 2022-05-17 NOTE — Telephone Encounter (Signed)
Please advised  

## 2022-05-23 NOTE — Progress Notes (Addendum)
Anesthesia Review:  PCP: Cardiologist : Chest x-ray : 04/27/22- 1 view  EKG : 04/24/22  Echo : 04/25/22  Stress test: Cardiac Cath :  Activity level:  Sleep Study/ CPAP : Fasting Blood Sugar :      / Checks Blood Sugar -- times a day:   Blood Thinner/ Instructions /Last Dose: ASA / Instructions/ Last Dose :   04/26/22- cysto

## 2022-05-24 ENCOUNTER — Other Ambulatory Visit (HOSPITAL_COMMUNITY): Payer: Self-pay

## 2022-05-24 NOTE — Patient Instructions (Addendum)
SURGICAL WAITING ROOM VISITATION Patients having surgery or a procedure may have no more than 2 support people in the waiting area - these visitors may rotate.   Children under the age of 19 must have an adult with them who is not the patient. If the patient needs to stay at the hospital during part of their recovery, the visitor guidelines for inpatient rooms apply. Pre-op nurse will coordinate an appropriate time for 1 support person to accompany patient in pre-op.  This support person may not rotate.    Please refer to the [facility] website for the visitor guidelines for Inpatients (after your surgery is over and you are in a regular room).       Your procedure is scheduled on:  05/30/22    Report to [facility] Main Entrance    Report to admitting at  [place] AM   Call this number if you have problems the morning of surgery [phone]   Do not eat food :After Midnight.   After Midnight you may have the following liquids until ____ 0415__ AM DAY OF SURGERY  Water Non-Citrus Juices (without pulp, NO RED) Carbonated Beverages Black Coffee (NO MILK/CREAM OR CREAMERS, sugar ok)  Clear Tea (NO MILK/CREAM OR CREAMERS, sugar ok) regular and decaf                             Plain Jell-O (NO RED)                                           Fruit ices (not with fruit pulp, NO RED)                                     Popsicles (NO RED)                                                               Sports drinks like Gatorade (NO RED)  CLEAR LIQUID DIET THE DAY BEFORE SURGERY.                             If you have questions, please contact your surgeon's office.       Oral Hygiene is also important to reduce your risk of infection.                                    Remember - BRUSH YOUR TEETH THE MORNING OF SURGERY WITH YOUR REGULAR TOOTHPASTE   Do NOT smoke after Midnight   Take these medicines the morning of surgery with A SIP OF WATER:  cITALOPRAM, MYRBETRIQ,  OMEPRAZOLE   DO NOT TAKE ANY ORAL DIABETIC MEDICATIONS DAY OF YOUR SURGERY  Bring CPAP mask and tubing day of surgery.                              You may not have any  metal on your body including hair pins, jewelry, and body piercing             Do not wear make-up, lotions, powders, perfumes/cologne, or deodorant  Do not wear nail polish including gel and S&S, artificial/acrylic nails, or any other type of covering on natural nails including finger and toenails. If you have artificial nails, gel coating, etc. that needs to be removed by a nail salon please have this removed prior to surgery or surgery may need to be canceled/ delayed if the surgeon/ anesthesia feels like they are unable to be safely monitored.   Do not shave  48 hours prior to surgery.               Men may shave face and neck.   Do not bring valuables to the hospital. [place].   Contacts, dentures or bridgework may not be worn into surgery.   Bring small overnight bag day of surgery.   DO NOT [place]. PHARMACY WILL DISPENSE MEDICATIONS LISTED ON YOUR MEDICATION LIST TO YOU DURING YOUR ADMISSION [place]!    Patients discharged on the day of surgery will not be allowed to drive home.  Someone NEEDS to stay with you for the first 24 hours after anesthesia.   Special Instructions: Bring a copy of your healthcare power of attorney and living will documents         the day of surgery if you haven't scanned them before.              Please read over the following fact sheets you were given: IF YOU HAVE QUESTIONS ABOUT YOUR PRE-OP INSTRUCTIONS PLEASE CALL [phone]     [facility] - Preparing for Surgery Before surgery, you can play an important role.  Because skin is not sterile, your skin needs to be as free of germs as possible.  You can reduce the number of germs on your skin by washing with CHG (chlorahexidine  gluconate) soap before surgery.  CHG is an antiseptic cleaner which kills germs and bonds with the skin to continue killing germs even after washing. Please DO NOT use if you have an allergy to CHG or antibacterial soaps.  If your skin becomes reddened/irritated stop using the CHG and inform your nurse when you arrive at Short Stay. Do not shave (including legs and underarms) for at least 48 hours prior to the first CHG shower.  You may shave your face/neck. Please follow these instructions carefully:  1.  Shower with CHG Soap the night before surgery and the  morning of Surgery.  2.  If you choose to wash your hair, wash your hair first as usual with your  normal  shampoo.  3.  After you shampoo, rinse your hair and body thoroughly to remove the  shampoo.                           4.  Use CHG as you would any other liquid soap.  You can apply chg directly  to the skin and wash                       Gently with a scrungie or clean washcloth.  5.  Apply the CHG Soap to your body ONLY FROM THE NECK  DOWN.   Do not use on face/ open                           Wound or open sores. Avoid contact with eyes, ears mouth and genitals (private parts).                       Wash face,  Genitals (private parts) with your normal soap.             6.  Wash thoroughly, paying special attention to the area where your surgery  will be performed.  7.  Thoroughly rinse your body with warm water from the neck down.  8.  DO NOT shower/wash with your normal soap after using and rinsing off  the CHG Soap.                9.  Pat yourself dry with a clean towel.            10.  Wear clean pajamas.            11.  Place clean sheets on your bed the night of your first shower and do not  sleep with pets. Day of Surgery : Do not apply any lotions/deodorants the morning of surgery.  Please wear clean clothes to the hospital/surgery center.  FAILURE TO FOLLOW THESE INSTRUCTIONS MAY RESULT IN THE CANCELLATION OF YOUR  SURGERY PATIENT SIGNATURE_________________________________  NURSE SIGNATURE__________________________________  ________________________________________________________________________

## 2022-05-25 ENCOUNTER — Encounter (HOSPITAL_COMMUNITY)
Admission: RE | Admit: 2022-05-25 | Discharge: 2022-05-25 | Disposition: A | Discharge status: Home / Self Care | Payer: MEDICARE | Source: Ambulatory Visit | Attending: Urology | Admitting: Urology

## 2022-05-25 ENCOUNTER — Other Ambulatory Visit: Payer: Self-pay

## 2022-05-25 ENCOUNTER — Encounter (HOSPITAL_COMMUNITY): Payer: Self-pay

## 2022-05-25 VITALS — BP 137/68 | HR 63 | Temp 98.3°F | Resp 16 | Ht 72.0 in | Wt 177.0 lb

## 2022-05-25 DIAGNOSIS — Z01818 Encounter for other preprocedural examination: Secondary | ICD-10-CM

## 2022-05-25 DIAGNOSIS — Z01812 Encounter for preprocedural laboratory examination: Secondary | ICD-10-CM | POA: Diagnosis present

## 2022-05-25 HISTORY — DX: Anemia, unspecified: D64.9

## 2022-05-25 HISTORY — DX: Sleep apnea, unspecified: G47.30

## 2022-05-25 HISTORY — DX: Unspecified osteoarthritis, unspecified site: M19.90

## 2022-05-25 HISTORY — DX: Cardiac murmur, unspecified: R01.1

## 2022-05-25 HISTORY — DX: Personal history of urinary calculi: Z87.442

## 2022-05-25 HISTORY — DX: Anxiety disorder, unspecified: F41.9

## 2022-05-25 HISTORY — DX: Gastro-esophageal reflux disease without esophagitis: K21.9

## 2022-05-25 LAB — CBC
HCT: 28.7 % — ABNORMAL LOW (ref 39.0–52.0)
Hemoglobin: 9.2 g/dL — ABNORMAL LOW (ref 13.0–17.0)
MCH: 21.5 pg — ABNORMAL LOW (ref 26.0–34.0)
MCHC: 32.1 g/dL (ref 30.0–36.0)
MCV: 67.1 fL — ABNORMAL LOW (ref 80.0–100.0)
Platelets: 504 10*3/uL — ABNORMAL HIGH (ref 150–400)
RBC: 4.28 MIL/uL (ref 4.22–5.81)
RDW: 18.3 % — ABNORMAL HIGH (ref 11.5–15.5)
WBC: 15.9 10*3/uL — ABNORMAL HIGH (ref 4.0–10.5)
nRBC: 0 % (ref 0.0–0.2)

## 2022-05-25 LAB — BASIC METABOLIC PANEL
Anion gap: 9 (ref 5–15)
BUN: 35 mg/dL — ABNORMAL HIGH (ref 8–23)
CO2: 25 mmol/L (ref 22–32)
Calcium: 9.2 mg/dL (ref 8.9–10.3)
Chloride: 103 mmol/L (ref 98–111)
Creatinine, Ser: 1.98 mg/dL — ABNORMAL HIGH (ref 0.61–1.24)
GFR, Estimated: 34 mL/min — ABNORMAL LOW (ref >60)
Glucose, Bld: 127 mg/dL — ABNORMAL HIGH (ref 70–99)
Potassium: 5.6 mmol/L — ABNORMAL HIGH (ref 3.5–5.1)
Sodium: 137 mmol/L (ref 135–145)

## 2022-05-25 LAB — GLUCOSE, CAPILLARY: Glucose-Capillary: 121 mg/dL — ABNORMAL HIGH (ref 70–99)

## 2022-05-29 ENCOUNTER — Other Ambulatory Visit: Payer: MEDICARE

## 2022-05-29 NOTE — Anesthesia Preprocedure Evaluation (Addendum)
Anesthesia Evaluation    Reviewed: Allergy & Precautions, Patient's Chart, lab work & pertinent test results  Airway Mallampati: III  TM Distance: >3 FB Neck ROM: Full    Dental  (+) Dental Advisory Given, Missing, Chipped, Caps, Poor Dentition   Pulmonary sleep apnea and Continuous Positive Airway Pressure Ventilation , former smoker,    Pulmonary exam normal breath sounds clear to auscultation       Cardiovascular negative cardio ROS Normal cardiovascular exam Rhythm:Regular Rate:Normal  1. Left ventricular ejection fraction, by estimation, is 60 to 65%. The  left ventricle has normal function. The left ventricle has no regional  wall motion abnormalities. There is mild left ventricular hypertrophy.  Left ventricular diastolic parameters  were normal.  2. Right ventricular systolic function is normal. The right ventricular  size is normal. There is normal pulmonary artery systolic pressure.  3. The mitral valve is normal in structure. Trivial mitral valve  regurgitation. No evidence of mitral stenosis.  4. The tricuspid valve is abnormal.  5. The aortic valve is tricuspid. There is mild calcification of the  aortic valve. There is mild thickening of the aortic valve. Aortic valve  regurgitation is not visualized. No aortic stenosis is present.  6. The inferior vena cava is normal in size with greater than 50%  respiratory variability, suggesting right atrial    Neuro/Psych Anxiety negative neurological ROS  negative psych ROS   GI/Hepatic Neg liver ROS, GERD  Medicated and Controlled,  Endo/Other  diabetes, Well Controlled, Type 2, Oral Hypoglycemic Agents  Renal/GU Renal Insufficiency and CRFRenal disease (AKI) Bladder dysfunction (Bladder cancer)      Musculoskeletal  (+) Arthritis , Osteoarthritis,    Abdominal   Peds negative pediatric ROS (+)  Hematology  (+) Blood dyscrasia, anemia ,   Anesthesia  Other Findings   Reproductive/Obstetrics negative OB ROS                             Anesthesia Physical  Anesthesia Plan  ASA: 3  Anesthesia Plan: General   Post-op Pain Management: Dilaudid IV   Induction: Intravenous  PONV Risk Score and Plan: 3 and Ondansetron, Dexamethasone and Treatment may vary due to age or medical condition  Airway Management Planned: Oral ETT  Additional Equipment: ClearSight  Intra-op Plan:   Post-operative Plan: Extubation in OR  Informed Consent: I have reviewed the patients History and Physical, chart, labs and discussed the procedure including the risks, benefits and alternatives for the proposed anesthesia with the patient or authorized representative who has indicated his/her understanding and acceptance.     Dental advisory given  Plan Discussed with: CRNA and Anesthesiologist  Anesthesia Plan Comments: ( 2 large bore IV's)       Anesthesia Quick Evaluation

## 2022-05-30 ENCOUNTER — Encounter (HOSPITAL_COMMUNITY)
Admission: RE | Disposition: A | Discharge status: Home / Self Care | Payer: Self-pay | Source: Home / Self Care | Attending: Urology

## 2022-05-30 ENCOUNTER — Encounter (HOSPITAL_COMMUNITY): Payer: Self-pay | Admitting: Urology

## 2022-05-30 ENCOUNTER — Inpatient Hospital Stay (HOSPITAL_COMMUNITY): Payer: MEDICARE | Admitting: Physician Assistant

## 2022-05-30 ENCOUNTER — Inpatient Hospital Stay (HOSPITAL_COMMUNITY): Payer: MEDICARE | Admitting: Anesthesiology

## 2022-05-30 ENCOUNTER — Other Ambulatory Visit: Payer: Self-pay

## 2022-05-30 ENCOUNTER — Inpatient Hospital Stay (HOSPITAL_COMMUNITY)
Admission: RE | Admit: 2022-05-30 | Discharge: 2022-06-03 | DRG: 657 | Disposition: A | Discharge status: Home / Self Care | Payer: MEDICARE | Attending: Urology | Admitting: Urology

## 2022-05-30 DIAGNOSIS — D62 Acute posthemorrhagic anemia: Secondary | ICD-10-CM | POA: Diagnosis not present

## 2022-05-30 DIAGNOSIS — C652 Malignant neoplasm of left renal pelvis: Secondary | ICD-10-CM

## 2022-05-30 DIAGNOSIS — E119 Type 2 diabetes mellitus without complications: Secondary | ICD-10-CM

## 2022-05-30 DIAGNOSIS — C679 Malignant neoplasm of bladder, unspecified: Secondary | ICD-10-CM

## 2022-05-30 DIAGNOSIS — Z7984 Long term (current) use of oral hypoglycemic drugs: Secondary | ICD-10-CM

## 2022-05-30 DIAGNOSIS — N3289 Other specified disorders of bladder: Secondary | ICD-10-CM | POA: Diagnosis not present

## 2022-05-30 DIAGNOSIS — E875 Hyperkalemia: Secondary | ICD-10-CM | POA: Diagnosis not present

## 2022-05-30 DIAGNOSIS — C642 Malignant neoplasm of left kidney, except renal pelvis: Secondary | ICD-10-CM | POA: Diagnosis present

## 2022-05-30 DIAGNOSIS — R599 Enlarged lymph nodes, unspecified: Secondary | ICD-10-CM | POA: Diagnosis not present

## 2022-05-30 DIAGNOSIS — Z87891 Personal history of nicotine dependence: Secondary | ICD-10-CM

## 2022-05-30 DIAGNOSIS — Z01818 Encounter for other preprocedural examination: Secondary | ICD-10-CM

## 2022-05-30 DIAGNOSIS — C673 Malignant neoplasm of anterior wall of bladder: Secondary | ICD-10-CM | POA: Diagnosis present

## 2022-05-30 DIAGNOSIS — D649 Anemia, unspecified: Secondary | ICD-10-CM

## 2022-05-30 DIAGNOSIS — E1165 Type 2 diabetes mellitus with hyperglycemia: Secondary | ICD-10-CM | POA: Diagnosis not present

## 2022-05-30 HISTORY — PX: ROBOT ASSITED LAPAROSCOPIC NEPHROURETERECTOMY: SHX6077

## 2022-05-30 HISTORY — PX: CYSTOSCOPY: SHX5120

## 2022-05-30 LAB — GLUCOSE, CAPILLARY
Glucose-Capillary: 158 mg/dL — ABNORMAL HIGH (ref 70–99)
Glucose-Capillary: 189 mg/dL — ABNORMAL HIGH (ref 70–99)
Glucose-Capillary: 166 mg/dL — ABNORMAL HIGH (ref 70–99)
Glucose-Capillary: 170 mg/dL — ABNORMAL HIGH (ref 70–99)

## 2022-05-30 SURGERY — NEPHROURETERECTOMY, ROBOT-ASSISTED, LAPAROSCOPIC
Anesthesia: General

## 2022-05-30 MED ORDER — SODIUM CHLORIDE 0.9 % IR SOLN
Status: DC | PRN
  Administered 2022-05-30: 9000 mL

## 2022-05-30 MED ORDER — ACETAMINOPHEN 160 MG/5ML PO SOLN: 325.0000 mg | ORAL | Status: DC | PRN

## 2022-05-30 MED ORDER — DEXAMETHASONE SODIUM PHOSPHATE 4 MG/ML IJ SOLN
INTRAMUSCULAR | Status: DC | PRN
  Administered 2022-05-30: 10 mg via INTRAVENOUS

## 2022-05-30 MED ORDER — BUPIVACAINE LIPOSOME 1.3 % IJ SUSP
INTRAMUSCULAR | Status: DC | PRN
  Administered 2022-05-30: 20 mL

## 2022-05-30 MED ORDER — PHENYLEPHRINE HCL-NACL 20-0.9 MG/250ML-% IV SOLN
INTRAVENOUS | Status: DC | PRN
  Administered 2022-05-30: 30 ug/min via INTRAVENOUS

## 2022-05-30 MED ORDER — MORPHINE SULFATE (PF) 2 MG/ML IV SOLN
2.0000 mg | INTRAVENOUS | Status: DC | PRN
  Administered 2022-05-30 – 2022-05-31 (×5): 2 mg via INTRAVENOUS
  Filled 2022-05-30 (×5): qty 1

## 2022-05-30 MED ORDER — OXYCODONE HCL 5 MG/5ML PO SOLN: 5.0000 mg | Freq: Once | ORAL | Status: DC | PRN

## 2022-05-30 MED ORDER — TAMSULOSIN HCL 0.4 MG PO CAPS
0.4000 mg | ORAL_CAPSULE | Freq: Every day | ORAL | Status: DC
  Administered 2022-05-30 – 2022-06-02 (×4): 0.4 mg via ORAL
  Filled 2022-05-30 (×5): qty 1

## 2022-05-30 MED ORDER — LACTATED RINGERS IV SOLN: INTRAVENOUS | Status: DC

## 2022-05-30 MED ORDER — CITALOPRAM HYDROBROMIDE 10 MG PO TABS
10.0000 mg | ORAL_TABLET | Freq: Every day | ORAL | Status: DC
  Administered 2022-05-31 – 2022-06-03 (×4): 10 mg via ORAL
  Filled 2022-05-30 (×4): qty 1

## 2022-05-30 MED ORDER — SUGAMMADEX SODIUM 200 MG/2ML IV SOLN
INTRAVENOUS | Status: DC | PRN
  Administered 2022-05-30: 200 mg via INTRAVENOUS

## 2022-05-30 MED ORDER — ACETAMINOPHEN 500 MG PO TABS
1000.0000 mg | ORAL_TABLET | Freq: Four times a day (QID) | ORAL | Status: DC
Start: 2022-05-30 — End: 2022-06-03
  Administered 2022-05-30 – 2022-06-03 (×11): 1000 mg via ORAL
  Filled 2022-05-30 (×14): qty 2

## 2022-05-30 MED ORDER — ONDANSETRON HCL 4 MG/2ML IJ SOLN: 4.0000 mg | Freq: Once | INTRAMUSCULAR | Status: DC | PRN

## 2022-05-30 MED ORDER — EPHEDRINE SULFATE-NACL 50-0.9 MG/10ML-% IV SOSY
PREFILLED_SYRINGE | INTRAVENOUS | Status: DC | PRN
  Administered 2022-05-30 (×3): 5 mg via INTRAVENOUS

## 2022-05-30 MED ORDER — ROCURONIUM BROMIDE 10 MG/ML (PF) SYRINGE
PREFILLED_SYRINGE | INTRAVENOUS | Status: AC
  Filled 2022-05-30: qty 10

## 2022-05-30 MED ORDER — SODIUM CHLORIDE (PF) 0.9 % IJ SOLN
INTRAMUSCULAR | Status: AC
  Filled 2022-05-30: qty 20

## 2022-05-30 MED ORDER — PHENYLEPHRINE HCL-NACL 20-0.9 MG/250ML-% IV SOLN
INTRAVENOUS | Status: AC
  Filled 2022-05-30: qty 750

## 2022-05-30 MED ORDER — OXYCODONE HCL 5 MG PO TABS: 5.0000 mg | ORAL_TABLET | Freq: Once | ORAL | Status: DC | PRN

## 2022-05-30 MED ORDER — FENTANYL CITRATE (PF) 250 MCG/5ML IJ SOLN
INTRAMUSCULAR | Status: AC
  Filled 2022-05-30: qty 5

## 2022-05-30 MED ORDER — PHENYLEPHRINE 80 MCG/ML (10ML) SYRINGE FOR IV PUSH (FOR BLOOD PRESSURE SUPPORT)
PREFILLED_SYRINGE | INTRAVENOUS | Status: DC | PRN
  Administered 2022-05-30: 80 ug via INTRAVENOUS
  Administered 2022-05-30: 120 ug via INTRAVENOUS
  Administered 2022-05-30: 80 ug via INTRAVENOUS
  Administered 2022-05-30: 160 ug via INTRAVENOUS
  Administered 2022-05-30: 80 ug via INTRAVENOUS

## 2022-05-30 MED ORDER — HEMOSTATIC AGENTS (NO CHARGE) OPTIME
TOPICAL | Status: DC | PRN
  Administered 2022-05-30: 1 via TOPICAL

## 2022-05-30 MED ORDER — ONDANSETRON HCL 4 MG/2ML IJ SOLN
INTRAMUSCULAR | Status: AC
  Filled 2022-05-30: qty 2

## 2022-05-30 MED ORDER — MEPERIDINE HCL 50 MG/ML IJ SOLN: 6.2500 mg | INTRAMUSCULAR | Status: DC | PRN

## 2022-05-30 MED ORDER — PANTOPRAZOLE SODIUM 40 MG PO TBEC
40.0000 mg | DELAYED_RELEASE_TABLET | Freq: Two times a day (BID) | ORAL | Status: DC
  Administered 2022-05-30 – 2022-06-03 (×8): 40 mg via ORAL
  Filled 2022-05-30 (×8): qty 1

## 2022-05-30 MED ORDER — LACTATED RINGERS IR SOLN
Status: DC | PRN
  Administered 2022-05-30: 1000 mL

## 2022-05-30 MED ORDER — SODIUM CHLORIDE (PF) 0.9 % IJ SOLN
INTRAMUSCULAR | Status: DC | PRN
  Administered 2022-05-30: 20 mL

## 2022-05-30 MED ORDER — 0.9 % SODIUM CHLORIDE (POUR BTL) OPTIME
TOPICAL | Status: DC | PRN
  Administered 2022-05-30: 1000 mL

## 2022-05-30 MED ORDER — INSULIN ASPART 100 UNIT/ML IJ SOLN: 0.0000 [IU] | Freq: Every day | INTRAMUSCULAR | Status: DC

## 2022-05-30 MED ORDER — FENTANYL CITRATE PF 50 MCG/ML IJ SOSY: 25.0000 ug | PREFILLED_SYRINGE | INTRAMUSCULAR | Status: DC | PRN

## 2022-05-30 MED ORDER — ALBUMIN HUMAN 5 % IV SOLN: INTRAVENOUS | Status: DC | PRN

## 2022-05-30 MED ORDER — FENTANYL CITRATE (PF) 100 MCG/2ML IJ SOLN
INTRAMUSCULAR | Status: DC | PRN
  Administered 2022-05-30: 100 ug via INTRAVENOUS
  Administered 2022-05-30 (×3): 50 ug via INTRAVENOUS

## 2022-05-30 MED ORDER — LIDOCAINE 2% (20 MG/ML) 5 ML SYRINGE
INTRAMUSCULAR | Status: AC
  Filled 2022-05-30: qty 5

## 2022-05-30 MED ORDER — ROCURONIUM BROMIDE 10 MG/ML (PF) SYRINGE
PREFILLED_SYRINGE | INTRAVENOUS | Status: DC | PRN
  Administered 2022-05-30: 10 mg via INTRAVENOUS
  Administered 2022-05-30: 100 mg via INTRAVENOUS
  Administered 2022-05-30: 30 mg via INTRAVENOUS
  Administered 2022-05-30: 20 mg via INTRAVENOUS

## 2022-05-30 MED ORDER — LATANOPROST 0.005 % OP SOLN
1.0000 [drp] | Freq: Every day | OPHTHALMIC | Status: DC
Start: 2022-05-30 — End: 2022-06-03
  Administered 2022-05-31 – 2022-06-01 (×2): 1 [drp] via OPHTHALMIC
  Filled 2022-05-30: qty 2.5

## 2022-05-30 MED ORDER — HYDROMORPHONE HCL 1 MG/ML IJ SOLN
INTRAMUSCULAR | Status: DC | PRN
  Administered 2022-05-30 (×4): .5 mg via INTRAVENOUS

## 2022-05-30 MED ORDER — HYDROMORPHONE HCL 2 MG/ML IJ SOLN
INTRAMUSCULAR | Status: AC
  Filled 2022-05-30: qty 1

## 2022-05-30 MED ORDER — CEFAZOLIN SODIUM-DEXTROSE 2-4 GM/100ML-% IV SOLN
2.0000 g | Freq: Two times a day (BID) | INTRAVENOUS | Status: DC
  Administered 2022-05-30 – 2022-05-31 (×2): 2 g via INTRAVENOUS
  Filled 2022-05-30 (×3): qty 100

## 2022-05-30 MED ORDER — INSULIN ASPART 100 UNIT/ML IJ SOLN
0.0000 [IU] | Freq: Three times a day (TID) | INTRAMUSCULAR | Status: DC
  Administered 2022-05-30: 3 [IU] via SUBCUTANEOUS
  Administered 2022-05-31 – 2022-06-01 (×5): 2 [IU] via SUBCUTANEOUS
  Administered 2022-06-01 – 2022-06-02 (×3): 3 [IU] via SUBCUTANEOUS
  Administered 2022-06-02 – 2022-06-03 (×2): 2 [IU] via SUBCUTANEOUS

## 2022-05-30 MED ORDER — CEFAZOLIN SODIUM-DEXTROSE 2-4 GM/100ML-% IV SOLN
2.0000 g | Freq: Once | INTRAVENOUS | Status: AC
  Administered 2022-05-30: 2 g via INTRAVENOUS
  Filled 2022-05-30: qty 100

## 2022-05-30 MED ORDER — ORAL CARE MOUTH RINSE: 15.0000 mL | Freq: Once | OROMUCOSAL | Status: AC

## 2022-05-30 MED ORDER — EPHEDRINE 5 MG/ML INJ
INTRAVENOUS | Status: AC
  Filled 2022-05-30: qty 5

## 2022-05-30 MED ORDER — LIDOCAINE 2% (20 MG/ML) 5 ML SYRINGE
INTRAMUSCULAR | Status: DC | PRN
  Administered 2022-05-30: 60 mg via INTRAVENOUS

## 2022-05-30 MED ORDER — PROPOFOL 10 MG/ML IV BOLUS
INTRAVENOUS | Status: AC
  Filled 2022-05-30: qty 20

## 2022-05-30 MED ORDER — CHLORHEXIDINE GLUCONATE 0.12 % MT SOLN
15.0000 mL | Freq: Once | OROMUCOSAL | Status: AC
  Administered 2022-05-30: 15 mL via OROMUCOSAL

## 2022-05-30 MED ORDER — ACETAMINOPHEN 325 MG PO TABS: 325.0000 mg | ORAL_TABLET | ORAL | Status: DC | PRN

## 2022-05-30 MED ORDER — BUPIVACAINE LIPOSOME 1.3 % IJ SUSP
INTRAMUSCULAR | Status: AC
  Filled 2022-05-30: qty 20

## 2022-05-30 MED ORDER — PROPOFOL 10 MG/ML IV BOLUS
INTRAVENOUS | Status: DC | PRN
  Administered 2022-05-30: 100 mg via INTRAVENOUS

## 2022-05-30 MED ORDER — TRAMADOL HCL 50 MG PO TABS
50.0000 mg | ORAL_TABLET | Freq: Four times a day (QID) | ORAL | Status: DC | PRN
  Administered 2022-05-30 – 2022-06-02 (×7): 50 mg via ORAL
  Filled 2022-05-30 (×7): qty 1

## 2022-05-30 MED ORDER — SIMVASTATIN 40 MG PO TABS
40.0000 mg | ORAL_TABLET | Freq: Every day | ORAL | Status: DC
  Administered 2022-05-31 – 2022-06-03 (×4): 40 mg via ORAL
  Filled 2022-05-30 (×4): qty 1

## 2022-05-30 MED ORDER — WATER FOR IRRIGATION, STERILE IR SOLN
Status: DC | PRN
  Administered 2022-05-30: 1000 mL

## 2022-05-30 MED ORDER — ONDANSETRON HCL 4 MG/2ML IJ SOLN
INTRAMUSCULAR | Status: DC | PRN
  Administered 2022-05-30: 4 mg via INTRAVENOUS

## 2022-05-30 MED ORDER — DEXAMETHASONE SODIUM PHOSPHATE 10 MG/ML IJ SOLN
INTRAMUSCULAR | Status: AC
  Filled 2022-05-30: qty 1

## 2022-05-30 SURGICAL SUPPLY — 82 items
BAG COUNTER SPONGE SURGICOUNT (BAG) IMPLANT
BAG LAPAROSCOPIC 12 15 PORT 16 (BASKET) ×2 IMPLANT
BAG RETRIEVAL 12/15 (BASKET) ×2
BAG URO CATCHER STRL LF (MISCELLANEOUS) ×2 IMPLANT
CATH FOLEY 3WAY  5CC 18FR (CATHETERS) ×2
CATH FOLEY 3WAY 5CC 18FR (CATHETERS) IMPLANT
CATH HEMA 3WAY 30CC 24FR COUDE (CATHETERS) IMPLANT
CATH URETL OPEN 5X70 (CATHETERS) ×2 IMPLANT
CHLORAPREP W/TINT 26 (MISCELLANEOUS) ×2 IMPLANT
CLIP LIGATING HEM O LOK PURPLE (MISCELLANEOUS) ×2 IMPLANT
CLIP LIGATING HEMO LOK XL GOLD (MISCELLANEOUS) ×2 IMPLANT
CLIP LIGATING HEMO O LOK GREEN (MISCELLANEOUS) ×2 IMPLANT
CLOTH BEACON ORANGE TIMEOUT ST (SAFETY) ×2 IMPLANT
COVER SURGICAL LIGHT HANDLE (MISCELLANEOUS) ×2 IMPLANT
COVER TIP SHEARS 8 DVNC (MISCELLANEOUS) ×2 IMPLANT
COVER TIP SHEARS 8MM DA VINCI (MISCELLANEOUS) ×2
CUTTER ECHEON FLEX ENDO 45 340 (ENDOMECHANICALS) IMPLANT
DERMABOND ADVANCED (GAUZE/BANDAGES/DRESSINGS) ×4
DERMABOND ADVANCED .7 DNX12 (GAUZE/BANDAGES/DRESSINGS) ×4 IMPLANT
DRAIN CHANNEL 15F RND FF 3/16 (WOUND CARE) ×2 IMPLANT
DRAPE ARM DVNC X/XI (DISPOSABLE) ×8 IMPLANT
DRAPE COLUMN DVNC XI (DISPOSABLE) ×2 IMPLANT
DRAPE DA VINCI XI ARM (DISPOSABLE) ×8
DRAPE DA VINCI XI COLUMN (DISPOSABLE) ×2
DRAPE INCISE IOBAN 66X45 STRL (DRAPES) ×2 IMPLANT
DRAPE SHEET LG 3/4 BI-LAMINATE (DRAPES) ×2 IMPLANT
DRSG TEGADERM 4X4.75 (GAUZE/BANDAGES/DRESSINGS) ×2 IMPLANT
ELECT HOOK LOOP BIPOLAR (NEEDLE) ×2 IMPLANT
ELECT PENCIL ROCKER SW 15FT (MISCELLANEOUS) ×2 IMPLANT
ELECT REM PT RETURN 15FT ADLT (MISCELLANEOUS) ×2 IMPLANT
EVACUATOR SILICONE 100CC (DRAIN) ×2 IMPLANT
GLOVE BIO SURGEON STRL SZ 6.5 (GLOVE) ×2 IMPLANT
GLOVE SURG LX 7.5 STRW (GLOVE) ×4
GLOVE SURG LX STRL 7.5 STRW (GLOVE) ×4 IMPLANT
GOWN STRL REUS W/ TWL LRG LVL3 (GOWN DISPOSABLE) ×6 IMPLANT
GOWN STRL REUS W/ TWL XL LVL3 (GOWN DISPOSABLE) ×2 IMPLANT
GOWN STRL REUS W/TWL LRG LVL3 (GOWN DISPOSABLE) ×6
GOWN STRL REUS W/TWL XL LVL3 (GOWN DISPOSABLE) ×2
GUIDEWIRE ZIPWRE .038 STRAIGHT (WIRE) ×2 IMPLANT
HEMOSTAT SURGICEL 2X3 (HEMOSTASIS) IMPLANT
HEMOSTAT SURGICEL 4X8 (HEMOSTASIS) IMPLANT
IRRIG SUCT STRYKERFLOW 2 WTIP (MISCELLANEOUS) ×2
IRRIGATION SUCT STRKRFLW 2 WTP (MISCELLANEOUS) ×2 IMPLANT
KIT BASIN OR (CUSTOM PROCEDURE TRAY) ×2 IMPLANT
KIT TURNOVER KIT A (KITS) IMPLANT
LOOP CUT BIPOLAR 24F LRG (ELECTROSURGICAL) IMPLANT
MANIFOLD NEPTUNE II (INSTRUMENTS) ×2 IMPLANT
NDL INSUFFLATION 14GA 120MM (NEEDLE) ×2 IMPLANT
NEEDLE INSUFFLATION 14GA 120MM (NEEDLE) ×2 IMPLANT
NS IRRIG 1000ML POUR BTL (IV SOLUTION) ×2 IMPLANT
PACK CYSTO (CUSTOM PROCEDURE TRAY) ×2 IMPLANT
PLUG CATH AND CAP STER (CATHETERS) IMPLANT
PROTECTOR NERVE ULNAR (MISCELLANEOUS) ×4 IMPLANT
RELOAD STAPLE 45 2.6 WHT THIN (STAPLE) IMPLANT
SCISSORS LAP 5X45 EPIX DISP (ENDOMECHANICALS) ×2 IMPLANT
SEAL CANN UNIV 5-8 DVNC XI (MISCELLANEOUS) ×8 IMPLANT
SEAL XI 5MM-8MM UNIVERSAL (MISCELLANEOUS) ×8
SEALER VESSEL DA VINCI XI (MISCELLANEOUS) ×2
SEALER VESSEL EXT DVNC XI (MISCELLANEOUS) IMPLANT
SET IRRIG Y TYPE TUR BLADDER L (SET/KITS/TRAYS/PACK) IMPLANT
SET TUBE SMOKE EVAC HIGH FLOW (TUBING) ×2 IMPLANT
SOLUTION ELECTROLUBE (MISCELLANEOUS) ×2 IMPLANT
SPIKE FLUID TRANSFER (MISCELLANEOUS) ×2 IMPLANT
STAPLE RELOAD 45 WHT (STAPLE) ×6 IMPLANT
STAPLE RELOAD 45MM WHITE (STAPLE) ×6
SUT ETHILON 3 0 PS 1 (SUTURE) ×2 IMPLANT
SUT MNCRL AB 4-0 PS2 18 (SUTURE) ×4 IMPLANT
SUT PDS AB 0 CT1 36 (SUTURE) IMPLANT
SUT V-LOC BARB 180 2/0GR6 GS22 (SUTURE)
SUT VICRYL 0 UR6 27IN ABS (SUTURE) ×2 IMPLANT
SUT VLOC BARB 180 ABS3/0GR12 (SUTURE) ×2
SUTURE V-LC BRB 180 2/0GR6GS22 (SUTURE) IMPLANT
SUTURE VLOC BRB 180 ABS3/0GR12 (SUTURE) IMPLANT
SYR 30ML LL (SYRINGE) IMPLANT
TOWEL OR NON WOVEN STRL DISP B (DISPOSABLE) ×2 IMPLANT
TRAY FOLEY MTR SLVR 16FR STAT (SET/KITS/TRAYS/PACK) ×2 IMPLANT
TRAY LAPAROSCOPIC (CUSTOM PROCEDURE TRAY) ×2 IMPLANT
TROCAR ADV FIXATION 12X100MM (TROCAR) ×2 IMPLANT
TROCAR UNIVERSAL OPT 12M 100M (ENDOMECHANICALS) IMPLANT
TROCAR Z-THREAD OPTICAL 5X100M (TROCAR) ×2 IMPLANT
TUBING CONNECTING 10 (TUBING) ×2 IMPLANT
WATER STERILE IRR 1000ML POUR (IV SOLUTION) ×4 IMPLANT

## 2022-05-30 NOTE — Anesthesia Procedure Notes (Signed)
Procedure Name: Intubation Date/Time: 05/30/2022 7:40 AM  Performed by: Claudia Desanctis, CRNAPre-anesthesia Checklist: Patient identified, Emergency Drugs available, Suction available and Patient being monitored Patient Re-evaluated:Patient Re-evaluated prior to induction Oxygen Delivery Method: Circle system utilized Preoxygenation: Pre-oxygenation with 100% oxygen Induction Type: IV induction Ventilation: Mask ventilation without difficulty Laryngoscope Size: Miller and 3 Grade View: Grade I Tube type: Oral Tube size: 7.5 mm Number of attempts: 1 Airway Equipment and Method: Stylet Placement Confirmation: ETT inserted through vocal cords under direct vision, positive ETCO2 and breath sounds checked- equal and bilateral Secured at: 22 cm Tube secured with: Tape Dental Injury: Teeth and Oropharynx as per pre-operative assessment  Comments: Pt slightly anterior, ist attempt at intubation by EMT student Joe with blue bougie was esophegeal, first look by CRNA with grade I view

## 2022-05-30 NOTE — H&P (Signed)
PRE-OP H&P  Office Visit Report     05/23/2022   --------------------------------------------------------------------------------   Joshua Osborne  MRN: [id number]  DOB: 1941-04-05, 81 year old Male  SSN:    PRIMARY CARE:  Ihor Gully, MD  REFERRING:  Glena [place]. Lovena Neighbours, MD  PROVIDER:  Ellison Hughs, M.D.  TREATING:  Leta Baptist [place], [place]  LOCATION:  [facility] [phone]     --------------------------------------------------------------------------------   CC/HPI: Pt presents today for pre-operative history and physical exam in anticipation of cysto with TUR of left UO, possible TURBT, possible TURP, and robotic assisted left nephroureterectomy by Dr. Lovena Neighbours on 05/30/22. He is doing well except for persistent back/flank pain that has been well controlled with Percocet 5 mg prn.   He was admitted to [facility] 7/14-7/21/23 for septic shock thought to be due to pyleonephritis, AKI, and acute respiratory failure. All cultures were negative but he was successfully treated with Zosyn and Augmentin. During that hospitalization Dr. Alyson Ingles placed a left ureteral stent and performed a TURBT. 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   Pt denies F/C, HA, CP, SOB, N/V, diarrhea/constipation, and dysuria.    HX:   Left UCC   Joshua Osborne is a 81 year old male referred by Dr. 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 Of note, muscle was not obtained from either the left renal pelvis or bladder biopsy. The patient is here today to discuss robotic nephroureterectomy.   -Negative PET CT 03/18/22--liver lesion showed no metabolic activity  -No lesions or abnormalities involving the right kidney  -Sizable prostate on CT  -Hx of lap cholecystectomy 20+ years ago   04/20/22: The patient presents today with left flank pain associated general malaise, fatigue  and chills for the past 10-14 days. His pain is partially alleviated with percocet. From a urinary standpoint he reports tea-colored urine w/o dysuria or gross hematuria. Currently receiving iron-transfusion due to chronic anemia.     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 Has not smoked since 02/07/1992.   Tobacco Use Assessment Completed: Used Tobacco in last 30 days? Does not use smokeless tobacco. Does not drink anymore.  Does not use drugs. Drinks 3 caffeinated drinks per day. Has not had a blood transfusion.    REVIEW OF SYSTEMS:    GU Review Male:   Patient denies frequent urination, hard to postpone urination, burning/ pain with urination,  get up at night to urinate, leakage of urine, stream starts and stops, trouble starting your stream, have to strain to urinate , erection problems, and penile pain.  Gastrointestinal  (Upper):   Patient denies nausea, vomiting, and indigestion/ heartburn.  Gastrointestinal (Lower):   Patient denies diarrhea and constipation.  Constitutional:   Patient denies fever, night sweats, weight loss, and fatigue.  Skin:   Patient denies skin rash/ lesion and itching.  Eyes:   Patient denies blurred vision and double vision.  Ears/ Nose/ Throat:   Patient denies sore throat and sinus problems.  Hematologic/Lymphatic:   Patient denies swollen glands and easy bruising.  Cardiovascular:   Patient denies leg swelling and chest pains.  Respiratory:   Patient denies cough and shortness of breath.  Endocrine:   Patient denies excessive thirst.  Musculoskeletal:   Patient denies back pain and joint pain.  Neurological:   Patient denies headaches and dizziness.  Psychologic:   Patient denies depression and anxiety.   VITAL SIGNS:      05/23/2022 02:46 PM  Weight 168 lb / 76.2 kg  Height 72 in / 182.88 cm  BP 133/65 mmHg  Pulse 97 /min  Temperature 97.0 F / 36.1 C  BMI 22.8 kg/m   MULTI-SYSTEM PHYSICAL EXAMINATION:    Constitutional: Well-nourished. No physical deformities. Normally developed. Good grooming.  Neck: Neck symmetrical, not swollen. Normal tracheal position.  Respiratory: Normal breath sounds. No labored breathing, no use of accessory muscles.   Cardiovascular: Regular rate and rhythm. No murmur, no gallop.   Lymphatic: No enlargement of neck, axillae, groin.  Skin: No paleness, no jaundice, no cyanosis. No lesion, no ulcer, no rash.  Neurologic / Psychiatric: Oriented to time, oriented to place, oriented to person. No depression, no anxiety, no agitation.  Gastrointestinal: No mass, no tenderness, no rigidity, non obese abdomen.  Eyes: Normal conjunctivae. Normal eyelids.  Ears, Nose, Mouth, and Throat: Left ear no scars, no lesions, no masses. Right ear no scars, no lesions, no masses. Nose no scars, no lesions, no masses. Normal hearing. Normal lips.   Musculoskeletal: Normal gait and station of head and neck.     Complexity of Data:  Records Review:   Previous Patient Records  Urine Test Review:   Urinalysis   05/23/22  Urinalysis  Urine Appearance Slightly Cloudy   Urine Color Amber   Urine Glucose Neg mg/dL  Urine Bilirubin Neg mg/dL  Urine Ketones Neg mg/dL  Urine Specific Gravity 1.030   Urine Blood 3+ ery/uL  Urine pH 6.0   Urine Protein 2+ mg/dL  Urine Urobilinogen 0.2 mg/dL  Urine Nitrites Neg   Urine Leukocyte Esterase 3+ leu/uL  Urine WBC/hpf Packed/hpf   Urine RBC/hpf Packed/hpf   Urine Epithelial Cells NS (Not Seen)   Urine Bacteria Many (>50/hpf)   Urine Mucous Not Present   Urine Yeast NS (Not Seen)   Urine Trichomonas Not Present   Urine Cystals NS (Not Seen)   Urine Casts NS (Not Seen)   Urine Sperm Not Present    PROCEDURES:          Urinalysis w/Scope - 81001 Dipstick Dipstick Cont'd Micro  Color: Amber Bilirubin: Neg mg/dL WBC/hpf: Packed/hpf  Appearance: Slightly Cloudy Ketones: Neg mg/dL RBC/hpf: Packed/hpf  Specific Gravity: 1.030 Blood: 3+ ery/uL Bacteria: Many (>50/hpf)  pH: 6.0 Protein: 2+ mg/dL Cystals: NS (Not Seen)  Glucose: Neg mg/dL Urobilinogen: 0.2 mg/dL Casts: NS (Not Seen)    Nitrites: Neg Trichomonas: Not Present    Leukocyte  Esterase: 3+ leu/uL Mucous: Not Present      Epithelial Cells: NS (Not Seen)      Yeast: NS (Not Seen)      Sperm: Not Present    ASSESSMENT:      ICD-10 Details  1 GU:   Bladder Cancer Anterior - C67.3   2   Renal pelvis cancer, 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            Orders Labs Urine Culture          Schedule Return Visit/Planned Activity: Keep Scheduled Appointment - Schedule Surgery          Document Letter(s):  Created for Patient: Clinical Summary         Notes:   Recent changes/updates in the patients history or physical exam have been documented since last evaluation by Dr. WLovena Neighbours Pt is scheduled to undergo cysto with TUR of left UO, possible TURBT, possible TURP, and RAL left nephroureterectomy on 05/30/22.   Send urine for culture and treat empirically with Bactrim due to recent sepsis and hospitalization.   Pt requested additional pain medication for back/flank pain. Percocet 5/'325mg'$  #10 sent to pharm.   All pt's questions were answered to the best of my ability.     The risks, benefits and alternatives of cystoscopy with TUR of the LEFT ureteral orifice, possible TURP, possible TURBT and robot-assisted laparoscopic LEFT nephroureterectomy were discussed in detail including but not limited to: negative pathology, open conversion, infection of the skin/abdominal cavity, VTE, MI/CVA, lymphatic leak, injury to adjacent solid/hollow viscus organs, bleeding requiring a blood transfusion, catastrophic bleeding, bladder perforation hernia formation and other imponderables. The patient voices understanding and wishes to proceed.

## 2022-05-30 NOTE — Anesthesia Postprocedure Evaluation (Signed)
Anesthesia Post Note  Patient: Joshua Osborne  Procedure(s) Performed: XI ROBOT ASSITED LAPAROSCOPIC NEPHROURETERECTOMY (Left) CYSTOSCOPY WITH TRANSURETHRAL RESECTION OF THE LEFT URETERAL ORIFICE,  TRANSURETHRAL RESECTION OF BLADDER TUMOR     Patient location during evaluation: PACU Anesthesia Type: General Level of consciousness: awake and alert Pain management: pain level controlled Vital Signs Assessment: post-procedure vital signs reviewed and stable Respiratory status: spontaneous breathing, nonlabored ventilation, respiratory function stable and patient connected to nasal cannula oxygen Cardiovascular status: blood pressure returned to baseline and stable Postop Assessment: no apparent nausea or vomiting Anesthetic complications: no   No notable events documented.  Last Vitals:  Vitals:   05/30/22 1245 05/30/22 1300  BP: 128/72 110/66  Pulse: (!) 107 (!) 103  Resp: 12 13  Temp:  36.5 C  SpO2: 100% 100%    Last Pain:  Vitals:   05/30/22 1300  TempSrc:   PainSc: 0-No pain                 Shakeira Rhee

## 2022-05-30 NOTE — Op Note (Signed)
Operative Note  Preoperative diagnosis:  1.  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  Cystoscopy with TURBT and TUR of the left ureteral orifice 2.  Robot-assisted laparoscopic left nephro ureterectomy  Surgeon: Ellison Hughs, MD  Assistants:  Lynnae Prude, MD PGY-4   Anesthesia:  General  Complications:  None  EBL: 100 mL  Specimens: 1.  Prostatic urethral lesion 2.  Bladder tumor 3.  Left kidney and ureter 4.  Periaortic lymph nodes  Drains/Catheters: 1.  24 French three-way Foley catheter with 10 mL sterile water in the balloon 2.  Left lower quadrant JP drain  Intraoperative findings:   Cystoscopy revealed a 2 to 3 mm mucosal lesion within the prostatic urethra adjacent to the verumontanum along with multiple 3 to 4 mm superficial papillary bladder tumors scattered throughout the bladder mucosa Enlarged periaortic lymph nodes  Watertight cystorrhaphy  Indication:  Joshua Osborne is a 81 y.o. 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  He has been consented for the above procedures, voices understanding and wishes to proceed.  Description of procedure:  After informed consent was obtained, the patient was brought to the operating room and general endotracheal anesthesia was administered.  The patient was placed in the dorsolithotomy position and prepped and draped in usual sterile fashion.  A timeout was then performed.  A 21 French rigid cystoscope was then advanced into the urethra and up into the bladder.  A complete bladder and urethral survey revealed the findings listed above.  The rigid cystoscope was then exchanged for a 26 French resectoscope with a bipolar loop working element.  The prostatic urethral lesion  was carefully resected and sent to pathology as a permanent section.  The area of resection was then fulgurated until hemostasis was achieved.  The remaining bladder tumors were very superficial and peeled off of the bladder mucosa with minimal manipulation.  I did obtain a biopsy down to the detrusor musculature of one of the bladder tumors and sent that off for permanent section.  The remaining tumors were then bluntly removed from the bladder mucosa and then extensively fulgurated the area that the tumor resided in.  The bipolar loop was then exchanged for a hot [employer].  The left ureteral orifice was then circumferentially transected down to the perivesical fat.  The area was extensively fulgurated until hemostasis was achieved.  An 87 French three-way Foley catheter was then inserted with return of slightly blood-tinged urine.  Shortly after Foley catheter placement his urine became grossly bloody and his catheter became obstructed despite multiple attempts at catheter irrigation.  His 82 French Foley catheter was then exchanged for a 24 [place] hematuria catheter, which readily drained the bladder with an of a few small clots.  The patient was then placed in the right lateral decubitus position and prepped and draped in usual sterile fashion.  A timeout was performed.  An 8 mm incision was then made lateral to the left rectus muscle at the level of the left 12th rib.  Abdominal access was obtained via a Veress needle.  The abdominal cavity was then insufflated up to 15 mmHg.  An 8 mm port was then introduced into the abdominal cavity.  Inspection of the port entry site by the robotic camera revealed  no adjacent organ injury.  We then placed 3 additional 8 mm robotic ports to triangulate the left renal hilum.  A 12 mm assistant port was then placed between the carmera port and 3rd robotic arm.  The white line of Toldt along the descending colon was incised sharply and the colon, along with its  mesocolonic fat, was reflected medially until the aorta was identified.  We then made a small window adjacent to the lower pole of the left kidney, identifying the left psoas muscle, left ureter and left gonadal vein.  The left ureter and gonadal vein were then reflected anteriorly allowing [place] to then incised the perihilar attachments using electrocautery.  We encountered a small lumbar vein adjacent to the insertion of the left gonadal vein into the left renal vein.  This lumbar vein was ligated with hemo-lock clips in 2 places and incised sharply.  This provided [place] excellent exposure to the left renal hilum.    A 45 mm endovascular stapler was then used to ligate the left renal artery and then the left renal vein, achieving excellent hemostasis.  The remaining peri-renal attachments were then excised using a combination of blunt dissection and electrocautery.  The left adrenal gland was not spared.  I then dissected the periureteral attachments down to the bladder hiatus.  A 3-0 V-Loc suture was then placed as a stay suture and the most distal aspects of the left ureter were then removed.  The aperture in the bladder was then reapproximated with the 3-0 V-Loc suture and was found to be watertight after instillation of approximately 300 mL into the bladder through the catheter.  Once the left kidney and ureter were completely free, it was placed in an Endo Catch bag, to be retrieved at the end of the case.  The periaortic lymph nodes were found to be somewhat enlarged and a lymph node dissection was carried out from the renal hilum down to the bifurcation of the iliac vessels in a split and roll fashion.  The robot was then de-docked.  A left lower quadrant Gibson incision was then made and the mass was removed within the Endo Catch bag.  The fascia within the midline assistant port was then closed using an interrupted 0 Vicryl suture.  The fascia of the internal and external oblique was then closed using a 0  PDS suture in a running fashion.  The subcutaneous tissue within the [facility] incision was then closed using a running 0 Vicryl suture.  All skin incisions were then closed using 4-0 Monocryl and then dressed with Dermabond.  The patient tolerated the procedure well and was transferred to the postanesthesia in stable condition.    Plan: Monitor on the floor overnight

## 2022-05-30 NOTE — Transfer of Care (Signed)
Immediate Anesthesia Transfer of Care Note  Patient: Joshua Osborne  Procedure(s) Performed: XI ROBOT ASSITED LAPAROSCOPIC NEPHROURETERECTOMY (Left) CYSTOSCOPY WITH TRANSURETHRAL RESECTION OF THE LEFT URETERAL ORIFICE,  TRANSURETHRAL RESECTION OF BLADDER TUMOR  Patient Location: PACU  Anesthesia Type:General  Level of Consciousness: awake and patient cooperative  Airway & Oxygen Therapy: Patient Spontanous Breathing and Patient connected to face mask  Post-op Assessment: Report given to RN and Post -op Vital signs reviewed and stable  Post vital signs: Reviewed and stable  Last Vitals:  Vitals Value Taken Time  BP 139/72 05/30/22 1207  Temp    Pulse 108 05/30/22 1208  Resp 15 05/30/22 1208  SpO2 100 % 05/30/22 1208  Vitals shown include unvalidated device data.  Last Pain:  Vitals:   05/30/22 0612  TempSrc: Oral  PainSc:          Complications: No notable events documented.

## 2022-05-31 ENCOUNTER — Encounter (HOSPITAL_COMMUNITY): Payer: Self-pay | Admitting: Urology

## 2022-05-31 LAB — CBC
HCT: 21.7 % — ABNORMAL LOW (ref 39.0–52.0)
Hemoglobin: 6.8 g/dL — CL (ref 13.0–17.0)
MCH: 20.8 pg — ABNORMAL LOW (ref 26.0–34.0)
MCHC: 31.3 g/dL (ref 30.0–36.0)
MCV: 66.4 fL — ABNORMAL LOW (ref 80.0–100.0)
Platelets: 342 10*3/uL (ref 150–400)
RBC: 3.27 MIL/uL — ABNORMAL LOW (ref 4.22–5.81)
RDW: 17.8 % — ABNORMAL HIGH (ref 11.5–15.5)
WBC: 20.6 10*3/uL — ABNORMAL HIGH (ref 4.0–10.5)
nRBC: 0 % (ref 0.0–0.2)

## 2022-05-31 LAB — BASIC METABOLIC PANEL
Anion gap: 8 (ref 5–15)
BUN: 27 mg/dL — ABNORMAL HIGH (ref 8–23)
CO2: 24 mmol/L (ref 22–32)
Calcium: 8.7 mg/dL — ABNORMAL LOW (ref 8.9–10.3)
Chloride: 103 mmol/L (ref 98–111)
Creatinine, Ser: 1.6 mg/dL — ABNORMAL HIGH (ref 0.61–1.24)
GFR, Estimated: 43 mL/min — ABNORMAL LOW (ref >60)
Glucose, Bld: 145 mg/dL — ABNORMAL HIGH (ref 70–99)
Potassium: 5.7 mmol/L — ABNORMAL HIGH (ref 3.5–5.1)
Sodium: 135 mmol/L (ref 135–145)

## 2022-05-31 LAB — GLUCOSE, CAPILLARY
Glucose-Capillary: 123 mg/dL — ABNORMAL HIGH (ref 70–99)
Glucose-Capillary: 138 mg/dL — ABNORMAL HIGH (ref 70–99)
Glucose-Capillary: 136 mg/dL — ABNORMAL HIGH (ref 70–99)
Glucose-Capillary: 131 mg/dL — ABNORMAL HIGH (ref 70–99)

## 2022-05-31 LAB — HEMOGLOBIN AND HEMATOCRIT, BLOOD
HCT: 24.1 % — ABNORMAL LOW (ref 39.0–52.0)
Hemoglobin: 7.9 g/dL — ABNORMAL LOW (ref 13.0–17.0)

## 2022-05-31 LAB — PREPARE RBC (CROSSMATCH)

## 2022-05-31 MED ORDER — SODIUM CHLORIDE 0.9% IV SOLUTION: Freq: Once | INTRAVENOUS | Status: AC

## 2022-05-31 MED ORDER — HYDROMORPHONE HCL 1 MG/ML IJ SOLN
0.5000 mg | INTRAMUSCULAR | Status: DC | PRN
  Administered 2022-05-31 – 2022-06-01 (×4): 0.5 mg via INTRAVENOUS
  Filled 2022-05-31 (×4): qty 0.5

## 2022-05-31 MED ORDER — OXYCODONE-ACETAMINOPHEN 7.5-325 MG PO TABS
1.0000 | ORAL_TABLET | ORAL | Status: DC | PRN
  Administered 2022-05-31 – 2022-06-03 (×6): 1 via ORAL
  Filled 2022-05-31 (×6): qty 1

## 2022-05-31 MED ORDER — CEFAZOLIN SODIUM-DEXTROSE 2-4 GM/100ML-% IV SOLN
2.0000 g | Freq: Three times a day (TID) | INTRAVENOUS | Status: DC
  Administered 2022-05-31 – 2022-06-03 (×8): 2 g via INTRAVENOUS
  Filled 2022-05-31 (×9): qty 100

## 2022-05-31 NOTE — Progress Notes (Signed)
1 Day Post-Op Subjective: The patient is doing well.  No nausea or vomiting. Pain is adequately controlled with PRNs. Afebrile and stable. Hgb 6.8 this morning, K 5.7, Cr 1.6. Making adequate urine via foley, JP with 290cc.  Objective: Vital signs in last 24 hours: Temp:  [97.7 F (36.5 C)-99.6 F (37.6 C)] 98.3 F (36.8 C) (08/23 0453) Pulse Rate:  [95-114] 95 (08/23 0453) Resp:  [12-20] 16 (08/23 0453) BP: (95-139)/(60-74) 95/61 (08/23 0453) SpO2:  [96 %-100 %] 96 % (08/23 0453)  Intake/Output from previous day: 08/22 0701 - 08/23 0700 In: 5089.8 [P.O.:580; I.V.:4059.8; IV Piggyback:450] Out: 3015 [Urine:2425; Drains:290; Blood:300] Intake/Output this shift: No intake/output data recorded.  Physical Exam:  General: Alert and oriented. CV: RRR Lungs: Normal work of breathing on room air GI: Soft, Nondistended. Incisions: Clean and dry. Urine: Clear Light pink Extremities: Nontender, no erythema, no edema.  Lab Results: Recent Labs    05/31/22 0407  HGB 6.8*  HCT 21.7*          Recent Labs    05/25/22 1127 05/31/22 0407  CREATININE 1.98* 1.60*           Results for orders placed or performed during the hospital encounter of 05/30/22 (from the past 24 hour(s))  Glucose, capillary     Status: Abnormal   Collection Time: 05/30/22 12:11 PM  Result Value Ref Range   Glucose-Capillary 189 (H) 70 - 99 mg/dL  Glucose, capillary     Status: Abnormal   Collection Time: 05/30/22  4:42 PM  Result Value Ref Range   Glucose-Capillary 166 (H) 70 - 99 mg/dL  Glucose, capillary     Status: Abnormal   Collection Time: 05/30/22  8:32 PM  Result Value Ref Range   Glucose-Capillary 170 (H) 70 - 99 mg/dL  CBC     Status: Abnormal   Collection Time: 05/31/22  4:07 AM  Result Value Ref Range   WBC 20.6 (H) 4.0 - 10.5 K/uL   RBC 3.27 (L) 4.22 - 5.81 MIL/uL   Hemoglobin 6.8 (LL) 13.0 - 17.0 g/dL   HCT 21.7 (L) 39.0 - 52.0 %   MCV 66.4 (L) 80.0 - 100.0 fL   MCH 20.8 (L)  26.0 - 34.0 pg   MCHC 31.3 30.0 - 36.0 g/dL   RDW 17.8 (H) 11.5 - 15.5 %   Platelets 342 150 - 400 K/uL   nRBC 0.0 0.0 - 0.2 %  Basic metabolic panel     Status: Abnormal   Collection Time: 05/31/22  4:07 AM  Result Value Ref Range   Sodium 135 135 - 145 mmol/L   Potassium 5.7 (H) 3.5 - 5.1 mmol/L   Chloride 103 98 - 111 mmol/L   CO2 24 22 - 32 mmol/L   Glucose, Bld 145 (H) 70 - 99 mg/dL   BUN 27 (H) 8 - 23 mg/dL   Creatinine, Ser 1.60 (H) 0.61 - 1.24 mg/dL   Calcium 8.7 (L) 8.9 - 10.3 mg/dL   GFR, Estimated 43 (L) >60 mL/min   Anion gap 8 5 - 15    Assessment/Plan: POD# 1 s/p robotic left nephroureterectomy, TURBT, Foley Catheter Placement, JP drain placement.  1) Ambulate, Incentive spirometry 2) Advance diet as tolerated 3) Transition to oral pain medication as tolerated, ice 4) Transfuse 1u pRBC for post-op anemia 5) EKG for hyperkalemia, shift potassium if EKG changes 6) Continue foley and JP drain at this time. Strict Is/Os.      LOS: 1 day  Josph Macho, MD 05/31/2022, 7:08 AM

## 2022-05-31 NOTE — Progress Notes (Signed)
  Transition of Care [facility]) Screening Note   Patient Details  Name: Joshua Osborne Date of Birth: 1940/10/28   [facility] CM/SW Contact:    Roseanne Kaufman, RN Phone Number: 05/31/2022, 2:43 PM    Transition of Care Department [facility]) has reviewed patient and no TOC needs have been identified at this time. We will continue to monitor patient advancement through interdisciplinary progression rounds. If new patient transition needs arise, please place a TOC consult.

## 2022-05-31 NOTE — Progress Notes (Signed)
Mobility Specialist - Progress Note    05/31/22 1335  Mobility  Activity Ambulated with assistance in hallway  Range of Motion/Exercises Active  Level of Assistance Standby assist, set-up cues, supervision of patient - no hands on  Assistive Device Front wheel walker  Distance Ambulated (ft) 200 ft  Activity Response Tolerated well  $Mobility charge 1 Mobility   Pt was found in bed and agreeable to mobilize. Was left in bed with all necessities in reach and family in room at [place].  Ferd Hibbs Mobility Specialist

## 2022-06-01 LAB — CBC
HCT: 25 % — ABNORMAL LOW (ref 39.0–52.0)
Hemoglobin: 8 g/dL — ABNORMAL LOW (ref 13.0–17.0)
MCH: 22.2 pg — ABNORMAL LOW (ref 26.0–34.0)
MCHC: 32 g/dL (ref 30.0–36.0)
MCV: 69.4 fL — ABNORMAL LOW (ref 80.0–100.0)
Platelets: 287 10*3/uL (ref 150–400)
RBC: 3.6 MIL/uL — ABNORMAL LOW (ref 4.22–5.81)
RDW: 20.7 % — ABNORMAL HIGH (ref 11.5–15.5)
WBC: 17.9 10*3/uL — ABNORMAL HIGH (ref 4.0–10.5)
nRBC: 0 % (ref 0.0–0.2)

## 2022-06-01 LAB — BASIC METABOLIC PANEL
Anion gap: 6 (ref 5–15)
BUN: 24 mg/dL — ABNORMAL HIGH (ref 8–23)
CO2: 26 mmol/L (ref 22–32)
Calcium: 8.6 mg/dL — ABNORMAL LOW (ref 8.9–10.3)
Chloride: 104 mmol/L (ref 98–111)
Creatinine, Ser: 1.77 mg/dL — ABNORMAL HIGH (ref 0.61–1.24)
GFR, Estimated: 38 mL/min — ABNORMAL LOW (ref >60)
Glucose, Bld: 132 mg/dL — ABNORMAL HIGH (ref 70–99)
Potassium: 4.8 mmol/L (ref 3.5–5.1)
Sodium: 136 mmol/L (ref 135–145)

## 2022-06-01 LAB — TYPE AND SCREEN
ABO/RH(D): A POS
Antibody Screen: NEGATIVE
Unit division: 0

## 2022-06-01 LAB — GLUCOSE, CAPILLARY
Glucose-Capillary: 138 mg/dL — ABNORMAL HIGH (ref 70–99)
Glucose-Capillary: 145 mg/dL — ABNORMAL HIGH (ref 70–99)
Glucose-Capillary: 155 mg/dL — ABNORMAL HIGH (ref 70–99)
Glucose-Capillary: 153 mg/dL — ABNORMAL HIGH (ref 70–99)

## 2022-06-01 LAB — BPAM RBC
Blood Product Expiration Date: 202309182359
ISSUE DATE / TIME: 202308231002
Unit Type and Rh: 6200

## 2022-06-01 MED ORDER — HYDROMORPHONE HCL 1 MG/ML IJ SOLN
1.0000 mg | Freq: Once | INTRAMUSCULAR | Status: AC
  Administered 2022-06-01: 1 mg via INTRAVENOUS
  Filled 2022-06-01: qty 1

## 2022-06-01 MED ORDER — HYDROMORPHONE HCL 1 MG/ML IJ SOLN
0.5000 mg | INTRAMUSCULAR | Status: DC | PRN
  Administered 2022-06-01 (×3): 0.5 mg via INTRAVENOUS
  Filled 2022-06-01 (×3): qty 0.5

## 2022-06-01 MED ORDER — CHLORHEXIDINE GLUCONATE CLOTH 2 % EX PADS
6.0000 | MEDICATED_PAD | Freq: Every day | CUTANEOUS | Status: DC
  Administered 2022-06-01 – 2022-06-03 (×3): 6 via TOPICAL

## 2022-06-01 NOTE — Progress Notes (Signed)
Mobility Specialist - Progress Note     06/01/22 0948  Mobility  Activity Ambulated with assistance in hallway  Range of Motion/Exercises Active  Level of Assistance Standby assist, set-up cues, supervision of patient - no hands on  Assistive Device Front wheel walker  Distance Ambulated (ft) 200 ft  Activity Response Tolerated well  $Mobility charge 1 Mobility   Pt was found in recliner chair and agreeable to mobilize. Pt stated feeling discomfort from abdomin area while ambulating. At EOS was left in bed with all necessities in reach and family in room.  Ferd Hibbs Mobility Specialist

## 2022-06-01 NOTE — Plan of Care (Signed)

## 2022-06-01 NOTE — Progress Notes (Addendum)
2 Days Post-Op Subjective: Pain issues overnight secondary to catheter not draining well. RN staff flushed out large clots and catheter now draining. Pain resolved. He is afebrile and stable. Making adequate urine. 135cc from JP over the last 24 hours. Hgb 8.0 s/p 1u pRBC yesterday with appropriate response. Hyperkalemia resolved.  Objective: Vital signs in last 24 hours: Temp:  [98.1 F (36.7 C)-99.2 F (37.3 C)] 99 F (37.2 C) (08/24 0634) Pulse Rate:  [98-118] 106 (08/24 0634) Resp:  [16-20] 17 (08/24 0634) BP: (108-129)/(52-63) 129/60 (08/24 0634) SpO2:  [94 %-99 %] 95 % (08/24 0634)  Intake/Output from previous day: 08/23 0701 - 08/24 0700 In: 1347.2 [P.O.:390; I.V.:442.2; Blood:315; IV Piggyback:200] Out: 2435 [Urine:2300; Drains:135] Intake/Output this shift: No intake/output data recorded.  Physical Exam:  General: Alert and oriented. CV: RRR Lungs: Normal work of breathing on room air GI: Soft, Nondistended. JP serosanguinous Incisions: Clean and dry. Urine: Clear yellow Extremities: Nontender, no erythema, no edema.   Lab Results: Recent Labs    05/31/22 0407 05/31/22 1432 06/01/22 0416  HGB 6.8* 7.9* 8.0*  HCT 21.7* 24.1* 25.0*           Recent Labs    05/25/22 1127 05/31/22 0407 06/01/22 0416  CREATININE 1.98* 1.60* 1.77*            Results for orders placed or performed during the hospital encounter of 05/30/22 (from the past 24 hour(s))  Glucose, capillary     Status: Abnormal   Collection Time: 05/31/22  7:38 AM  Result Value Ref Range   Glucose-Capillary 123 (H) 70 - 99 mg/dL  Prepare RBC (crossmatch)     Status: None   Collection Time: 05/31/22  8:27 AM  Result Value Ref Range   Order Confirmation      ORDER PROCESSED BY BLOOD BANK Performed at [facility] [place] [address], [place], [place] [address]   Glucose, capillary     Status: Abnormal   Collection Time: 05/31/22 11:55 AM  Result Value Ref Range    Glucose-Capillary 138 (H) 70 - 99 mg/dL  Hemoglobin and hematocrit, blood     Status: Abnormal   Collection Time: 05/31/22  2:32 PM  Result Value Ref Range   Hemoglobin 7.9 (L) 13.0 - 17.0 g/dL   HCT 24.1 (L) 39.0 - 52.0 %  Glucose, capillary     Status: Abnormal   Collection Time: 05/31/22  5:26 PM  Result Value Ref Range   Glucose-Capillary 136 (H) 70 - 99 mg/dL  Glucose, capillary     Status: Abnormal   Collection Time: 05/31/22  8:55 PM  Result Value Ref Range   Glucose-Capillary 131 (H) 70 - 99 mg/dL  CBC     Status: Abnormal   Collection Time: 06/01/22  4:16 AM  Result Value Ref Range   WBC 17.9 (H) 4.0 - 10.5 K/uL   RBC 3.60 (L) 4.22 - 5.81 MIL/uL   Hemoglobin 8.0 (L) 13.0 - 17.0 g/dL   HCT 25.0 (L) 39.0 - 52.0 %   MCV 69.4 (L) 80.0 - 100.0 fL   MCH 22.2 (L) 26.0 - 34.0 pg   MCHC 32.0 30.0 - 36.0 g/dL   RDW 20.7 (H) 11.5 - 15.5 %   Platelets 287 150 - 400 K/uL   nRBC 0.0 0.0 - 0.2 %  Basic metabolic panel     Status: Abnormal   Collection Time: 06/01/22  4:16 AM  Result Value Ref Range   Sodium 136 135 - 145  mmol/L   Potassium 4.8 3.5 - 5.1 mmol/L   Chloride 104 98 - 111 mmol/L   CO2 26 22 - 32 mmol/L   Glucose, Bld 132 (H) 70 - 99 mg/dL   BUN 24 (H) 8 - 23 mg/dL   Creatinine, Ser 1.77 (H) 0.61 - 1.24 mg/dL   Calcium 8.6 (L) 8.9 - 10.3 mg/dL   GFR, Estimated 38 (L) >60 mL/min   Anion gap 6 5 - 15  Glucose, capillary     Status: Abnormal   Collection Time: 06/01/22  7:24 AM  Result Value Ref Range   Glucose-Capillary 138 (H) 70 - 99 mg/dL    Assessment/Plan: POD# 1 s/p robotic left nephroureterectomy, TURBT, Foley Catheter Placement, JP drain placement.  1) Ambulate, Incentive spirometry 2) Regular diet 3) Transition to oral pain medication as tolerated, ice 4) Continue foley and JP drain at this time. Strict Is/Os. 5) Continue periop Ancef for (+) preop Urine culture.      LOS: 2 days   Josph Macho, MD 06/01/2022, 7:34 AM

## 2022-06-01 NOTE — Progress Notes (Signed)
Mobility Specialist - Progress Note     06/01/22 1408  Mobility  Activity Ambulated with assistance in hallway  Level of Assistance Standby assist, set-up cues, supervision of patient - no hands on  Assistive Device Front wheel walker  Distance Ambulated (ft) 150 ft  Activity Response Tolerated well  $Mobility charge 1 Mobility   Pt was found in bed and agreeable to mobilize. Pt at [place] returned back to bed with all necessities in reach and family in room. RN was also in room.  Ferd Hibbs Mobility Specialist

## 2022-06-02 ENCOUNTER — Other Ambulatory Visit (HOSPITAL_COMMUNITY): Payer: MEDICARE

## 2022-06-02 ENCOUNTER — Encounter (HOSPITAL_COMMUNITY): Payer: Self-pay | Admitting: Hematology

## 2022-06-02 ENCOUNTER — Other Ambulatory Visit (HOSPITAL_COMMUNITY): Payer: Self-pay

## 2022-06-02 LAB — BASIC METABOLIC PANEL
Anion gap: 7 (ref 5–15)
BUN: 20 mg/dL (ref 8–23)
CO2: 26 mmol/L (ref 22–32)
Calcium: 8.7 mg/dL — ABNORMAL LOW (ref 8.9–10.3)
Chloride: 101 mmol/L (ref 98–111)
Creatinine, Ser: 1.39 mg/dL — ABNORMAL HIGH (ref 0.61–1.24)
GFR, Estimated: 51 mL/min — ABNORMAL LOW (ref >60)
Glucose, Bld: 150 mg/dL — ABNORMAL HIGH (ref 70–99)
Potassium: 4.5 mmol/L (ref 3.5–5.1)
Sodium: 134 mmol/L — ABNORMAL LOW (ref 135–145)

## 2022-06-02 LAB — CBC
HCT: 25.3 % — ABNORMAL LOW (ref 39.0–52.0)
Hemoglobin: 8.1 g/dL — ABNORMAL LOW (ref 13.0–17.0)
MCH: 22.5 pg — ABNORMAL LOW (ref 26.0–34.0)
MCHC: 32 g/dL (ref 30.0–36.0)
MCV: 70.3 fL — ABNORMAL LOW (ref 80.0–100.0)
Platelets: 264 10*3/uL (ref 150–400)
RBC: 3.6 MIL/uL — ABNORMAL LOW (ref 4.22–5.81)
RDW: 21.2 % — ABNORMAL HIGH (ref 11.5–15.5)
WBC: 20.5 10*3/uL — ABNORMAL HIGH (ref 4.0–10.5)
nRBC: 0 % (ref 0.0–0.2)

## 2022-06-02 LAB — GLUCOSE, CAPILLARY
Glucose-Capillary: 143 mg/dL — ABNORMAL HIGH (ref 70–99)
Glucose-Capillary: 190 mg/dL — ABNORMAL HIGH (ref 70–99)
Glucose-Capillary: 156 mg/dL — ABNORMAL HIGH (ref 70–99)
Glucose-Capillary: 143 mg/dL — ABNORMAL HIGH (ref 70–99)
Glucose-Capillary: 147 mg/dL — ABNORMAL HIGH (ref 70–99)

## 2022-06-02 LAB — SURGICAL PATHOLOGY

## 2022-06-02 MED ORDER — PHENAZOPYRIDINE HCL 200 MG PO TABS
200.0000 mg | ORAL_TABLET | Freq: Three times a day (TID) | ORAL | 0 refills | Status: DC | PRN
  Filled 2022-06-02: qty 30, 10d supply, fill #0

## 2022-06-02 MED ORDER — OXYCODONE-ACETAMINOPHEN 7.5-325 MG PO TABS
1.0000 | ORAL_TABLET | ORAL | 0 refills | Status: DC | PRN
  Filled 2022-06-02: qty 30, 5d supply, fill #0

## 2022-06-02 MED ORDER — DOCUSATE SODIUM 100 MG PO CAPS
100.0000 mg | ORAL_CAPSULE | Freq: Two times a day (BID) | ORAL | 0 refills | Status: DC | PRN
  Filled 2022-06-02: qty 30, 15d supply, fill #0

## 2022-06-02 MED ORDER — OXYBUTYNIN CHLORIDE 5 MG PO TABS
5.0000 mg | ORAL_TABLET | Freq: Three times a day (TID) | ORAL | Status: DC | PRN
  Administered 2022-06-02: 5 mg via ORAL
  Filled 2022-06-02: qty 1

## 2022-06-02 MED ORDER — ONDANSETRON HCL 4 MG PO TABS
4.0000 mg | ORAL_TABLET | Freq: Every day | ORAL | 1 refills | Status: DC | PRN
  Filled 2022-06-02: qty 30, 30d supply, fill #0

## 2022-06-02 MED ORDER — OXYBUTYNIN CHLORIDE 5 MG PO TABS
5.0000 mg | ORAL_TABLET | Freq: Three times a day (TID) | ORAL | 1 refills | Status: DC | PRN
  Filled 2022-06-02: qty 30, 10d supply, fill #0

## 2022-06-02 NOTE — Progress Notes (Signed)
Mobility Specialist - Progress Note   06/02/22 1300  Mobility  Activity Ambulated with assistance in hallway  Level of Assistance Standby assist, set-up cues, supervision of patient - no hands on  Assistive Device Front wheel walker  Distance Ambulated (ft) 200 ft  Activity Response Tolerated well  $Mobility charge 1 Mobility   Pt received in bed and agreed for mobility. No c/o pain nor discomfort during ambulation. Pt left in bed with family in room and all necessities met.    Roderick Pee Mobility Specialist

## 2022-06-02 NOTE — Plan of Care (Signed)

## 2022-06-02 NOTE — Discharge Instructions (Signed)

## 2022-06-02 NOTE — Discharge Summary (Signed)
Date of admission: 05/30/2022  Date of discharge: 06/05/2022  Admission diagnosis:  1.  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 Briefly, Joshua Osborne is a 81 y.o. year old patient with high grade urothelial carcinoma of the left renal pelvis and bladder.   Hospital Course: The patient underwent cystoscopy with TURBT and robotic assisted laparoscopic left nephrouretectomy with foley catheter placement on 05/30/2022. The procedure was uncomplicated and once stable, he was transferred to the floor. On POD1, he was found to have a Hgb of < 7 and was transfused 1 unit of packed red blood cells with appropriate response. His diet was slowly advanced as tolerated and he was transitioned to oral pain medications.  On 06/02/2022, his JP drain was removed.  By discharge, he was ambulating without difficulty, making adequate urine, pain was controlled with oral pain medications and he was ambulating without difficulty. He was discharged on 06/03/2022. He will follow up with [facility] for catheter removal and post-op check.  Laboratory values:  Recent Labs    06/03/22 0504  HGB 7.6*  HCT 24.0*   Recent Labs    06/03/22 0504  CREATININE 1.34*   Physical Exam:  General: Alert and oriented. CV: Regular rate Lungs: Normal work of breathing on room air GI: Soft, Nondistended.  Incisions: Clean and dry. Urine: Clear yellow via foley  Extremities: Nontender, no erythema, no edema.  Disposition: Home  Discharge instruction: The patient was instructed to be ambulatory but told to refrain from heavy lifting, strenuous activity, or driving.   Discharge medications:  Allergies as of 06/03/2022   No Known Allergies      Medication List     STOP taking these  medications    oxyCODONE-acetaminophen 5-325 MG tablet Commonly known as: Percocet Replaced by: oxyCODONE-acetaminophen 7.5-325 MG tablet       TAKE these medications    citalopram 10 MG tablet Commonly known as: CELEXA Take 10 mg by mouth daily.   diclofenac 75 MG EC tablet Commonly known as: VOLTAREN Take 75 mg by mouth 2 (two) times daily.   docusate sodium 100 MG capsule Commonly known as: Colace Take 1 capsule (100 mg total) by mouth 2 (two) times daily as needed for mild constipation.   fluorouracil 5 % cream Commonly known as: EFUDEX Apply 1 Application topically 2 (two) times daily as needed (cancer spots).   latanoprost 0.005 % ophthalmic solution Commonly known as: XALATAN Place 1 drop into both eyes at bedtime.   metFORMIN 500 MG tablet Commonly known as: GLUCOPHAGE Take 500 mg by mouth 2 (two) times daily with a meal.   mirabegron ER 25 MG Tb24 tablet Commonly known as: MYRBETRIQ Take 1 tablet (25 mg total) by mouth daily.   MULTI-VITAMIN DAILY PO Take 1 capsule by mouth daily.   niacin 500 MG tablet Commonly known as: VITAMIN B3 Take 500 mg by mouth at bedtime.   omeprazole 20 MG capsule Commonly known as: PRILOSEC Take 20 mg by mouth in the morning and at bedtime.   ondansetron 4 MG tablet Commonly known as: Zofran Take 1 tablet (4 mg total) by mouth daily as needed for nausea or vomiting.   oxybutynin 5 MG tablet Commonly known as: DITROPAN Take 1 tablet (5 mg total) by mouth every 8 (eight) hours as needed  for bladder spasms.   oxyCODONE-acetaminophen 7.5-325 MG tablet Commonly known as: PERCOCET Take 1 tablet by mouth every 4 (four) hours as needed for moderate pain. Replaces: oxyCODONE-acetaminophen 5-325 MG tablet   phenazopyridine 200 MG tablet Commonly known as: Pyridium Take 1 tablet (200 mg total) by mouth 3 (three) times daily as needed (for pain with urination).   saw palmetto 500 MG capsule Take 500 mg by mouth 2 (two)  times daily.   simvastatin 40 MG tablet Commonly known as: ZOCOR Take 40 mg by mouth daily.   tamsulosin 0.4 MG Caps capsule Commonly known as: FLOMAX Take 0.4 mg by mouth at bedtime.   tizanidine 2 MG capsule Commonly known as: ZANAFLEX Take 2 mg by mouth at bedtime.        Followup:   Follow-up Information     Ceasar Mons, MD. Go on 06/07/2022.   Specialty: Urology Why: Postop appointment at 8 AM.  My office will call to confirm the date and time. Contact information: [address] [place] [address] [phone]

## 2022-06-02 NOTE — Progress Notes (Signed)
Mobility Specialist - Progress Note     06/02/22 1032  Mobility  Activity Ambulated with assistance in hallway  Range of Motion/Exercises Active  Level of Assistance Standby assist, set-up cues, supervision of patient - no hands on  Assistive Device Front wheel walker  Distance Ambulated (ft) 200 ft  Activity Response Tolerated well  $Mobility charge 1 Mobility   Pt was found in bed and was agreeable to mobilize. Pt stated feeling better than the past couple of days and just needed cues to help with posture. At EOS returned back to bed with all necessities in reach and family in room.  Ferd Hibbs Mobility Specialist

## 2022-06-02 NOTE — Progress Notes (Signed)
3 Days Post-Op Subjective: No acute events overnight.  Renal function improving.  H&H is stable.  Still having intermittent episodes of pain and requiring Percocet.  He is also complaining of intermittent bladder spasms.  He denies nausea or vomiting and is tolerating a regular diet.  Passing flatus.  JP output significantly diminished.  Urine remains clear.  Objective: Vital signs in last 24 hours: Temp:  [98.4 F (36.9 C)-99 F (37.2 C)] 98.9 F (37.2 C) (08/25 1314) Pulse Rate:  [101-110] 110 (08/25 1314) Resp:  [20] 20 (08/25 1314) BP: (99-143)/(57-66) 99/57 (08/25 1314) SpO2:  [95 %-96 %] 96 % (08/25 1314)  Intake/Output from previous day: 08/24 0701 - 08/25 0700 In: 700 [P.O.:400; IV Piggyback:300] Out: 1470 [Urine:1400; Drains:70]  Intake/Output this shift: Total I/O In: 300 [P.O.:300] Out: -   Physical Exam:  General: Alert and oriented CV: RRR, palpable distal pulses Lungs: CTAB, equal chest rise Abdomen: Soft, NTND, no rebound or guarding Incisions: Clean, dry and intact Gu: Foley catheter in place draining clear-yellow urine.  JP drain with serosanguineous output Ext: NT, No erythema  Lab Results: Recent Labs    05/31/22 1432 06/01/22 0416 06/02/22 0724  HGB 7.9* 8.0* 8.1*  HCT 24.1* 25.0* 25.3*   BMET Recent Labs    06/01/22 0416 06/02/22 0724  NA 136 134*  K 4.8 4.5  CL 104 101  CO2 26 26  GLUCOSE 132* 150*  BUN 24* 20  CREATININE 1.77* 1.39*  CALCIUM 8.6* 8.7*     Studies/Results: No results found.  Assessment/Plan: 81 year old male with pT3a N1 M0 urothelial carcinoma involving the left kidney, status post left robotic nephro ureterectomy and TURBT on 05/29/2022.  -Bladder and kidney pathology results discussed with the patient. -Remove JP drain -Keep Foley catheter in place until follow-up next Wednesday -Out of bed to chair and ambulate -The patient is very reluctant to go home due to pain and fatigue.  He would like to stay 1 more  night and be reassessed in the morning.  Follow-up labs have been ordered.   LOS: 3 days   Ellison Hughs, MD [facility] Pager: [phone]  06/02/2022, 1:31 PM

## 2022-06-03 LAB — BASIC METABOLIC PANEL
Anion gap: 9 (ref 5–15)
BUN: 18 mg/dL (ref 8–23)
CO2: 27 mmol/L (ref 22–32)
Calcium: 8.6 mg/dL — ABNORMAL LOW (ref 8.9–10.3)
Chloride: 101 mmol/L (ref 98–111)
Creatinine, Ser: 1.34 mg/dL — ABNORMAL HIGH (ref 0.61–1.24)
GFR, Estimated: 54 mL/min — ABNORMAL LOW (ref >60)
Glucose, Bld: 132 mg/dL — ABNORMAL HIGH (ref 70–99)
Potassium: 3.9 mmol/L (ref 3.5–5.1)
Sodium: 137 mmol/L (ref 135–145)

## 2022-06-03 LAB — CBC
HCT: 24 % — ABNORMAL LOW (ref 39.0–52.0)
Hemoglobin: 7.6 g/dL — ABNORMAL LOW (ref 13.0–17.0)
MCH: 22 pg — ABNORMAL LOW (ref 26.0–34.0)
MCHC: 31.7 g/dL (ref 30.0–36.0)
MCV: 69.4 fL — ABNORMAL LOW (ref 80.0–100.0)
Platelets: 315 10*3/uL (ref 150–400)
RBC: 3.46 MIL/uL — ABNORMAL LOW (ref 4.22–5.81)
RDW: 21 % — ABNORMAL HIGH (ref 11.5–15.5)
WBC: 16.6 10*3/uL — ABNORMAL HIGH (ref 4.0–10.5)
nRBC: 0 % (ref 0.0–0.2)

## 2022-06-03 LAB — GLUCOSE, CAPILLARY: Glucose-Capillary: 137 mg/dL — ABNORMAL HIGH (ref 70–99)

## 2022-06-03 NOTE — Progress Notes (Signed)
Pt's IV removed, cathter intact. Pt and family understands d/c instructions and have no additional questions at this time. Pt has all belongings and d/c with foley per order. Pt asked if he could also have urinary leg bag to use at home in addition to the current foley bag he currently has. Pt given urinary leg bag to take home per pt request. Pt d/c via wheelchair.

## 2022-06-13 ENCOUNTER — Telehealth: Payer: Self-pay | Admitting: Oncology

## 2022-06-13 NOTE — Telephone Encounter (Signed)
Scheduled appt per 8/30 referral. Pt had requested to transfer care to Dr. Alen Blew at [facility].  Pt's wife is aware of appt date and time. Pt's wife is aware to arrive 15 mins prior to appt time and to bring and updated insurance card. Pt's wife is aware of appt location.

## 2022-06-15 ENCOUNTER — Ambulatory Visit (HOSPITAL_COMMUNITY): Payer: MEDICARE | Admitting: Hematology

## 2022-06-29 ENCOUNTER — Inpatient Hospital Stay: Payer: MEDICARE | Attending: Oncology | Admitting: Oncology

## 2022-06-29 ENCOUNTER — Other Ambulatory Visit: Payer: Self-pay

## 2022-06-29 ENCOUNTER — Other Ambulatory Visit: Payer: Self-pay | Admitting: *Deleted

## 2022-06-29 ENCOUNTER — Other Ambulatory Visit: Payer: Self-pay | Admitting: Oncology

## 2022-06-29 ENCOUNTER — Telehealth: Payer: Self-pay | Admitting: *Deleted

## 2022-06-29 VITALS — BP 113/58 | HR 84 | Temp 98.2°F | Resp 18 | Wt 163.1 lb

## 2022-06-29 DIAGNOSIS — D649 Anemia, unspecified: Secondary | ICD-10-CM | POA: Insufficient documentation

## 2022-06-29 DIAGNOSIS — Z87442 Personal history of urinary calculi: Secondary | ICD-10-CM | POA: Insufficient documentation

## 2022-06-29 DIAGNOSIS — D509 Iron deficiency anemia, unspecified: Secondary | ICD-10-CM | POA: Diagnosis not present

## 2022-06-29 DIAGNOSIS — R5383 Other fatigue: Secondary | ICD-10-CM | POA: Insufficient documentation

## 2022-06-29 DIAGNOSIS — Z85828 Personal history of other malignant neoplasm of skin: Secondary | ICD-10-CM | POA: Insufficient documentation

## 2022-06-29 DIAGNOSIS — Z79899 Other long term (current) drug therapy: Secondary | ICD-10-CM | POA: Insufficient documentation

## 2022-06-29 DIAGNOSIS — Z87891 Personal history of nicotine dependence: Secondary | ICD-10-CM | POA: Insufficient documentation

## 2022-06-29 DIAGNOSIS — C652 Malignant neoplasm of left renal pelvis: Secondary | ICD-10-CM | POA: Insufficient documentation

## 2022-06-29 DIAGNOSIS — E119 Type 2 diabetes mellitus without complications: Secondary | ICD-10-CM | POA: Insufficient documentation

## 2022-06-29 DIAGNOSIS — Z9049 Acquired absence of other specified parts of digestive tract: Secondary | ICD-10-CM | POA: Insufficient documentation

## 2022-06-29 NOTE — Telephone Encounter (Signed)
PC to patient, spoke with his wife Rise Paganini, informed her Dr Alen Blew wants the patient to have 2 units of blood tomorrow, patient has lab appointment at 11:00 tomorrow & then infusion at 12:00.  Also informed her Dr Tomie China office at [facility] has been informed he needs a F/U appointment in the next 2-3 weeks & should be contacting them.  She verbalizes understanding.

## 2022-06-29 NOTE — Progress Notes (Signed)
Thank you Reason for the request:    Renal pelvis cancer  HPI: I was asked by Dr. Lovena Neighbours to evaluate Joshua Osborne for the evaluation of left renal pelvis tumor.  He is an 81 year old man with history of diabetes, and leukocytosis has been seen by Dr. Delton Coombes regarding this issue.  He developed a left kidney neoplasm that was evaluated initially with a PET scan in June 2023 which showed a soft tissue mass in the left renal pelvis that is hypermetabolic without any lymph node involvement.  He underwent cystoscopy and left retrograde pyelography and intraoperative fluoroscopy by Dr. Alyson Ingles on April 26, 2022.  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  He subsequently underwent robotic assisted laparoscopic left nephro ureterectomy by Dr. Lovena Neighbours on May 30, 2022.  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  The tumor in the left renal pelvis showed high-grade papillary tumor measuring 7.5 cm indicating T3 disease.  1 out of 2 metastatic lymph nodes were noted.  Postoperatively his creatinine clearance was around 54 cc/min.  Clinically, he reports feeling fatigue and tiredness and overall lethargy since his operation.  He denies any abdominal pain or discomfort.  He denies any hematochezia or melena.  He denies any hematuria.  He does not report any headaches, blurry vision, syncope or seizures. Does not report any fevers, chills or sweats.  Does not report any cough, wheezing or hemoptysis.  Does not report any chest pain, palpitation, orthopnea or leg edema.  Does not report any nausea, vomiting or abdominal pain.  Does not report any constipation or diarrhea.  Does not report any skeletal complaints.    Does not report frequency, urgency or hematuria.  Does not report any skin rashes or lesions. Does not report any heat or cold intolerance.  Does not report any  lymphadenopathy or petechiae.  Does not report any anxiety or depression.  Remaining review of systems is negative.     Past Medical History:  Diagnosis Date   Anemia    Anxiety    Arthritis    Bladder tumor    Cancer ([address])    hx of skin cancer, renal cancer   Diabetes mellitus without complication (HCC)    GERD (gastroesophageal reflux disease)    Heart murmur    as a child   History of kidney stones    Sleep apnea    cpap  :   Past Surgical History:  Procedure Laterality Date   bilateral dupreyen contracture surgery      CHOLECYSTECTOMY     CYSTOSCOPY N/A 05/30/2022   Procedure: CYSTOSCOPY WITH TRANSURETHRAL RESECTION OF THE LEFT URETERAL ORIFICE,  TRANSURETHRAL RESECTION OF BLADDER TUMOR;  Surgeon: Ceasar Mons, MD;  Location: [facility];  Service: Urology;  Laterality: N/A;   CYSTOSCOPY WITH RETROGRADE PYELOGRAM, URETEROSCOPY AND STENT PLACEMENT Left 04/26/2022   Procedure: CYSTOSCOPY WITH RETROGRADE PYELOGRAM, URETEROSCOPY AND STENT PLACEMENT;  Surgeon: Cleon Gustin, MD;  Location: [facility];  Service: Urology;  Laterality: Left;   LIVER BIOPSY     ROBOT ASSITED LAPAROSCOPIC NEPHROURETERECTOMY Left 05/30/2022   Procedure: XI ROBOT ASSITED LAPAROSCOPIC NEPHROURETERECTOMY;  Surgeon: Ceasar Mons, MD;  Location: [facility];  Service: Urology;  Laterality: Left;   TRANSURETHRAL RESECTION OF BLADDER TUMOR  04/26/2022   Procedure: TRANSURETHRAL RESECTION OF BLADDER TUMOR (TURBT);  Surgeon: Cleon Gustin, MD;  Location: [facility];  Service: Urology;;  :  Current Outpatient Medications:    citalopram (CELEXA) 10 MG tablet, Take 10 mg by mouth daily., Disp: , Rfl:    diclofenac (VOLTAREN) 75 MG EC tablet, Take 75 mg by mouth 2 (two) times daily., Disp: , Rfl:    docusate sodium (COLACE) 100 MG capsule, Take 1 capsule (100 mg total) by mouth 2 (two) times daily as needed for mild constipation., Disp: 30 capsule, Rfl: 0   fluorouracil (EFUDEX) 5 % cream,  Apply 1 Application topically 2 (two) times daily as needed (cancer spots)., Disp: , Rfl:    latanoprost (XALATAN) 0.005 % ophthalmic solution, Place 1 drop into both eyes at bedtime., Disp: , Rfl:    metFORMIN (GLUCOPHAGE) 500 MG tablet, Take 500 mg by mouth 2 (two) times daily with a meal., Disp: , Rfl:    mirabegron ER (MYRBETRIQ) 25 MG TB24 tablet, Take 1 tablet (25 mg total) by mouth daily., Disp: 30 tablet, Rfl: 0   Multiple Vitamin (MULTI-VITAMIN DAILY PO), Take 1 capsule by mouth daily., Disp: , Rfl:    niacin 500 MG tablet, Take 500 mg by mouth at bedtime., Disp: , Rfl:    omeprazole (PRILOSEC) 20 MG capsule, Take 20 mg by mouth in the morning and at bedtime., Disp: , Rfl:    ondansetron (ZOFRAN) 4 MG tablet, Take 1 tablet (4 mg total) by mouth daily as needed for nausea or vomiting., Disp: 30 tablet, Rfl: 1   oxybutynin (DITROPAN) 5 MG tablet, Take 1 tablet (5 mg total) by mouth every 8 (eight) hours as needed for bladder spasms., Disp: 30 tablet, Rfl: 1   oxyCODONE-acetaminophen (PERCOCET) 7.5-325 MG tablet, Take 1 tablet by mouth every 4 (four) hours as needed for moderate pain., Disp: 30 tablet, Rfl: 0   phenazopyridine (PYRIDIUM) 200 MG tablet, Take 1 tablet (200 mg total) by mouth 3 (three) times daily as needed (for pain with urination)., Disp: 30 tablet, Rfl: 0   saw palmetto 500 MG capsule, Take 500 mg by mouth 2 (two) times daily. (Patient not taking: Reported on 05/23/2022), Disp: , Rfl:    simvastatin (ZOCOR) 40 MG tablet, Take 40 mg by mouth daily., Disp: , Rfl:    tamsulosin (FLOMAX) 0.4 MG CAPS capsule, Take 0.4 mg by mouth at bedtime., Disp: , Rfl:    tizanidine (ZANAFLEX) 2 MG capsule, Take 2 mg by mouth at bedtime., Disp: , Rfl: :  No Known Allergies:  No family history on file.:   Social History   Socioeconomic History   Marital status: Married    Spouse name: Not on file   Number of children: Not on file   Years of education: Not on file   Highest education  level: Not on file  Occupational History   Not on file  Tobacco Use   Smoking status: Former    Types: Cigarettes   Smokeless tobacco: Never  Vaping Use   Vaping Use: Never used  Substance and Sexual Activity   Alcohol use: Never   Drug use: Never   Sexual activity: Not Currently  Other Topics Concern   Not on file  Social History Narrative   Not on file   Social Determinants of Health   Financial Resource Strain: Not on file  Food Insecurity: Not on file  Transportation Needs: Not on file  Physical Activity: Not on file  Stress: Not on file  Social Connections: Not on file  Intimate Partner Violence: Not on file  :  Pertinent items are noted in HPI.  Exam:  General appearance: alert and cooperative appeared without distress. Head: atraumatic without any abnormalities. Eyes: conjunctivae/corneas clear. PERRL.  Sclera anicteric. Throat: lips, mucosa, and tongue normal; without oral thrush or ulcers. Resp: clear to auscultation bilaterally without rhonchi, wheezes or dullness to percussion. Cardio: regular rate and rhythm, S1, S2 normal, no murmur, click, rub or gallop GI: soft, non-tender; bowel sounds normal; no masses,  no organomegaly Skin: Skin color, texture, turgor normal. No rashes or lesions Lymph nodes: Cervical, supraclavicular, and axillary nodes normal. Neurologic: Grossly normal without any motor, sensory or deep tendon reflexes. Musculoskeletal: No joint deformity or effusion.    Assessment and Plan:   81 year old with:  1.  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  He is status post left nephroureterectomy with 1 out of 2 lymph nodes were positive locally.  He had 0 out of 7 lymph nodes in left para-aortic area.  The natural course of this disease was reviewed at this time.  PET scan obtained in June 2023 did not show evidence of metastatic disease.  Adjuvant platinum therapy is recommended given  his high risk disease recurrence.  His creatinine clearance is borderline for cisplatin based therapy.  Carboplatin has not been studied in perspective adjuvant trials but there is sufficient evidence to suggest the use of carboplatin versus observation at this time.  He has been seen and evaluated by Dr. Delton Coombes I have recommended follow-up with him in the future to discuss adjuvant treatment options.  Given his recent surgery I anticipate he will be ready to receive adjuvant therapy in the next 2 to 4 weeks.  2.  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  Bladder local therapy is recommended at this time with follow-up with Dr. Alyson Ingles and Dr. Lovena Neighbours.  3.  History of leukocytosis: Appears to be reactive in nature and has follow-up with Dr. Delton Coombes regarding this issue.  4.  Anemia: Related to recent malignancy and surgery.  We will arrange for 2 units of packed red cells in the immediate future.  5.  Follow-up: I am happy to see him in the future as needed.  I will defer further oncology treatment to Dr. Delton Coombes  60  minutes were dedicated to this visit. The time was spent on reviewing laboratory data, imaging studies, pathology results, discussing treatment options  and answering questions regarding future plan.     A copy of this consult has been forwarded to the requesting physician.

## 2022-06-30 ENCOUNTER — Inpatient Hospital Stay: Payer: MEDICARE

## 2022-06-30 DIAGNOSIS — D649 Anemia, unspecified: Secondary | ICD-10-CM | POA: Diagnosis not present

## 2022-06-30 DIAGNOSIS — Z87442 Personal history of urinary calculi: Secondary | ICD-10-CM | POA: Diagnosis not present

## 2022-06-30 DIAGNOSIS — R5383 Other fatigue: Secondary | ICD-10-CM | POA: Diagnosis not present

## 2022-06-30 DIAGNOSIS — Z87891 Personal history of nicotine dependence: Secondary | ICD-10-CM | POA: Diagnosis not present

## 2022-06-30 DIAGNOSIS — Z79899 Other long term (current) drug therapy: Secondary | ICD-10-CM | POA: Diagnosis not present

## 2022-06-30 DIAGNOSIS — Z85828 Personal history of other malignant neoplasm of skin: Secondary | ICD-10-CM | POA: Diagnosis not present

## 2022-06-30 DIAGNOSIS — Z9049 Acquired absence of other specified parts of digestive tract: Secondary | ICD-10-CM | POA: Diagnosis not present

## 2022-06-30 DIAGNOSIS — C652 Malignant neoplasm of left renal pelvis: Secondary | ICD-10-CM | POA: Diagnosis present

## 2022-06-30 DIAGNOSIS — E119 Type 2 diabetes mellitus without complications: Secondary | ICD-10-CM | POA: Diagnosis not present

## 2022-06-30 LAB — PREPARE RBC (CROSSMATCH)

## 2022-06-30 LAB — CMP (CANCER CENTER ONLY)
ALT: 8 U/L (ref 0–44)
AST: 10 U/L — ABNORMAL LOW (ref 15–41)
Albumin: 4.1 g/dL (ref 3.5–5.0)
Alkaline Phosphatase: 70 U/L (ref 38–126)
Anion gap: 5 (ref 5–15)
BUN: 32 mg/dL — ABNORMAL HIGH (ref 8–23)
CO2: 28 mmol/L (ref 22–32)
Calcium: 9.4 mg/dL (ref 8.9–10.3)
Chloride: 104 mmol/L (ref 98–111)
Creatinine: 1.3 mg/dL — ABNORMAL HIGH (ref 0.61–1.24)
GFR, Estimated: 56 mL/min — ABNORMAL LOW (ref >60)
Glucose, Bld: 156 mg/dL — ABNORMAL HIGH (ref 70–99)
Potassium: 5.3 mmol/L — ABNORMAL HIGH (ref 3.5–5.1)
Sodium: 137 mmol/L (ref 135–145)
Total Bilirubin: 0.4 mg/dL (ref 0.3–1.2)
Total Protein: 7.1 g/dL (ref 6.5–8.1)

## 2022-06-30 LAB — CBC WITH DIFFERENTIAL (CANCER CENTER ONLY)
Abs Immature Granulocytes: 0.08 10*3/uL — ABNORMAL HIGH (ref 0.00–0.07)
Basophils Absolute: 0 10*3/uL (ref 0.0–0.1)
Basophils Relative: 0 %
Eosinophils Absolute: 0.4 10*3/uL (ref 0.0–0.5)
Eosinophils Relative: 3 %
HCT: 27.6 % — ABNORMAL LOW (ref 39.0–52.0)
Hemoglobin: 8.8 g/dL — ABNORMAL LOW (ref 13.0–17.0)
Immature Granulocytes: 1 %
Lymphocytes Relative: 9 %
Lymphs Abs: 1.2 10*3/uL (ref 0.7–4.0)
MCH: 20.9 pg — ABNORMAL LOW (ref 26.0–34.0)
MCHC: 31.9 g/dL (ref 30.0–36.0)
MCV: 65.6 fL — ABNORMAL LOW (ref 80.0–100.0)
Monocytes Absolute: 0.8 10*3/uL (ref 0.1–1.0)
Monocytes Relative: 6 %
Neutro Abs: 11.1 10*3/uL — ABNORMAL HIGH (ref 1.7–7.7)
Neutrophils Relative %: 81 %
Platelet Count: 363 10*3/uL (ref 150–400)
RBC: 4.21 MIL/uL — ABNORMAL LOW (ref 4.22–5.81)
RDW: 18.8 % — ABNORMAL HIGH (ref 11.5–15.5)
WBC Count: 13.6 10*3/uL — ABNORMAL HIGH (ref 4.0–10.5)
nRBC: 0 % (ref 0.0–0.2)

## 2022-06-30 MED ORDER — SODIUM CHLORIDE 0.9% IV SOLUTION
250.0000 mL | Freq: Once | INTRAVENOUS | Status: AC
  Administered 2022-06-30: 250 mL via INTRAVENOUS

## 2022-06-30 MED ORDER — ACETAMINOPHEN 325 MG PO TABS
650.0000 mg | ORAL_TABLET | Freq: Once | ORAL | Status: AC
  Administered 2022-06-30: 650 mg via ORAL
  Filled 2022-06-30: qty 2

## 2022-06-30 MED ORDER — DIPHENHYDRAMINE HCL 25 MG PO CAPS
25.0000 mg | ORAL_CAPSULE | Freq: Once | ORAL | Status: AC
  Administered 2022-06-30: 25 mg via ORAL
  Filled 2022-06-30: qty 1

## 2022-06-30 NOTE — Patient Instructions (Signed)

## 2022-07-03 ENCOUNTER — Encounter: Payer: MEDICARE | Admitting: Dietician

## 2022-07-03 ENCOUNTER — Ambulatory Visit: Payer: MEDICARE | Admitting: Hematology

## 2022-07-03 ENCOUNTER — Telehealth: Payer: Self-pay

## 2022-07-03 NOTE — Telephone Encounter (Signed)
CSW attempted to contact patient to assess needs.  Spoke with patient's wife, Rise Paganini.  She said her husband will be seeing the oncologist at the [facility] in [place] since they live in [place], [place].  Informed her that Trilby Leaver, CSW, was the contact at [facility].  She expressed no needs at this time.

## 2022-07-04 LAB — BPAM RBC
Blood Product Expiration Date: 202310172359
Blood Product Expiration Date: 202310172359
ISSUE DATE / TIME: 202309221242
Unit Type and Rh: 6200
Unit Type and Rh: 6200

## 2022-07-04 LAB — TYPE AND SCREEN
ABO/RH(D): A POS
Antibody Screen: NEGATIVE
Unit division: 0
Unit division: 0

## 2022-07-12 ENCOUNTER — Ambulatory Visit (HOSPITAL_COMMUNITY): Payer: MEDICARE | Admitting: Hematology

## 2022-07-20 ENCOUNTER — Encounter (HOSPITAL_COMMUNITY): Payer: Self-pay | Admitting: Hematology

## 2022-07-20 ENCOUNTER — Other Ambulatory Visit (HOSPITAL_COMMUNITY): Payer: Self-pay

## 2022-07-24 ENCOUNTER — Inpatient Hospital Stay: Payer: MEDICARE

## 2022-07-24 ENCOUNTER — Inpatient Hospital Stay: Payer: MEDICARE | Attending: Oncology | Admitting: Hematology

## 2022-07-24 ENCOUNTER — Other Ambulatory Visit: Payer: Self-pay | Admitting: *Deleted

## 2022-07-24 VITALS — BP 130/73 | HR 79 | Temp 97.6°F | Resp 18 | Ht 72.0 in | Wt 171.0 lb

## 2022-07-24 DIAGNOSIS — C652 Malignant neoplasm of left renal pelvis: Secondary | ICD-10-CM | POA: Insufficient documentation

## 2022-07-24 DIAGNOSIS — M549 Dorsalgia, unspecified: Secondary | ICD-10-CM | POA: Insufficient documentation

## 2022-07-24 DIAGNOSIS — D72829 Elevated white blood cell count, unspecified: Secondary | ICD-10-CM | POA: Insufficient documentation

## 2022-07-24 DIAGNOSIS — Z905 Acquired absence of kidney: Secondary | ICD-10-CM | POA: Insufficient documentation

## 2022-07-24 DIAGNOSIS — Z85828 Personal history of other malignant neoplasm of skin: Secondary | ICD-10-CM | POA: Insufficient documentation

## 2022-07-24 DIAGNOSIS — D509 Iron deficiency anemia, unspecified: Secondary | ICD-10-CM | POA: Insufficient documentation

## 2022-07-24 DIAGNOSIS — Z87442 Personal history of urinary calculi: Secondary | ICD-10-CM | POA: Insufficient documentation

## 2022-07-24 DIAGNOSIS — Z9049 Acquired absence of other specified parts of digestive tract: Secondary | ICD-10-CM | POA: Insufficient documentation

## 2022-07-24 DIAGNOSIS — Z79899 Other long term (current) drug therapy: Secondary | ICD-10-CM | POA: Diagnosis not present

## 2022-07-24 DIAGNOSIS — Z87891 Personal history of nicotine dependence: Secondary | ICD-10-CM | POA: Diagnosis not present

## 2022-07-24 DIAGNOSIS — R110 Nausea: Secondary | ICD-10-CM | POA: Diagnosis not present

## 2022-07-24 DIAGNOSIS — R319 Hematuria, unspecified: Secondary | ICD-10-CM | POA: Diagnosis not present

## 2022-07-24 DIAGNOSIS — G8929 Other chronic pain: Secondary | ICD-10-CM | POA: Insufficient documentation

## 2022-07-24 LAB — COMPREHENSIVE METABOLIC PANEL
ALT: 16 U/L (ref 0–44)
AST: 15 U/L (ref 15–41)
Albumin: 3.8 g/dL (ref 3.5–5.0)
Alkaline Phosphatase: 66 U/L (ref 38–126)
Anion gap: 7 (ref 5–15)
BUN: 41 mg/dL — ABNORMAL HIGH (ref 8–23)
CO2: 25 mmol/L (ref 22–32)
Calcium: 8.8 mg/dL — ABNORMAL LOW (ref 8.9–10.3)
Chloride: 107 mmol/L (ref 98–111)
Creatinine, Ser: 1.84 mg/dL — ABNORMAL HIGH (ref 0.61–1.24)
GFR, Estimated: 37 mL/min — ABNORMAL LOW (ref >60)
Glucose, Bld: 122 mg/dL — ABNORMAL HIGH (ref 70–99)
Potassium: 4.8 mmol/L (ref 3.5–5.1)
Sodium: 139 mmol/L (ref 135–145)
Total Bilirubin: 0.3 mg/dL (ref 0.3–1.2)
Total Protein: 7.2 g/dL (ref 6.5–8.1)

## 2022-07-24 LAB — CBC WITH DIFFERENTIAL/PLATELET
Abs Immature Granulocytes: 0.1 10*3/uL — ABNORMAL HIGH (ref 0.00–0.07)
Basophils Absolute: 0.1 10*3/uL (ref 0.0–0.1)
Basophils Relative: 1 %
Eosinophils Absolute: 0.5 10*3/uL (ref 0.0–0.5)
Eosinophils Relative: 5 %
HCT: 30.4 % — ABNORMAL LOW (ref 39.0–52.0)
Hemoglobin: 9.4 g/dL — ABNORMAL LOW (ref 13.0–17.0)
Immature Granulocytes: 1 %
Lymphocytes Relative: 18 %
Lymphs Abs: 1.7 10*3/uL (ref 0.7–4.0)
MCH: 21.3 pg — ABNORMAL LOW (ref 26.0–34.0)
MCHC: 30.9 g/dL (ref 30.0–36.0)
MCV: 68.8 fL — ABNORMAL LOW (ref 80.0–100.0)
Monocytes Absolute: 0.7 10*3/uL (ref 0.1–1.0)
Monocytes Relative: 7 %
Neutro Abs: 6.3 10*3/uL (ref 1.7–7.7)
Neutrophils Relative %: 68 %
Platelets: 340 10*3/uL (ref 150–400)
RBC: 4.42 MIL/uL (ref 4.22–5.81)
RDW: 20.6 % — ABNORMAL HIGH (ref 11.5–15.5)
WBC: 9.3 10*3/uL (ref 4.0–10.5)
nRBC: 0 % (ref 0.0–0.2)

## 2022-07-24 LAB — URINALYSIS, ROUTINE W REFLEX MICROSCOPIC
Bilirubin Urine: NEGATIVE
Glucose, UA: NEGATIVE mg/dL
Ketones, ur: NEGATIVE mg/dL
Nitrite: NEGATIVE
Protein, ur: 100 mg/dL — AB
Specific Gravity, Urine: 1.019 (ref 1.005–1.030)
pH: 5 (ref 5.0–8.0)

## 2022-07-24 LAB — FOLATE: Folate: 11 ng/mL (ref >5.9)

## 2022-07-24 LAB — IRON AND TIBC
Iron: 86 ug/dL (ref 45–182)
Saturation Ratios: 35 % (ref 17.9–39.5)
TIBC: 247 ug/dL — ABNORMAL LOW (ref 250–450)
UIBC: 161 ug/dL

## 2022-07-24 LAB — FERRITIN: Ferritin: 202 ng/mL (ref 24–336)

## 2022-07-24 LAB — VITAMIN B12: Vitamin B-12: 458 pg/mL (ref 180–914)

## 2022-07-24 MED ORDER — PROMETHAZINE HCL 12.5 MG PO TABS: 12.5000 mg | ORAL_TABLET | Freq: Every evening | ORAL | 2 refills | Status: DC | PRN

## 2022-07-24 NOTE — Patient Instructions (Signed)
[place] at [facility] Discharge Instructions   You were seen and examined today by Dr. Delton Coombes.  He discussed the results of your biopsy with you. It showed that you had a lymph node positive. This cancer is high risk, which means there is high risk for this cancer coming back. Dr. Raliegh Ip is recommending treatment with chemotherapy. One of the drugs we would give normally in this situation is cisplatin. This drug can be very hard on your kidneys, and last we checked your kidney number was high. This drug may not be an option for you due to that. The other drug we could use to treat is an immunotherapy drug called Opdivo.   We will repeat your lab work today to check your kidney function.  We will also repeat a PET scan prior to doing any of the above.   Return as scheduled.    Thank you for choosing [place] at [facility] to provide your oncology and hematology care.  To afford each patient quality time with our provider, please arrive at least 15 minutes before your scheduled appointment time.   If you have a lab appointment with the [facility] please come in thru the Main Entrance and check in at the main information desk.  You need to re-schedule your appointment should you arrive 10 or more minutes late.  We strive to give you quality time with our providers, and arriving late affects you and other patients whose appointments are after yours.  Also, if you no show three or more times for appointments you may be dismissed from the clinic at the providers discretion.     Again, thank you for choosing [facility].  Our hope is that these requests will decrease the amount of time that you wait before being seen by our physicians.       _____________________________________________________________  Should you have questions after your visit to [facility], please contact our office at [phone] and follow the  prompts.  Our office hours are 8:00 a.m. and 4:30 p.m. Monday - Friday.  Please note that voicemails left after 4:00 p.m. may not be returned until the following business day.  We are closed weekends and major holidays.  You do have access to a nurse 24-7, just call the main number to the clinic [phone] and do not press any options, hold on the line and a nurse will answer the phone.    For prescription refill requests, have your pharmacy contact our office and allow 72 hours.    Due to Covid, you will need to wear a mask upon entering the hospital. If you do not have a mask, a mask will be given to you at the Main Entrance upon arrival. For doctor visits, patients may have 1 support person age 104 or older with them. For treatment visits, patients can not have anyone with them due to social distancing guidelines and our immunocompromised population.

## 2022-07-24 NOTE — Progress Notes (Signed)
[place], [place] [address]   CLINIC:  Medical Oncology/Hematology  PCP:  Earney Mallet, MD [address] 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, a 81 y.o. 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  He underwent left nephrectomy, ureterectomy, bladder tumor biopsy on 05/30/2022.  He has recovered very well.  He reports nausea which is intermittent and improving with Phenergan.  He reports chronic back pain which is worse in the last 10 days.  He had to take some of his narcotics given for surgery.  REVIEW OF SYSTEMS:  Review of Systems  Gastrointestinal:  Positive for nausea.  Genitourinary:  Positive for frequency.   Musculoskeletal:  Positive for back pain.  Psychiatric/Behavioral:  The patient is nervous/anxious.   All other systems reviewed and are negative.   PAST MEDICAL/SURGICAL HISTORY:  Past Medical History:  Diagnosis Date   Anemia    Anxiety    Arthritis    Bladder tumor    Cancer ([place])    hx of skin cancer, renal cancer   Diabetes mellitus without complication (HCC)    GERD (gastroesophageal reflux disease)    Heart murmur    as a child   History of kidney stones    Sleep apnea    cpap   Past Surgical History:  Procedure Laterality Date   bilateral dupreyen contracture surgery      CHOLECYSTECTOMY     CYSTOSCOPY N/A 05/30/2022   Procedure: CYSTOSCOPY WITH TRANSURETHRAL RESECTION OF THE LEFT URETERAL ORIFICE,  TRANSURETHRAL RESECTION OF BLADDER TUMOR;  Surgeon: Ceasar Mons, MD;  Location: [facility];  Service: Urology;  Laterality: N/A;   CYSTOSCOPY WITH RETROGRADE PYELOGRAM, URETEROSCOPY AND STENT PLACEMENT Left  04/26/2022   Procedure: CYSTOSCOPY WITH RETROGRADE PYELOGRAM, URETEROSCOPY AND STENT PLACEMENT;  Surgeon: Cleon Gustin, MD;  Location: [facility];  Service: Urology;  Laterality: Left;   LIVER BIOPSY     ROBOT ASSITED LAPAROSCOPIC NEPHROURETERECTOMY Left 05/30/2022   Procedure: XI ROBOT ASSITED LAPAROSCOPIC NEPHROURETERECTOMY;  Surgeon: Ceasar Mons, MD;  Location: [facility];  Service: Urology;  Laterality: Left;   TRANSURETHRAL RESECTION OF BLADDER TUMOR  04/26/2022   Procedure: TRANSURETHRAL RESECTION OF BLADDER TUMOR (TURBT);  Surgeon: Cleon Gustin, MD;  Location: [facility];  Service: Urology;;    SOCIAL HISTORY:  Social History   Socioeconomic History   Marital status: Married    Spouse name: Not on file   Number of children: Not on file   Years of education: Not on file   Highest education level: Not on file  Occupational History   Not on file  Tobacco Use   Smoking status: Former    Types: Cigarettes   Smokeless tobacco: Never  Vaping Use   Vaping Use: Never used  Substance and Sexual Activity   Alcohol use: Never   Drug use: Never   Sexual activity: Not Currently  Other Topics Concern   Not on file  Social History Narrative   Not on file   Social Determinants of Health   Financial Resource Strain: Not on file  Food Insecurity: Not on file  Transportation Needs: Not on file  Physical Activity: Not on file  Stress: Not on file  Social  Connections: Not on file  Intimate Partner Violence: Not on file    FAMILY HISTORY:  No family history on file.  CURRENT MEDICATIONS:  Current Outpatient Medications  Medication Sig Dispense Refill   ampicillin (PRINCIPEN) 500 MG capsule Take 500 mg by mouth 3 (three) times daily.     citalopram (CELEXA) 10 MG tablet Take 10 mg by mouth daily.     diclofenac (VOLTAREN) 75 MG EC tablet Take 75 mg by mouth 2 (two) times daily.     docusate sodium (COLACE) 100 MG capsule Take 1 capsule (100 mg total) by mouth 2  (two) times daily as needed for mild constipation. 30 capsule 0   fluorouracil (EFUDEX) 5 % cream Apply 1 Application topically 2 (two) times daily as needed (cancer spots).     HYDROcodone-acetaminophen (NORCO/VICODIN) 5-325 MG tablet Take 1 tablet by mouth 2 (two) times daily as needed.     latanoprost (XALATAN) 0.005 % ophthalmic solution Place 1 drop into both eyes at bedtime.     metFORMIN (GLUCOPHAGE) 500 MG tablet Take 500 mg by mouth 2 (two) times daily with a meal.     Multiple Vitamin (MULTI-VITAMIN DAILY PO) Take 1 capsule by mouth daily.     omeprazole (PRILOSEC) 20 MG capsule Take 20 mg by mouth in the morning and at bedtime.     ondansetron (ZOFRAN) 4 MG tablet Take 1 tablet (4 mg total) by mouth daily as needed for nausea or vomiting. 30 tablet 1   ondansetron (ZOFRAN-ODT) 4 MG disintegrating tablet Take 4 mg by mouth every 6 (six) hours as needed.     oxyCODONE-acetaminophen (PERCOCET) 7.5-325 MG tablet Take 1 tablet by mouth every 4 (four) hours as needed for moderate pain. 30 tablet 0   simvastatin (ZOCOR) 40 MG tablet Take 40 mg by mouth daily.     tamsulosin (FLOMAX) 0.4 MG CAPS capsule Take 0.4 mg by mouth at bedtime.     tizanidine (ZANAFLEX) 2 MG capsule Take 2 mg by mouth at bedtime.     promethazine (PHENERGAN) 12.5 MG tablet Take 1 tablet (12.5 mg total) by mouth at bedtime as needed. 30 tablet 2   No current facility-administered medications for this visit.    ALLERGIES:  No Known Allergies  PHYSICAL EXAM:  Performance status (ECOG): 1 - Symptomatic but completely ambulatory  Vitals:   07/24/22 1318  BP: 130/73  Pulse: 79  Resp: 18  Temp: 97.6 F (36.4 C)  SpO2: 100%   Wt Readings from Last 3 Encounters:  07/24/22 171 lb (77.6 kg)  06/29/22 163 lb 1 oz (74 kg)  05/30/22 177 lb (80.3 kg)   Physical Exam Vitals reviewed.  Constitutional:      Appearance: Normal appearance.  Cardiovascular:     Rate and Rhythm: Normal rate and regular rhythm.      Pulses: Normal pulses.     Heart sounds: Normal heart sounds.  Pulmonary:     Effort: Pulmonary effort is normal.     Breath sounds: Normal breath sounds.  Neurological:     General: No focal deficit present.     Mental Status: He is alert and oriented to person, place, and time.  Psychiatric:        Mood and Affect: Mood normal.        Behavior: Behavior normal.     LABORATORY DATA:  I have reviewed the labs as listed.     Latest Ref Rng & Units 07/24/2022    2:25 PM 06/30/2022  11:01 AM 06/03/2022    5:04 AM  CBC  WBC 4.0 - 10.5 K/uL 9.3  13.6  16.6   Hemoglobin 13.0 - 17.0 g/dL 9.4  8.8  7.6   Hematocrit 39.0 - 52.0 % 30.4  27.6  24.0   Platelets 150 - 400 K/uL 340  363  315       Latest Ref Rng & Units 07/24/2022    2:25 PM 06/30/2022   11:01 AM 06/03/2022    5:04 AM  CMP  Glucose 70 - 99 mg/dL 122  156  132   BUN 8 - 23 mg/dL 41  32  18   Creatinine 0.61 - 1.24 mg/dL 1.84  1.30  1.34   Sodium 135 - 145 mmol/L 139  137  137   Potassium 3.5 - 5.1 mmol/L 4.8  5.3  3.9   Chloride 98 - 111 mmol/L 107  104  101   CO2 22 - 32 mmol/L '25  28  27   '$ Calcium 8.9 - 10.3 mg/dL 8.8  9.4  8.6   Total Protein 6.5 - 8.1 g/dL 7.2  7.1    Total Bilirubin 0.3 - 1.2 mg/dL 0.3  0.4    Alkaline Phos 38 - 126 U/L 66  70    AST 15 - 41 U/L 15  10    ALT 0 - 44 U/L 16  8        Component Value Date/Time   RBC 4.42 07/24/2022 1425   MCV 68.8 (L) 07/24/2022 1425   MCH 21.3 (L) 07/24/2022 1425   MCHC 30.9 07/24/2022 1425   RDW 20.6 (H) 07/24/2022 1425   LYMPHSABS 1.7 07/24/2022 1425   MONOABS 0.7 07/24/2022 1425   EOSABS 0.5 07/24/2022 1425   BASOSABS 0.1 07/24/2022 1425    DIAGNOSTIC IMAGING:  I have independently reviewed the scans and discussed with the patient. No results found.   ASSESSMENT:  Neutrophilic leukocytosis and microcytic anemia: - Patient seen at the request of Dr. Macarthur Critchley for evaluation of leukocytosis and anemia. - CBCD (10/31/2021): WBC-15.4 (N 68%, L 16%, M  5%, E 8%, B 1%) Hb-10.3, MCV-61, PLT-452 - BCR/ABL PCR: Negative on 10/31/2021 - CBCD (09/14/2021): WBC-14.6 (elevated ANC and AEC), Hb-10.5, MCV 62, PLT-384 - CBCD (08/29/2021): WBC-13, Hb-11, MCV-65, PLT-395 (elevated ANC and AEC-0.5 - He reports that he has been anemic all his life.  No prior history of blood transfusion.  No fevers or night sweats. - 22 pound weight loss since at least summer 2022.  Reports decreased appetite.  Reports bloating after eating. - In January 2023, he was hospitalized in [place] for bowel obstruction and bladder infection.  Nonsurgical management.  He received IV iron x1. - He had colonoscopy x3, last one in the 5 years negative.    Social/family history: - He lives at home with his wife.  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  Quit smoking 38 years ago. - No family history of leukemia.  Brother had lung cancer.  Maternal grandmother had cancer.  3.  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 - Left nephroureterectomy and lymph node biopsy on 05/30/2022 by Dr. Gilford Rile. - Pathology shows high-grade papillary urothelial carcinoma of the renal pelvis, 7.5 cm, tumor invades through muscularis into the renal parenchyma and peripelvic fat (PT3).  Urothelial carcinoma is  identified at the proximal and mid left ureter vascular, ureteral and all margins of resection are negative for tumor.  Metastatic urothelial carcinoma in 1/2 lymph nodes.  Left para-aortic resection shows 0/7 lymph nodes involved.  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  He had high-grade tumor with 1 lymph node involved. - I have recommended adjuvant platinum  based chemotherapy.  However he is not a candidate for cisplatin. - His options include adjuvant nivolumab for 1 year versus carboplatin and gemcitabine. - If his creatinine is worse, will consider nivolumab in place of chemotherapy. - I have ordered baseline a PET CT scan prior to initiation of therapy.  2.  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  3.  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 - He had received 1 unit PRBC during surgery. - We will repeat his ferritin and iron panel along with CBC today.  4.  Back pain: - He has chronic back pain which is slightly worse in the last 10 days.  Continue hydrocodone as needed.  We will follow-up on the PET scan.  5.  Low-grade urothelial carcinoma of the bladder: - TURBT on 05/30/2022: Small focus of noninvasive high-grade papillary urothelial carcinoma.  Negative for invasive carcinoma. - Continue follow-up with Dr. Lovena Neighbours.  Orders placed this encounter:  Orders Placed This Encounter  Procedures   NM PET Image Restag (PS) Skull Base To Thigh   Basic metabolic panel   CBC with Differential   Comprehensive metabolic panel   Iron and TIBC ([facility] ONLY)   Ferritin   Vitamin B12   Folate   Urinalysis, Routine w reflex microscopic      Derek Jack, MD [place] [phone]

## 2022-08-02 ENCOUNTER — Other Ambulatory Visit: Payer: MEDICARE | Admitting: Urology

## 2022-08-03 ENCOUNTER — Ambulatory Visit (HOSPITAL_COMMUNITY)
Admission: RE | Admit: 2022-08-03 | Discharge: 2022-08-03 | Disposition: A | Discharge status: Home / Self Care | Payer: MEDICARE | Source: Ambulatory Visit | Attending: Hematology | Admitting: Hematology

## 2022-08-03 DIAGNOSIS — C652 Malignant neoplasm of left renal pelvis: Secondary | ICD-10-CM | POA: Diagnosis present

## 2022-08-03 MED ORDER — FLUDEOXYGLUCOSE F - 18 (FDG) INJECTION
8.4800 | Freq: Once | INTRAVENOUS | Status: AC | PRN
  Administered 2022-08-03: 8.48 via INTRAVENOUS

## 2022-08-09 ENCOUNTER — Inpatient Hospital Stay: Payer: MEDICARE | Attending: Oncology | Admitting: Hematology

## 2022-08-09 VITALS — BP 117/59 | HR 72 | Temp 98.3°F | Resp 16 | Ht 72.0 in | Wt 173.3 lb

## 2022-08-09 DIAGNOSIS — Z905 Acquired absence of kidney: Secondary | ICD-10-CM | POA: Diagnosis not present

## 2022-08-09 DIAGNOSIS — R110 Nausea: Secondary | ICD-10-CM | POA: Insufficient documentation

## 2022-08-09 DIAGNOSIS — Z87442 Personal history of urinary calculi: Secondary | ICD-10-CM | POA: Insufficient documentation

## 2022-08-09 DIAGNOSIS — R21 Rash and other nonspecific skin eruption: Secondary | ICD-10-CM | POA: Insufficient documentation

## 2022-08-09 DIAGNOSIS — Z95828 Presence of other vascular implants and grafts: Secondary | ICD-10-CM

## 2022-08-09 DIAGNOSIS — G8929 Other chronic pain: Secondary | ICD-10-CM | POA: Insufficient documentation

## 2022-08-09 DIAGNOSIS — Z85828 Personal history of other malignant neoplasm of skin: Secondary | ICD-10-CM | POA: Diagnosis not present

## 2022-08-09 DIAGNOSIS — N400 Enlarged prostate without lower urinary tract symptoms: Secondary | ICD-10-CM | POA: Insufficient documentation

## 2022-08-09 DIAGNOSIS — C652 Malignant neoplasm of left renal pelvis: Secondary | ICD-10-CM | POA: Diagnosis not present

## 2022-08-09 DIAGNOSIS — R197 Diarrhea, unspecified: Secondary | ICD-10-CM | POA: Diagnosis not present

## 2022-08-09 DIAGNOSIS — C7800 Secondary malignant neoplasm of unspecified lung: Secondary | ICD-10-CM | POA: Insufficient documentation

## 2022-08-09 DIAGNOSIS — Z5112 Encounter for antineoplastic immunotherapy: Secondary | ICD-10-CM | POA: Insufficient documentation

## 2022-08-09 DIAGNOSIS — Z79899 Other long term (current) drug therapy: Secondary | ICD-10-CM | POA: Insufficient documentation

## 2022-08-09 DIAGNOSIS — D509 Iron deficiency anemia, unspecified: Secondary | ICD-10-CM | POA: Diagnosis not present

## 2022-08-09 DIAGNOSIS — M898X9 Other specified disorders of bone, unspecified site: Secondary | ICD-10-CM | POA: Insufficient documentation

## 2022-08-09 DIAGNOSIS — C778 Secondary and unspecified malignant neoplasm of lymph nodes of multiple regions: Secondary | ICD-10-CM | POA: Diagnosis not present

## 2022-08-09 DIAGNOSIS — R319 Hematuria, unspecified: Secondary | ICD-10-CM | POA: Diagnosis not present

## 2022-08-09 DIAGNOSIS — Z87891 Personal history of nicotine dependence: Secondary | ICD-10-CM | POA: Insufficient documentation

## 2022-08-09 DIAGNOSIS — R42 Dizziness and giddiness: Secondary | ICD-10-CM | POA: Diagnosis not present

## 2022-08-09 DIAGNOSIS — M549 Dorsalgia, unspecified: Secondary | ICD-10-CM | POA: Diagnosis not present

## 2022-08-09 DIAGNOSIS — M25559 Pain in unspecified hip: Secondary | ICD-10-CM | POA: Diagnosis not present

## 2022-08-09 DIAGNOSIS — D563 Thalassemia minor: Secondary | ICD-10-CM | POA: Insufficient documentation

## 2022-08-09 DIAGNOSIS — M25569 Pain in unspecified knee: Secondary | ICD-10-CM | POA: Insufficient documentation

## 2022-08-09 DIAGNOSIS — Z9049 Acquired absence of other specified parts of digestive tract: Secondary | ICD-10-CM | POA: Insufficient documentation

## 2022-08-09 DIAGNOSIS — D72828 Other elevated white blood cell count: Secondary | ICD-10-CM | POA: Diagnosis not present

## 2022-08-09 DIAGNOSIS — Z801 Family history of malignant neoplasm of trachea, bronchus and lung: Secondary | ICD-10-CM | POA: Insufficient documentation

## 2022-08-09 MED ORDER — NITROFURANTOIN MONOHYD MACRO 100 MG PO CAPS: 100.0000 mg | ORAL_CAPSULE | Freq: Two times a day (BID) | ORAL | 1 refills | Status: DC

## 2022-08-09 NOTE — Progress Notes (Signed)
START ON PATHWAY REGIMEN - Bladder     A cycle is every 21 days:     Enfortumab vedotin-ejfv      Pembrolizumab   **Always confirm dose/schedule in your pharmacy ordering system**  Patient Characteristics: Advanced/Metastatic Disease, First Line, No Prior Platinum-Based Therapy OR Prior Platinum-Based Therapy and Relapse > 12 Months, Poor Renal Function (CrCl < 60 mL/min), Initial Immunotherapy-based Treatment Preferred Therapeutic Status: Advanced/Metastatic Disease Line of Therapy: First Line Prior Therapy and Relapse Status: No Prior Platinum-Based Therapy Renal Function: Poor Renal Function (CrCl < 60 mL/min) Intent of Therapy: Non-Curative / Palliative Intent, Discussed with Patient 

## 2022-08-09 NOTE — Progress Notes (Signed)
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, a 81 y.o. 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  He is here after having a PET CT scan done on 08/03/2022.  He reports that he has been having maroon-colored urine for the past few days.  He had similar episodes in the past and was treated with amoxicillin which caused improvement but it had come back.  Denies any fevers or chills.  He is accompanied by his wife today.  REVIEW OF SYSTEMS:  Review of Systems  Gastrointestinal:  Positive for nausea.  Genitourinary:  Positive for hematuria.   Musculoskeletal:  Positive for back pain.  Neurological:  Positive for dizziness.  Psychiatric/Behavioral:  The patient is nervous/anxious.   All other systems reviewed and are negative.   PAST MEDICAL/SURGICAL HISTORY:  Past Medical History:  Diagnosis Date   Anemia    Anxiety    Arthritis    Bladder tumor    Cancer ([place])    hx of skin cancer, renal cancer   Diabetes mellitus without complication (HCC)    GERD (gastroesophageal reflux disease)    Heart murmur    as a child   History of kidney stones    Sleep apnea    cpap   Past Surgical History:  Procedure Laterality Date   bilateral dupreyen contracture surgery      CHOLECYSTECTOMY     CYSTOSCOPY N/A 05/30/2022   Procedure: CYSTOSCOPY WITH TRANSURETHRAL RESECTION OF THE LEFT URETERAL ORIFICE,  TRANSURETHRAL RESECTION OF BLADDER TUMOR;  Surgeon: Ceasar Mons, MD;  Location: [facility];  Service: Urology;  Laterality: N/A;   CYSTOSCOPY WITH RETROGRADE PYELOGRAM, URETEROSCOPY AND STENT PLACEMENT Left 04/26/2022   Procedure: CYSTOSCOPY WITH RETROGRADE PYELOGRAM, URETEROSCOPY AND STENT PLACEMENT;  Surgeon: Cleon Gustin, MD;  Location: [facility];  Service: Urology;  Laterality: Left;   LIVER BIOPSY     ROBOT ASSITED LAPAROSCOPIC NEPHROURETERECTOMY Left 05/30/2022   Procedure: XI ROBOT ASSITED LAPAROSCOPIC NEPHROURETERECTOMY;  Surgeon: Ceasar Mons, MD;  Location: [facility];  Service: Urology;  Laterality: Left;   TRANSURETHRAL RESECTION OF BLADDER TUMOR  04/26/2022   Procedure: TRANSURETHRAL RESECTION OF BLADDER TUMOR (TURBT);  Surgeon: Cleon Gustin, MD;  Location: [facility];  Service: Urology;;    SOCIAL HISTORY:  Social History   Socioeconomic History   Marital status: Married    Spouse name: Not on file   Number of children: Not on file   Years of education: Not on file   Highest education level: Not on file  Occupational History   Not on file  Tobacco Use   Smoking status: Former    Types: Cigarettes   Smokeless tobacco: Never  Vaping Use   Vaping Use: Never used  Substance and Sexual Activity   Alcohol use: Never   Drug use: Never   Sexual activity: Not Currently  Other Topics Concern   Not on file  Social History Narrative   Not on file   Social Determinants of Health   Financial Resource Strain:  Not on file  Food Insecurity: Not on file  Transportation Needs: Not on file  Physical Activity: Not on file  Stress: Not on file  Social Connections: Not on file  Intimate Partner Violence: Not on file    FAMILY HISTORY:  No family history on file.  CURRENT MEDICATIONS:  Current Outpatient Medications  Medication Sig Dispense Refill   citalopram (CELEXA) 10 MG tablet Take 10 mg by mouth daily.     diclofenac (VOLTAREN) 75 MG EC tablet Take 75 mg by mouth 2 (two) times daily.     docusate  sodium (COLACE) 100 MG capsule Take 1 capsule (100 mg total) by mouth 2 (two) times daily as needed for mild constipation. 30 capsule 0   fluorouracil (EFUDEX) 5 % cream Apply 1 Application topically 2 (two) times daily as needed (cancer spots).     HYDROcodone-acetaminophen (NORCO/VICODIN) 5-325 MG tablet Take 1 tablet by mouth 2 (two) times daily as needed.     latanoprost (XALATAN) 0.005 % ophthalmic solution Place 1 drop into both eyes at bedtime.     metFORMIN (GLUCOPHAGE) 500 MG tablet Take 500 mg by mouth 2 (two) times daily with a meal.     Multiple Vitamin (MULTI-VITAMIN DAILY PO) Take 1 capsule by mouth daily.     nitrofurantoin, macrocrystal-monohydrate, (MACROBID) 100 MG capsule Take 1 capsule (100 mg total) by mouth 2 (two) times daily. 14 capsule 1   omeprazole (PRILOSEC) 20 MG capsule Take 20 mg by mouth in the morning and at bedtime.     ondansetron (ZOFRAN-ODT) 4 MG disintegrating tablet Take 4 mg by mouth every 6 (six) hours as needed.     oxyCODONE-acetaminophen (PERCOCET) 7.5-325 MG tablet Take 1 tablet by mouth every 4 (four) hours as needed for moderate pain. 30 tablet 0   promethazine (PHENERGAN) 12.5 MG tablet Take 1 tablet (12.5 mg total) by mouth at bedtime as needed. 30 tablet 2   simvastatin (ZOCOR) 40 MG tablet Take 40 mg by mouth daily.     tamsulosin (FLOMAX) 0.4 MG CAPS capsule Take 0.4 mg by mouth at bedtime.     tizanidine (ZANAFLEX) 2 MG capsule Take 2 mg by mouth at bedtime.     No current facility-administered medications for this visit.    ALLERGIES:  No Known Allergies  PHYSICAL EXAM:  Performance status (ECOG): 1 - Symptomatic but completely ambulatory  Vitals:   08/09/22 1533  BP: (!) 117/59  Pulse: 72  Resp: 16  Temp: 98.3 F (36.8 C)   Wt Readings from Last 3 Encounters:  08/09/22 173 lb 4.8 oz (78.6 kg)  07/24/22 171 lb (77.6 kg)  06/29/22 163 lb 1 oz (74 kg)   Physical Exam Vitals reviewed.  Constitutional:      Appearance: Normal  appearance.  Cardiovascular:     Rate and Rhythm: Normal rate and regular rhythm.     Pulses: Normal pulses.     Heart sounds: Normal heart sounds.  Pulmonary:     Effort: Pulmonary effort is normal.     Breath sounds: Normal breath sounds.  Neurological:     General: No focal deficit present.     Mental Status: He is alert and oriented to person, place, and time.  Psychiatric:        Mood and Affect: Mood normal.        Behavior: Behavior normal.     LABORATORY DATA:  I have reviewed the labs as listed.     Latest Ref Rng &  Units 07/24/2022    2:25 PM 06/30/2022   11:01 AM 06/03/2022    5:04 AM  CBC  WBC 4.0 - 10.5 K/uL 9.3  13.6  16.6   Hemoglobin 13.0 - 17.0 g/dL 9.4  8.8  7.6   Hematocrit 39.0 - 52.0 % 30.4  27.6  24.0   Platelets 150 - 400 K/uL 340  363  315       Latest Ref Rng & Units 07/24/2022    2:25 PM 06/30/2022   11:01 AM 06/03/2022    5:04 AM  CMP  Glucose 70 - 99 mg/dL 122  156  132   BUN 8 - 23 mg/dL 41  32  18   Creatinine 0.61 - 1.24 mg/dL 1.84  1.30  1.34   Sodium 135 - 145 mmol/L 139  137  137   Potassium 3.5 - 5.1 mmol/L 4.8  5.3  3.9   Chloride 98 - 111 mmol/L 107  104  101   CO2 22 - 32 mmol/L '25  28  27   '$ Calcium 8.9 - 10.3 mg/dL 8.8  9.4  8.6   Total Protein 6.5 - 8.1 g/dL 7.2  7.1    Total Bilirubin 0.3 - 1.2 mg/dL 0.3  0.4    Alkaline Phos 38 - 126 U/L 66  70    AST 15 - 41 U/L 15  10    ALT 0 - 44 U/L 16  8        Component Value Date/Time   RBC 4.42 07/24/2022 1425   MCV 68.8 (L) 07/24/2022 1425   MCH 21.3 (L) 07/24/2022 1425   MCHC 30.9 07/24/2022 1425   RDW 20.6 (H) 07/24/2022 1425   LYMPHSABS 1.7 07/24/2022 1425   MONOABS 0.7 07/24/2022 1425   EOSABS 0.5 07/24/2022 1425   BASOSABS 0.1 07/24/2022 1425    DIAGNOSTIC IMAGING:  I have independently reviewed the scans and discussed with the patient. NM PET Image Restag (PS) Skull Base To Thigh  Result Date: 08/07/2022 CLINICAL DATA:  Subsequent treatment strategy for invasive  bladder cancer. Renal cell carcinoma. LEFT nephrectomy. EXAM: NUCLEAR MEDICINE PET SKULL BASE TO THIGH TECHNIQUE: 8.48 mCi F-18 FDG was injected intravenously. Full-ring PET imaging was performed from the skull base to thigh after the radiotracer. CT data was obtained and used for attenuation correction and anatomic localization. Fasting blood glucose: 116 COMPARISON:  PET-CT 03/16/2022 FINDINGS: Mediastinal blood pool activity: SUV max 2.3 Liver activity: SUV max NA NECK: New hypermetabolic LEFT supraclavicular node measures 13 mm (image 71/3) with SUV max equal 3.8. Incidental CT findings: None. CHEST: New hypermetabolic mediastinal lymph nodes. For example 13 mm prevascular node with SUV max equal 5.5 (image 93). High RIGHT paratracheal node measuring 12 mm (image 87) with SUV max equal 5.0. High LEFT paratracheal lymph node measuring 10 mm SUV max equal 4.1 on image 80). There several new pulmonary nodules. Example RIGHT lobe nodule measuring 8 mm image 120/3. RIGHT middle lobe nodule along the pleural surface measuring 12 mm image 120/3. RIGHT lower lobe nodule measuring 7 mm on image 128/3. Similar small nodule at the LEFT lung base on image 128. The largest of these nodules in the RIGHT middle lobe has metabolic activity SUV max equal 2.9 on image 119. The other smaller nodules do not have metabolic activity. Incidental CT findings: None. ABDOMEN/PELVIS: Post LEFT nephrectomy. New hypermetabolic nodal tissue LEFT aorta SUV max equal 5.1. No abnormal activity associated with the RIGHT kidney. No hypermetabolic tissue in  the liver. Incidental CT findings: Prostate enlarged SKELETON: No focal hypermetabolic activity to suggest skeletal metastasis. Incidental CT findings: None. IMPRESSION: 1. Unfortunately evidence of new hypermetabolic mediastinal nodal metastasis and LEFT supraclavicular nodal metastasis. 2. Bilateral new small round pulmonary nodules. Largest nodule has metabolic activity. Findings consistent  with new pulmonary metastasis. 3. Post LEFT nephrectomy. Hypermetabolic nodal tissue LEFT of aorta new from prior. 4. No hypermetabolic liver metastasis. Electronically Signed   By: Suzy Bouchard M.D.   On: 08/07/2022 09:47     ASSESSMENT:  Neutrophilic leukocytosis and microcytic anemia: - Patient seen at the request of Dr. Macarthur Critchley for evaluation of leukocytosis and anemia. - CBCD (10/31/2021): WBC-15.4 (N 68%, L 16%, M 5%, E 8%, B 1%) Hb-10.3, MCV-61, PLT-452 - BCR/ABL PCR: Negative on 10/31/2021 - CBCD (09/14/2021): WBC-14.6 (elevated ANC and AEC), Hb-10.5, MCV 62, PLT-384 - CBCD (08/29/2021): WBC-13, Hb-11, MCV-65, PLT-395 (elevated ANC and AEC-0.5 - He reports that he has been anemic all his life.  No prior history of blood transfusion.  No fevers or night sweats. - 22 pound weight loss since at least summer 2022.  Reports decreased appetite.  Reports bloating after eating. - In January 2023, he was hospitalized in [place] for bowel obstruction and bladder infection.  Nonsurgical management.  He received IV iron x1. - He had colonoscopy x3, last one in the 5 years negative.    Social/family history: - He lives at home with his wife.  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  Quit smoking 38 years ago. - No family history of leukemia.  Brother had lung cancer.  Maternal grandmother had cancer.  3.  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 - Left nephroureterectomy and lymph node biopsy on 05/30/2022 by Dr. Gilford Rile. - Pathology shows high-grade papillary urothelial carcinoma of the renal pelvis, 7.5 cm, tumor invades through muscularis into the renal parenchyma and peripelvic fat (PT3).  Urothelial carcinoma is identified at the proximal and mid left  ureter vascular, ureteral and all margins of resection are negative for tumor.  Metastatic urothelial carcinoma in 1/2 lymph nodes.  Left para-aortic resection shows 0/7 lymph nodes involved. - PET scan (08/03/2022): Hypermetabolic left supraclavicular lymph node 13 mm, SUV 3.8.  13 mm prevascular lymph node, SUV three 5.5.  High right paratracheal lymph node 12 mm, SUV 5.0.  I left paratracheal lymph node 10 mm, SUV 4.1.  Right middle lobe nodule 12 mm.  Right lower lobe nodule 7 mm.  7 mm left lung base nodule.  Right middle lobe largest nodule with SUV 2.9.  New hypermetabolic nodal tissue left aorta SUV 5.1.  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  Unfortunately he has developed new hypermetabolic left supraclavicular lymph node, mediastinal lymph node, high right and left paratracheal lymph nodes.  Several new pulmonary nodules largest of this in the right middle lobe has SUV 2.9.  New left para-aortic lymph node.  No skeletal metastasis. - We have reviewed his labs from 07/24/2022 with creatinine 1.84 and GFR of 37 mL/min.  He does not have any autoimmune problems.  No skin problems.  Does not have  any peripheral neuropathy. - He is not eligible for cisplatin based regimen.  He has ECOG 1 performance status.  We discussed options including gemcitabine plus carboplatin plus Avelumab versus nonchemotherapy option of enfortumab vedotin plus pembrolizumab. - We discussed enfortumab vedotin plus pembrolizumab which has higher objective response rates of around 65%.  However OS benefit has not been established yet.  I think this would be a better option for him because of his poor kidney function. - We discussed the common side effects of this regimen including but not limited to skin toxicity, ocular toxicity,  peripheral neuropathy and hyperglycemia.  We also touched on immunotherapy related side effects. - Recommend sending tissue for NGS testing. - We talked about port placement.  We will arrange it as soon as possible through IR. - We will start treatments next week.  I will see him on day 8 of treatment.   2.  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  3.  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 - Latest hemoglobin is 9.4 with MCV 68.  Ferritin is 202, percent saturation 35.  N23 and folic acid normal.  4.  Chronic back pain: - He has chronic back pain with waxing and waning.  Continue hydrocodone as needed.  PET scan did not show any metastatic disease.  5.  Low-grade urothelial carcinoma of the bladder: - TURBT on 05/30/2022: Small focus of noninvasive high-grade papillary urothelial carcinoma.  Negative for invasive carcinoma. - I will reach out to Dr. Lovena Neighbours and inform his new staging of renal pelvis urothelial carcinoma per patient request.  Orders placed this encounter:  Orders Placed This Encounter  Procedures   IR IMAGING GUIDED PORT INSERTION      Derek Jack, [place] [phone]

## 2022-08-09 NOTE — Patient Instructions (Addendum)
[place] at [facility] Discharge Instructions   You were seen and examined today by Dr. Delton Coombes.  He discussed the results of your PET scan. It shows that you have cancer that has spread to the lymph nodes, and there are multiple spots in the lungs that are very small. This makes this an advanced stage, Stage IV, disease.   He discussed treating this with non-chemotherapy options. He recommends treating with a drug called Keytruda and a drug called Padcev. The Beryle Flock is given in the clinic once every 3 weeks. The Padcev is given 2 weeks in a row with one week off, then the cycle repeats. We will give the Padcev for 7-8 cycles then discontinue the Padcev and continue the [facility] every 3 weeks.   We will also send your cancer tissue off for special testing. This testing can give [place] treatment options down the road if the [place] stops working.  We will arrange for you to have a port a cath placed in order to administer these medications. We can also use this port to draw blood work.   We sent a prescription for antibiotic (Macrobid) to your pharmacy. You will take one pill twice a day for 7 days. If this does not completely clear this up, there is one refill. You may refill and repeat another week.      Thank you for choosing [place] at [facility] to provide your oncology and hematology care.  To afford each patient quality time with our provider, please arrive at least 15 minutes before your scheduled appointment time.   If you have a lab appointment with the [facility] please come in thru the Main Entrance and check in at the main information desk.  You need to re-schedule your appointment should you arrive 10 or more minutes late.  We strive to give you quality time with our providers, and arriving late affects you and other patients whose appointments are after yours.  Also, if you no show three or more times for appointments you  may be dismissed from the clinic at the providers discretion.     Again, thank you for choosing [facility].  Our hope is that these requests will decrease the amount of time that you wait before being seen by our physicians.       _____________________________________________________________  Should you have questions after your visit to [facility], please contact our office at [phone] and follow the prompts.  Our office hours are 8:00 a.m. and 4:30 p.m. Monday - Friday.  Please note that voicemails left after 4:00 p.m. may not be returned until the following business day.  We are closed weekends and major holidays.  You do have access to a nurse 24-7, just call the main number to the clinic [phone] and do not press any options, hold on the line and a nurse will answer the phone.    For prescription refill requests, have your pharmacy contact our office and allow 72 hours.    Due to Covid, you will need to wear a mask upon entering the hospital. If you do not have a mask, a mask will be given to you at the Main Entrance upon arrival. For doctor visits, patients may have 1 support person age 27 or older with them. For treatment visits, patients can not have anyone with them due to social distancing guidelines and our immunocompromised population.

## 2022-08-10 ENCOUNTER — Other Ambulatory Visit: Payer: Self-pay

## 2022-08-10 ENCOUNTER — Encounter: Payer: Self-pay | Admitting: Hematology

## 2022-08-10 DIAGNOSIS — C652 Malignant neoplasm of left renal pelvis: Secondary | ICD-10-CM

## 2022-08-10 NOTE — Addendum Note (Signed)
Addended by: Derek Jack on: 08/10/2022 04:32 PM   Modules accepted: Orders

## 2022-08-11 ENCOUNTER — Other Ambulatory Visit: Payer: Self-pay

## 2022-08-12 ENCOUNTER — Other Ambulatory Visit: Payer: Self-pay

## 2022-08-15 ENCOUNTER — Other Ambulatory Visit: Payer: Self-pay

## 2022-08-15 ENCOUNTER — Inpatient Hospital Stay: Payer: MEDICARE

## 2022-08-15 ENCOUNTER — Other Ambulatory Visit: Payer: Self-pay | Admitting: *Deleted

## 2022-08-15 ENCOUNTER — Other Ambulatory Visit (HOSPITAL_COMMUNITY): Payer: Self-pay | Admitting: Student

## 2022-08-15 ENCOUNTER — Other Ambulatory Visit: Payer: Self-pay | Admitting: Radiology

## 2022-08-15 DIAGNOSIS — N390 Urinary tract infection, site not specified: Secondary | ICD-10-CM

## 2022-08-15 MED ORDER — AMOXICILLIN 500 MG PO CAPS: 500.0000 mg | ORAL_CAPSULE | Freq: Three times a day (TID) | ORAL | 0 refills | Status: AC

## 2022-08-15 NOTE — Progress Notes (Signed)
Patient's wife called concerned that, despite antibiotic urine is still tea colored and had not resolved.  Has an appointment to see Dr. Lynden Oxford next week.  Per Dr. Delton Coombes, keep appointment for port placement and treatment this week and send in Amoxicillin 500 mg TID x 7 days.  Wife made aware and states that he is asymptomatic at this time as far as fever or chills.  Verbalized understanding of above.

## 2022-08-15 NOTE — Progress Notes (Signed)
Immunotherapy education packet given and discussed with pt in detail.  Discussed diagnosis, staging, tx regimen, and intent of tx.  Reviewed immunotherapy medications and side effects.  Instructed on how to manage side effects at home, and when to call the clinic.  Importance of fever/chills discussed with pt and family. Discussed precautions to implement at home after receiving tx, as well as self care strategies. Phone numbers provided for clinic during regular working hours, also how to reach the clinic after hours and on weekends. Pt provided the opportunity to ask questions - all questions answered to pt's satisfaction.     

## 2022-08-15 NOTE — Patient Instructions (Addendum)
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 You will be treated in the clinic with an immunotherapy drug called pembrolizumab Beryle Flock) and an antibody conjugate drug called Enfortumab vedotin (Padcev). You will come two weeks in a row and the have one week off before you come back to repeat this cycle. On the first week you will receive both drugs. On the second week, you will receive only Padcev. After 7-8 cycles, we will discontinue the Padcev. At this time, you will come to the clinic once every 3 weeks to receive Keytruda only. The intent of treatment is to control the cancer, prevent it from spreading further, and to alleviate any symptoms you may be having related to the cancer. We will continue this treatment as long as it is helping to keep the disease under control. You will see the doctor regularly throughout treatment.  We will obtain blood work from you prior to every treatment and monitor your results to make sure it is safe to give your treatment. The doctor monitors your response to treatment by the way you are feeling, your blood work, and by obtaining scans periodically.  There will be wait times while you are here for treatment.  It will take about 30 minutes to 1 hour for your lab work to result.  Then there will be wait times while pharmacy mixes your medications.    Pembrolizumab Beryle Flock)  About This Drug Pembrolizumab is used to treat cancer. It is given in the vein (IV).  This drug will take 30 minutes to infuse.  Possible Side Effects  Tiredness  Fever  Nausea  Decreased appetite (decreased hunger)  Loose bowel movements (diarrhea)  Constipation (not able to move bowels)  Trouble breathing  Rash  Itching  Muscle and bone pain  Cough  Note: Each of the side effects above was reported in 20% or greater of patients treated with pembrolizumab. Not all possible  side effects are included above.  Warnings and Precautions   This drug works with your immune system and can cause inflammation in any of your organs and tissues and can change how they work. This may put you at risk for developing serious medical problems which can very rarely be fatal.   Colitis (swelling or inflammation in the colon) - symptoms are loose bowel movements (diarrhea) stomach cramping, and sometimes blood in the stool   Changes in liver function. Your liver function will be checked as needed.   Changes in kidney function, which can very rarely be fatal. Your kidney function will be checked as needed.   Inflammation (swelling) of the lungs which can very rarely be fatal - you may have a dry cough or trouble breathing.   This drug may affect some of your hormone glands (especially the thyroid, adrenals, pituitary and pancreas). Your hormone levels will be checked as needed.   Blood sugar levels may change and you may develop diabetes. If you already have diabetes, changes may need to be made to your diabetes medication.   Severe allergic skin reaction, which can very rarely be fatal. You may develop blisters on your skin that are filled with fluid or a severe red rash all over your body that may be painful.   Increased risk of organ rejection in patients who have received donor organs   Increased risk of complications in patients who will undergo a stem cell transplant after receiving pembrolizumab.  While you are getting this drug in your vein (IV), you may have a reaction to the drug. Your nurse will check you closely for these signs: fever or shaking chills, flushing, facial swelling, feeling dizzy, headache, trouble breathing, rash, itching, chest tightness, or chest pain. These reactions may occur after your infusion. If this happens, call 911 for emergency care.  Important Information This drug may be present in the saliva, tears, sweat, urine, stool, vomit, semen, and  vaginal secretions. Talk to your doctor and/or your nurse about the necessary precautions to take during this time.  Treating Side Effects   Ask your doctor or nurse about medicines that are available to help stop or lessen constipation, diarrhea and/or nausea.   Drink plenty of fluids (a minimum of eight glasses per day is recommended).   If you are not able to move your bowels, check with your doctor or nurse before you use any enemas, laxatives, or suppositories   To help with nausea and vomiting, eat small, frequent meals instead of three large meals a day. Choose foods and drinks that are at room temperature. Ask your nurse or doctor about other helpful tips and medicine that is available to help or stop lessen these symptoms.   If you get diarrhea, eat low-fiber foods that are high in protein and calories and avoid foods that can irritate your digestive tracts or lead to cramping. Ask your nurse or doctor about medicine that can lessen or stop your diarrhea.   Manage tiredness by pacing your activities for the day. Be sure to include periods of rest between energy-draining activities   Keeping your pain under control is important to your wellbeing. Please tell your doctor or nurse if you are experiencing pain.   If you have diabetes, keep good control of your blood sugar level. Tell your nurse or your doctor if your glucose levels are higher or lower than normal   If you get a rash do not put anything on it unless your doctor or nurse says you may. Keep the area around the rash clean and dry. Ask your doctor for medicine if your rash bothers you.   Infusion reactions may happen for 24 hours after your infusion. If this happens, call 911 for emergency care.  Food and Drug Interactions   There are no known interactions of pembrolizumab with food.   There are no known interactions of pembrolizumab with other medications.   Tell your doctor and pharmacist about all the medicines and  dietary supplements (vitamins, minerals, herbs and others) that you are taking at this time. The safety and use of dietary supplements and alternative agents are often not known. Using these might affect your cancer or interfere with your treatment. Until more is known, you should not use dietary supplements or alternative agents without your cancer doctor's help.   When to Call the Doctor Call your doctor or nurse if you have any of the following symptoms and/or any new or unusual symptoms:   Fever of 100.4 F (38 C) or higher   Chills   Wheezing or trouble breathing   Rash or itching   Feeling dizzy or lightheaded   Loose bowel movements (diarrhea) more than 4 times a day or diarrhea with weakness or lightheadedness, or diarrhea that is not controlled by medications   Nausea that stops you from eating or drinking, and/or that is not relieved by prescribed medicines   Lasting loss of appetite or rapid weight loss of five pounds  in a week   Fatigue that interferes with your daily activities   No bowel movement for 3 days or you feel uncomfortable   Extreme weakness that interferes with normal activities   Bad abdominal pain, especially in upper right area   Decreased urine   Unusual thirst or passing urine often   Rash that is not relieved by prescribed medicines   Flu-like symptoms: fever, headache, muscle and joint aches, and fatigue (low energy, feeling weak)   Signs of liver problems: dark urine, pale bowel movements, bad stomach pain, feeling very tired and weak, unusual itching, or yellowing of the eyes or skin   Signs of infusion reactions such as fever or shaking chills, flushing, facial swelling, feeling dizzy, headache, trouble breathing, rash, itching, chest tightness, or chest pain.   Reproduction Warnings   Pregnancy warning: This drug may have harmful effects on the unborn baby. Women of childbearing potential should use effective methods of birth control  during your cancer treatment and for at least 4 months after treatment. Let your doctor know right away if you think you may be pregnant   Breast feeding warning: It is not known if this drug passes into breast milk. It is recommended that women do not breastfeed during treatment and for 4 months after treatment.   Fertility warning: Human fertility studies have not been done with this drug. Talk with your doctor or nurse if you plan to have children.  About This Drug   Enfortumab vedotin-ejfv is used to treat cancer. It is given in the vein (IV).   Possible Side Effects   Bone marrow suppression. This is a decrease in the number of white blood cells, red blood cells, and platelets. This may raise your risk of infection, make you tired and weak, and raise your risk of bleeding.    Tiredness    Nausea    Diarrhea (loose bowel movements)    Changes in the way food and drinks taste    Decreased appetite (decreased hunger)    Weight loss    Changes in your liver and kidney function    Changes in your electrolytes    Changes in your pancreas function    Decrease in a blood protein called albumin    An increased in uric acid in the blood    Blood sugar level may change    Effects on the nerves called peripheral neuropathy. You may feel numbness, tingling, or pain in your hands and feet. It may be hard for you to button your clothes, open jars, or walk as usual. The effect on the nerves may get worse with more doses of the drug. These effects get better in some people after the drug is stopped but it does not get better in all people.    Hair loss. Hair loss is often temporary, although with certain medicine, hair loss can sometimes be permanent. Hair loss may happen suddenly or gradually. If you lose hair, you may lose it from your head, face, armpits, pubic area, chest, and/or legs. You may also notice your hair getting thin.    Dry skin    Itching    Rash   Note: Each of  the side effects above was reported in 20% or greater of patients treated with enfortumab vedotin-ejfv. All possible side effects are not included. Your side effects may be different depending on Enfortumab vedotin-ejfv (Padcev) your cancer diagnosis, condition, or if you are receiving other drugs in combination. Please discuss any  concerns or questions with your medical team.   Warnings and Precautions    Severe changes in your blood sugar level which may be life-threatening    Severe effects on the nerves called peripheral neuropathy    Changes in eyesight such as dry eyes and blurred vision    Severe allergic skin reactions which can be life-threatening. You may develop blisters on your skin that are filled with fluid or a severe red rash all over your body that may be painful.    Hand-foot syndrome. The palms of your hands or soles of your feet may tingle, become numb, painful, swollen, or red.    Skin and tissue irritation including redness, pain, warmth, or swelling at the IV site if the drug leaks out of the vein and into nearby tissue.    Inflammation (swelling) and/or scarring of the lungs, which can be life-threatening. You may have a dry cough or trouble breathing. Note: Some of the side effects above are very rare. If you have concerns and/or questions, please discuss them with your medical team. Important Information    This drug may be present in the saliva, tears, sweat, urine, stool, vomit, semen, and vaginal secretions. Talk to your doctor and/or your nurse about the necessary precautions to take during this time.   Treating Side Effects    Manage tiredness by pacing your activities for the day.    Be sure to include periods of rest between energy-draining activities.    To decrease the risk of infection, wash your hands regularly.    Avoid close contact with people who have a cold, the flu, or other infections.    Take your temperature as your doctor or nurse tells  you, and whenever you feel like you may have a fever.  To help decrease the risk of bleeding, use a soft toothbrush. Check with your nurse before using dental floss.    Be very careful when using knives or tools.    Use an electric shaver instead of a razor.    To help with nausea and vomiting, eat small, frequent meals instead of three large meals a day. Choose foods and drinks that are at room temperature. Ask your nurse or doctor about other helpful tips and medicine that is available to help stop or lessen these symptoms.    Drink plenty of fluids (a minimum of eight glasses per day is recommended).    If you throw up or have diarrhea, you should drink more fluids so that you do not become dehydrated (lack of water in the body from losing too much fluid).    If you have diarrhea, eat low-fiber foods that are high in protein and calories and avoid foods that can irritate your digestive tracts or lead to cramping.    Ask your nurse or doctor about medicine that can lessen or stop your diarrhea.    To help with decreased appetite, eat small, frequent meals. Eat foods high in calories and protein, such as meat, poultry, fish, dry beans, tofu, eggs, nuts, milk, yogurt, cheese, ice cream, pudding, and nutritional supplements.    Consider using sauces and spices to increase taste. Daily exercise, with your doctor's approval, may increase your appetite.    Taking good care of your mouth may help food taste better and improve your appetite.    To help with weight loss, drink fluids that contribute calories (whole milk, juice, soft drinks, sweetened beverages, milkshakes, and nutritional supplements) instead of water.  If you have diabetes, keep good control of your blood sugar level. Tell your nurse or your doctor if your glucose levels are higher or lower than normal.    If you have numbness and tingling in your hands and feet, be careful when cooking, walking, and handling sharp objects and  hot liquids.    If you get a rash do not put anything on it unless your doctor or nurse says you may. Keep the area around the rash clean and dry. Ask your doctor for medicine if your rash bothers you.    To help with hair loss, wash with a mild shampoo and avoid washing your hair every day. Avoid coloring your hair.    Avoid rubbing your scalp, pat your hair or scalp dry.    Limit your use of hair spray, electric curlers, blow dryers, and curling irons.    If you are interested in getting a wig, talk to your nurse and they can help you get in touch with programs in your local area.    Moisturize your skin several times a day.    Avoid sun exposure and apply sunscreen routinely when outdoors.    If you have dry eyes, you should use artificial tears, hydrating or lubricating eye gels to help prevent or treat dry eyes. Ask your nurse or doctor for more information about which product they recommend for you.   Food and Drug Interactions    This drug may interact with grapefruit and grapefruit juice. Talk to your doctor as this could make side effects worse.    Check with your doctor or pharmacist about all other prescription medicines and over-the-counter medicines and dietary supplements (vitamins, minerals, herbs, and others) you are taking before starting this medicine as there are known drug interactions with enfortumab vedotin-ejfv. Also, check with your doctor or pharmacist before starting any new prescription or over-the-counter medicines, or dietary supplements to make sure that there are no interactions.   When to Call the Doctor .  Call your doctor or nurse if you have any of these symptoms and/or any new or unusual symptoms:    Fever of 100.4 F (38 C) or higher    Chills    Tiredness and/or weakness that interferes with your daily activities    Easy bleeding or bruising    Feeling dizzy or lightheaded    Dry eye    Blurred vision or other changes in eyesight    Pain  in your chest    Dry cough    Wheezing and/or trouble breathing    Nausea that stops you from eating or drinking and/or is not relieved by prescribed medicines    Throwing up more than 3 times a day    Diarrhea, 4 times in one day or diarrhea with lack of strength or a feeling of being dizzy    Lasting loss of appetite or rapid weight loss of five pounds in a week    New rash and/or itching that is bothersome    Dry skin that is bothersome    Rash that is not relieved by prescribed medicines    Numbness, tingling, or pain in your hands and/or feet    Painful, red, or swollen areas on your hands or feet    Swelling of the hands, feet, or any other part of the body    Flu-like symptoms: fever, headache, muscle and joint aches, and fatigue (low energy, feeling weak)    While you are getting this  drug, please tell your nurse right away if you have any pain, redness, or swelling at the site of the IV infusion.    Abnormal blood sugar    Unusual thirst, passing urine often, headache, sweating, shakiness, irritability    Signs of possible liver problems: dark urine, pale bowel movements, pain in your abdomen, feeling very tired and weak, unusual itching, or yellowing of the eyes or skin    Decreased or very dark urine  If you think you may be pregnant or may have impregnated your partner    Reproduction Warnings    Pregnancy warning: This drug can have harmful effects on the unborn baby. Women of childbearing potential should use effective methods of birth control during your cancer treatment and for at least 2 months after stopping treatment. Men with male partners of childbearing potential should use effective methods of birth control during your cancer treatment and for at least 4 months after stopping treatment. Let your doctor know right away if you think you may be pregnant or may have impregnated your partner.    Breastfeeding warning: Women should not breastfeed during  treatment and for at least 3 weeks after stopping treatment because this drug could enter the breast milk and cause harm to a breastfeeding baby.    Fertility warning: In men and women both, this drug may affect your ability to have children in the future. Talk with your doctor or nurse if you plan to have children. Ask for information on sperm or egg banking.   SELF CARE ACTIVITIES WHILE ON CHEMOTHERAPY/IMMUNOTHERAPY:  Hydration Increase your fluid intake 48 hours prior to treatment and drink at least 8 to 12 cups (64 ounces) of water/decaffeinated beverages per day after treatment. You can still have your cup of coffee or soda but these beverages do not count as part of your 8 to 12 cups that you need to drink daily. No alcohol intake.  Medications Continue taking your normal prescription medication as prescribed.  If you start any new herbal or new supplements please let us know first to make sure it is safe.  Mouth Care Have teeth cleaned professionally before starting treatment. Keep dentures and partial plates clean. Use soft toothbrush and do not use mouthwashes that contain alcohol. Biotene is a good mouthwash that is available at most pharmacies or may be ordered by calling [phone]. Use warm salt water gargles (1 teaspoon salt per 1 quart warm water) before and after meals and at bedtime. Or you may rinse with 2 tablespoons of three-percent hydrogen peroxide mixed in eight ounces of water. If you are still having problems with your mouth or sores in your mouth please call the clinic. If you need dental work, please let the doctor know before you go for your appointment so that we can coordinate the best possible time for you in regards to your chemo regimen. You need to also let your dentist know that you are actively taking chemo. We may need to do labs prior to your dental appointment.  Skin Care Always use sunscreen that has not expired and with SPF (Sun Protection Factor) of 50  or higher. Wear hats to protect your head from the sun. Remember to use sunscreen on your hands, ears, face, & feet.  Use good moisturizing lotions such as udder cream, eucerin, or even Vaseline. Some chemotherapies can cause dry skin, color changes in your skin and nails.    Avoid long, hot showers or baths. Use gentle, fragrance-free soaps  and laundry detergent. Use moisturizers, preferably creams or ointments rather than lotions because the thicker consistency is better at preventing skin dehydration. Apply the cream or ointment within 15 minutes of showering. Reapply moisturizer at night, and moisturize your hands every time after you wash them.   Infection Prevention Please wash your hands for at least 30 seconds using warm soapy water. Handwashing is the #1 way to prevent the spread of germs. Stay away from sick people or people who are getting over a cold. If you develop respiratory systems such as green/yellow mucus production or productive cough or persistent cough let us know and we will see if you need an antibiotic. It is a good idea to keep a pair of gloves on when going into grocery stores/[employer] to decrease your risk of coming into contact with germs on the carts, etc. Carry alcohol hand gel with you at all times and use it frequently if out in public. If your temperature reaches 100.5 or higher please call the clinic and let us know.  If it is after hours or on the weekend please go to the ER if your temperature is over 100.4.  Please have your own personal thermometer at home to use.    Sex and bodily fluids If you are going to have sex, a condom must be used to protect the person that isn't taking immunotherapy. For a few days after treatment, immunotherapy can be excreted through your bodily fluids.  When using the toilet please close the lid and flush the toilet twice.  Do this for a few day after you have had immunotherapy.   Contraception It is not known for sure whether or not  immunotherapy drugs can be passed on through semen or secretions from the vagina. Because of this some doctors advise people to use a barrier method if you have sex during treatment. This applies to vaginal, anal or oral sex.  Generally, doctors advise a barrier method only for the time you are actually having the treatment and for about a week after your treatment.  Advice like this can be worrying, but this does not mean that you have to avoid being intimate with your partner. You can still have close contact with your partner and continue to enjoy sex.  Animals If you have cats or birds we just ask that you not change the litter or change the cage.  Please have someone else do this for you while you are on immunotherapy.   Food Safety During and After Cancer Treatment Food safety is important for people both during and after cancer treatment. Cancer and cancer treatments, such as chemotherapy, radiation therapy, and stem cell/bone marrow transplantation, often weaken the immune system. This makes it harder for your body to protect itself from foodborne illness, also called food poisoning. Foodborne illness is caused by eating food that contains harmful bacteria, parasites, or viruses.  Foods to avoid Some foods have a higher risk of becoming tainted with bacteria. These include: Unwashed fresh fruit and vegetables, especially leafy vegetables that can hide dirt and other contaminants Raw sprouts, such as alfalfa sprouts Raw or undercooked beef, especially ground beef, or other raw or undercooked meat and poultry Fatty, fried, or spicy foods immediately before or after treatment.  These can sit heavy on your stomach and make you feel nauseous. Raw or undercooked shellfish, such as oysters. Sushi and sashimi, which often contain raw fish.  Unpasteurized beverages, such as unpasteurized fruit juices, raw milk, raw yogurt, or  cider Undercooked eggs, such as soft boiled, over easy, and poached;  raw, unpasteurized eggs; or foods made with raw egg, such as homemade raw cookie dough and homemade mayonnaise  Simple steps for food safety  Shop smart. Do not buy food stored or displayed in an unclean area. Do not buy bruised or damaged fruits or vegetables. Do not buy cans that have cracks, dents, or bulges. Pick up foods that can spoil at the end of your shopping trip and store them in a cooler on the way home.  Prepare and clean up foods carefully. Rinse all fresh fruits and vegetables under running water, and dry them with a clean towel or paper towel. Clean the top of cans before opening them. After preparing food, wash your hands for 20 seconds with hot water and soap. Pay special attention to areas between fingers and under nails. Clean your utensils and dishes with hot water and soap. Disinfect your kitchen and cutting boards using 1 teaspoon of liquid, unscented bleach mixed into 1 quart of water.    Dispose of old food. Eat canned and packaged food before its expiration date (the "use by" or "best before" date). Consume refrigerated leftovers within 3 to 4 days. After that time, throw out the food. Even if the food does not smell or look spoiled, it still may be unsafe. Some bacteria, such as Listeria, can grow even on foods stored in the refrigerator if they are kept for too long.  Take precautions when eating out. At restaurants, avoid buffets and salad bars where food sits out for a long time and comes in contact with many people. Food can become contaminated when someone with a virus, often a norovirus, or another "bug" handles it. Put any leftover food in a "to-go" container yourself, rather than having the server do it. And, refrigerate leftovers as soon as you get home. Choose restaurants that are clean and that are willing to prepare your food as you order it cooked.    SYMPTOMS TO REPORT AS SOON AS POSSIBLE AFTER TREATMENT:  FEVER GREATER THAN 100.4 F  CHILLS  WITH OR WITHOUT FEVER  NAUSEA AND VOMITING THAT IS NOT CONTROLLED WITH YOUR NAUSEA MEDICATION  UNUSUAL SHORTNESS OF BREATH  UNUSUAL BRUISING OR BLEEDING  TENDERNESS IN MOUTH AND THROAT WITH OR WITHOUT PRESENCE OF ULCERS  URINARY PROBLEMS  BOWEL PROBLEMS  UNUSUAL RASH     Wear comfortable clothing and clothing appropriate for easy access to any Portacath or PICC line. Let us know if there is anything that we can do to make your therapy better!   What to do if you need assistance after hours or on the weekends: CALL [phone].  HOLD on the line, do not hang up.  You will hear multiple messages but at the end you will be connected with a nurse triage line.  They will contact the doctor if necessary.  Most of the time they will be able to assist you.  Do not call the hospital operator.    I have been informed and understand all of the instructions given to me and have received a copy. I have been instructed to call the clinic [phone] or my family physician as soon as possible for continued medical care, if indicated. I do not have any more questions at this time but understand that I may call the [facility] or the Patient Navigator at [phone] during office hours should I have questions or need assistance in obtaining follow-up  care.

## 2022-08-16 ENCOUNTER — Encounter (HOSPITAL_COMMUNITY): Payer: Self-pay

## 2022-08-16 ENCOUNTER — Other Ambulatory Visit: Payer: Self-pay | Admitting: Hematology

## 2022-08-16 ENCOUNTER — Other Ambulatory Visit: Payer: Self-pay

## 2022-08-16 ENCOUNTER — Ambulatory Visit (HOSPITAL_COMMUNITY)
Admission: RE | Admit: 2022-08-16 | Discharge: 2022-08-16 | Disposition: A | Discharge status: Home / Self Care | Payer: MEDICARE | Source: Ambulatory Visit | Attending: Hematology | Admitting: Hematology

## 2022-08-16 DIAGNOSIS — C652 Malignant neoplasm of left renal pelvis: Secondary | ICD-10-CM | POA: Insufficient documentation

## 2022-08-16 DIAGNOSIS — Z87891 Personal history of nicotine dependence: Secondary | ICD-10-CM | POA: Insufficient documentation

## 2022-08-16 DIAGNOSIS — C787 Secondary malignant neoplasm of liver and intrahepatic bile duct: Secondary | ICD-10-CM | POA: Diagnosis not present

## 2022-08-16 DIAGNOSIS — Z905 Acquired absence of kidney: Secondary | ICD-10-CM | POA: Insufficient documentation

## 2022-08-16 DIAGNOSIS — C679 Malignant neoplasm of bladder, unspecified: Secondary | ICD-10-CM | POA: Insufficient documentation

## 2022-08-16 HISTORY — PX: IR IMAGING GUIDED PORT INSERTION: IMG5740

## 2022-08-16 MED ORDER — SODIUM CHLORIDE 0.9 % IV SOLN: INTRAVENOUS | Status: DC

## 2022-08-16 MED ORDER — FENTANYL CITRATE (PF) 100 MCG/2ML IJ SOLN
INTRAMUSCULAR | Status: AC | PRN
  Administered 2022-08-16 (×2): 50 ug via INTRAVENOUS

## 2022-08-16 MED ORDER — HEPARIN SOD (PORK) LOCK FLUSH 100 UNIT/ML IV SOLN
INTRAVENOUS | Status: AC
  Administered 2022-08-16: 500 [IU]
  Filled 2022-08-16: qty 5

## 2022-08-16 MED ORDER — FENTANYL CITRATE (PF) 100 MCG/2ML IJ SOLN
INTRAMUSCULAR | Status: AC
  Filled 2022-08-16: qty 2

## 2022-08-16 MED ORDER — MIDAZOLAM HCL 2 MG/2ML IJ SOLN
INTRAMUSCULAR | Status: AC
  Filled 2022-08-16: qty 2

## 2022-08-16 MED ORDER — LIDOCAINE-EPINEPHRINE 1 %-1:100000 IJ SOLN
INTRAMUSCULAR | Status: AC
  Administered 2022-08-16: 20 mL
  Filled 2022-08-16: qty 1

## 2022-08-16 MED ORDER — MIDAZOLAM HCL 2 MG/2ML IJ SOLN
INTRAMUSCULAR | Status: AC | PRN
  Administered 2022-08-16 (×2): 1 mg via INTRAVENOUS

## 2022-08-16 NOTE — Progress Notes (Signed)
Pt and wife received d/c instructions written and verbal. All questions and concerns addressed. PIV removed and intact, dressing site to R upper chest intact and clean. Denies any pain or discomfort. Pt left in stable condition.

## 2022-08-16 NOTE — H&P (Signed)
Chief Complaint: Patient was seen in consultation today for [facility] a cath placement at the request of Katragadda,Sreedhar  Referring Physician(s): Firefighter  Supervising Physician: Arne Cleveland  Patient Status: [facility] - Out-pt  History of Present Illness: Joshua Osborne is a 81 y.o. 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, metastatic to lymph nodes in the chest, lung nodules, para-aortic lymph node:   Scheduled today for Port a cath placement To start Immunotherapy tomorrow   Past Medical History:  Diagnosis Date   Anemia    Anxiety    Arthritis    Bladder tumor    Cancer ([place])    hx of skin cancer, renal cancer   Diabetes mellitus without complication ([place])    GERD (gastroesophageal reflux disease)    Heart murmur    as a child   History of kidney stones    Sleep apnea    cpap    Past Surgical History:  Procedure Laterality Date   bilateral dupreyen contracture surgery      CHOLECYSTECTOMY     CYSTOSCOPY N/A 05/30/2022   Procedure: CYSTOSCOPY WITH TRANSURETHRAL RESECTION OF THE LEFT URETERAL ORIFICE,  TRANSURETHRAL RESECTION OF BLADDER TUMOR;  Surgeon: Ceasar Mons, MD;  Location: [facility];  Service: Urology;  Laterality: N/A;   CYSTOSCOPY WITH RETROGRADE PYELOGRAM, URETEROSCOPY AND STENT PLACEMENT Left 04/26/2022   Procedure: CYSTOSCOPY WITH RETROGRADE PYELOGRAM, URETEROSCOPY AND STENT PLACEMENT;  Surgeon: Cleon Gustin, MD;  Location: [facility];  Service: Urology;  Laterality: Left;   LIVER BIOPSY     ROBOT ASSITED LAPAROSCOPIC NEPHROURETERECTOMY Left 05/30/2022   Procedure: XI ROBOT ASSITED LAPAROSCOPIC NEPHROURETERECTOMY;  Surgeon: Ceasar Mons, MD;  Location: [facility];  Service: Urology;  Laterality: Left;   TRANSURETHRAL RESECTION OF BLADDER TUMOR  04/26/2022   Procedure: TRANSURETHRAL  RESECTION OF BLADDER TUMOR (TURBT);  Surgeon: Cleon Gustin, MD;  Location: [facility];  Service: Urology;;    Allergies: Patient has no known allergies.  Medications: Prior to Admission medications   Medication Sig Start Date End Date Taking? Authorizing Provider  acetaminophen (TYLENOL) 500 MG tablet Take 1,000 mg by mouth every 6 (six) hours as needed for moderate pain.   Yes [provider]  amoxicillin (AMOXIL) 500 MG capsule Take 1 capsule (500 mg total) by mouth 3 (three) times daily for 7 days. 08/15/22 08/22/22 Yes Derek Jack, MD  citalopram (CELEXA) 20 MG tablet Take 20 mg by mouth daily.   Yes [provider]  diclofenac (VOLTAREN) 75 MG EC tablet Take 75 mg by mouth 2 (two) times daily.   Yes [provider]  docusate sodium (COLACE) 250 MG capsule Take 250 mg by mouth 2 (two) times daily.   Yes [provider]  Enfortumab Vedotin-ejfv (PADCEV IV) Inject into the vein once a week. Days 1 and 8 every 21 days 08/17/22  Yes [provider]  fluorouracil (EFUDEX) 5 % cream Apply 1 Application topically 2 (two) times daily as needed (cancer spots).   Yes [provider]  HYDROcodone-acetaminophen (NORCO/VICODIN) 5-325 MG tablet Take 1 tablet by mouth 2 (two) times daily as needed for moderate pain. 07/14/22  Yes [provider]  latanoprost (XALATAN) 0.005 % ophthalmic solution Place 1 drop into both eyes at bedtime. 12/19/21  Yes [provider]  metFORMIN (GLUCOPHAGE) 500 MG tablet Take 500 mg by mouth 2 (two) times daily  with a meal.   Yes [provider]  nitrofurantoin, macrocrystal-monohydrate, (MACROBID) 100 MG capsule Take 1 capsule (100 mg total) by mouth 2 (two) times daily. 08/09/22  Yes Derek Jack, MD  omeprazole (PRILOSEC) 20 MG capsule Take 20 mg by mouth 2 (two) times daily.   Yes [provider]  PEMBROLIZUMAB IV Inject into the vein every 21 ( twenty-one) days.  08/17/22  Yes [provider]  promethazine (PHENERGAN) 12.5 MG tablet Take 1 tablet (12.5 mg total) by mouth at bedtime as needed. 07/24/22  Yes Derek Jack, MD  simvastatin (ZOCOR) 40 MG tablet Take 40 mg by mouth daily.   Yes [provider]  tamsulosin (FLOMAX) 0.4 MG CAPS capsule Take 0.4 mg by mouth at bedtime.   Yes [provider]  timolol (BETIMOL) 0.5 % ophthalmic solution Place 1 drop into both eyes daily.   Yes [provider]  tizanidine (ZANAFLEX) 2 MG capsule Take 2 mg by mouth at bedtime.   Yes [provider]  docusate sodium (COLACE) 100 MG capsule Take 1 capsule (100 mg total) by mouth 2 (two) times daily as needed for mild constipation. Patient not taking: Reported on 08/14/2022 06/02/22 06/02/23  Ceasar Mons, MD  oxyCODONE-acetaminophen (PERCOCET) 7.5-325 MG tablet Take 1 tablet by mouth every 4 (four) hours as needed for moderate pain. Patient not taking: Reported on 08/14/2022 06/02/22   Ceasar Mons, MD     History reviewed. No pertinent family history.  Social History   Socioeconomic History   Marital status: Married    Spouse name: Not on file   Number of children: Not on file   Years of education: Not on file   Highest education level: Not on file  Occupational History   Not on file  Tobacco Use   Smoking status: Former    Types: Cigarettes   Smokeless tobacco: Never  Vaping Use   Vaping Use: Never used  Substance and Sexual Activity   Alcohol use: Never   Drug use: Never   Sexual activity: Not Currently  Other Topics Concern   Not on file  Social History Narrative   Not on file   Social Determinants of Health   Financial Resource Strain: Not on file  Food Insecurity: Not on file  Transportation Needs: Not on file  Physical Activity: Not on file  Stress: Not on file  Social Connections: Not on file    Review of Systems: A 12 point ROS discussed and pertinent positives  are indicated in the HPI above.  All other systems are negative.  Review of Systems  Constitutional:  Negative for activity change, fatigue and fever.  Respiratory:  Negative for cough and shortness of breath.   Cardiovascular:  Negative for chest pain.  Gastrointestinal:  Negative for diarrhea and nausea.  Neurological:  Negative for weakness.  Psychiatric/Behavioral:  Negative for behavioral problems and confusion.     Vital Signs: BP (!) 142/72   Pulse 99   Temp 97.7 F (36.5 C) (Temporal)   Resp 17   Ht 6' (1.829 m)   Wt 165 lb (74.8 kg)   SpO2 99%   BMI 22.38 kg/m    Physical Exam Vitals reviewed.  HENT:     Mouth/Throat:     Mouth: Mucous membranes are moist.  Cardiovascular:     Rate and Rhythm: Normal rate and regular rhythm.     Heart sounds: Normal heart sounds.  Pulmonary:     Effort: Pulmonary effort  is normal.     Breath sounds: Normal breath sounds.  Abdominal:     Palpations: Abdomen is soft.  Musculoskeletal:        General: Normal range of motion.  Skin:    General: Skin is warm.  Neurological:     Mental Status: He is alert and oriented to person, place, and time.  Psychiatric:        Behavior: Behavior normal.     Imaging: NM PET Image Restag (PS) Skull Base To Thigh  Result Date: 08/07/2022 CLINICAL DATA:  Subsequent treatment strategy for invasive bladder cancer. Renal cell carcinoma. LEFT nephrectomy. EXAM: NUCLEAR MEDICINE PET SKULL BASE TO THIGH TECHNIQUE: 8.48 mCi F-18 FDG was injected intravenously. Full-ring PET imaging was performed from the skull base to thigh after the radiotracer. CT data was obtained and used for attenuation correction and anatomic localization. Fasting blood glucose: 116 COMPARISON:  PET-CT 03/16/2022 FINDINGS: Mediastinal blood pool activity: SUV max 2.3 Liver activity: SUV max NA NECK: New hypermetabolic LEFT supraclavicular node measures 13 mm (image 71/3) with SUV max equal 3.8. Incidental CT findings: None.  CHEST: New hypermetabolic mediastinal lymph nodes. For example 13 mm prevascular node with SUV max equal 5.5 (image 93). High RIGHT paratracheal node measuring 12 mm (image 87) with SUV max equal 5.0. High LEFT paratracheal lymph node measuring 10 mm SUV max equal 4.1 on image 80). There several new pulmonary nodules. Example RIGHT lobe nodule measuring 8 mm image 120/3. RIGHT middle lobe nodule along the pleural surface measuring 12 mm image 120/3. RIGHT lower lobe nodule measuring 7 mm on image 128/3. Similar small nodule at the LEFT lung base on image 128. The largest of these nodules in the RIGHT middle lobe has metabolic activity SUV max equal 2.9 on image 119. The other smaller nodules do not have metabolic activity. Incidental CT findings: None. ABDOMEN/PELVIS: Post LEFT nephrectomy. New hypermetabolic nodal tissue LEFT aorta SUV max equal 5.1. No abnormal activity associated with the RIGHT kidney. No hypermetabolic tissue in the liver. Incidental CT findings: Prostate enlarged SKELETON: No focal hypermetabolic activity to suggest skeletal metastasis. Incidental CT findings: None. IMPRESSION: 1. Unfortunately evidence of new hypermetabolic mediastinal nodal metastasis and LEFT supraclavicular nodal metastasis. 2. Bilateral new small round pulmonary nodules. Largest nodule has metabolic activity. Findings consistent with new pulmonary metastasis. 3. Post LEFT nephrectomy. Hypermetabolic nodal tissue LEFT of aorta new from prior. 4. No hypermetabolic liver metastasis. Electronically Signed   By: Suzy Bouchard M.D.   On: 08/07/2022 09:47    Labs:  CBC: Recent Labs    06/02/22 0724 06/03/22 0504 06/30/22 1101 07/24/22 1425  WBC 20.5* 16.6* 13.6* 9.3  HGB 8.1* 7.6* 8.8* 9.4*  HCT 25.3* 24.0* 27.6* 30.4*  PLT 264 315 363 340    COAGS: Recent Labs    04/21/22 1114  INR 1.2    BMP: Recent Labs    06/02/22 0724 06/03/22 0504 06/30/22 1101 07/24/22 1425  NA 134* 137 137 139  K 4.5 3.9  5.3* 4.8  CL 101 101 104 107  CO2 '26 27 28 25  '$ GLUCOSE 150* 132* 156* 122*  BUN 20 18 32* 41*  CALCIUM 8.7* 8.6* 9.4 8.8*  CREATININE 1.39* 1.34* 1.30* 1.84*  GFRNONAA 51* 54* 56* 37*    LIVER FUNCTION TESTS: Recent Labs    04/25/22 1415 04/27/22 0517 06/30/22 1101 07/24/22 1425  BILITOT 1.3* 0.6 0.4 0.3  AST 57* 24 10* 15  ALT 80* 52* 8 16  ALKPHOS 246*  192* 70 66  PROT 7.0 6.4* 7.1 7.2  ALBUMIN 2.9* 2.7* 4.1 3.8    TUMOR MARKERS: No results for input(s): "AFPTM", "CEA", "CA199", "CHROMGRNA" in the last 8760 hours.  Assessment and Plan:  Urothelial cancer with mets to liver To start therapy tomorrow with Dr Delton Coombes Scheduled for [facility] a cath today Risks and benefits of image guided port-a-catheter placement was discussed with the patient including, but not limited to bleeding, infection, pneumothorax, or fibrin sheath development and need for additional procedures.  All of the patient's questions were answered, patient is agreeable to proceed. Consent signed and in chart.  Thank you for this interesting consult.  I greatly enjoyed meeting Aeon Kessner and look forward to participating in their care.  A copy of this report was sent to the requesting provider on this date.  Electronically Signed: Lavonia Drafts, PA-C 08/16/2022, 9:44 AM   I spent a total of  30 Minutes   in face to face in clinical consultation, greater than 50% of which was counseling/coordinating care for [facility] placement

## 2022-08-16 NOTE — Procedures (Signed)
  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  Postprocedure diagnosis: same EBL:    minimal Complications:   none immediate  See full dictation in [facility].  Dillard Cannon MD Main # [phone] Pager  [phone] Mobile [phone]

## 2022-08-17 ENCOUNTER — Other Ambulatory Visit: Payer: Self-pay | Admitting: *Deleted

## 2022-08-17 ENCOUNTER — Inpatient Hospital Stay: Payer: MEDICARE | Admitting: Licensed Clinical Social Worker

## 2022-08-17 ENCOUNTER — Encounter: Payer: Self-pay | Admitting: Hematology

## 2022-08-17 ENCOUNTER — Inpatient Hospital Stay: Payer: MEDICARE

## 2022-08-17 VITALS — BP 148/71 | HR 80 | Temp 98.1°F | Resp 18

## 2022-08-17 DIAGNOSIS — Z95828 Presence of other vascular implants and grafts: Secondary | ICD-10-CM | POA: Insufficient documentation

## 2022-08-17 DIAGNOSIS — C652 Malignant neoplasm of left renal pelvis: Secondary | ICD-10-CM

## 2022-08-17 DIAGNOSIS — D509 Iron deficiency anemia, unspecified: Secondary | ICD-10-CM

## 2022-08-17 HISTORY — DX: Presence of other vascular implants and grafts: Z95.828

## 2022-08-17 LAB — CBC WITH DIFFERENTIAL/PLATELET
Abs Immature Granulocytes: 0.09 10*3/uL — ABNORMAL HIGH (ref 0.00–0.07)
Basophils Absolute: 0.1 10*3/uL (ref 0.0–0.1)
Basophils Relative: 1 %
Eosinophils Absolute: 0.3 10*3/uL (ref 0.0–0.5)
Eosinophils Relative: 3 %
HCT: 28.1 % — ABNORMAL LOW (ref 39.0–52.0)
Hemoglobin: 8.7 g/dL — ABNORMAL LOW (ref 13.0–17.0)
Immature Granulocytes: 1 %
Lymphocytes Relative: 11 %
Lymphs Abs: 1.2 10*3/uL (ref 0.7–4.0)
MCH: 20.5 pg — ABNORMAL LOW (ref 26.0–34.0)
MCHC: 31 g/dL (ref 30.0–36.0)
MCV: 66.3 fL — ABNORMAL LOW (ref 80.0–100.0)
Monocytes Absolute: 0.9 10*3/uL (ref 0.1–1.0)
Monocytes Relative: 8 %
Neutro Abs: 8.1 10*3/uL — ABNORMAL HIGH (ref 1.7–7.7)
Neutrophils Relative %: 76 %
Platelets: 344 10*3/uL (ref 150–400)
RBC: 4.24 MIL/uL (ref 4.22–5.81)
RDW: 18.9 % — ABNORMAL HIGH (ref 11.5–15.5)
WBC: 10.6 10*3/uL — ABNORMAL HIGH (ref 4.0–10.5)
nRBC: 0 % (ref 0.0–0.2)

## 2022-08-17 LAB — IRON AND TIBC
Iron: 48 ug/dL (ref 45–182)
Saturation Ratios: 21 % (ref 17.9–39.5)
TIBC: 228 ug/dL — ABNORMAL LOW (ref 250–450)
UIBC: 180 ug/dL

## 2022-08-17 LAB — COMPREHENSIVE METABOLIC PANEL
ALT: 20 U/L (ref 0–44)
AST: 16 U/L (ref 15–41)
Albumin: 3.7 g/dL (ref 3.5–5.0)
Alkaline Phosphatase: 72 U/L (ref 38–126)
Anion gap: 7 (ref 5–15)
BUN: 33 mg/dL — ABNORMAL HIGH (ref 8–23)
CO2: 24 mmol/L (ref 22–32)
Calcium: 8.8 mg/dL — ABNORMAL LOW (ref 8.9–10.3)
Chloride: 107 mmol/L (ref 98–111)
Creatinine, Ser: 1.6 mg/dL — ABNORMAL HIGH (ref 0.61–1.24)
GFR, Estimated: 43 mL/min — ABNORMAL LOW (ref >60)
Glucose, Bld: 136 mg/dL — ABNORMAL HIGH (ref 70–99)
Potassium: 4.6 mmol/L (ref 3.5–5.1)
Sodium: 138 mmol/L (ref 135–145)
Total Bilirubin: 0.5 mg/dL (ref 0.3–1.2)
Total Protein: 6.7 g/dL (ref 6.5–8.1)

## 2022-08-17 LAB — MAGNESIUM: Magnesium: 1.8 mg/dL (ref 1.7–2.4)

## 2022-08-17 LAB — FERRITIN: Ferritin: 148 ng/mL (ref 24–336)

## 2022-08-17 MED ORDER — SODIUM CHLORIDE 0.9 % IV SOLN: Freq: Once | INTRAVENOUS | Status: AC

## 2022-08-17 MED ORDER — LIDOCAINE-PRILOCAINE 2.5-2.5 % EX CREA: TOPICAL_CREAM | CUTANEOUS | 3 refills | Status: DC

## 2022-08-17 MED ORDER — SODIUM CHLORIDE 0.9 % IV SOLN
1.2700 mg/kg | Freq: Once | INTRAVENOUS | Status: AC
  Administered 2022-08-17: 100 mg via INTRAVENOUS
  Filled 2022-08-17: qty 10

## 2022-08-17 MED ORDER — SODIUM CHLORIDE 0.9% FLUSH
10.0000 mL | INTRAVENOUS | Status: DC | PRN
  Administered 2022-08-17: 10 mL via INTRAVENOUS

## 2022-08-17 MED ORDER — SODIUM CHLORIDE 0.9 % IV SOLN
200.0000 mg | Freq: Once | INTRAVENOUS | Status: AC
  Administered 2022-08-17: 200 mg via INTRAVENOUS
  Filled 2022-08-17: qty 8

## 2022-08-17 MED ORDER — PROCHLORPERAZINE MALEATE 10 MG PO TABS
10.0000 mg | ORAL_TABLET | Freq: Once | ORAL | Status: AC
  Administered 2022-08-17: 10 mg via ORAL
  Filled 2022-08-17: qty 1

## 2022-08-17 MED ORDER — SODIUM CHLORIDE 0.9% FLUSH
10.0000 mL | INTRAVENOUS | Status: DC | PRN
  Administered 2022-08-17: 10 mL

## 2022-08-17 MED ORDER — HEPARIN SOD (PORK) LOCK FLUSH 100 UNIT/ML IV SOLN
500.0000 [IU] | Freq: Once | INTRAVENOUS | Status: AC | PRN
  Administered 2022-08-17: 500 [IU]

## 2022-08-17 NOTE — Progress Notes (Signed)
Patients port flushed without difficulty.  Good blood return noted with no bruising or swelling noted at site.  Patient remains accessed for chemotherapy treatment.  

## 2022-08-17 NOTE — Progress Notes (Signed)
Patient presents today for Keytruda and Padcav, patient's Ser. Creatinine 1.6, patient okay for treatment with additional ordered received for 570m of Normal Saline. Patient tolerated chemotherapy with no complaints voiced. Side effects with management reviewed understanding verbalized. Port site clean and dry with no bruising or swelling noted at site. Good blood return noted before and after administration of chemotherapy. Band aid applied. Patient left in satisfactory condition with VSS and no s/s of distress noted.

## 2022-08-17 NOTE — Patient Instructions (Signed)
[facility]  Discharge Instructions: Thank you for choosing [facility] to provide your oncology and hematology care.  If you have a lab appointment with the [facility], please come in thru the Main Entrance and check in at the main information desk.  Wear comfortable clothing and clothing appropriate for easy access to any Portacath or PICC line.   We strive to give you quality time with your provider. You may need to reschedule your appointment if you arrive late (15 or more minutes).  Arriving late affects you and other patients whose appointments are after yours.  Also, if you miss three or more appointments without notifying the office, you may be dismissed from the clinic at the provider's discretion.      For prescription refill requests, have your pharmacy contact our office and allow 72 hours for refills to be completed.    Today you received the following chemotherapy and/or immunotherapy agents Padcav and Keytruda, return as scheduled.   To help prevent nausea and vomiting after your treatment, we encourage you to take your nausea medication as directed.  BELOW ARE SYMPTOMS THAT SHOULD BE REPORTED IMMEDIATELY: *FEVER GREATER THAN 100.4 F (38 C) OR HIGHER *CHILLS OR SWEATING *NAUSEA AND VOMITING THAT IS NOT CONTROLLED WITH YOUR NAUSEA MEDICATION *UNUSUAL SHORTNESS OF BREATH *UNUSUAL BRUISING OR BLEEDING *URINARY PROBLEMS (pain or burning when urinating, or frequent urination) *BOWEL PROBLEMS (unusual diarrhea, constipation, pain near the anus) TENDERNESS IN MOUTH AND THROAT WITH OR WITHOUT PRESENCE OF ULCERS (sore throat, sores in mouth, or a toothache) UNUSUAL RASH, SWELLING OR PAIN  UNUSUAL VAGINAL DISCHARGE OR ITCHING   Items with * indicate a potential emergency and should be followed up as soon as possible or go to the Emergency Department if any problems should occur.  Please show the CHEMOTHERAPY ALERT CARD or IMMUNOTHERAPY ALERT  CARD at check-in to the Emergency Department and triage nurse.  Should you have questions after your visit or need to cancel or reschedule your appointment, please contact [facility] [phone]  and follow the prompts.  Office hours are 8:00 a.m. to 4:30 p.m. Monday - Friday. Please note that voicemails left after 4:00 p.m. may not be returned until the following business day.  We are closed weekends and major holidays. You have access to a nurse at all times for urgent questions. Please call the main number to the clinic [phone] and follow the prompts.  For any non-urgent questions, you may also contact your provider using MyChart. We now offer e-Visits for anyone 30 and older to request care online for non-urgent symptoms. For details visit [email or web address].   Also download the MyChart app! Go to the app store, search "MyChart", open the app, select [facility], and log in with your MyChart username and password.  Masks are optional in the cancer centers. If you would like for your care team to wear a mask while they are taking care of you, please let them know. You may have one support person who is at least 80 years old accompany you for your appointments.

## 2022-08-17 NOTE — Progress Notes (Signed)
Pharmacist Chemotherapy Monitoring - Initial Assessment    Anticipated start date: 08/17/22   The following has been reviewed per standard work regarding the patient's treatment regimen: The patient's diagnosis, treatment plan and drug doses, and organ/hematologic function Lab orders and baseline tests specific to treatment regimen  The treatment plan start date, drug sequencing, and pre-medications Prior authorization status  Patient's documented medication list, including drug-drug interaction screen and prescriptions for anti-emetics and supportive care specific to the treatment regimen The drug concentrations, fluid compatibility, administration routes, and timing of the medications to be used The patient's access for treatment and lifetime cumulative dose history, if applicable  The patient's medication allergies and previous infusion related reactions, if applicable   Changes made to treatment plan:  N/A  Follow up needed:  N/A   Wynona Neat, [facility], 08/17/2022  9:36 AM

## 2022-08-17 NOTE — Progress Notes (Signed)
[facility] Clinical Social Work  Initial Assessment   Joshua Osborne is a 81 y.o. year old male accompanied by patient and spouse. Clinical Social Work was referred by medical provider for assessment of psychosocial needs.   SDOH (Social Determinants of Health) assessments performed: Yes   SDOH Screenings   Tobacco Use: Medium Risk (08/17/2022)     Distress Screen completed: No     No data to display            Family/Social Information:  Housing Arrangement: patient lives with his spouse Rise Paganini). Family members/support persons in your life? The couple has adult children residing close by with whom they are in contact with daily, as well as other friends and family who are able to assist if needed. 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  Income source: Paediatric nurse concerns: No Type of concern: None Food access concerns: no Religious or spiritual practice: Hydrographic surveyor Currently in place:  none  Coping/ Adjustment to diagnosis: Patient understands treatment plan and what happens next? yes Concerns about diagnosis and/or treatment:  no concerns at this time Patient reported stressors:  no stressors reported at this time Hopes and/or priorities: pt's priority is to begin treatment w/ the hope of a positive outcome Patient enjoys time with family/ friends Current coping skills/ strengths: Ability for insight , Communication skills , Scientist, research (life sciences) , Motivation for treatment/growth , Physical Health , and Supportive family/friends     SUMMARY: Current SDOH Barriers:  No barriers identified at this time.  Clinical Social Work Clinical Goal(s):  No clinical social work goals at this time  Interventions: Discussed common feeling and emotions when being diagnosed with cancer, and the importance of support during treatment Informed patient of the support team roles and support services  at [facility] [place] contact information and encouraged patient to call with any questions or concerns Pt has completed the application and registered w/ the [facility] and will receive supportive services from them.   Follow Up Plan: Patient will contact CSW with any support or resource needs Patient verbalizes understanding of plan: Yes    Henriette Combs, LCSW

## 2022-08-18 NOTE — Progress Notes (Signed)
24 hour follow up, no issues at this time, only complaint was fatigue, eating and drinking ok today. Wife has no other questions at this time.

## 2022-08-22 ENCOUNTER — Other Ambulatory Visit: Payer: Self-pay

## 2022-08-23 ENCOUNTER — Ambulatory Visit (INDEPENDENT_AMBULATORY_CARE_PROVIDER_SITE_OTHER): Payer: MEDICARE | Admitting: Urology

## 2022-08-23 VITALS — BP 127/69 | HR 85

## 2022-08-23 DIAGNOSIS — N1330 Unspecified hydronephrosis: Secondary | ICD-10-CM

## 2022-08-23 DIAGNOSIS — Z8551 Personal history of malignant neoplasm of bladder: Secondary | ICD-10-CM | POA: Diagnosis not present

## 2022-08-23 MED ORDER — CIPROFLOXACIN HCL 500 MG PO TABS
500.0000 mg | ORAL_TABLET | Freq: Once | ORAL | Status: AC
  Administered 2022-08-23: 500 mg via ORAL

## 2022-08-23 NOTE — H&P (View-Only) (Signed)
   08/23/22  CC: bladder cancer   HPI: Joshua Osborne is a 81yo here for followup for bladder cancer Blood pressure 127/69, pulse 85. NED. A&Ox3.   No respiratory distress   Abd soft, NT, ND Normal phallus with bilateral descended testicles  Cystoscopy Procedure Note  Patient identification was confirmed, informed consent was obtained, and patient was prepped using Betadine solution.  Lidocaine jelly was administered per urethral meatus.     Pre-Procedure: - Inspection reveals a normal caliber ureteral meatus.  Procedure: The flexible cystoscope was introduced without difficulty - No urethral strictures/lesions are present. - Enlarged prostate  - Normal bladder neck - Bilateral ureteral orifices identified - Bladder mucosa  reveals mutliple 2-3cm papillary bladder tumors - No bladder stones - No trabeculation    Post-Procedure: - Patient tolerated the procedure well  Assessment/ Plan: We discussed the management of bladder tumors including transurethral resection. After discussing the procedure the patient wishes to pursue surgery. Risks/benefits/alternatives discussed.    No follow-ups on file.  [name], MD

## 2022-08-23 NOTE — Progress Notes (Signed)
   08/23/22  CC: bladder cancer   HPI: [name] is a 81yo here for followup for bladder cancer Blood pressure 127/69, pulse 85. NED. A&Ox3.   No respiratory distress   Abd soft, NT, ND Normal phallus with bilateral descended testicles  Cystoscopy Procedure Note  Patient identification was confirmed, informed consent was obtained, and patient was prepped using Betadine solution.  Lidocaine jelly was administered per urethral meatus.     Pre-Procedure: - Inspection reveals a normal caliber ureteral meatus.  Procedure: The flexible cystoscope was introduced without difficulty - No urethral strictures/lesions are present. - Enlarged prostate  - Normal bladder neck - Bilateral ureteral orifices identified - Bladder mucosa  reveals mutliple 2-3cm papillary bladder tumors - No bladder stones - No trabeculation    Post-Procedure: - Patient tolerated the procedure well  Assessment/ Plan: We discussed the management of bladder tumors including transurethral resection. After discussing the procedure the patient wishes to pursue surgery. Risks/benefits/alternatives discussed.    No follow-ups on file.  [name], MD

## 2022-08-24 ENCOUNTER — Inpatient Hospital Stay: Payer: MEDICARE

## 2022-08-24 ENCOUNTER — Encounter: Payer: Self-pay | Admitting: Hematology

## 2022-08-24 ENCOUNTER — Telehealth: Payer: Self-pay

## 2022-08-24 ENCOUNTER — Inpatient Hospital Stay (HOSPITAL_BASED_OUTPATIENT_CLINIC_OR_DEPARTMENT_OTHER): Payer: MEDICARE | Admitting: Hematology

## 2022-08-24 ENCOUNTER — Other Ambulatory Visit: Payer: Self-pay | Admitting: *Deleted

## 2022-08-24 DIAGNOSIS — C652 Malignant neoplasm of left renal pelvis: Secondary | ICD-10-CM | POA: Diagnosis not present

## 2022-08-24 DIAGNOSIS — E083512 Diabetes mellitus due to underlying condition with proliferative diabetic retinopathy with macular edema, left eye: Secondary | ICD-10-CM

## 2022-08-24 DIAGNOSIS — Z95828 Presence of other vascular implants and grafts: Secondary | ICD-10-CM

## 2022-08-24 DIAGNOSIS — D649 Anemia, unspecified: Secondary | ICD-10-CM

## 2022-08-24 LAB — CBC WITH DIFFERENTIAL/PLATELET
Abs Immature Granulocytes: 0.06 10*3/uL (ref 0.00–0.07)
Basophils Absolute: 0.1 10*3/uL (ref 0.0–0.1)
Basophils Relative: 1 %
Eosinophils Absolute: 0.5 10*3/uL (ref 0.0–0.5)
Eosinophils Relative: 5 %
HCT: 27.9 % — ABNORMAL LOW (ref 39.0–52.0)
Hemoglobin: 8.7 g/dL — ABNORMAL LOW (ref 13.0–17.0)
Immature Granulocytes: 1 %
Lymphocytes Relative: 11 %
Lymphs Abs: 1.1 10*3/uL (ref 0.7–4.0)
MCH: 20.5 pg — ABNORMAL LOW (ref 26.0–34.0)
MCHC: 31.2 g/dL (ref 30.0–36.0)
MCV: 65.8 fL — ABNORMAL LOW (ref 80.0–100.0)
Monocytes Absolute: 0.6 10*3/uL (ref 0.1–1.0)
Monocytes Relative: 7 %
Neutro Abs: 7.2 10*3/uL (ref 1.7–7.7)
Neutrophils Relative %: 75 %
Platelets: 319 10*3/uL (ref 150–400)
RBC: 4.24 MIL/uL (ref 4.22–5.81)
RDW: 18.4 % — ABNORMAL HIGH (ref 11.5–15.5)
WBC: 9.5 10*3/uL (ref 4.0–10.5)
nRBC: 0 % (ref 0.0–0.2)

## 2022-08-24 LAB — COMPREHENSIVE METABOLIC PANEL
ALT: 17 U/L (ref 0–44)
AST: 19 U/L (ref 15–41)
Albumin: 3.7 g/dL (ref 3.5–5.0)
Alkaline Phosphatase: 74 U/L (ref 38–126)
Anion gap: 7 (ref 5–15)
BUN: 37 mg/dL — ABNORMAL HIGH (ref 8–23)
CO2: 23 mmol/L (ref 22–32)
Calcium: 8.5 mg/dL — ABNORMAL LOW (ref 8.9–10.3)
Chloride: 107 mmol/L (ref 98–111)
Creatinine, Ser: 1.57 mg/dL — ABNORMAL HIGH (ref 0.61–1.24)
GFR, Estimated: 44 mL/min — ABNORMAL LOW (ref >60)
Glucose, Bld: 134 mg/dL — ABNORMAL HIGH (ref 70–99)
Potassium: 4.6 mmol/L (ref 3.5–5.1)
Sodium: 137 mmol/L (ref 135–145)
Total Bilirubin: 0.3 mg/dL (ref 0.3–1.2)
Total Protein: 6.8 g/dL (ref 6.5–8.1)

## 2022-08-24 LAB — MICROSCOPIC EXAMINATION
RBC, Urine: >30 /hpf — AB (ref 0–2)
WBC, UA: >30 /hpf — AB (ref 0–5)

## 2022-08-24 LAB — URINALYSIS, ROUTINE W REFLEX MICROSCOPIC
Bilirubin, UA: NEGATIVE
Glucose, UA: NEGATIVE
Nitrite, UA: NEGATIVE
Specific Gravity, UA: 1.015 (ref 1.005–1.030)
Urobilinogen, Ur: 0.2 mg/dL (ref 0.2–1.0)
pH, UA: 6 (ref 5.0–7.5)

## 2022-08-24 LAB — HEMOGLOBIN A1C
Hgb A1c MFr Bld: 5.3 % (ref 4.8–5.6)
Mean Plasma Glucose: 105.41 mg/dL

## 2022-08-24 LAB — PHOSPHORUS: Phosphorus: 3.2 mg/dL (ref 2.5–4.6)

## 2022-08-24 LAB — MAGNESIUM: Magnesium: 1.5 mg/dL — ABNORMAL LOW (ref 1.7–2.4)

## 2022-08-24 MED ORDER — SODIUM CHLORIDE 0.9 % IV SOLN: Freq: Once | INTRAVENOUS | Status: AC

## 2022-08-24 MED ORDER — PROCHLORPERAZINE MALEATE 10 MG PO TABS: 10.0000 mg | ORAL_TABLET | Freq: Four times a day (QID) | ORAL | 3 refills | Status: DC | PRN

## 2022-08-24 MED ORDER — SODIUM CHLORIDE 0.9% FLUSH
10.0000 mL | INTRAVENOUS | Status: DC | PRN
  Administered 2022-08-24: 10 mL via INTRAVENOUS

## 2022-08-24 MED ORDER — HEPARIN SOD (PORK) LOCK FLUSH 100 UNIT/ML IV SOLN
500.0000 [IU] | Freq: Once | INTRAVENOUS | Status: AC | PRN
  Administered 2022-08-24: 500 [IU]

## 2022-08-24 MED ORDER — PROCHLORPERAZINE MALEATE 10 MG PO TABS
10.0000 mg | ORAL_TABLET | Freq: Once | ORAL | Status: AC
  Administered 2022-08-24: 10 mg via ORAL
  Filled 2022-08-24: qty 1

## 2022-08-24 MED ORDER — SODIUM CHLORIDE 0.9 % IV SOLN
1.2700 mg/kg | Freq: Once | INTRAVENOUS | Status: AC
  Administered 2022-08-24: 100 mg via INTRAVENOUS
  Filled 2022-08-24: qty 10

## 2022-08-24 MED ORDER — SODIUM CHLORIDE 0.9 % IV SOLN
510.0000 mg | Freq: Once | INTRAVENOUS | Status: AC
  Administered 2022-08-24: 510 mg via INTRAVENOUS
  Filled 2022-08-24: qty 510

## 2022-08-24 MED ORDER — SODIUM CHLORIDE 0.9% FLUSH
10.0000 mL | INTRAVENOUS | Status: DC | PRN
  Administered 2022-08-24: 10 mL

## 2022-08-24 MED ORDER — MAGNESIUM SULFATE 2 GM/50ML IV SOLN
2.0000 g | Freq: Once | INTRAVENOUS | Status: AC
  Administered 2022-08-24: 2 g via INTRAVENOUS
  Filled 2022-08-24: qty 50

## 2022-08-24 MED ORDER — OXYCODONE HCL 10 MG PO TABS: 10.0000 mg | ORAL_TABLET | Freq: Three times a day (TID) | ORAL | 0 refills | Status: DC | PRN

## 2022-08-24 MED ORDER — POTASSIUM CHLORIDE IN NACL 20-0.9 MEQ/L-% IV SOLN
Freq: Once | INTRAVENOUS | Status: AC
  Filled 2022-08-24: qty 1000

## 2022-08-24 NOTE — Progress Notes (Signed)
Patient presents today for PADCEV, magnesium 1.5. Per Dr. Delton Coombes, patient is okay for treatment today with additional orders received for [facility] and house fluids. Patient tolerated chemotherapy with no complaints voiced. Side effects with management reviewed understanding verbalized.  Patient tolerated iron infusion with no complaints voiced. Port site clean and dry with no bruising or swelling noted at site. Good blood return noted before and after administration of chemotherapy. Band aid applied. Patient discharged satisfactory condition with no s/s of distress noted.

## 2022-08-24 NOTE — Progress Notes (Signed)
Patient has been examined by Dr. Katragadda, and vital signs and labs have been reviewed. ANC, Creatinine, LFTs, hemoglobin, and platelets are within treatment parameters per M.D. - pt may proceed with treatment.  Primary RN and pharmacy notified.  

## 2022-08-24 NOTE — Patient Instructions (Signed)
[place]  Discharge Instructions: Thank you for choosing [facility] to provide your oncology and hematology care.  If you have a lab appointment with the [facility], please come in thru the Main Entrance and check in at the main information desk.  Wear comfortable clothing and clothing appropriate for easy access to any Portacath or PICC line.   We strive to give you quality time with your provider. You may need to reschedule your appointment if you arrive late (15 or more minutes).  Arriving late affects you and other patients whose appointments are after yours.  Also, if you miss three or more appointments without notifying the office, you may be dismissed from the clinic at the provider's discretion.      For prescription refill requests, have your pharmacy contact our office and allow 72 hours for refills to be completed.    Today you received the following chemotherapy and/or immunotherapy agents PADCEV, house fluids, feraheme, return as scheduled.   To help prevent nausea and vomiting after your treatment, we encourage you to take your nausea medication as directed.  BELOW ARE SYMPTOMS THAT SHOULD BE REPORTED IMMEDIATELY: *FEVER GREATER THAN 100.4 F (38 C) OR HIGHER *CHILLS OR SWEATING *NAUSEA AND VOMITING THAT IS NOT CONTROLLED WITH YOUR NAUSEA MEDICATION *UNUSUAL SHORTNESS OF BREATH *UNUSUAL BRUISING OR BLEEDING *URINARY PROBLEMS (pain or burning when urinating, or frequent urination) *BOWEL PROBLEMS (unusual diarrhea, constipation, pain near the anus) TENDERNESS IN MOUTH AND THROAT WITH OR WITHOUT PRESENCE OF ULCERS (sore throat, sores in mouth, or a toothache) UNUSUAL RASH, SWELLING OR PAIN  UNUSUAL VAGINAL DISCHARGE OR ITCHING   Items with * indicate a potential emergency and should be followed up as soon as possible or go to the Emergency Department if any problems should occur.  Please show the CHEMOTHERAPY ALERT CARD or  IMMUNOTHERAPY ALERT CARD at check-in to the Emergency Department and triage nurse.  Should you have questions after your visit or need to cancel or reschedule your appointment, please contact [name] [phone]  and follow the prompts.  Office hours are 8:00 a.m. to 4:30 p.m. Monday - Friday. Please note that voicemails left after 4:00 p.m. may not be returned until the following business day.  We are closed weekends and major holidays. You have access to a nurse at all times for urgent questions. Please call the main number to the clinic [phone] and follow the prompts.  For any non-urgent questions, you may also contact your provider using MyChart. We now offer e-Visits for anyone 20 and older to request care online for non-urgent symptoms. For details visit [email or web address].   Also download the MyChart app! Go to the app store, search "MyChart", open the app, select [facility], and log in with your MyChart username and password.  Masks are optional in the cancer centers. If you would like for your care team to wear a mask while they are taking care of you, please let them know. You may have one support person who is at least 80 years old accompany you for your appointments.

## 2022-08-24 NOTE — Progress Notes (Signed)
Patients port flushed without difficulty.  Good blood return noted with no bruising or swelling noted at site.  Patient remains accessed for chemotherapy treatment.  

## 2022-08-24 NOTE — Progress Notes (Signed)
Joshua Osborne, [place] [address]   CLINIC:  Medical Oncology/Hematology  PCP:  Earney Mallet, MD [address] 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  He was started on enfortumab vedotin and pembrolizumab on 08/17/2022, cycle 1 day 1.  After last week's treatment, he had diarrhea 3 times.  He reported hip pains, knee and leg pains towards the groin.  He also had diffuse bone pains.  He was taking hydrocodone 1 tablet at nighttime for his chronic back pain which is not helping with his new pains.  REVIEW OF SYSTEMS:  Review of Systems  Gastrointestinal:  Positive for nausea.  Genitourinary:  Positive for hematuria.   Musculoskeletal:  Positive for back pain.  Neurological:  Positive for dizziness.  Psychiatric/Behavioral:  The patient is nervous/anxious.   All other systems reviewed and are negative.   PAST MEDICAL/SURGICAL HISTORY:  Past Medical History:  Diagnosis Date   Anemia    Anxiety    Arthritis    Bladder tumor    Cancer ([place])    hx of skin cancer, renal cancer   Diabetes mellitus without complication (HCC)    GERD (gastroesophageal reflux disease)    Heart murmur    as a child   History of kidney stones    Port-A-Cath in place 08/17/2022   Sleep apnea    cpap   Past Surgical History:  Procedure Laterality Date   bilateral dupreyen contracture surgery      CHOLECYSTECTOMY     CYSTOSCOPY N/A 05/30/2022   Procedure: CYSTOSCOPY WITH TRANSURETHRAL RESECTION OF THE LEFT URETERAL ORIFICE,   TRANSURETHRAL RESECTION OF BLADDER TUMOR;  Surgeon: Ceasar Mons, MD;  Location: [facility];  Service: Urology;  Laterality: N/A;   CYSTOSCOPY WITH RETROGRADE PYELOGRAM, URETEROSCOPY AND STENT PLACEMENT Left 04/26/2022   Procedure: CYSTOSCOPY WITH RETROGRADE PYELOGRAM, URETEROSCOPY AND STENT PLACEMENT;  Surgeon: Cleon Gustin, MD;  Location: [facility];  Service: Urology;  Laterality: Left;   IR IMAGING GUIDED PORT INSERTION  08/16/2022   LIVER BIOPSY     ROBOT ASSITED LAPAROSCOPIC NEPHROURETERECTOMY Left 05/30/2022   Procedure: XI ROBOT ASSITED LAPAROSCOPIC NEPHROURETERECTOMY;  Surgeon: Ceasar Mons, MD;  Location: [facility];  Service: Urology;  Laterality: Left;   TRANSURETHRAL RESECTION OF BLADDER TUMOR  04/26/2022   Procedure: TRANSURETHRAL RESECTION OF BLADDER TUMOR (TURBT);  Surgeon: Cleon Gustin, MD;  Location: [facility];  Service: Urology;;    SOCIAL HISTORY:  Social History   Socioeconomic History   Marital status: Married    Spouse name: Not on file   Number of children: Not on file   Years of education: Not on file   Highest education level: Not on file  Occupational History   Not on file  Tobacco Use   Smoking status: Former    Types: Cigarettes   Smokeless tobacco: Never  Vaping Use   Vaping Use: Never used  Substance and Sexual Activity   Alcohol use: Never   Drug use: Never   Sexual activity: Not Currently  Other Topics Concern   Not on file  Social  History Narrative   Not on file   Social Determinants of Health   Financial Resource Strain: Not on file  Food Insecurity: Not on file  Transportation Needs: Not on file  Physical Activity: Not on file  Stress: Not on file  Social Connections: Not on file  Intimate Partner Violence: Not on file    FAMILY HISTORY:  No family history on file.  CURRENT MEDICATIONS:  Current Outpatient Medications  Medication Sig Dispense Refill   acetaminophen (TYLENOL) 500 MG tablet Take 1,000 mg by  mouth every 6 (six) hours as needed for moderate pain.     citalopram (CELEXA) 20 MG tablet Take 20 mg by mouth daily.     diclofenac (VOLTAREN) 75 MG EC tablet Take 75 mg by mouth 2 (two) times daily.     docusate sodium (COLACE) 100 MG capsule Take 1 capsule (100 mg total) by mouth 2 (two) times daily as needed for mild constipation. (Patient not taking: Reported on 08/14/2022) 30 capsule 0   docusate sodium (COLACE) 250 MG capsule Take 250 mg by mouth 2 (two) times daily.     Enfortumab Vedotin-ejfv (PADCEV IV) Inject into the vein once a week. Days 1 and 8 every 21 days     fluorouracil (EFUDEX) 5 % cream Apply 1 Application topically 2 (two) times daily as needed (cancer spots).     HYDROcodone-acetaminophen (NORCO/VICODIN) 5-325 MG tablet Take 1 tablet by mouth 2 (two) times daily as needed for moderate pain.     latanoprost (XALATAN) 0.005 % ophthalmic solution Place 1 drop into both eyes at bedtime.     lidocaine-prilocaine (EMLA) cream Apply a small amount to port a cath site and cover with plastic wrap 1 hour prior to infusion 30 g 3   metFORMIN (GLUCOPHAGE) 500 MG tablet Take 500 mg by mouth 2 (two) times daily with a meal.     nitrofurantoin, macrocrystal-monohydrate, (MACROBID) 100 MG capsule Take 1 capsule (100 mg total) by mouth 2 (two) times daily. 14 capsule 1   omeprazole (PRILOSEC) 20 MG capsule Take 20 mg by mouth 2 (two) times daily.     oxyCODONE-acetaminophen (PERCOCET) 7.5-325 MG tablet Take 1 tablet by mouth every 4 (four) hours as needed for moderate pain. (Patient not taking: Reported on 08/14/2022) 30 tablet 0   PEMBROLIZUMAB IV Inject into the vein every 21 ( twenty-one) days.     promethazine (PHENERGAN) 12.5 MG tablet Take 1 tablet (12.5 mg total) by mouth at bedtime as needed. 30 tablet 2   simvastatin (ZOCOR) 40 MG tablet Take 40 mg by mouth daily.     tamsulosin (FLOMAX) 0.4 MG CAPS capsule Take 0.4 mg by mouth at bedtime.     timolol (BETIMOL) 0.5 % ophthalmic  solution Place 1 drop into both eyes daily.     tizanidine (ZANAFLEX) 2 MG capsule Take 2 mg by mouth at bedtime.     No current facility-administered medications for this visit.   Facility-Administered Medications Ordered in Other Visits  Medication Dose Route Frequency Provider Last Rate Last Admin   sodium chloride flush (NS) 0.9 % injection 10 mL  10 mL Intravenous PRN Derek Jack, MD   10 mL at 08/24/22 0806    ALLERGIES:  No Known Allergies  PHYSICAL EXAM:  Performance status (ECOG): 1 - Symptomatic but completely ambulatory  There were no vitals filed for this visit.  Wt Readings from Last 3 Encounters:  08/24/22 176 lb 6.4 oz (80 kg)  08/17/22 172 lb 3.2  oz (78.1 kg)  08/16/22 165 lb (74.8 kg)   Physical Exam Vitals reviewed.  Constitutional:      Appearance: Normal appearance.  Cardiovascular:     Rate and Rhythm: Normal rate and regular rhythm.     Pulses: Normal pulses.     Heart sounds: Normal heart sounds.  Pulmonary:     Effort: Pulmonary effort is normal.     Breath sounds: Normal breath sounds.  Neurological:     General: No focal deficit present.     Mental Status: He is alert and oriented to person, place, and time.  Psychiatric:        Mood and Affect: Mood normal.        Behavior: Behavior normal.    LABORATORY DATA:  I have reviewed the labs as listed.     Latest Ref Rng & Units 08/17/2022    7:52 AM 07/24/2022    2:25 PM 06/30/2022   11:01 AM  CBC  WBC 4.0 - 10.5 K/uL 10.6  9.3  13.6   Hemoglobin 13.0 - 17.0 g/dL 8.7  9.4  8.8   Hematocrit 39.0 - 52.0 % 28.1  30.4  27.6   Platelets 150 - 400 K/uL 344  340  363       Latest Ref Rng & Units 08/17/2022    7:52 AM 07/24/2022    2:25 PM 06/30/2022   11:01 AM  CMP  Glucose 70 - 99 mg/dL 136  122  156   BUN 8 - 23 mg/dL 33  41  32   Creatinine 0.61 - 1.24 mg/dL 1.60  1.84  1.30   Sodium 135 - 145 mmol/L 138  139  137   Potassium 3.5 - 5.1 mmol/L 4.6  4.8  5.3   Chloride 98 - 111  mmol/L 107  107  104   CO2 22 - 32 mmol/L '24  25  28   '$ Calcium 8.9 - 10.3 mg/dL 8.8  8.8  9.4   Total Protein 6.5 - 8.1 g/dL 6.7  7.2  7.1   Total Bilirubin 0.3 - 1.2 mg/dL 0.5  0.3  0.4   Alkaline Phos 38 - 126 U/L 72  66  70   AST 15 - 41 U/L '16  15  10   '$ ALT 0 - 44 U/L '20  16  8       '$ Component Value Date/Time   RBC 4.24 08/17/2022 0752   MCV 66.3 (L) 08/17/2022 0752   MCH 20.5 (L) 08/17/2022 0752   MCHC 31.0 08/17/2022 0752   RDW 18.9 (H) 08/17/2022 0752   LYMPHSABS 1.2 08/17/2022 0752   MONOABS 0.9 08/17/2022 0752   EOSABS 0.3 08/17/2022 0752   BASOSABS 0.1 08/17/2022 0752    DIAGNOSTIC IMAGING:  I have independently reviewed the scans and discussed with the patient. IR IMAGING GUIDED PORT INSERTION  Result Date: 08/16/2022 CLINICAL DATA:  Invasive bladder cancer, renal cell carcinoma post left nephrectomy, needs durable venous access for treatment regimen EXAM: TUNNELED PORT CATHETER PLACEMENT WITH ULTRASOUND AND FLUOROSCOPIC GUIDANCE FLUOROSCOPY: Radiation Exposure Index (as provided by the fluoroscopic device): 4 mGy air Kerma ANESTHESIA/SEDATION: Intravenous Fentanyl 155mg and Versed '2mg'$  were administered as conscious sedation during continuous monitoring of the patient's level of consciousness and physiological / cardiorespiratory status by the radiology RN, with a total moderate sedation time of 17 minutes. TECHNIQUE: The procedure, risks, benefits, and alternatives were explained to the patient. Questions regarding the procedure were encouraged and answered. The patient understands  and consents to the procedure. Patency of the right IJ vein was confirmed with ultrasound with image documentation. An appropriate skin site was determined. Skin site was marked. Region was prepped using maximum barrier technique including cap and mask, sterile gown, sterile gloves, large sterile sheet, and Chlorhexidine as cutaneous antisepsis. The region was infiltrated locally with 1% lidocaine.  Under real-time ultrasound guidance, the right IJ vein was accessed with a 21 gauge micropuncture needle; the needle tip within the vein was confirmed with ultrasound image documentation. Needle was exchanged over a 018 guidewire for transitional dilator, and vascular measurement was performed. A small incision was made on the right anterior chest wall and a subcutaneous pocket fashioned. The power-injectable port was positioned and its catheter tunneled to the right IJ dermatotomy site. The transitional dilator was exchanged over an Amplatz wire for a peel-away sheath, through which the port catheter, which had been trimmed to the appropriate length, was advanced and positioned under fluoroscopy with its tip at the cavoatrial junction. Spot chest radiograph confirms good catheter position and no pneumothorax. The port was flushed per protocol. The pocket was closed with deep interrupted and subcuticular continuous 3-0 Monocryl sutures. The incisions were covered with Dermabond then covered with a sterile dressing. The patient tolerated the procedure well. COMPLICATIONS: COMPLICATIONS None immediate IMPRESSION: Technically successful right IJ power-injectable port catheter placement. Ready for routine use. Electronically Signed   By: Lucrezia Europe M.D.   On: 08/16/2022 12:30   NM PET Image Restag (PS) Skull Base To Thigh  Result Date: 08/07/2022 CLINICAL DATA:  Subsequent treatment strategy for invasive bladder cancer. Renal cell carcinoma. LEFT nephrectomy. EXAM: NUCLEAR MEDICINE PET SKULL BASE TO THIGH TECHNIQUE: 8.48 mCi F-18 FDG was injected intravenously. Full-ring PET imaging was performed from the skull base to thigh after the radiotracer. CT data was obtained and used for attenuation correction and anatomic localization. Fasting blood glucose: 116 COMPARISON:  PET-CT 03/16/2022 FINDINGS: Mediastinal blood pool activity: SUV max 2.3 Liver activity: SUV max NA NECK: New hypermetabolic LEFT supraclavicular  node measures 13 mm (image 71/3) with SUV max equal 3.8. Incidental CT findings: None. CHEST: New hypermetabolic mediastinal lymph nodes. For example 13 mm prevascular node with SUV max equal 5.5 (image 93). High RIGHT paratracheal node measuring 12 mm (image 87) with SUV max equal 5.0. High LEFT paratracheal lymph node measuring 10 mm SUV max equal 4.1 on image 80). There several new pulmonary nodules. Example RIGHT lobe nodule measuring 8 mm image 120/3. RIGHT middle lobe nodule along the pleural surface measuring 12 mm image 120/3. RIGHT lower lobe nodule measuring 7 mm on image 128/3. Similar small nodule at the LEFT lung base on image 128. The largest of these nodules in the RIGHT middle lobe has metabolic activity SUV max equal 2.9 on image 119. The other smaller nodules do not have metabolic activity. Incidental CT findings: None. ABDOMEN/PELVIS: Post LEFT nephrectomy. New hypermetabolic nodal tissue LEFT aorta SUV max equal 5.1. No abnormal activity associated with the RIGHT kidney. No hypermetabolic tissue in the liver. Incidental CT findings: Prostate enlarged SKELETON: No focal hypermetabolic activity to suggest skeletal metastasis. Incidental CT findings: None. IMPRESSION: 1. Unfortunately evidence of new hypermetabolic mediastinal nodal metastasis and LEFT supraclavicular nodal metastasis. 2. Bilateral new small round pulmonary nodules. Largest nodule has metabolic activity. Findings consistent with new pulmonary metastasis. 3. Post LEFT nephrectomy. Hypermetabolic nodal tissue LEFT of aorta new from prior. 4. No hypermetabolic liver metastasis. Electronically Signed   By: Helane Gunther.D.  On: 08/07/2022 09:47     ASSESSMENT:  Neutrophilic leukocytosis and microcytic anemia: - Patient seen at the request of Dr. Macarthur Critchley for evaluation of leukocytosis and anemia. - CBCD (10/31/2021): WBC-15.4 (N 68%, L 16%, M 5%, E 8%, B 1%) Hb-10.3, MCV-61, PLT-452 - BCR/ABL PCR: Negative on 10/31/2021 - CBCD  (09/14/2021): WBC-14.6 (elevated ANC and AEC), Hb-10.5, MCV 62, PLT-384 - CBCD (08/29/2021): WBC-13, Hb-11, MCV-65, PLT-395 (elevated ANC and AEC-0.5 - He reports that he has been anemic all his life.  No prior history of blood transfusion.  No fevers or night sweats. - 22 pound weight loss since at least summer 2022.  Reports decreased appetite.  Reports bloating after eating. - In January 2023, he was hospitalized in [place] for bowel obstruction and bladder infection.  Nonsurgical management.  He received IV iron x1. - He had colonoscopy x3, last one in the 5 years negative.    Social/family history: - He lives at home with his wife.  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  Quit smoking 38 years ago. - No family history of leukemia.  Brother had lung cancer.  Maternal grandmother had cancer.  3.  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 - Left nephroureterectomy and lymph node biopsy on 05/30/2022 by Dr. Gilford Rile. - Pathology shows high-grade papillary urothelial carcinoma of the renal pelvis, 7.5 cm, tumor invades through muscularis into the renal parenchyma and peripelvic fat (PT3).  Urothelial carcinoma is identified at the proximal and mid left ureter vascular, ureteral and all margins of resection are negative for tumor.  Metastatic urothelial carcinoma in 1/2 lymph nodes.  Left para-aortic resection shows 0/7 lymph nodes involved. - PET scan (08/03/2022): Hypermetabolic left supraclavicular lymph node 13 mm, SUV 3.8.  13 mm prevascular lymph node, SUV three 5.5.  High right paratracheal lymph node 12 mm, SUV 5.0.  I left paratracheal lymph node 10 mm, SUV 4.1.  Right middle lobe nodule 12 mm.  Right lower lobe nodule 7 mm.  7 mm left lung base nodule.   Right middle lobe largest nodule with SUV 2.9.  New hypermetabolic nodal tissue left aorta SUV 5.1. 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 - Reviewed labs today which showed magnesium 1.5.  Creatinine is 1.57.  He will receive 1 L of fluid with electrolytes.  Phosphate was normal.  Blood sugar was 134.  HbA1c was 5.3. - RTC 2 weeks for follow-up prior to cycle 2.   2.  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  3.  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 - Ferritin is 148 and percent saturation 21. - He will receive Feraheme x1.  4.  Chronic back pain: - Continue hydrocodone as needed.  No metastatic disease in the bones. - For the new onset myalgias/bone pains, I will give him oxycodone 10 mg every 8 hours as needed.  5.  Low-grade urothelial carcinoma of the bladder: - TURBT on 05/30/2022: Small focus of noninvasive high-grade papillary urothelial carcinoma.  Negative for invasive  carcinoma. - He reportedly had 3 tumors in the bladder when Dr. Alyson Ingles did a cystoscopy yesterday.  He is having possible TURBT on Tuesday.  Orders placed this encounter:  No orders of the defined types were placed in this encounter.     Derek Jack, MD [place] [phone]

## 2022-08-24 NOTE — Patient Instructions (Addendum)
[place] at [facility] Discharge Instructions   You were seen and examined today by Dr. Delton Coombes.  He reviewed the results of your lab work. Your magnesium is low at 1.5 today. We will give you IV fluids with magnesium to replenish your magnesium. Your kidney function numbers are stable and your liver function tests are normal.   We will send you in stronger medication for pain for you. Oxycodone 10 mg every 8 hours as needed for pain. We will also send in a prescription for you for Compazine for nausea. You may use every 6 hours as needed.   We will proceed with your treatment today.  Return as scheduled.        Thank you for choosing [facility] at [facility] to provide your oncology and hematology care.  To afford each patient quality time with our provider, please arrive at least 15 minutes before your scheduled appointment time.   If you have a lab appointment with the [facility] please come in thru the Main Entrance and check in at the main information desk.  You need to re-schedule your appointment should you arrive 10 or more minutes late.  We strive to give you quality time with our providers, and arriving late affects you and other patients whose appointments are after yours.  Also, if you no show three or more times for appointments you may be dismissed from the clinic at the providers discretion.     Again, thank you for choosing [facility].  Our hope is that these requests will decrease the amount of time that you wait before being seen by our physicians.       _____________________________________________________________  Should you have questions after your visit to [facility], please contact our office at [phone] and follow the prompts.  Our office hours are 8:00 a.m. and 4:30 p.m. Monday - Friday.  Please note that voicemails left after 4:00 p.m. may not be returned until the following  business day.  We are closed weekends and major holidays.  You do have access to a nurse 24-7, just call the main number to the clinic [phone] and do not press any options, hold on the line and a nurse will answer the phone.    For prescription refill requests, have your pharmacy contact our office and allow 72 hours.    Due to Covid, you will need to wear a mask upon entering the hospital. If you do not have a mask, a mask will be given to you at the Main Entrance upon arrival. For doctor visits, patients may have 1 support person age 81 or older with them. For treatment visits, patients can not have anyone with them due to social distancing guidelines and our immunocompromised population.

## 2022-08-24 NOTE — Telephone Encounter (Signed)
I spoke with Joshua Osborne. We have discussed possible surgery dates and 08/29/2022 was agreed upon by all parties. Patient given information about surgery date, what to expect pre-operatively and post operatively.    We discussed that a pre-op nurse will be calling to set up the pre-op visit that will take place prior to surgery. Informed patient that our office will communicate any additional care to be provided after surgery.    Patients questions or concerns were discussed during our call. Advised to call our office should there be any additional information, questions or concerns that arise. Patient verbalized understanding.

## 2022-08-26 ENCOUNTER — Other Ambulatory Visit: Payer: Self-pay

## 2022-08-28 ENCOUNTER — Other Ambulatory Visit: Payer: Self-pay

## 2022-08-28 ENCOUNTER — Other Ambulatory Visit (HOSPITAL_COMMUNITY): Payer: MEDICARE

## 2022-08-30 NOTE — Patient Instructions (Addendum)
[facility]  08/30/2022     '@PREFPERIOPPHARMACY'$ @   Your procedure is scheduled on 09/06/2022.  Report to [facility] at 9:30 A.M.  Call this number if you have problems the morning of surgery:  [phone]  If you experience any cold or flu symptoms such as cough, fever, chills, shortness of breath, etc. between now and your scheduled surgery, please notify us at the above number.   Remember:  Do not eat or drink after midnight.      Take these medicines the morning of surgery with A SIP OF WATER : Celexa Norco Prilosec   Do Not take any diabetic medications the morning of the procedure.     Do not wear jewelry, make-up or nail polish.  Do not wear lotions, powders, or perfumes, or deodorant.  Do not shave 48 hours prior to surgery.  Men may shave face and neck.  Do not bring valuables to the hospital.  [facility] is not responsible for any belongings or valuables.  Contacts, dentures or bridgework may not be worn into surgery.  Leave your suitcase in the car.  After surgery it may be brought to your room.  For patients admitted to the hospital, discharge time will be determined by your treatment team.  Patients discharged the day of surgery will not be allowed to drive home.   Name and phone number of your driver:   Family Special instructions:  N/A  Please read over the following fact sheets that you were given. Care and Recovery After Surgery  Transurethral Resection of Bladder Tumor  Transurethral resection of a bladder tumor is the removal (resection) of cancerous tissue (tumor) from the inside wall of the bladder. The bladder is the organ that holds urine. The tumor is removed through the tube that carries urine out of the body (urethra). In a transurethral resection, a thin telescope with a light, a tiny camera, and an electric cutting edge (resectoscope) is passed through the urethra. In men, the opening of the urethra is at the end of the penis. In  women, it is just above the opening of the vagina. Tell a health care provider about: Any allergies you have. All medicines you are taking, including vitamins, herbs, eye drops, creams, and over-the-counter medicines. Any problems you or family members have had with anesthetic medicines. Any bleeding problems you have. Any surgeries you have had. Any medical conditions you have, including recent urinary tract infections. Whether you are pregnant or may be pregnant. What are the risks? Generally, this is a safe procedure. However, problems may occur, including: Infection. Bleeding. Allergic reactions to medicines. Damage to nearby structures or organs. Difficulty urinating from blockage of the urethra or not being able to urinate (urinary retention). Deep vein thrombosis. This is a blood clot that can develop in your leg. Recurring cancer. What happens before the procedure? When to stop eating and drinking Follow instructions from your health care provider about what you may eat and drink before your procedure. These may include: 8 hours before your procedure Stop eating most foods. Do not eat meat, fried foods, or fatty foods. Eat only light foods, such as toast or crackers. All liquids are okay except energy drinks and alcohol. 6 hours before your procedure Stop eating. Drink only clear liquids, such as water, clear fruit juice, black coffee, plain tea, and sports drinks. Do not drink energy drinks or alcohol. 2 hours before your procedure Stop drinking all liquids. You may be allowed to  take medicines with small sips of water. Medicines Ask your health care provider about: Changing or stopping your regular medicines. This is especially important if you are taking diabetes medicines or blood thinners. Taking medicines such as aspirin and ibuprofen. These medicines can thin your blood. Do not take these medicines unless your health care provider tells you to take them. Taking  over-the-counter medicines, vitamins, herbs, and supplements. General instructions If you will be going home right after the procedure, plan to have a responsible adult: Take you home from the hospital or clinic. You will not be allowed to drive. Care for you for the time you are told. Ask your health care provider what steps will be taken to help prevent infection. These steps may include: Washing skin with a germ-killing soap. Taking antibiotic medicine. Do not use any products that contain nicotine or tobacco for at least 4 weeks before the procedure. These products include cigarettes, chewing tobacco, and vaping devices, such as e-cigarettes. If you need help quitting, ask your health care provider. What happens during the procedure? An IV will be inserted into one of your veins. You will be given one or more of the following: A medicine to help you relax (sedative). A medicine that is injected into your spine to numb the area below and slightly above the injection site (spinal anesthetic). A medicine that is injected into an area of your body to numb everything below the injection site (regional anesthetic). A medicine to make you fall asleep (general anesthetic). Your legs will be placed in foot rests (stirrups) to open your legs and bend your knees. The resectoscope will be passed through your urethra and into your bladder. The part of your bladder with the tumor will be resected by the cutting edge of the resectoscope. Fluid will be passed to rinse out the cut tissues (irrigation). The resectoscope will then be taken out. A small, thin tube (catheter) will be passed through your urethra and into your bladder. The catheter will drain urine into a bag outside of your body. The procedure may vary among health care providers and hospitals. What happens after the procedure? Your blood pressure, heart rate, breathing rate, and blood oxygen level will be monitored until you leave the  hospital or clinic. You may continue to receive fluids and medicines through an IV. You will be given pain medicine to relieve pain. You will have a catheter to drain your urine. The amount of urine will be measured. If you have blood in your urine, your bladder may be rinsed out by passing fluid through your catheter. You will be encouraged to walk as soon as you can. You may have to wear compression stockings. These stockings help to prevent blood clots and reduce swelling in your legs. If you were given a sedative during the procedure, it can affect you for several hours. Do not drive or operate machinery until your health care provider says that it is safe. Summary Transurethral resection of a bladder tumor is the removal (resection) of a cancerous growth (tumor) on the inside wall of the bladder. To do this procedure, your health care provider uses a thin telescope with a light, a tiny camera, and an electric cutting edge (resectoscope) that is guided to your bladder through your urethra. The part of your bladder that is affected by the tumor will be resected by the cutting edge of the resectoscope. A catheter will be passed through your urethra and into your bladder. The catheter will drain  urine into a bag outside of your body. If you will be going home right after the procedure, plan to have a responsible adult take you home from the hospital or clinic. You will not be allowed to drive. This information is not intended to replace advice given to you by your health care provider. Make sure you discuss any questions you have with your health care provider. Document Revised: 09/30/2021 Document Reviewed: 09/30/2021 Elsevier Patient Education  [place] Anesthesia, Adult General anesthesia is the use of medicine to make you fall asleep (unconscious) for a medical procedure. General anesthesia must be used for certain procedures. It is often recommended for surgery or  procedures that: Last a long time. Require you to be still or in an unusual position. Are major and can cause blood loss. Affect your breathing. The medicines used for general anesthesia are called general anesthetics. During general anesthesia, these medicines are given along with medicines that: Prevent pain. Control your blood pressure. Relax your muscles. Prevent nausea and vomiting after the procedure. Tell a health care provider about: Any allergies you have. All medicines you are taking, including vitamins, herbs, eye drops, creams, and over-the-counter medicines. Your history of any: Medical conditions you have, including: High blood pressure. Bleeding problems. Diabetes. Heart or lung conditions, such as: Heart failure. Sleep apnea. Asthma. Chronic obstructive pulmonary disease (COPD). Current or recent illnesses, such as: Upper respiratory, chest, or ear infections. Cough or fever. Tobacco or drug use, including marijuana or alcohol use. Depression or anxiety. Surgeries and types of anesthetics you have had. Problems you or family members have had with anesthetic medicines. Whether you are pregnant or may be pregnant. Whether you have any chipped or loose teeth, dentures, caps, bridgework, or issues with your mouth, swallowing, or choking. What are the risks? Your health care provider will talk with you about risks. These may include: Allergic reaction to the medicines. Lung and heart problems. Inhaling food or liquid from the stomach into the lungs (aspiration). Nerve injury. Injury to the lips, mouth, teeth, or gums. Stroke. Waking up during your procedure and being unable to move. This is rare. These problems are more likely to develop if you are having a major surgery or if you have an advanced or serious medical condition. You can prevent some of these complications by answering all of your health care provider's questions thoroughly and by following all  instructions before your procedure. General anesthesia can cause side effects, including: Nausea or vomiting. A sore throat or hoarseness from the breathing tube. Wheezing or coughing. Shaking chills or feeling cold. Body aches. Sleepiness. Confusion, agitation (delirium), or anxiety. What happens before the procedure? When to stop eating and drinking Follow instructions from your health care provider about what you may eat and drink before your procedure. If you do not follow your health care provider's instructions, your procedure may be delayed or canceled. Medicines Ask your health care provider about: Changing or stopping your regular medicines. These include any diabetes medicines or blood thinners you take. Taking medicines such as aspirin and ibuprofen. These medicines can thin your blood. Do not take them unless your health care provider tells you to. Taking over-the-counter medicines, vitamins, herbs, and supplements. General instructions Do not use any products that contain nicotine or tobacco for at least 4 weeks before the procedure. These products include cigarettes, chewing tobacco, and vaping devices, such as e-cigarettes. If you need help quitting, ask your health care provider. If you brush your  teeth on the morning of the procedure, make sure to spit out all of the water and toothpaste. If told by your health care provider, bring your sleep apnea device with you to surgery (if applicable). If you will be going home right after the procedure, plan to have a responsible adult: Take you home from the hospital or clinic. You will not be allowed to drive. Care for you for the time you are told. What happens during the procedure?  An IV will be inserted into one of your veins. You will be given one or more of the following through a face mask or IV: A sedative. This helps you relax. Anesthesia. This will: Numb certain areas of your body. Make you fall asleep for  surgery. After you are unconscious, a breathing tube may be inserted down your throat to help you breathe. This will be removed before you wake up. An anesthesia provider, such as an anesthesiologist, will stay with you throughout your procedure. The anesthesia provider will: Keep you comfortable and safe by continuing to give you medicines and adjusting the amount of medicine that you get. Monitor your blood pressure, heart rate, and oxygen levels to make sure that the anesthetics do not cause any problems. The procedure may vary among health care providers and hospitals. What happens after the procedure? Your blood pressure, temperature, heart rate, breathing rate, and blood oxygen level will be monitored until you leave the hospital or clinic. You will wake up in a recovery area. You may wake up slowly. You may be given medicine to help you with pain, nausea, or any other side effects from the anesthesia. Summary General anesthesia is the use of medicine to make you fall asleep (unconscious) for a medical procedure. Follow your health care provider's instructions about when to stop eating, drinking, or taking certain medicines before your procedure. Plan to have a responsible adult take you home from the hospital or clinic. This information is not intended to replace advice given to you by your health care provider. Make sure you discuss any questions you have with your health care provider. Document Revised: 12/22/2021 Document Reviewed: 12/22/2021 Elsevier Patient Education  [place].

## 2022-09-01 ENCOUNTER — Other Ambulatory Visit: Payer: Self-pay

## 2022-09-03 ENCOUNTER — Encounter: Payer: Self-pay | Admitting: Urology

## 2022-09-03 NOTE — Patient Instructions (Signed)

## 2022-09-04 ENCOUNTER — Encounter: Payer: Self-pay | Admitting: *Deleted

## 2022-09-04 ENCOUNTER — Ambulatory Visit: Payer: MEDICARE | Admitting: Urology

## 2022-09-04 ENCOUNTER — Encounter (HOSPITAL_COMMUNITY): Payer: Self-pay

## 2022-09-04 ENCOUNTER — Encounter (HOSPITAL_COMMUNITY)
Admission: RE | Admit: 2022-09-04 | Discharge: 2022-09-04 | Disposition: A | Discharge status: Home / Self Care | Payer: MEDICARE | Source: Ambulatory Visit | Attending: Urology | Admitting: Urology

## 2022-09-06 ENCOUNTER — Ambulatory Visit (HOSPITAL_COMMUNITY)
Admission: RE | Admit: 2022-09-06 | Discharge: 2022-09-06 | Disposition: A | Discharge status: Home / Self Care | Payer: MEDICARE | Source: Ambulatory Visit | Attending: Urology | Admitting: Urology

## 2022-09-06 ENCOUNTER — Ambulatory Visit (HOSPITAL_BASED_OUTPATIENT_CLINIC_OR_DEPARTMENT_OTHER): Payer: MEDICARE | Admitting: Anesthesiology

## 2022-09-06 ENCOUNTER — Encounter (HOSPITAL_COMMUNITY): Payer: Self-pay | Admitting: Urology

## 2022-09-06 ENCOUNTER — Encounter (HOSPITAL_COMMUNITY)
Admission: RE | Disposition: A | Discharge status: Home / Self Care | Payer: Self-pay | Source: Ambulatory Visit | Attending: Urology

## 2022-09-06 ENCOUNTER — Ambulatory Visit (HOSPITAL_COMMUNITY): Payer: MEDICARE | Admitting: Anesthesiology

## 2022-09-06 DIAGNOSIS — Z85528 Personal history of other malignant neoplasm of kidney: Secondary | ICD-10-CM | POA: Diagnosis not present

## 2022-09-06 DIAGNOSIS — D759 Disease of blood and blood-forming organs, unspecified: Secondary | ICD-10-CM | POA: Insufficient documentation

## 2022-09-06 DIAGNOSIS — Z7984 Long term (current) use of oral hypoglycemic drugs: Secondary | ICD-10-CM | POA: Insufficient documentation

## 2022-09-06 DIAGNOSIS — E119 Type 2 diabetes mellitus without complications: Secondary | ICD-10-CM | POA: Insufficient documentation

## 2022-09-06 DIAGNOSIS — C679 Malignant neoplasm of bladder, unspecified: Secondary | ICD-10-CM | POA: Diagnosis present

## 2022-09-06 DIAGNOSIS — M1990 Unspecified osteoarthritis, unspecified site: Secondary | ICD-10-CM | POA: Diagnosis not present

## 2022-09-06 DIAGNOSIS — Z905 Acquired absence of kidney: Secondary | ICD-10-CM | POA: Diagnosis not present

## 2022-09-06 DIAGNOSIS — D649 Anemia, unspecified: Secondary | ICD-10-CM | POA: Insufficient documentation

## 2022-09-06 HISTORY — PX: TRANSURETHRAL RESECTION OF BLADDER TUMOR: SHX2575

## 2022-09-06 HISTORY — PX: CYSTOSCOPY: SHX5120

## 2022-09-06 LAB — GLUCOSE, CAPILLARY: Glucose-Capillary: 133 mg/dL — ABNORMAL HIGH (ref 70–99)

## 2022-09-06 SURGERY — CYSTOSCOPY
Anesthesia: General | Site: Bladder

## 2022-09-06 MED ORDER — FENTANYL CITRATE (PF) 100 MCG/2ML IJ SOLN
INTRAMUSCULAR | Status: DC | PRN
  Administered 2022-09-06 (×2): 50 ug via INTRAVENOUS

## 2022-09-06 MED ORDER — CHLORHEXIDINE GLUCONATE 0.12 % MT SOLN
15.0000 mL | Freq: Once | OROMUCOSAL | Status: AC
  Administered 2022-09-06: 15 mL via OROMUCOSAL
  Filled 2022-09-06: qty 15

## 2022-09-06 MED ORDER — DEXAMETHASONE SODIUM PHOSPHATE 10 MG/ML IJ SOLN
INTRAMUSCULAR | Status: AC
  Filled 2022-09-06: qty 2

## 2022-09-06 MED ORDER — STERILE WATER FOR IRRIGATION IR SOLN
Status: DC | PRN
  Administered 2022-09-06: 500 mL

## 2022-09-06 MED ORDER — ORAL CARE MOUTH RINSE: 15.0000 mL | Freq: Once | OROMUCOSAL | Status: AC

## 2022-09-06 MED ORDER — LIDOCAINE 2% (20 MG/ML) 5 ML SYRINGE
INTRAMUSCULAR | Status: DC | PRN
  Administered 2022-09-06: 80 mg via INTRAVENOUS

## 2022-09-06 MED ORDER — ONDANSETRON HCL 4 MG/2ML IJ SOLN
INTRAMUSCULAR | Status: DC | PRN
  Administered 2022-09-06: 4 mg via INTRAVENOUS

## 2022-09-06 MED ORDER — ONDANSETRON HCL 4 MG/2ML IJ SOLN: 4.0000 mg | Freq: Once | INTRAMUSCULAR | Status: DC | PRN

## 2022-09-06 MED ORDER — SODIUM CHLORIDE 0.9 % IR SOLN
Status: DC | PRN
  Administered 2022-09-06 (×2): 3000 mL

## 2022-09-06 MED ORDER — ROCURONIUM BROMIDE 10 MG/ML (PF) SYRINGE
PREFILLED_SYRINGE | INTRAVENOUS | Status: AC
  Filled 2022-09-06: qty 20

## 2022-09-06 MED ORDER — LACTATED RINGERS IV SOLN: INTRAVENOUS | Status: DC

## 2022-09-06 MED ORDER — LIDOCAINE HCL (PF) 2 % IJ SOLN
INTRAMUSCULAR | Status: AC
  Filled 2022-09-06: qty 5

## 2022-09-06 MED ORDER — HYDROMORPHONE HCL 1 MG/ML IJ SOLN
0.2500 mg | INTRAMUSCULAR | Status: DC | PRN
  Administered 2022-09-06 (×2): 0.5 mg via INTRAVENOUS
  Filled 2022-09-06 (×2): qty 0.5

## 2022-09-06 MED ORDER — PHENYLEPHRINE 80 MCG/ML (10ML) SYRINGE FOR IV PUSH (FOR BLOOD PRESSURE SUPPORT)
PREFILLED_SYRINGE | INTRAVENOUS | Status: DC | PRN
  Administered 2022-09-06: 160 ug via INTRAVENOUS

## 2022-09-06 MED ORDER — PROPOFOL 10 MG/ML IV BOLUS
INTRAVENOUS | Status: DC | PRN
  Administered 2022-09-06: 140 mg via INTRAVENOUS
  Administered 2022-09-06: 60 mg via INTRAVENOUS

## 2022-09-06 MED ORDER — PROPOFOL 10 MG/ML IV BOLUS
INTRAVENOUS | Status: AC
  Filled 2022-09-06: qty 20

## 2022-09-06 MED ORDER — ALBUMIN HUMAN 5 % IV SOLN
INTRAVENOUS | Status: AC
  Filled 2022-09-06: qty 250

## 2022-09-06 MED ORDER — DEXAMETHASONE SODIUM PHOSPHATE 10 MG/ML IJ SOLN
INTRAMUSCULAR | Status: DC | PRN
  Administered 2022-09-06: 10 mg via INTRAVENOUS

## 2022-09-06 MED ORDER — OXYCODONE HCL 10 MG PO TABS: 10.0000 mg | ORAL_TABLET | Freq: Three times a day (TID) | ORAL | 0 refills | Status: DC | PRN

## 2022-09-06 MED ORDER — CEFAZOLIN SODIUM-DEXTROSE 2-4 GM/100ML-% IV SOLN
2.0000 g | INTRAVENOUS | Status: AC
  Administered 2022-09-06: 2 g via INTRAVENOUS
  Filled 2022-09-06: qty 100

## 2022-09-06 MED ORDER — FENTANYL CITRATE (PF) 100 MCG/2ML IJ SOLN
INTRAMUSCULAR | Status: AC
  Filled 2022-09-06: qty 2

## 2022-09-06 MED ORDER — ROCURONIUM BROMIDE 10 MG/ML (PF) SYRINGE
PREFILLED_SYRINGE | INTRAVENOUS | Status: DC | PRN
  Administered 2022-09-06: 60 mg via INTRAVENOUS

## 2022-09-06 MED ORDER — SUGAMMADEX SODIUM 200 MG/2ML IV SOLN
INTRAVENOUS | Status: DC | PRN
  Administered 2022-09-06: 160 mg via INTRAVENOUS

## 2022-09-06 SURGICAL SUPPLY — 25 items
BAG DRAIN URO TABLE W/ADPT NS (BAG) ×1 IMPLANT
BAG DRN 8 ADPR NS SKTRN CSTL (BAG) ×1
BAG DRN RND TRDRP ANRFLXCHMBR (UROLOGICAL SUPPLIES) ×1
BAG HAMPER (MISCELLANEOUS) ×1 IMPLANT
BAG URINE DRAIN 2000ML AR STRL (UROLOGICAL SUPPLIES) ×1 IMPLANT
CATH FOLEY 3WAY 30CC 22F (CATHETERS) IMPLANT
CLOTH BEACON ORANGE TIMEOUT ST (SAFETY) ×1 IMPLANT
ELECT LOOP 22F BIPOLAR SML (ELECTROSURGICAL) ×1
ELECTRODE LOOP 22F BIPOLAR SML (ELECTROSURGICAL) ×1 IMPLANT
GLOVE BIO SURGEON STRL SZ8 (GLOVE) ×1 IMPLANT
GLOVE BIOGEL PI IND STRL 7.0 (GLOVE) ×2 IMPLANT
GLOVE ECLIPSE 6.5 STRL STRAW (GLOVE) IMPLANT
GOWN STRL REUS W/TWL LRG LVL3 (GOWN DISPOSABLE) ×2 IMPLANT
GOWN STRL REUS W/TWL XL LVL3 (GOWN DISPOSABLE) ×1 IMPLANT
IV NS IRRIG 3000ML ARTHROMATIC (IV SOLUTION) ×2 IMPLANT
KIT TURNOVER CYSTO (KITS) ×1 IMPLANT
MANIFOLD NEPTUNE II (INSTRUMENTS) ×1 IMPLANT
PACK CYSTO (CUSTOM PROCEDURE TRAY) ×1 IMPLANT
PAD ARMBOARD 7.5X6 YLW CONV (MISCELLANEOUS) ×1 IMPLANT
PLUG CATH AND CAP STER (CATHETERS) IMPLANT
SYR 30ML LL (SYRINGE) ×1 IMPLANT
SYR TOOMEY IRRIG 70ML (MISCELLANEOUS) ×1
SYRINGE TOOMEY IRRIG 70ML (MISCELLANEOUS) ×1 IMPLANT
TOWEL OR 17X26 4PK STRL BLUE (TOWEL DISPOSABLE) ×1 IMPLANT
WATER STERILE IRR 500ML POUR (IV SOLUTION) ×1 IMPLANT

## 2022-09-06 NOTE — Op Note (Signed)
.  Preoperative diagnosis: bladder tumor  Postoperative diagnosis: Same  Procedure: 1 cystoscopy 2. Transurethral resection of bladder tumor, large  Attending: Rosie Fate  Anesthesia: General  Estimated blood loss: Minimal  Drains: 22 French foley  Specimens: Bladder Tumor  Antibiotics: Ancef  Findings: over 20 2-5cm papillary bladder tumor.  Right ureteral orifice identified. Left ureteral orifice surgically absent.  Indications: Patient is a 81 year old male with a history of bladder tumor and gross hematuria.  After discussing treatment options, they decided proceed with transurethral resection of a bladder tumor.  Procedure her in detail: The patient was brought to the operating room and a brief timeout was done to ensure correct patient, correct procedure, correct site.  General anesthesia was administered patient was placed in dorsal lithotomy position.  Their genitalia was then prepped and draped in usual sterile fashion.  A rigid 62 French cystoscope was passed in the urethra and the bladder.  Bladder was inspected and we noted numerous bladder tumors.  We then removed the cystoscope and placed a resectoscope into the bladder. . Using the bipolar resectoscope we removed the bladder tumors down to the base. Hemostasis was then obtained with electrocautery. We then removed the bladder tumor chips and sent them for pathology. We then re-inspected the bladder and found no residula bleeding.  the bladder was then drained, a 22 French foley was placed and this concluded the procedure which was well tolerated by patient.  Complications: None  Condition: Stable, extubated, transferred to PACU  Plan: Patient is to be discharged home and followup in 5 days for foley catheter removal and pathology discussion.

## 2022-09-06 NOTE — Transfer of Care (Signed)
Immediate Anesthesia Transfer of Care Note  Patient: Joshua Osborne  Procedure(s) Performed: CYSTOSCOPY (Bladder) TRANSURETHRAL RESECTION OF BLADDER TUMOR (TURBT) (Bladder)  Patient Location: PACU  Anesthesia Type:General  Level of Consciousness: awake, alert , and oriented  Airway & Oxygen Therapy: Patient Spontanous Breathing and Patient connected to nasal cannula oxygen  Post-op Assessment: Report given to RN, Post -op Vital signs reviewed and stable, Patient moving all extremities X 4, and Patient able to stick tongue midline  Post vital signs: Reviewed  Last Vitals:  Vitals Value Taken Time  BP 165/66 09/06/22 1310  Temp 98.3   Pulse 85 09/06/22 1311  Resp 16 09/06/22 1311  SpO2 100 % 09/06/22 1311  Vitals shown include unvalidated device data.  Last Pain:  Vitals:   09/06/22 0954  TempSrc: Oral  PainSc: 0-No pain      Patients Stated Pain Goal: 7 (38/25/05 3976)  Complications:  Encounter Notable Events  Notable Event Outcome Phase Comment  Difficult to intubate - expected  Intraprocedure Filed from anesthesia note documentation.

## 2022-09-06 NOTE — Anesthesia Preprocedure Evaluation (Signed)
Anesthesia Evaluation  Patient identified by MRN, date of birth, ID band Patient awake    Reviewed: Allergy & Precautions, NPO status , Patient's Chart, lab work & pertinent test results  Airway Mallampati: III  TM Distance: >3 FB Neck ROM: Full    Dental  (+) Dental Advisory Given, Missing, Chipped, Caps, Poor Dentition   Pulmonary sleep apnea and Continuous Positive Airway Pressure Ventilation , former smoker   Pulmonary exam normal breath sounds clear to auscultation       Cardiovascular Normal cardiovascular exam+ Valvular Problems/Murmurs  Rhythm:Regular Rate:Normal  1. Left ventricular ejection fraction, by estimation, is 60 to 65%. The  left ventricle has normal function. The left ventricle has no regional  wall motion abnormalities. There is mild left ventricular hypertrophy.  Left ventricular diastolic parameters  were normal.  2. Right ventricular systolic function is normal. The right ventricular  size is normal. There is normal pulmonary artery systolic pressure.  3. The mitral valve is normal in structure. Trivial mitral valve  regurgitation. No evidence of mitral stenosis.  4. The tricuspid valve is abnormal.  5. The aortic valve is tricuspid. There is mild calcification of the  aortic valve. There is mild thickening of the aortic valve. Aortic valve  regurgitation is not visualized. No aortic stenosis is present.  6. The inferior vena cava is normal in size with greater than 50%  respiratory variability, suggesting right atrial    Neuro/Psych  PSYCHIATRIC DISORDERS Anxiety     negative neurological ROS     GI/Hepatic Neg liver ROS,GERD  Medicated and Controlled,,  Endo/Other  diabetes, Well Controlled, Type 2, Oral Hypoglycemic Agents    Renal/GU Renal InsufficiencyRenal disease (left nephroureterectomy, renal cell carcinoma) Bladder dysfunction (Bladder cancer)      Musculoskeletal  (+) Arthritis  , Osteoarthritis,    Abdominal   Peds negative pediatric ROS (+)  Hematology  (+) Blood dyscrasia, anemia   Anesthesia Other Findings   Reproductive/Obstetrics negative OB ROS                             Anesthesia Physical Anesthesia Plan  ASA: 3  Anesthesia Plan: General   Post-op Pain Management: Dilaudid IV   Induction: Intravenous  PONV Risk Score and Plan: 3 and Ondansetron  Airway Management Planned: Oral ETT  Additional Equipment:   Intra-op Plan:   Post-operative Plan: Extubation in OR  Informed Consent: I have reviewed the patients History and Physical, chart, labs and discussed the procedure including the risks, benefits and alternatives for the proposed anesthesia with the patient or authorized representative who has indicated his/her understanding and acceptance.     Dental advisory given  Plan Discussed with: CRNA and Surgeon  Anesthesia Plan Comments:         Anesthesia Quick Evaluation

## 2022-09-06 NOTE — Interval H&P Note (Signed)
History and Physical Interval Note:  09/06/2022 12:10 PM  Joshua Osborne  has presented today for surgery, with the diagnosis of bladder cancer.  The various methods of treatment have been discussed with the patient and family. After consideration of risks, benefits and other options for treatment, the patient has consented to  Procedure(s): CYSTOSCOPY (N/A) TRANSURETHRAL RESECTION OF BLADDER TUMOR (TURBT) (N/A) as a surgical intervention.  The patient's history has been reviewed, patient examined, no change in status, stable for surgery.  I have reviewed the patient's chart and labs.  Questions were answered to the patient's satisfaction.     [name]

## 2022-09-06 NOTE — Anesthesia Procedure Notes (Signed)
Procedure Name: Intubation Date/Time: 09/06/2022 12:25 PM  Performed by: Myna Bright, CRNAPre-anesthesia Checklist: Patient identified, Emergency Drugs available, Suction available and Patient being monitored Patient Re-evaluated:Patient Re-evaluated prior to induction Oxygen Delivery Method: Circle system utilized Preoxygenation: Pre-oxygenation with 100% oxygen Induction Type: IV induction Ventilation: Mask ventilation without difficulty and Oral airway inserted - appropriate to patient size Laryngoscope Size: Glidescope and 4 Tube type: Oral Tube size: 7.5 mm Number of attempts: 2 Airway Equipment and Method: Stylet and Video-laryngoscopy Placement Confirmation: ETT inserted through vocal cords under direct vision, positive ETCO2 and breath sounds checked- equal and bilateral Secured at: 22 cm Tube secured with: Tape Dental Injury: Teeth and Oropharynx as per pre-operative assessment  Difficulty Due To: Difficulty was anticipated and Difficult Airway- due to anterior larynx Comments: Easy mask airway. DL with MAC 4, grade III view, attempted to place bougie without success. Resumed mask ventilation. DL with Glidescope, good view with Glide. Atraumatic oral intubation.

## 2022-09-06 NOTE — Anesthesia Postprocedure Evaluation (Signed)
Anesthesia Post Note  Patient: Joshua Osborne  Procedure(s) Performed: CYSTOSCOPY (Bladder) TRANSURETHRAL RESECTION OF BLADDER TUMOR (TURBT) (Bladder)  Patient location during evaluation: Phase II Anesthesia Type: General Level of consciousness: awake and alert and oriented Pain management: pain level controlled Vital Signs Assessment: post-procedure vital signs reviewed and stable Respiratory status: spontaneous breathing, nonlabored ventilation and respiratory function stable Cardiovascular status: blood pressure returned to baseline and stable Postop Assessment: no apparent nausea or vomiting Anesthetic complications: yes  Encounter Notable Events  Notable Event Outcome Phase Comment  Difficult to intubate - expected  Intraprocedure Filed from anesthesia note documentation.     Last Vitals:  Vitals:   09/06/22 1345 09/06/22 1409  BP: (!) 158/66 (!) 163/69  Pulse: 81 87  Resp: 11 14  Temp:  36.6 C  SpO2: 100% 99%    Last Pain:  Vitals:   09/06/22 1409  TempSrc: Oral  PainSc: 1                  Tyianna Menefee C Jie Stickels

## 2022-09-06 NOTE — Progress Notes (Signed)
Pt sent home with leg bag and catheter care supplies. Pt verbalized understanding of cath care at home.

## 2022-09-07 ENCOUNTER — Inpatient Hospital Stay: Payer: MEDICARE

## 2022-09-07 ENCOUNTER — Other Ambulatory Visit: Payer: Self-pay

## 2022-09-07 ENCOUNTER — Inpatient Hospital Stay (HOSPITAL_BASED_OUTPATIENT_CLINIC_OR_DEPARTMENT_OTHER): Payer: MEDICARE | Admitting: Hematology

## 2022-09-07 VITALS — BP 111/51 | HR 70 | Temp 98.0°F | Resp 18

## 2022-09-07 DIAGNOSIS — C652 Malignant neoplasm of left renal pelvis: Secondary | ICD-10-CM | POA: Diagnosis not present

## 2022-09-07 DIAGNOSIS — Z95828 Presence of other vascular implants and grafts: Secondary | ICD-10-CM

## 2022-09-07 LAB — CBC WITH DIFFERENTIAL/PLATELET
Abs Immature Granulocytes: 0.11 10*3/uL — ABNORMAL HIGH (ref 0.00–0.07)
Basophils Absolute: 0 10*3/uL (ref 0.0–0.1)
Basophils Relative: 0 %
Eosinophils Absolute: 0 10*3/uL (ref 0.0–0.5)
Eosinophils Relative: 0 %
HCT: 28.7 % — ABNORMAL LOW (ref 39.0–52.0)
Hemoglobin: 8.7 g/dL — ABNORMAL LOW (ref 13.0–17.0)
Immature Granulocytes: 1 %
Lymphocytes Relative: 5 %
Lymphs Abs: 0.7 10*3/uL (ref 0.7–4.0)
MCH: 20 pg — ABNORMAL LOW (ref 26.0–34.0)
MCHC: 30.3 g/dL (ref 30.0–36.0)
MCV: 65.8 fL — ABNORMAL LOW (ref 80.0–100.0)
Monocytes Absolute: 1 10*3/uL (ref 0.1–1.0)
Monocytes Relative: 7 %
Neutro Abs: 12.4 10*3/uL — ABNORMAL HIGH (ref 1.7–7.7)
Neutrophils Relative %: 87 %
Platelets: 399 10*3/uL (ref 150–400)
RBC: 4.36 MIL/uL (ref 4.22–5.81)
RDW: 18.6 % — ABNORMAL HIGH (ref 11.5–15.5)
WBC: 14.2 10*3/uL — ABNORMAL HIGH (ref 4.0–10.5)
nRBC: 0.1 % (ref 0.0–0.2)

## 2022-09-07 LAB — COMPREHENSIVE METABOLIC PANEL
ALT: 25 U/L (ref 0–44)
AST: 31 U/L (ref 15–41)
Albumin: 3.8 g/dL (ref 3.5–5.0)
Alkaline Phosphatase: 81 U/L (ref 38–126)
Anion gap: 11 (ref 5–15)
BUN: 29 mg/dL — ABNORMAL HIGH (ref 8–23)
CO2: 24 mmol/L (ref 22–32)
Calcium: 8.5 mg/dL — ABNORMAL LOW (ref 8.9–10.3)
Chloride: 102 mmol/L (ref 98–111)
Creatinine, Ser: 1.68 mg/dL — ABNORMAL HIGH (ref 0.61–1.24)
GFR, Estimated: 41 mL/min — ABNORMAL LOW (ref >60)
Glucose, Bld: 188 mg/dL — ABNORMAL HIGH (ref 70–99)
Potassium: 4.2 mmol/L (ref 3.5–5.1)
Sodium: 137 mmol/L (ref 135–145)
Total Bilirubin: 0.7 mg/dL (ref 0.3–1.2)
Total Protein: 7.2 g/dL (ref 6.5–8.1)

## 2022-09-07 LAB — SURGICAL PATHOLOGY

## 2022-09-07 LAB — PHOSPHORUS: Phosphorus: 3.2 mg/dL (ref 2.5–4.6)

## 2022-09-07 LAB — MAGNESIUM: Magnesium: 1.7 mg/dL (ref 1.7–2.4)

## 2022-09-07 MED ORDER — SODIUM CHLORIDE 0.9% FLUSH
10.0000 mL | INTRAVENOUS | Status: DC | PRN
  Administered 2022-09-07: 10 mL

## 2022-09-07 MED ORDER — SODIUM CHLORIDE 0.9 % IV SOLN
200.0000 mg | Freq: Once | INTRAVENOUS | Status: AC
  Administered 2022-09-07: 200 mg via INTRAVENOUS
  Filled 2022-09-07: qty 8

## 2022-09-07 MED ORDER — SODIUM CHLORIDE 0.9 % IV SOLN: Freq: Once | INTRAVENOUS | Status: AC

## 2022-09-07 MED ORDER — HEPARIN SOD (PORK) LOCK FLUSH 100 UNIT/ML IV SOLN
500.0000 [IU] | Freq: Once | INTRAVENOUS | Status: AC | PRN
  Administered 2022-09-07: 500 [IU]

## 2022-09-07 MED ORDER — SODIUM CHLORIDE 0.9 % IV SOLN
1.2700 mg/kg | Freq: Once | INTRAVENOUS | Status: AC
  Administered 2022-09-07: 100 mg via INTRAVENOUS
  Filled 2022-09-07: qty 10

## 2022-09-07 MED ORDER — PROCHLORPERAZINE MALEATE 10 MG PO TABS
10.0000 mg | ORAL_TABLET | Freq: Once | ORAL | Status: AC
  Administered 2022-09-07: 10 mg via ORAL
  Filled 2022-09-07: qty 1

## 2022-09-07 NOTE — Progress Notes (Signed)
[facility] [place], [place] [address]   CLINIC:  Medical Oncology/Hematology  PCP:  Earney Mallet, MD [address] 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, a 81 y.o. 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  Cycle 1 of enfortumab and Keytruda was on 08/17/2022.  He had a TURBT done yesterday.  After day 8 of enfortumab, he felt occasionally tired.  Nausea is better.  He developed rash on the upper body and used Aquaphor and it improved.  Today he does not have any rash.  REVIEW OF SYSTEMS:  Review of Systems  Gastrointestinal:  Positive for nausea.  Genitourinary:  Positive for hematuria.   Musculoskeletal:  Positive for back pain.  Neurological:  Positive for dizziness.  Psychiatric/Behavioral:  The patient is nervous/anxious.   All other systems reviewed and are negative.   PAST MEDICAL/SURGICAL HISTORY:  Past Medical History:  Diagnosis Date   Anemia    Anxiety    Arthritis    Bladder tumor    Cancer ([place])    hx of skin cancer, renal cancer   Diabetes mellitus without complication (HCC)    GERD (gastroesophageal reflux disease)    Heart murmur    as a child   History of kidney stones    Port-A-Cath in place 08/17/2022   Sleep apnea    cpap   Past Surgical History:  Procedure Laterality Date   bilateral dupreyen contracture surgery      CHOLECYSTECTOMY     CYSTOSCOPY N/A 05/30/2022   Procedure: CYSTOSCOPY WITH TRANSURETHRAL RESECTION OF THE LEFT URETERAL ORIFICE,  TRANSURETHRAL RESECTION OF BLADDER TUMOR;  Surgeon: Ceasar Mons,  MD;  Location: [facility];  Service: Urology;  Laterality: N/A;   CYSTOSCOPY WITH RETROGRADE PYELOGRAM, URETEROSCOPY AND STENT PLACEMENT Left 04/26/2022   Procedure: CYSTOSCOPY WITH RETROGRADE PYELOGRAM, URETEROSCOPY AND STENT PLACEMENT;  Surgeon: Cleon Gustin, MD;  Location: [facility];  Service: Urology;  Laterality: Left;   IR IMAGING GUIDED PORT INSERTION  08/16/2022   LIVER BIOPSY     ROBOT ASSITED LAPAROSCOPIC NEPHROURETERECTOMY Left 05/30/2022   Procedure: XI ROBOT ASSITED LAPAROSCOPIC NEPHROURETERECTOMY;  Surgeon: Ceasar Mons, MD;  Location: [facility];  Service: Urology;  Laterality: Left;   TRANSURETHRAL RESECTION OF BLADDER TUMOR  04/26/2022   Procedure: TRANSURETHRAL RESECTION OF BLADDER TUMOR (TURBT);  Surgeon: Cleon Gustin, MD;  Location: [facility];  Service: Urology;;    SOCIAL HISTORY:  Social History   Socioeconomic History   Marital status: Married    Spouse name: Not on file   Number of children: Not on file   Years of education: Not on file   Highest education level: Not on file  Occupational History   Not on file  Tobacco Use   Smoking status: Former    Types: Cigarettes   Smokeless tobacco: Never  Vaping Use   Vaping Use: Never used  Substance and Sexual Activity   Alcohol use: Never   Drug use: Never   Sexual activity: Not Currently  Other Topics Concern   Not on file  Social History Narrative   Not on file   Social Determinants of  Health   Financial Resource Strain: Not on file  Food Insecurity: Not on file  Transportation Needs: Not on file  Physical Activity: Not on file  Stress: Not on file  Social Connections: Not on file  Intimate Partner Violence: Not on file    FAMILY HISTORY:  No family history on file.  CURRENT MEDICATIONS:  Current Outpatient Medications  Medication Sig Dispense Refill   acetaminophen (TYLENOL) 500 MG tablet Take 1,000 mg by mouth every 6 (six) hours as needed for moderate pain.     citalopram  (CELEXA) 20 MG tablet Take 20 mg by mouth daily.     docusate sodium (COLACE) 100 MG capsule Take 1 capsule (100 mg total) by mouth 2 (two) times daily as needed for mild constipation. 30 capsule 0   Enfortumab Vedotin-ejfv (PADCEV IV) Inject into the vein once a week. Days 1 and 8 every 21 days     fluorouracil (EFUDEX) 5 % cream Apply 1 Application topically 2 (two) times daily as needed (cancer spots).     latanoprost (XALATAN) 0.005 % ophthalmic solution Place 1 drop into both eyes at bedtime.     lidocaine-prilocaine (EMLA) cream Apply a small amount to port a cath site and cover with plastic wrap 1 hour prior to infusion 30 g 3   omeprazole (PRILOSEC) 20 MG capsule Take 20 mg by mouth 2 (two) times daily.     Oxycodone HCl 10 MG TABS Take 1 tablet (10 mg total) by mouth every 8 (eight) hours as needed. 30 tablet 0   PEMBROLIZUMAB IV Inject into the vein every 21 ( twenty-one) days.     prochlorperazine (COMPAZINE) 10 MG tablet Take 1 tablet (10 mg total) by mouth every 6 (six) hours as needed for nausea or vomiting. 60 tablet 3   simvastatin (ZOCOR) 40 MG tablet Take 40 mg by mouth daily.     tamsulosin (FLOMAX) 0.4 MG CAPS capsule Take 0.4 mg by mouth at bedtime.     timolol (BETIMOL) 0.5 % ophthalmic solution Place 1 drop into both eyes daily.     tizanidine (ZANAFLEX) 2 MG capsule Take 2 mg by mouth at bedtime.     No current facility-administered medications for this visit.    ALLERGIES:  No Known Allergies  PHYSICAL EXAM:  Performance status (ECOG): 1 - Symptomatic but completely ambulatory  There were no vitals filed for this visit.  Wt Readings from Last 3 Encounters:  09/06/22 176 lb 5.9 oz (80 kg)  08/24/22 176 lb 6.4 oz (80 kg)  08/17/22 172 lb 3.2 oz (78.1 kg)   Physical Exam Vitals reviewed.  Constitutional:      Appearance: Normal appearance.  Cardiovascular:     Rate and Rhythm: Normal rate and regular rhythm.     Pulses: Normal pulses.     Heart sounds:  Normal heart sounds.  Pulmonary:     Effort: Pulmonary effort is normal.     Breath sounds: Normal breath sounds.  Neurological:     General: No focal deficit present.     Mental Status: He is alert and oriented to person, place, and time.  Psychiatric:        Mood and Affect: Mood normal.        Behavior: Behavior normal.     LABORATORY DATA:  I have reviewed the labs as listed.     Latest Ref Rng & Units 08/24/2022    8:06 AM 08/17/2022    7:52 AM 07/24/2022  2:25 PM  CBC  WBC 4.0 - 10.5 K/uL 9.5  10.6  9.3   Hemoglobin 13.0 - 17.0 g/dL 8.7  8.7  9.4   Hematocrit 39.0 - 52.0 % 27.9  28.1  30.4   Platelets 150 - 400 K/uL 319  344  340       Latest Ref Rng & Units 08/24/2022    8:06 AM 08/17/2022    7:52 AM 07/24/2022    2:25 PM  CMP  Glucose 70 - 99 mg/dL 134  136  122   BUN 8 - 23 mg/dL 37  33  41   Creatinine 0.61 - 1.24 mg/dL 1.57  1.60  1.84   Sodium 135 - 145 mmol/L 137  138  139   Potassium 3.5 - 5.1 mmol/L 4.6  4.6  4.8   Chloride 98 - 111 mmol/L 107  107  107   CO2 22 - 32 mmol/L _0 Calcium 8.9 - 10.3 mg/dL 8.5  8.8  8.8   Total Protein 6.5 - 8.1 g/dL 6.8  6.7  7.2   Total Bilirubin 0.3 - 1.2 mg/dL 0.3  0.5  0.3   Alkaline Phos 38 - 126 U/L 74  72  66   AST 15 - 41 U/L _1 ALT 0 - 44 U/L _2 Component Value Date/Time   RBC 4.24 08/24/2022 0806   MCV 65.8 (L) 08/24/2022 0806   MCH 20.5 (L) 08/24/2022 0806   MCHC 31.2 08/24/2022 0806   RDW 18.4 (H) 08/24/2022 0806   LYMPHSABS 1.1 08/24/2022 0806   MONOABS 0.6 08/24/2022 0806   EOSABS 0.5 08/24/2022 0806   BASOSABS 0.1 08/24/2022 0806    DIAGNOSTIC IMAGING:  I have independently reviewed the scans and discussed with the patient. IR IMAGING GUIDED PORT INSERTION  Result Date: 08/16/2022 CLINICAL DATA:  Invasive bladder cancer, renal cell carcinoma post left nephrectomy, needs durable venous access for treatment regimen EXAM: TUNNELED PORT CATHETER PLACEMENT WITH  ULTRASOUND AND FLUOROSCOPIC GUIDANCE FLUOROSCOPY: Radiation Exposure Index (as provided by the fluoroscopic device): 4 mGy air Kerma ANESTHESIA/SEDATION: Intravenous Fentanyl 186mg and Versed 249mwere administered as conscious sedation during continuous monitoring of the patient's level of consciousness and physiological / cardiorespiratory status by the radiology RN, with a total moderate sedation time of 17 minutes. TECHNIQUE: The procedure, risks, benefits, and alternatives were explained to the patient. Questions regarding the procedure were encouraged and answered. The patient understands and consents to the procedure. Patency of the right IJ vein was confirmed with ultrasound with image documentation. An appropriate skin site was determined. Skin site was marked. Region was prepped using maximum barrier technique including cap and mask, sterile gown, sterile gloves, large sterile sheet, and Chlorhexidine as cutaneous antisepsis. The region was infiltrated locally with 1% lidocaine. Under real-time ultrasound guidance, the right IJ vein was accessed with a 21 gauge micropuncture needle; the needle tip within the vein was confirmed with ultrasound image documentation. Needle was exchanged over a 018 guidewire for transitional dilator, and vascular measurement was performed. A small incision was made on the right anterior chest wall and a subcutaneous pocket fashioned. The power-injectable port was positioned and its catheter tunneled to the right IJ dermatotomy site. The transitional dilator was exchanged over an Amplatz wire for a peel-away sheath, through which the port catheter, which had been trimmed to the appropriate length, was advanced and  positioned under fluoroscopy with its tip at the cavoatrial junction. Spot chest radiograph confirms good catheter position and no pneumothorax. The port was flushed per protocol. The pocket was closed with deep interrupted and subcuticular continuous 3-0 Monocryl  sutures. The incisions were covered with Dermabond then covered with a sterile dressing. The patient tolerated the procedure well. COMPLICATIONS: COMPLICATIONS None immediate IMPRESSION: Technically successful right IJ power-injectable port catheter placement. Ready for routine use. Electronically Signed   By: Lucrezia Europe M.D.   On: 08/16/2022 12:30     ASSESSMENT:  Neutrophilic leukocytosis and microcytic anemia: - Patient seen at the request of Dr. Macarthur Critchley for evaluation of leukocytosis and anemia. - CBCD (10/31/2021): WBC-15.4 (N 68%, L 16%, M 5%, E 8%, B 1%) Hb-10.3, MCV-61, PLT-452 - BCR/ABL PCR: Negative on 10/31/2021 - CBCD (09/14/2021): WBC-14.6 (elevated ANC and AEC), Hb-10.5, MCV 62, PLT-384 - CBCD (08/29/2021): WBC-13, Hb-11, MCV-65, PLT-395 (elevated ANC and AEC-0.5 - He reports that he has been anemic all his life.  No prior history of blood transfusion.  No fevers or night sweats. - 22 pound weight loss since at least summer 2022.  Reports decreased appetite.  Reports bloating after eating. - In January 2023, he was hospitalized in [place] for bowel obstruction and bladder infection.  Nonsurgical management.  He received IV iron x1. - He had colonoscopy x3, last one in the 5 years negative.    Social/family history: - He lives at home with his wife.  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  Quit smoking 38 years ago. - No family history of leukemia.  Brother had lung cancer.  Maternal grandmother had cancer.  3.  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 - Left nephroureterectomy and lymph node biopsy on 05/30/2022 by Dr. Gilford Rile. - Pathology shows high-grade papillary urothelial carcinoma of the renal pelvis, 7.5 cm, tumor invades through  muscularis into the renal parenchyma and peripelvic fat (PT3).  Urothelial carcinoma is identified at the proximal and mid left ureter vascular, ureteral and all margins of resection are negative for tumor.  Metastatic urothelial carcinoma in 1/2 lymph nodes.  Left para-aortic resection shows 0/7 lymph nodes involved. - PET scan (08/03/2022): Hypermetabolic left supraclavicular lymph node 13 mm, SUV 3.8.  13 mm prevascular lymph node, SUV three 5.5.  High right paratracheal lymph node 12 mm, SUV 5.0.  I left paratracheal lymph node 10 mm, SUV 4.1.  Right middle lobe nodule 12 mm.  Right lower lobe nodule 7 mm.  7 mm left lung base nodule.  Right middle lobe largest nodule with SUV 2.9.  New hypermetabolic nodal tissue left aorta SUV 5.1. - Cycle 1 of enfortumab and Keytruda on 08/17/2022 - [facility] NGS: MSI-stable, TMB-low, BRAF pathogenic variant exon 15 (non V600), TSC1 pathogenic variant exon 17.  HER2 by IHC 0.  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 - He had occasional tiredness.  Nausea is reportedly better. - He reports that he has been feeling better since the start of therapy. - Reviewed labs today which showed creatinine stable at 1.68 and LFTs are normal.  CBC shows white count is 14.2 and  hemoglobin stable at 8.7. - Proceed with cycle 2-day 1 of enfortumab and Keytruda today.  RTC 3 weeks for follow-up.   2.  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  3.  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 - Last ferritin was 148.  He received Feraheme x 1 on 08/24/2022.  4.  Chronic back pain: - Continue oxycodone 10 mg every 8 hours which is helping.  5.  Low-grade urothelial carcinoma of the bladder: - He had  a TURBT on 09/06/2022. - Pathology consistent with noninvasive high-grade papillary urothelial carcinoma.  Muscularis propria is present and not involved by carcinoma.  Orders placed this encounter:  No orders of the defined types were placed in this encounter.     Derek Jack, MD [place] [phone]

## 2022-09-07 NOTE — Patient Instructions (Addendum)
[place] at [facility] Discharge Instructions   You were seen and examined today by Dr. Delton Coombes.  He reviewed partial results of your lab work which are normal/stable. We are waiting on the results of your blood counts.   Dr. Raliegh Ip would like for you to start back on the metformin twice a day as the Padcev you are receiving can cause your blood sugars to go up.   We will proceed with your treatment today.  Continue oxycodone three times a day as needed for pain.   Return as scheduled.    Thank you for choosing [place] at [facility] to provide your oncology and hematology care.  To afford each patient quality time with our provider, please arrive at least 15 minutes before your scheduled appointment time.   If you have a lab appointment with the [facility] please come in thru the Main Entrance and check in at the main information desk.  You need to re-schedule your appointment should you arrive 10 or more minutes late.  We strive to give you quality time with our providers, and arriving late affects you and other patients whose appointments are after yours.  Also, if you no show three or more times for appointments you may be dismissed from the clinic at the providers discretion.     Again, thank you for choosing [facility].  Our hope is that these requests will decrease the amount of time that you wait before being seen by our physicians.       _____________________________________________________________  Should you have questions after your visit to [facility], please contact our office at [phone] and follow the prompts.  Our office hours are 8:00 a.m. and 4:30 p.m. Monday - Friday.  Please note that voicemails left after 4:00 p.m. may not be returned until the following business day.  We are closed weekends and major holidays.  You do have access to a nurse 24-7, just call the main number to the  clinic [phone] and do not press any options, hold on the line and a nurse will answer the phone.    For prescription refill requests, have your pharmacy contact our office and allow 72 hours.    Due to Covid, you will need to wear a mask upon entering the hospital. If you do not have a mask, a mask will be given to you at the Main Entrance upon arrival. For doctor visits, patients may have 1 support person age 75 or older with them. For treatment visits, patients can not have anyone with them due to social distancing guidelines and our immunocompromised population.

## 2022-09-07 NOTE — Patient Instructions (Signed)
[facility]  Discharge Instructions: Thank you for choosing [facility] to provide your oncology and hematology care.  If you have a lab appointment with the [facility], please come in thru the Main Entrance and check in at the main information desk.  Wear comfortable clothing and clothing appropriate for easy access to any Portacath or PICC line.   We strive to give you quality time with your provider. You may need to reschedule your appointment if you arrive late (15 or more minutes).  Arriving late affects you and other patients whose appointments are after yours.  Also, if you miss three or more appointments without notifying the office, you may be dismissed from the clinic at the provider's discretion.      For prescription refill requests, have your pharmacy contact our office and allow 72 hours for refills to be completed.    Today you received the following chemotherapy and/or immunotherapy agents Padcev and Keytruda, return as scheduled.   To help prevent nausea and vomiting after your treatment, we encourage you to take your nausea medication as directed.  BELOW ARE SYMPTOMS THAT SHOULD BE REPORTED IMMEDIATELY: *FEVER GREATER THAN 100.4 F (38 C) OR HIGHER *CHILLS OR SWEATING *NAUSEA AND VOMITING THAT IS NOT CONTROLLED WITH YOUR NAUSEA MEDICATION *UNUSUAL SHORTNESS OF BREATH *UNUSUAL BRUISING OR BLEEDING *URINARY PROBLEMS (pain or burning when urinating, or frequent urination) *BOWEL PROBLEMS (unusual diarrhea, constipation, pain near the anus) TENDERNESS IN MOUTH AND THROAT WITH OR WITHOUT PRESENCE OF ULCERS (sore throat, sores in mouth, or a toothache) UNUSUAL RASH, SWELLING OR PAIN  UNUSUAL VAGINAL DISCHARGE OR ITCHING   Items with * indicate a potential emergency and should be followed up as soon as possible or go to the Emergency Department if any problems should occur.  Please show the CHEMOTHERAPY ALERT CARD or IMMUNOTHERAPY ALERT  CARD at check-in to the Emergency Department and triage nurse.  Should you have questions after your visit or need to cancel or reschedule your appointment, please contact [facility] [phone]  and follow the prompts.  Office hours are 8:00 a.m. to 4:30 p.m. Monday - Friday. Please note that voicemails left after 4:00 p.m. may not be returned until the following business day.  We are closed weekends and major holidays. You have access to a nurse at all times for urgent questions. Please call the main number to the clinic [phone] and follow the prompts.  For any non-urgent questions, you may also contact your provider using MyChart. We now offer e-Visits for anyone 58 and older to request care online for non-urgent symptoms. For details visit [email or web address].   Also download the MyChart app! Go to the app store, search "MyChart", open the app, select [facility], and log in with your MyChart username and password.  Masks are optional in the cancer centers. If you would like for your care team to wear a mask while they are taking care of you, please let them know. You may have one support person who is at least 80 years old accompany you for your appointments.

## 2022-09-07 NOTE — Progress Notes (Signed)
Patient presents today for Radcev and Keytruda. Patient's Serum Creatinine 1.68. 524m of Normal Saline added to each plan. Patient okay for treatment, per Dr. KDelton Coombes Patient tolerated chemotherapy with no complaints voiced. Side effects with management reviewed understanding verbalized. Port site clean and dry with no bruising or swelling noted at site. Good blood return noted before and after administration of chemotherapy. Band aid applied. Patient left in satisfactory condition with VSS and no s/s of distress noted.

## 2022-09-07 NOTE — Progress Notes (Signed)
Ok to proceed with today's labs, add NS 500 ml over 1 hour with each treatment for elevated creatinine.  Patient has only 1 kidney and Dr Delton Coombes wants additional fluids with each treatment.  T.O. Dr Rhys Martini, PharmD

## 2022-09-08 ENCOUNTER — Encounter (HOSPITAL_COMMUNITY): Payer: Self-pay | Admitting: Urology

## 2022-09-13 ENCOUNTER — Ambulatory Visit (INDEPENDENT_AMBULATORY_CARE_PROVIDER_SITE_OTHER): Payer: MEDICARE | Admitting: Urology

## 2022-09-13 ENCOUNTER — Encounter: Payer: Self-pay | Admitting: Urology

## 2022-09-13 VITALS — BP 112/61 | HR 91

## 2022-09-13 DIAGNOSIS — N1330 Unspecified hydronephrosis: Secondary | ICD-10-CM

## 2022-09-13 DIAGNOSIS — C678 Malignant neoplasm of overlapping sites of bladder: Secondary | ICD-10-CM | POA: Diagnosis not present

## 2022-09-13 NOTE — Progress Notes (Signed)
09/13/2022 9:57 AM   [place] 1941/01/22 [id number]  Referring provider: Earney Mallet, MD [address] [place] [address]  Followup bladder cancer   HPI: Joshua Osborne is a 81yo here for followup after bladder cancer. 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 He has a hx of of metastatic TCC and currently under treatment with Dr. Delton Coombes.    PMH: Past Medical History:  Diagnosis Date   Anemia    Anxiety    Arthritis    Bladder tumor    Cancer ([place])    hx of skin cancer, renal cancer   Diabetes mellitus without complication (HCC)    GERD (gastroesophageal reflux disease)    Heart murmur    as a child   History of kidney stones    Port-A-Cath in place 08/17/2022   Sleep apnea    cpap    Surgical History: Past Surgical History:  Procedure Laterality Date   bilateral dupreyen contracture surgery      CHOLECYSTECTOMY     CYSTOSCOPY N/A 05/30/2022   Procedure: CYSTOSCOPY WITH TRANSURETHRAL RESECTION OF THE LEFT URETERAL ORIFICE,  TRANSURETHRAL RESECTION OF BLADDER TUMOR;  Surgeon: Ceasar Mons, MD;  Location: [facility];  Service: Urology;  Laterality: N/A;   CYSTOSCOPY N/A 09/06/2022   Procedure: CYSTOSCOPY;  Surgeon: Cleon Gustin, MD;  Location: [facility];  Service: Urology;  Laterality: N/A;   CYSTOSCOPY WITH RETROGRADE PYELOGRAM, URETEROSCOPY AND STENT PLACEMENT Left 04/26/2022   Procedure: CYSTOSCOPY WITH RETROGRADE PYELOGRAM, URETEROSCOPY AND STENT PLACEMENT;  Surgeon: Cleon Gustin, MD;  Location: [facility];  Service: Urology;  Laterality: Left;   IR IMAGING GUIDED PORT INSERTION  08/16/2022   LIVER BIOPSY     ROBOT ASSITED LAPAROSCOPIC NEPHROURETERECTOMY Left 05/30/2022   Procedure: XI ROBOT ASSITED LAPAROSCOPIC NEPHROURETERECTOMY;  Surgeon: Ceasar Mons, MD;  Location: [facility];  Service: Urology;  Laterality: Left;   TRANSURETHRAL RESECTION OF BLADDER TUMOR  04/26/2022   Procedure:  TRANSURETHRAL RESECTION OF BLADDER TUMOR (TURBT);  Surgeon: Cleon Gustin, MD;  Location: [facility];  Service: Urology;;   TRANSURETHRAL RESECTION OF BLADDER TUMOR N/A 09/06/2022   Procedure: TRANSURETHRAL RESECTION OF BLADDER TUMOR (TURBT);  Surgeon: Cleon Gustin, MD;  Location: [facility];  Service: Urology;  Laterality: N/A;    Home Medications:  Allergies as of 09/13/2022   No Known Allergies      Medication List        Accurate as of September 13, 2022  9:57 AM. If you have any questions, ask your nurse or doctor.          acetaminophen 500 MG tablet Commonly known as: TYLENOL Take 1,000 mg by mouth every 6 (six) hours as needed for moderate pain.   citalopram 20 MG tablet Commonly known as: CELEXA Take 20 mg by mouth daily.   docusate sodium 100 MG capsule Commonly known as: Colace Take 1 capsule (100 mg total) by mouth 2 (two) times daily as needed for mild constipation.   fluorouracil 5 % cream Commonly known as: EFUDEX Apply 1 Application topically 2 (two) times daily as needed (cancer spots).   latanoprost 0.005 % ophthalmic solution Commonly known as: XALATAN Place 1 drop into both eyes at bedtime.   lidocaine-prilocaine cream Commonly known as: EMLA Apply a small amount to port a cath site and cover with plastic wrap 1 hour prior to infusion   omeprazole 20 MG capsule Commonly known as: PRILOSEC Take 20 mg by  mouth 2 (two) times daily.   Oxycodone HCl 10 MG Tabs Take 1 tablet (10 mg total) by mouth every 8 (eight) hours as needed.   PADCEV IV Inject into the vein once a week. Days 1 and 8 every 21 days   PEMBROLIZUMAB IV Inject into the vein every 21 ( twenty-one) days.   prochlorperazine 10 MG tablet Commonly known as: COMPAZINE Take 1 tablet (10 mg total) by mouth every 6 (six) hours as needed for nausea or vomiting.   simvastatin 40 MG tablet Commonly known as: ZOCOR Take 40 mg by mouth daily.   tamsulosin 0.4 MG Caps  capsule Commonly known as: FLOMAX Take 0.4 mg by mouth at bedtime.   timolol 0.5 % ophthalmic solution Commonly known as: BETIMOL Place 1 drop into both eyes daily.   tizanidine 2 MG capsule Commonly known as: ZANAFLEX Take 2 mg by mouth at bedtime.        Allergies: No Known Allergies  Family History: No family history on file.  Social History:  reports that he has quit smoking. His smoking use included cigarettes. He has never used smokeless tobacco. He reports that he does not drink alcohol and does not use drugs.  ROS: All other review of systems were reviewed and are negative except what is noted above in HPI  Physical Exam: BP 112/61   Pulse 91   Constitutional:  Alert and oriented, No acute distress. HEENT: [place] AT, moist mucus membranes.  Trachea midline, no masses. Cardiovascular: No clubbing, cyanosis, or edema. Respiratory: Normal respiratory effort, no increased work of breathing. GI: Abdomen is soft, nontender, nondistended, no abdominal masses GU: No CVA tenderness.  Lymph: No cervical or inguinal lymphadenopathy. Skin: No rashes, bruises or suspicious lesions. Neurologic: Grossly intact, no focal deficits, moving all 4 extremities. Psychiatric: Normal mood and affect.  Laboratory Data: Lab Results  Component Value Date   WBC 14.2 (H) 09/07/2022   HGB 8.7 (L) 09/07/2022   HCT 28.7 (L) 09/07/2022   MCV 65.8 (L) 09/07/2022   PLT 399 09/07/2022    Lab Results  Component Value Date   CREATININE 1.68 (H) 09/07/2022    No results found for: "PSA"  No results found for: "TESTOSTERONE"  Lab Results  Component Value Date   HGBA1C 5.3 08/24/2022    Urinalysis    Component Value Date/Time   COLORURINE YELLOW 07/24/2022 1425   APPEARANCEUR Cloudy (A) 08/23/2022 1337   LABSPEC 1.019 07/24/2022 1425   PHURINE 5.0 07/24/2022 1425   GLUCOSEU Negative 08/23/2022 1337   HGBUR LARGE (A) 07/24/2022 1425   BILIRUBINUR Negative 08/23/2022 1337    KETONESUR NEGATIVE 07/24/2022 1425   PROTEINUR 2+ (A) 08/23/2022 1337   PROTEINUR 100 (A) 07/24/2022 1425   NITRITE Negative 08/23/2022 1337   NITRITE NEGATIVE 07/24/2022 1425   LEUKOCYTESUR 1+ (A) 08/23/2022 1337   LEUKOCYTESUR MODERATE (A) 07/24/2022 1425    Lab Results  Component Value Date   LABMICR See below: 08/23/2022   WBCUA >30 (A) 08/23/2022   LABEPIT 0-10 08/23/2022   MUCUS Present 05/03/2022   BACTERIA Few 08/23/2022    Pertinent Imaging:  No results found for this or any previous visit.  No results found for this or any previous visit.  No results found for this or any previous visit.  No results found for this or any previous visit.  No results found for this or any previous visit.  No valid procedures specified. No results found for this or any previous visit.  No results found for this or any previous visit.   Assessment & Plan:    1. Malignant neoplasm of overlapping sites of bladder [facility]) Voiding trial passed today. Followup 3 months for cystoscopy   No follow-ups on file.  [name], MD  [facility] [place]

## 2022-09-13 NOTE — Progress Notes (Signed)
Fill and Pull Catheter Removal  Patient is present today for a catheter removal.  Patient was cleaned and prepped in a sterile fashion 117m of sterile water/ saline was instilled into the bladder when the patient felt the urge to urinate. 385mof water was then drained from the balloon.  A 22FR foley cath was removed from the bladder no complications were noted .  Patient as then given some time to void on their own.  Patient can void  11040mn their own after some time.  Patient tolerated well.  Performed by: Ebelyn Bohnet LPN  Follow up/ Additional notes: per MD

## 2022-09-13 NOTE — Patient Instructions (Signed)

## 2022-09-14 ENCOUNTER — Inpatient Hospital Stay: Payer: MEDICARE | Admitting: Hematology

## 2022-09-14 ENCOUNTER — Inpatient Hospital Stay: Payer: MEDICARE

## 2022-09-14 ENCOUNTER — Inpatient Hospital Stay: Payer: MEDICARE | Attending: Oncology

## 2022-09-14 VITALS — BP 100/53 | HR 80 | Temp 99.1°F | Resp 18

## 2022-09-14 DIAGNOSIS — M25519 Pain in unspecified shoulder: Secondary | ICD-10-CM | POA: Diagnosis not present

## 2022-09-14 DIAGNOSIS — Z87442 Personal history of urinary calculi: Secondary | ICD-10-CM | POA: Insufficient documentation

## 2022-09-14 DIAGNOSIS — C652 Malignant neoplasm of left renal pelvis: Secondary | ICD-10-CM | POA: Insufficient documentation

## 2022-09-14 DIAGNOSIS — G479 Sleep disorder, unspecified: Secondary | ICD-10-CM | POA: Diagnosis not present

## 2022-09-14 DIAGNOSIS — K5730 Diverticulosis of large intestine without perforation or abscess without bleeding: Secondary | ICD-10-CM | POA: Diagnosis not present

## 2022-09-14 DIAGNOSIS — Z5112 Encounter for antineoplastic immunotherapy: Secondary | ICD-10-CM | POA: Insufficient documentation

## 2022-09-14 DIAGNOSIS — N390 Urinary tract infection, site not specified: Secondary | ICD-10-CM | POA: Insufficient documentation

## 2022-09-14 DIAGNOSIS — D509 Iron deficiency anemia, unspecified: Secondary | ICD-10-CM | POA: Diagnosis not present

## 2022-09-14 DIAGNOSIS — M5116 Intervertebral disc disorders with radiculopathy, lumbar region: Secondary | ICD-10-CM | POA: Diagnosis not present

## 2022-09-14 DIAGNOSIS — Z87891 Personal history of nicotine dependence: Secondary | ICD-10-CM | POA: Insufficient documentation

## 2022-09-14 DIAGNOSIS — Z905 Acquired absence of kidney: Secondary | ICD-10-CM | POA: Insufficient documentation

## 2022-09-14 DIAGNOSIS — I700 Atherosclerosis of aorta: Secondary | ICD-10-CM | POA: Diagnosis not present

## 2022-09-14 DIAGNOSIS — R110 Nausea: Secondary | ICD-10-CM | POA: Insufficient documentation

## 2022-09-14 DIAGNOSIS — R42 Dizziness and giddiness: Secondary | ICD-10-CM | POA: Insufficient documentation

## 2022-09-14 DIAGNOSIS — C679 Malignant neoplasm of bladder, unspecified: Secondary | ICD-10-CM | POA: Diagnosis not present

## 2022-09-14 DIAGNOSIS — N400 Enlarged prostate without lower urinary tract symptoms: Secondary | ICD-10-CM | POA: Insufficient documentation

## 2022-09-14 DIAGNOSIS — G8929 Other chronic pain: Secondary | ICD-10-CM | POA: Insufficient documentation

## 2022-09-14 DIAGNOSIS — M4316 Spondylolisthesis, lumbar region: Secondary | ICD-10-CM | POA: Diagnosis not present

## 2022-09-14 DIAGNOSIS — M549 Dorsalgia, unspecified: Secondary | ICD-10-CM | POA: Insufficient documentation

## 2022-09-14 DIAGNOSIS — D649 Anemia, unspecified: Secondary | ICD-10-CM | POA: Insufficient documentation

## 2022-09-14 DIAGNOSIS — R0602 Shortness of breath: Secondary | ICD-10-CM | POA: Diagnosis not present

## 2022-09-14 DIAGNOSIS — Z79899 Other long term (current) drug therapy: Secondary | ICD-10-CM | POA: Insufficient documentation

## 2022-09-14 DIAGNOSIS — D72828 Other elevated white blood cell count: Secondary | ICD-10-CM | POA: Insufficient documentation

## 2022-09-14 DIAGNOSIS — R200 Anesthesia of skin: Secondary | ICD-10-CM | POA: Insufficient documentation

## 2022-09-14 DIAGNOSIS — M5137 Other intervertebral disc degeneration, lumbosacral region: Secondary | ICD-10-CM | POA: Insufficient documentation

## 2022-09-14 DIAGNOSIS — Z95828 Presence of other vascular implants and grafts: Secondary | ICD-10-CM

## 2022-09-14 DIAGNOSIS — M4726 Other spondylosis with radiculopathy, lumbar region: Secondary | ICD-10-CM | POA: Insufficient documentation

## 2022-09-14 DIAGNOSIS — R21 Rash and other nonspecific skin eruption: Secondary | ICD-10-CM | POA: Diagnosis not present

## 2022-09-14 DIAGNOSIS — Z85828 Personal history of other malignant neoplasm of skin: Secondary | ICD-10-CM | POA: Insufficient documentation

## 2022-09-14 DIAGNOSIS — Z9049 Acquired absence of other specified parts of digestive tract: Secondary | ICD-10-CM | POA: Insufficient documentation

## 2022-09-14 DIAGNOSIS — K5900 Constipation, unspecified: Secondary | ICD-10-CM | POA: Diagnosis not present

## 2022-09-14 DIAGNOSIS — Z809 Family history of malignant neoplasm, unspecified: Secondary | ICD-10-CM | POA: Insufficient documentation

## 2022-09-14 DIAGNOSIS — Z801 Family history of malignant neoplasm of trachea, bronchus and lung: Secondary | ICD-10-CM | POA: Insufficient documentation

## 2022-09-14 LAB — CBC WITH DIFFERENTIAL/PLATELET
Abs Immature Granulocytes: 0.06 10*3/uL (ref 0.00–0.07)
Basophils Absolute: 0.1 10*3/uL (ref 0.0–0.1)
Basophils Relative: 1 %
Eosinophils Absolute: 0.4 10*3/uL (ref 0.0–0.5)
Eosinophils Relative: 4 %
HCT: 28.6 % — ABNORMAL LOW (ref 39.0–52.0)
Hemoglobin: 8.9 g/dL — ABNORMAL LOW (ref 13.0–17.0)
Immature Granulocytes: 1 %
Lymphocytes Relative: 10 %
Lymphs Abs: 1.1 10*3/uL (ref 0.7–4.0)
MCH: 20.3 pg — ABNORMAL LOW (ref 26.0–34.0)
MCHC: 31.1 g/dL (ref 30.0–36.0)
MCV: 65.1 fL — ABNORMAL LOW (ref 80.0–100.0)
Monocytes Absolute: 1.2 10*3/uL — ABNORMAL HIGH (ref 0.1–1.0)
Monocytes Relative: 11 %
Neutro Abs: 8.3 10*3/uL — ABNORMAL HIGH (ref 1.7–7.7)
Neutrophils Relative %: 73 %
Platelets: 390 10*3/uL (ref 150–400)
RBC: 4.39 MIL/uL (ref 4.22–5.81)
RDW: 17.8 % — ABNORMAL HIGH (ref 11.5–15.5)
WBC: 11.1 10*3/uL — ABNORMAL HIGH (ref 4.0–10.5)
nRBC: 0 % (ref 0.0–0.2)

## 2022-09-14 LAB — COMPREHENSIVE METABOLIC PANEL
ALT: 12 U/L (ref 0–44)
AST: 17 U/L (ref 15–41)
Albumin: 3.6 g/dL (ref 3.5–5.0)
Alkaline Phosphatase: 70 U/L (ref 38–126)
Anion gap: 8 (ref 5–15)
BUN: 22 mg/dL (ref 8–23)
CO2: 28 mmol/L (ref 22–32)
Calcium: 8.8 mg/dL — ABNORMAL LOW (ref 8.9–10.3)
Chloride: 101 mmol/L (ref 98–111)
Creatinine, Ser: 1.45 mg/dL — ABNORMAL HIGH (ref 0.61–1.24)
GFR, Estimated: 49 mL/min — ABNORMAL LOW (ref >60)
Glucose, Bld: 115 mg/dL — ABNORMAL HIGH (ref 70–99)
Potassium: 4.2 mmol/L (ref 3.5–5.1)
Sodium: 137 mmol/L (ref 135–145)
Total Bilirubin: 0.3 mg/dL (ref 0.3–1.2)
Total Protein: 6.9 g/dL (ref 6.5–8.1)

## 2022-09-14 LAB — MAGNESIUM: Magnesium: 2 mg/dL (ref 1.7–2.4)

## 2022-09-14 MED ORDER — SODIUM CHLORIDE 0.9% FLUSH
10.0000 mL | INTRAVENOUS | Status: DC | PRN
  Administered 2022-09-14: 10 mL

## 2022-09-14 MED ORDER — SODIUM CHLORIDE 0.9 % IV SOLN
1.2700 mg/kg | Freq: Once | INTRAVENOUS | Status: AC
  Administered 2022-09-14: 100 mg via INTRAVENOUS
  Filled 2022-09-14: qty 10

## 2022-09-14 MED ORDER — SODIUM CHLORIDE 0.9 % IV SOLN: Freq: Once | INTRAVENOUS | Status: AC

## 2022-09-14 MED ORDER — SODIUM CHLORIDE 0.9 % IV SOLN: 8.0000 mg | Freq: Once | INTRAVENOUS | Status: DC

## 2022-09-14 MED ORDER — HEPARIN SOD (PORK) LOCK FLUSH 100 UNIT/ML IV SOLN
500.0000 [IU] | Freq: Once | INTRAVENOUS | Status: AC | PRN
  Administered 2022-09-14: 500 [IU]

## 2022-09-14 MED ORDER — ONDANSETRON HCL 4 MG/2ML IJ SOLN
8.0000 mg | Freq: Once | INTRAMUSCULAR | Status: AC
  Administered 2022-09-14: 8 mg via INTRAVENOUS
  Filled 2022-09-14: qty 4

## 2022-09-14 NOTE — Progress Notes (Signed)
Patient presents today for chemotherapy infusion.  Patient is in satisfactory condition with only complaints of nausea.  MD made aware.  Vital signs are stable.  Patient took Compazine at 0730 at home prior to office visit so we will hold Compazine pre-medication today.  Labs reviewed and all labs are within treatment parameters.  We will proceed with treatment per MD orders.  We will give Zofran 8 mg IV per Dr. Delton Coombes for nausea.    Patient tolerated treatment well with no complaints voiced.  Patient left ambulatory in stable condition.  Vital signs stable at discharge.  Follow up as scheduled.

## 2022-09-14 NOTE — Patient Instructions (Signed)
[facility]  Discharge Instructions: Thank you for choosing [facility] to provide your oncology and hematology care.  If you have a lab appointment with the [facility], please come in thru the Main Entrance and check in at the main information desk.  Wear comfortable clothing and clothing appropriate for easy access to any Portacath or PICC line.   We strive to give you quality time with your provider. You may need to reschedule your appointment if you arrive late (15 or more minutes).  Arriving late affects you and other patients whose appointments are after yours.  Also, if you miss three or more appointments without notifying the office, you may be dismissed from the clinic at the provider's discretion.      For prescription refill requests, have your pharmacy contact our office and allow 72 hours for refills to be completed.    Today you received the following chemotherapy and/or immunotherapy agents Padcev.  Enfortumab Vedotin Injection What is this medication? ENFORTUMAB VEDOTIN (en FORT ue mab ve DOE tin) treats bladder cancer and kidney cancer. It works by blocking a protein that causes cancer cells to grow and multiply. This helps to slow or stop the spread of cancer cells. This medicine may be used for other purposes; ask your health care provider or pharmacist if you have questions. COMMON BRAND NAME(S): PADCEV What should I tell my care team before I take this medication? They need to know if you have any of these conditions: Diabetes Eye disease Liver disease Lung disease Tingling of the fingers or toes or other nerve disorder Vision problems An unusual or allergic reaction to enfortumab vedotin, other medications, foods, dyes, or preservatives Pregnant or trying to get pregnant Breast-feeding How should I use this medication? This medication is injected into a vein. It is given by your care team in a hospital or clinic setting. Talk  to your care team about the use of this medication in children. Special care may be needed. Overdosage: If you think you have taken too much of this medicine contact a poison control center or emergency room at once. NOTE: This medicine is only for you. Do not share this medicine with others. What if I miss a dose? Keep appointments for follow-up doses. It is important not to miss your dose. Call your care team if you are unable to keep an appointment. What may interact with this medication? This medication may affect how other medications work, and other medications may affect how this medication works. Talk with your care team about all of the medications you take. They may suggest changes to your treatment plan to lower the risk of side effects and to make sure your medications work as intended. This list may not describe all possible interactions. Give your health care provider a list of all the medicines, herbs, non-prescription drugs, or dietary supplements you use. Also tell them if you smoke, drink alcohol, or use illegal drugs. Some items may interact with your medicine. What should I watch for while using this medication? Your condition will be monitored carefully while you are receiving this medication. This medication may make you feel generally unwell. This is not uncommon as chemotherapy can affect healthy cells as well as cancer cells. Report any side effects. Continue your course of treatment even though you feel ill unless your care team tells you to stop. This medication may increase blood sugar. The risk may be higher in patients who already have diabetes.  Ask your care team what you can do to lower your risk of diabetes while taking this medication. This medication can cause a serious condition in which there is too much acid in your blood. If you develop nausea, vomiting, stomach pain, unusual tiredness, or trouble breathing, stop taking this medication and call your care team right  away. If possible, use a ketone dipstick to check for ketones in your urine. This medication may cause dry eyes and blurred vision. If you wear contact lenses, you may feel some discomfort. Lubricating eye drops may help. See your care team if the problem does not go away or is severe. Tell your care team right away if you have any change in your eyesight. This medication may increase your risk of getting an infection. Call your care team for advice if you get a fever, chills, sore throat, or other symptoms of a cold or flu. Do not treat yourself. Try to avoid being around people who are sick. Avoid taking medications that contain aspirin, acetaminophen, ibuprofen, naproxen, or ketoprofen unless instructed by your care team. These medications may hide a fever. This medication may cause serious skin reactions. They can happen weeks to months after starting the medication. Contact your care team right away if you notice fevers or flu-like symptoms with a rash. The rash may be red or purple and then turn into blisters or peeling of the skin. You may also notice a red rash with swelling of the face, lips or lymph nodes in your neck or under your arms. Talk to your care team if you or your partner may be pregnant. Serious birth defects can occur if you take this medication during pregnancy and for 2 months after the last dose. You will need a negative pregnancy test before starting this medication. Contraception is recommended while taking this medication and for 2 months after the last dose. Your care team can help you find the option that works for you. If your partner can get pregnant, use a condom during sex while taking this medication and for 4 months after the last dose. Do not breastfeed while taking this medicine or for at least 3 weeks after the last dose. This medication may cause infertility. Talk to your care team if you are concerned about your fertility. What side effects may I notice from  receiving this medication? Side effects that you should report to your care team as soon as possible: Allergic reactions--skin rash, itching, hives, swelling of the face, lips, tongue, or throat Dry cough, shortness of breath or trouble breathing Eye pain, redness, irritation, or discharge with blurry or decreased vision High blood sugar (hyperglycemia)--increased thirst or amount of urine, unusual weakness or fatigue, blurry vision Painful swelling, warmth, or redness of the skin, blisters or sores at the infusion site Pain, tingling, or numbness in the hands or feet Redness, blistering, peeling, or loosening of the skin, including inside the mouth Unusual bruising or bleeding Side effects that usually do not require medical attention (report these to your care team if they continue or are bothersome): Change in taste Diarrhea Dry eyes Fatigue Hair loss Loss of appetite This list may not describe all possible side effects. Call your doctor for medical advice about side effects. You may report side effects to FDA at 1-800-FDA-1088. Where should I keep my medication? This medication is given in a hospital or clinic. It will not be stored at home. NOTE: This sheet is a summary. It may not cover all possible  information. If you have questions about this medicine, talk to your doctor, pharmacist, or health care provider.  2023 Elsevier/Gold Standard (2022-02-07 00:00:00)        To help prevent nausea and vomiting after your treatment, we encourage you to take your nausea medication as directed.  BELOW ARE SYMPTOMS THAT SHOULD BE REPORTED IMMEDIATELY: *FEVER GREATER THAN 100.4 F (38 C) OR HIGHER *CHILLS OR SWEATING *NAUSEA AND VOMITING THAT IS NOT CONTROLLED WITH YOUR NAUSEA MEDICATION *UNUSUAL SHORTNESS OF BREATH *UNUSUAL BRUISING OR BLEEDING *URINARY PROBLEMS (pain or burning when urinating, or frequent urination) *BOWEL PROBLEMS (unusual diarrhea, constipation, pain near the  anus) TENDERNESS IN MOUTH AND THROAT WITH OR WITHOUT PRESENCE OF ULCERS (sore throat, sores in mouth, or a toothache) UNUSUAL RASH, SWELLING OR PAIN  UNUSUAL VAGINAL DISCHARGE OR ITCHING   Items with * indicate a potential emergency and should be followed up as soon as possible or go to the Emergency Department if any problems should occur.  Please show the CHEMOTHERAPY ALERT CARD or IMMUNOTHERAPY ALERT CARD at check-in to the Emergency Department and triage nurse.  Should you have questions after your visit or need to cancel or reschedule your appointment, please contact [facility] [phone]  and follow the prompts.  Office hours are 8:00 a.m. to 4:30 p.m. Monday - Friday. Please note that voicemails left after 4:00 p.m. may not be returned until the following business day.  We are closed weekends and major holidays. You have access to a nurse at all times for urgent questions. Please call the main number to the clinic [phone] and follow the prompts.  For any non-urgent questions, you may also contact your provider using MyChart. We now offer e-Visits for anyone 92 and older to request care online for non-urgent symptoms. For details visit [email or web address].   Also download the MyChart app! Go to the app store, search "MyChart", open the app, select [facility], and log in with your MyChart username and password.  Masks are optional in the cancer centers. If you would like for your care team to wear a mask while they are taking care of you, please let them know. You may have one support person who is at least 80 years old accompany you for your appointments.

## 2022-09-15 ENCOUNTER — Other Ambulatory Visit: Payer: Self-pay

## 2022-09-18 ENCOUNTER — Ambulatory Visit (INDEPENDENT_AMBULATORY_CARE_PROVIDER_SITE_OTHER): Payer: MEDICARE | Admitting: Urology

## 2022-09-18 VITALS — BP 121/68 | HR 116

## 2022-09-18 DIAGNOSIS — N1330 Unspecified hydronephrosis: Secondary | ICD-10-CM

## 2022-09-18 DIAGNOSIS — R3915 Urgency of urination: Secondary | ICD-10-CM | POA: Diagnosis not present

## 2022-09-18 DIAGNOSIS — R350 Frequency of micturition: Secondary | ICD-10-CM | POA: Diagnosis not present

## 2022-09-18 LAB — URINALYSIS, ROUTINE W REFLEX MICROSCOPIC
Bilirubin, UA: NEGATIVE
Glucose, UA: NEGATIVE
Nitrite, UA: NEGATIVE
Specific Gravity, UA: 1.02 (ref 1.005–1.030)
Urobilinogen, Ur: 0.2 mg/dL (ref 0.2–1.0)
pH, UA: 5.5 (ref 5.0–7.5)

## 2022-09-18 LAB — MICROSCOPIC EXAMINATION
RBC, Urine: NONE SEEN /hpf (ref 0–2)
WBC, UA: >30 /hpf — ABNORMAL HIGH (ref 0–5)

## 2022-09-18 LAB — BLADDER SCAN AMB NON-IMAGING: Scan Result: 75

## 2022-09-18 MED ORDER — SULFAMETHOXAZOLE-TRIMETHOPRIM 800-160 MG PO TABS: 1.0000 | ORAL_TABLET | Freq: Two times a day (BID) | ORAL | 0 refills | Status: DC

## 2022-09-18 NOTE — Progress Notes (Signed)
Patient came to office today for UA/UC after starting with urinary frequency and urgency starting last week and getting worse.  Discussed with Dr. Alyson Ingles- orders for UA/UC and PVR- samples of myrbetriq given as well. PVR 75  Bactrim DS sent to pharmacy pending culture per Dr. Alyson Ingles.. patient aware of prescription sent to pharmacy.

## 2022-09-18 NOTE — Patient Instructions (Signed)
We have sent a prescription to your pharmacy- Bactrim. We have sent your urine for culture. We will call you with the results and if any medication change is needed.

## 2022-09-20 ENCOUNTER — Encounter (HOSPITAL_COMMUNITY): Payer: Self-pay | Admitting: Emergency Medicine

## 2022-09-20 ENCOUNTER — Telehealth: Payer: Self-pay

## 2022-09-20 ENCOUNTER — Other Ambulatory Visit: Payer: Self-pay | Admitting: *Deleted

## 2022-09-20 ENCOUNTER — Emergency Department (HOSPITAL_COMMUNITY)
Admission: EM | Admit: 2022-09-20 | Discharge: 2022-09-20 | Disposition: A | Discharge status: Home / Self Care | Payer: MEDICARE | Attending: Emergency Medicine | Admitting: Emergency Medicine

## 2022-09-20 ENCOUNTER — Other Ambulatory Visit: Payer: Self-pay

## 2022-09-20 ENCOUNTER — Emergency Department (HOSPITAL_COMMUNITY): Payer: MEDICARE

## 2022-09-20 DIAGNOSIS — E860 Dehydration: Secondary | ICD-10-CM | POA: Diagnosis not present

## 2022-09-20 DIAGNOSIS — R531 Weakness: Secondary | ICD-10-CM | POA: Diagnosis present

## 2022-09-20 DIAGNOSIS — R110 Nausea: Secondary | ICD-10-CM

## 2022-09-20 DIAGNOSIS — N390 Urinary tract infection, site not specified: Secondary | ICD-10-CM | POA: Diagnosis not present

## 2022-09-20 DIAGNOSIS — Z85528 Personal history of other malignant neoplasm of kidney: Secondary | ICD-10-CM | POA: Diagnosis not present

## 2022-09-20 LAB — URINALYSIS, ROUTINE W REFLEX MICROSCOPIC
Bilirubin Urine: NEGATIVE
Glucose, UA: NEGATIVE mg/dL
Ketones, ur: NEGATIVE mg/dL
Nitrite: POSITIVE — AB
Protein, ur: 100 mg/dL — AB
RBC / HPF: >50 RBC/hpf — ABNORMAL HIGH (ref 0–5)
Specific Gravity, Urine: 1.01 (ref 1.005–1.030)
WBC, UA: >50 WBC/hpf — ABNORMAL HIGH (ref 0–5)
pH: 6 (ref 5.0–8.0)

## 2022-09-20 LAB — CBC
HCT: 30.9 % — ABNORMAL LOW (ref 39.0–52.0)
Hemoglobin: 9.6 g/dL — ABNORMAL LOW (ref 13.0–17.0)
MCH: 19.8 pg — ABNORMAL LOW (ref 26.0–34.0)
MCHC: 31.1 g/dL (ref 30.0–36.0)
MCV: 63.6 fL — ABNORMAL LOW (ref 80.0–100.0)
Platelets: 424 10*3/uL — ABNORMAL HIGH (ref 150–400)
RBC: 4.86 MIL/uL (ref 4.22–5.81)
RDW: 17.6 % — ABNORMAL HIGH (ref 11.5–15.5)
WBC: 10.4 10*3/uL (ref 4.0–10.5)
nRBC: 0 % (ref 0.0–0.2)

## 2022-09-20 LAB — BASIC METABOLIC PANEL
Anion gap: 13 (ref 5–15)
BUN: 28 mg/dL — ABNORMAL HIGH (ref 8–23)
CO2: 26 mmol/L (ref 22–32)
Calcium: 9.2 mg/dL (ref 8.9–10.3)
Chloride: 96 mmol/L — ABNORMAL LOW (ref 98–111)
Creatinine, Ser: 1.9 mg/dL — ABNORMAL HIGH (ref 0.61–1.24)
GFR, Estimated: 35 mL/min — ABNORMAL LOW (ref >60)
Glucose, Bld: 138 mg/dL — ABNORMAL HIGH (ref 70–99)
Potassium: 4.7 mmol/L (ref 3.5–5.1)
Sodium: 135 mmol/L (ref 135–145)

## 2022-09-20 MED ORDER — METOCLOPRAMIDE HCL 5 MG/ML IJ SOLN
10.0000 mg | Freq: Once | INTRAMUSCULAR | Status: AC
  Administered 2022-09-20: 10 mg via INTRAVENOUS
  Filled 2022-09-20: qty 2

## 2022-09-20 MED ORDER — SODIUM CHLORIDE 0.9 % IV BOLUS
1000.0000 mL | Freq: Once | INTRAVENOUS | Status: AC
  Administered 2022-09-20: 1000 mL via INTRAVENOUS

## 2022-09-20 MED ORDER — SODIUM CHLORIDE 0.9 % IV SOLN
1.0000 g | Freq: Once | INTRAVENOUS | Status: AC
  Administered 2022-09-20: 1 g via INTRAVENOUS
  Filled 2022-09-20: qty 10

## 2022-09-20 MED ORDER — METOCLOPRAMIDE HCL 10 MG PO TABS: 10.0000 mg | ORAL_TABLET | Freq: Four times a day (QID) | ORAL | 0 refills | Status: DC | PRN

## 2022-09-20 MED ORDER — OXYCODONE-ACETAMINOPHEN 5-325 MG PO TABS: 1.0000 | ORAL_TABLET | Freq: Once | ORAL | Status: DC

## 2022-09-20 MED ORDER — OXYCODONE-ACETAMINOPHEN 5-325 MG PO TABS
2.0000 | ORAL_TABLET | Freq: Once | ORAL | Status: AC
  Administered 2022-09-20: 2 via ORAL
  Filled 2022-09-20: qty 2

## 2022-09-20 MED ORDER — CEPHALEXIN 500 MG PO CAPS: 500.0000 mg | ORAL_CAPSULE | Freq: Four times a day (QID) | ORAL | 0 refills | Status: DC

## 2022-09-20 MED ORDER — OXYCODONE HCL 10 MG PO TABS: 10.0000 mg | ORAL_TABLET | Freq: Three times a day (TID) | ORAL | 0 refills | Status: DC | PRN

## 2022-09-20 MED ORDER — SODIUM CHLORIDE 0.9 % IV SOLN: Freq: Once | INTRAVENOUS | Status: AC

## 2022-09-20 NOTE — Telephone Encounter (Signed)
Patient wife called today- patient symptoms have not improved with urinary urgency and frequency along with pain since being seen Monday and treated with abx pending urine culture.  Wife reports increase in weakness as well and decreased appetite. Reviewed with Dr. Alyson Ingles- urine culture still pending. Advised [facility] to take [facility] to [facility] for medical evaluation Wife voiced understanding.

## 2022-09-20 NOTE — ED Notes (Signed)
Patient to CT at this time

## 2022-09-20 NOTE — ED Triage Notes (Signed)
Pt c/o gen weakness and body aches x 2 weeks. Pt currently taking chemo for stage 4 bladder cancer. Pt having urinary changes. Pt is a/o. Color wnl. Denies fever/d/v. C/o nausea.

## 2022-09-20 NOTE — ED Notes (Signed)
Pt resting in recliner. Nad at this time. Awaiting EDP /PA assessement

## 2022-09-20 NOTE — ED Notes (Signed)
EKG given to dr zammit

## 2022-09-20 NOTE — ED Provider Notes (Signed)
[facility] EMERGENCY DEPARTMENT Provider Note   CSN: [id number] Arrival date & time: 09/20/22  1144     History  Chief Complaint  Patient presents with   Weakness    Joshua Osborne is a 81 y.o. male with a history of left urothelial kidney cancer who underwent resection August 2023, with recent PET scan indicating some metastatic changes, currently undergoing immunotherapy under the guidance of Dr. Delton Coombes, also was diagnosed with a UTI and started Bactrim 2 days ago along with mybetriq but continues to have urinary frequency and dysuria.  He states he has been unable to sleep at night, having to go to the bathroom roughly every 30 minutes.  He has had no documented or subjective fevers or chills.  He endorses generalized weakness for about 2 weeks now, wife at bedside indicates he has had poor p.o. intake over the course of time.  He is currently on Compazine which he states helps his chronic nausea some but not completely.  He has tried Zofran and Phenergan also, neither of these medicines were helpful.  To his knowledge he has not tried Reglan.  He denies vomiting, chest pain, shortness of breath.  He does endorse intermittent headaches, not currently.  He does report some cramping pain across to his suprapubic region and endorses some achiness in his right lower back.  He does have a history of kidney stones, he does not endorse pain similar to prior kidney stone attacks.     The history is provided by the patient.       Home Medications Prior to Admission medications   Medication Sig Start Date End Date Taking? Authorizing Provider  cephALEXin (KEFLEX) 500 MG capsule Take 1 capsule (500 mg total) by mouth 4 (four) times daily for 7 days. 09/20/22 09/27/22 Yes Jamilla Galli, Almyra Free, PA-C  metoCLOPramide (REGLAN) 10 MG tablet Take 1 tablet (10 mg total) by mouth every 6 (six) hours as needed for nausea or vomiting. 09/20/22  Yes Flay Ghosh, Almyra Free, PA-C  citalopram (CELEXA) 20 MG tablet  Take 20 mg by mouth daily.    [provider]  docusate sodium (COLACE) 100 MG capsule Take 1 capsule (100 mg total) by mouth 2 (two) times daily as needed for mild constipation. 06/02/22 06/02/23  Ceasar Mons, MD  Enfortumab Vedotin-ejfv (PADCEV IV) Inject into the vein once a week. Days 1 and 8 every 21 days 08/17/22   [provider]  fluorouracil (EFUDEX) 5 % cream Apply 1 Application topically 2 (two) times daily as needed (cancer spots).    [provider]  latanoprost (XALATAN) 0.005 % ophthalmic solution Place 1 drop into both eyes at bedtime. 12/19/21   [provider]  lidocaine-prilocaine (EMLA) cream Apply a small amount to port a cath site and cover with plastic wrap 1 hour prior to infusion 08/17/22   Derek Jack, MD  omeprazole (PRILOSEC) 20 MG capsule Take 20 mg by mouth 2 (two) times daily.    [provider]  Oxycodone HCl 10 MG TABS Take 1 tablet (10 mg total) by mouth every 8 (eight) hours as needed. 09/20/22   Derek Jack, MD  PEMBROLIZUMAB IV Inject into the vein every 21 ( twenty-one) days. 08/17/22   [provider]  prochlorperazine (COMPAZINE) 10 MG tablet Take 1 tablet (10 mg total) by mouth every 6 (six) hours as needed for nausea or vomiting. 08/24/22   Derek Jack, MD  simvastatin (ZOCOR) 40 MG tablet Take 40 mg by mouth daily.  [provider]  sulfamethoxazole-trimethoprim (BACTRIM DS) 800-160 MG tablet Take 1 tablet by mouth every 12 (twelve) hours. 09/18/22   McKenzie, Candee Furbish, MD  tamsulosin (FLOMAX) 0.4 MG CAPS capsule Take 0.4 mg by mouth at bedtime.    [provider]  timolol (BETIMOL) 0.5 % ophthalmic solution Place 1 drop into both eyes daily.    [provider]  tizanidine (ZANAFLEX) 2 MG capsule Take 2 mg by mouth at bedtime.    [provider]      Allergies    Patient has no known allergies.    Review of Systems   Review of  Systems  Constitutional:  Negative for chills and fever.  HENT:  Negative for congestion and sore throat.   Eyes: Negative.   Respiratory:  Negative for chest tightness and shortness of breath.   Cardiovascular:  Negative for chest pain.  Gastrointestinal:  Positive for abdominal pain. Negative for nausea.  Genitourinary:  Positive for dysuria, frequency and urgency.  Musculoskeletal:  Negative for arthralgias, joint swelling and neck pain.  Skin: Negative.  Negative for rash and wound.  Neurological:  Negative for dizziness, weakness, light-headedness, numbness and headaches.  Psychiatric/Behavioral: Negative.      Physical Exam Updated Vital Signs BP (!) 125/53   Pulse 97   Temp 98.9 F (37.2 C) (Oral)   Resp 12   SpO2 96%  Physical Exam Vitals and nursing note reviewed.  Constitutional:      Appearance: He is well-developed.  HENT:     Head: Normocephalic and atraumatic.  Eyes:     Conjunctiva/sclera: Conjunctivae normal.  Cardiovascular:     Rate and Rhythm: Normal rate and regular rhythm.     Heart sounds: Normal heart sounds.  Pulmonary:     Effort: Pulmonary effort is normal.     Breath sounds: Normal breath sounds. No wheezing.  Abdominal:     General: Bowel sounds are normal.     Palpations: Abdomen is soft.     Tenderness: There is abdominal tenderness in the suprapubic area. There is no guarding.  Musculoskeletal:        General: Normal range of motion.     Cervical back: Normal range of motion.  Skin:    General: Skin is warm and dry.  Neurological:     Mental Status: He is alert.     ED Results / Procedures / Treatments   Labs (all labs ordered are listed, but only abnormal results are displayed) Labs Reviewed  BASIC METABOLIC PANEL - Abnormal; Notable for the following components:      Result Value   Chloride 96 (*)    Glucose, Bld 138 (*)    BUN 28 (*)    Creatinine, Ser 1.90 (*)    GFR, Estimated 35 (*)    All other components within normal  limits  CBC - Abnormal; Notable for the following components:   Hemoglobin 9.6 (*)    HCT 30.9 (*)    MCV 63.6 (*)    MCH 19.8 (*)    RDW 17.6 (*)    Platelets 424 (*)    All other components within normal limits  URINALYSIS, ROUTINE W REFLEX MICROSCOPIC - Abnormal; Notable for the following components:   Color, Urine AMBER (*)    APPearance TURBID (*)    Hgb urine dipstick MODERATE (*)    Protein, ur 100 (*)    Nitrite POSITIVE (*)    Leukocytes,Ua LARGE (*)    RBC / HPF >  50 (*)    WBC, UA >50 (*)    Bacteria, UA MANY (*)    All other components within normal limits    EKG None  Radiology CT Renal Stone Study  Result Date: 09/20/2022 CLINICAL DATA:  Metastatic bladder cancer. History of renal cell carcinoma left nephrectomy. Abdominal and flank pain with weakness and body aches for 2 weeks. Currently on antibiotic therapy for urinary tract infection. * Tracking Code: BO * EXAM: CT ABDOMEN AND PELVIS WITHOUT CONTRAST TECHNIQUE: Multidetector CT imaging of the abdomen and pelvis was performed following the standard protocol without IV contrast. RADIATION DOSE REDUCTION: This exam was performed according to the departmental dose-optimization program which includes automated exposure control, adjustment of the mA and/or kV according to patient size and/or use of iterative reconstruction technique. COMPARISON:  Multiple exams, including PET-CT 08/03/2022 and CT abdomen from 04/25/2022 FINDINGS: Lower chest: Bibasilar pulmonary nodules are observed. Pleural-based right middle lobe nodule measures 1.4 by 0.8 cm on image 7 series 4, previously 1.1 by 0.7 cm on 08/03/2022. A right lower lobe nodule measures 0.9 by 0.8 cm on image 6 series 4, previously 0.7 by 0.6 cm. Other nodules appear roughly similar to previous. Descending thoracic aortic and circumflex coronary artery atherosclerotic calcification. Hepatobiliary: Stable calcified liver lesions, not previously hypermetabolic.  Cholecystectomy. No biliary dilatation. Pancreas: Unremarkable Spleen: Unremarkable Adrenals/Urinary Tract: Left nephrectomy. Adrenal glands and right kidney unremarkable in noncontrast CT appearance. There is gas in the urinary bladder. Abnormal irregular wall thickening in the urinary bladder especially along the left inferior margin. This may reflect bladder tumor. Stomach/Bowel: Sigmoid colon diverticulosis. Vascular/Lymphatic: Atherosclerosis is present, including aortoiliac atherosclerotic disease. There is substantial atheromatous calcification at the origin of the celiac trunk and SMA and tracking within the proximal SMA. Vascular patency not assessed. Previously hypermetabolic left posterior periaortic adenopathy 1.1 cm in short axis on image 36 series 2, stable. Reproductive: Marked prostatomegaly, with slightly hazy margins potentially due to local therapy or prostatitis. Other: Broad left Spigelian hernia contains small bowel, image 52 series 2, no strangulation or obstruction. Musculoskeletal: Degenerative bilateral hip arthropathy. Grade 1 anterolisthesis of L4 on L5 with left pars defect at L4. In conjunction with lumbar spondylosis and degenerative disc disease there is bilateral foraminal impingement at the L4-5 and L5-S1 levels. Small supraumbilical hernia contains adipose tissue on image 68 series 6. IMPRESSION: 1. Abnormal irregular wall thickening in the urinary bladder especially along the left inferior margin, compatible with bladder tumor. There is gas in the urinary bladder which could be from recent instrumentation or from gas-forming organism. 2. Bibasilar pulmonary nodules, some of which have mildly enlarged compared to 08/03/2022, previously hypermetabolic, compatible with metastatic disease. 3. Stable 1.1 cm left posterior periaortic lymph node, previously hypermetabolic. 4. Marked prostatomegaly, with slightly hazy margins potentially due to local therapy or prostatitis. 5. Broad left  Spigelian hernia contains small bowel, no strangulation or obstruction. 6. Lower lumbar impingement. 7. Sigmoid colon diverticulosis. 8. Marked atheromatous calcification at the origin of the celiac trunk and SMA. Vascular patency not assessed on today's noncontrast exam. 9. Small supraumbilical hernia contains adipose tissue. 10. Degenerative bilateral hip arthropathy. Grade 1 anterolisthesis of L4 on L5 with left pars defect at L4. In conjunction with lumbar spondylosis and degenerative disc disease, there is bilateral foraminal impingement at L4-5 and L5-S1. 11. Stable calcified liver lesions, not previously hypermetabolic. 12. Aortic atherosclerosis. Aortic Atherosclerosis (ICD10-I70.0). Electronically Signed   By: Van Clines M.D.   On: 09/20/2022 18:12  Procedures Procedures    Medications Ordered in ED Medications  cefTRIAXone (ROCEPHIN) 1 g in sodium chloride 0.9 % 100 mL IVPB (0 g Intravenous Stopped 09/20/22 1811)  metoCLOPramide (REGLAN) injection 10 mg (10 mg Intravenous Given 09/20/22 1724)  sodium chloride 0.9 % bolus 1,000 mL (0 mLs Intravenous Stopped 09/20/22 1947)  oxyCODONE-acetaminophen (PERCOCET/ROXICET) 5-325 MG per tablet 2 tablet (2 tablets Oral Given 09/20/22 1738)  0.9 %  sodium chloride infusion ( Intravenous New Bag/Given 09/20/22 1813)    ED Course/ Medical Decision Making/ A&P                           Medical Decision Making Patient presenting with continued dysuria despite day 2 of Bactrim.  Prescribed by urology, according to notes found in patient's chart as of this afternoon his urine culture obtained 2 days ago has not resulted yet.  Generalized chronic nausea, poor p.o. intake over the past 2 weeks, nausea not well-controlled with Compazine.  History of left kidney cancer with left nephrectomy 4 months out.  Does have a history of kidney stones, no obvious right flank pain.  He has been compliant with his Bactrim, his UA today suggest pretty  significant infection, we will give him IV Rocephin and will plan Keflex.  Given his history of kidney stones noncontrast renal CT has been ordered to rule out this possibility.  Patient was also given an IV dose of Reglan here after which he was able to eat and drink, he ate a full meal and was feeling much improved and ready for discharge home.  Wife at bedside states he ate more this evening here than in the past several days at home.  He will be prescribed some Reglan for home use in place of his Compazine as needed for nausea.  Of note, he does have follow-up care with Dr. Delton Coombes tomorrow.  Amount and/or Complexity of Data Reviewed Labs: ordered.    Details: Pertinent labs including a hemoglobin of 9.6, this is a chronic finding.  His BUN is 28 and his creatinine is 1.9 which is elevated in comparison to his chronic creatinine around the 1.5-1.6 range.  He was given 2 L of normal saline while here which I suspect will have greatly improve this number.  As mentioned above, he does have a significant continued UTI with moderate hemoglobin, nitrite positive, large leuks and many bacteria. Radiology: ordered.    Details: CT renal study was completed to rule out right renal stone which is negative for this finding.  Other findings consistent with his recent PET scan changes.  Risk Prescription drug management. Decision regarding hospitalization. Risk Details: Discussed hospitalization versus discharge home, patient feels much improved at time of discharge and is desirous of discharge to home.           Final Clinical Impression(s) / ED Diagnoses Final diagnoses:  Lower urinary tract infectious disease  Dehydration  Chronic nausea    Rx / DC Orders ED Discharge Orders          Ordered    cephALEXin (KEFLEX) 500 MG capsule  4 times daily        09/20/22 1946    metoCLOPramide (REGLAN) 10 MG tablet  Every 6 hours PRN        09/20/22 1946              Landis Martins 09/20/22 1948    Elgie Congo, MD 09/21/22 [phone]

## 2022-09-20 NOTE — Discharge Instructions (Signed)
Take your next dose of the antibiotic tomorrow morning.  I also recommend using the Reglan prescribed in place of your Compazine which seems to have offered you better nausea relief.  Plan to follow-up with Dr. Delton Coombes tomorrow as you have scheduled.

## 2022-09-20 NOTE — ED Notes (Signed)
Patient called out for another pillow case. Upon going in room, the patient and patients family requested to speak with MD or PA. MD and PA notified.

## 2022-09-21 ENCOUNTER — Inpatient Hospital Stay: Payer: MEDICARE

## 2022-09-21 ENCOUNTER — Inpatient Hospital Stay (HOSPITAL_BASED_OUTPATIENT_CLINIC_OR_DEPARTMENT_OTHER): Payer: MEDICARE | Admitting: Hematology

## 2022-09-21 VITALS — BP 140/68 | HR 115 | Temp 96.2°F | Resp 20 | Ht 72.0 in | Wt 166.2 lb

## 2022-09-21 DIAGNOSIS — D649 Anemia, unspecified: Secondary | ICD-10-CM

## 2022-09-21 DIAGNOSIS — C652 Malignant neoplasm of left renal pelvis: Secondary | ICD-10-CM

## 2022-09-21 DIAGNOSIS — Z95828 Presence of other vascular implants and grafts: Secondary | ICD-10-CM

## 2022-09-21 DIAGNOSIS — R110 Nausea: Secondary | ICD-10-CM

## 2022-09-21 DIAGNOSIS — Z5112 Encounter for antineoplastic immunotherapy: Secondary | ICD-10-CM | POA: Diagnosis not present

## 2022-09-21 LAB — CBC WITH DIFFERENTIAL/PLATELET
Abs Immature Granulocytes: 0.06 10*3/uL (ref 0.00–0.07)
Basophils Absolute: 0.1 10*3/uL (ref 0.0–0.1)
Basophils Relative: 1 %
Eosinophils Absolute: 0.4 10*3/uL (ref 0.0–0.5)
Eosinophils Relative: 4 %
HCT: 29.7 % — ABNORMAL LOW (ref 39.0–52.0)
Hemoglobin: 9.2 g/dL — ABNORMAL LOW (ref 13.0–17.0)
Immature Granulocytes: 1 %
Lymphocytes Relative: 8 %
Lymphs Abs: 0.8 10*3/uL (ref 0.7–4.0)
MCH: 19.8 pg — ABNORMAL LOW (ref 26.0–34.0)
MCHC: 31 g/dL (ref 30.0–36.0)
MCV: 63.9 fL — ABNORMAL LOW (ref 80.0–100.0)
Monocytes Absolute: 1.1 10*3/uL — ABNORMAL HIGH (ref 0.1–1.0)
Monocytes Relative: 10 %
Neutro Abs: 8.1 10*3/uL — ABNORMAL HIGH (ref 1.7–7.7)
Neutrophils Relative %: 76 %
Platelets: 432 10*3/uL — ABNORMAL HIGH (ref 150–400)
RBC: 4.65 MIL/uL (ref 4.22–5.81)
RDW: 17.2 % — ABNORMAL HIGH (ref 11.5–15.5)
WBC Morphology: REACTIVE
WBC: 10.5 10*3/uL (ref 4.0–10.5)
nRBC: 0 % (ref 0.0–0.2)

## 2022-09-21 LAB — COMPREHENSIVE METABOLIC PANEL
ALT: 18 U/L (ref 0–44)
AST: 26 U/L (ref 15–41)
Albumin: 3.6 g/dL (ref 3.5–5.0)
Alkaline Phosphatase: 73 U/L (ref 38–126)
Anion gap: 9 (ref 5–15)
BUN: 24 mg/dL — ABNORMAL HIGH (ref 8–23)
CO2: 24 mmol/L (ref 22–32)
Calcium: 9.1 mg/dL (ref 8.9–10.3)
Chloride: 100 mmol/L (ref 98–111)
Creatinine, Ser: 1.75 mg/dL — ABNORMAL HIGH (ref 0.61–1.24)
GFR, Estimated: 39 mL/min — ABNORMAL LOW (ref >60)
Glucose, Bld: 156 mg/dL — ABNORMAL HIGH (ref 70–99)
Potassium: 4.4 mmol/L (ref 3.5–5.1)
Sodium: 133 mmol/L — ABNORMAL LOW (ref 135–145)
Total Bilirubin: 0.3 mg/dL (ref 0.3–1.2)
Total Protein: 7.4 g/dL (ref 6.5–8.1)

## 2022-09-21 LAB — MAGNESIUM: Magnesium: 1.7 mg/dL (ref 1.7–2.4)

## 2022-09-21 MED ORDER — HEPARIN SOD (PORK) LOCK FLUSH 100 UNIT/ML IV SOLN
500.0000 [IU] | Freq: Once | INTRAVENOUS | Status: AC
  Administered 2022-09-21: 500 [IU] via INTRAVENOUS

## 2022-09-21 MED ORDER — ONDANSETRON HCL 4 MG/2ML IJ SOLN
8.0000 mg | Freq: Once | INTRAMUSCULAR | Status: AC
  Administered 2022-09-21: 8 mg via INTRAVENOUS
  Filled 2022-09-21: qty 4

## 2022-09-21 MED ORDER — MAGNESIUM SULFATE 2 GM/50ML IV SOLN
2.0000 g | Freq: Once | INTRAVENOUS | Status: AC
  Administered 2022-09-21: 2 g via INTRAVENOUS
  Filled 2022-09-21: qty 50

## 2022-09-21 MED ORDER — SODIUM CHLORIDE 0.9 % IV SOLN: 8.0000 mg | Freq: Once | INTRAVENOUS | Status: DC

## 2022-09-21 MED ORDER — POTASSIUM CHLORIDE IN NACL 20-0.9 MEQ/L-% IV SOLN
Freq: Once | INTRAVENOUS | Status: AC
  Filled 2022-09-21: qty 1000

## 2022-09-21 MED ORDER — ONDANSETRON 8 MG PO TBDP: 8.0000 mg | ORAL_TABLET | Freq: Three times a day (TID) | ORAL | 3 refills | Status: DC | PRN

## 2022-09-21 MED ORDER — SODIUM CHLORIDE 0.9% FLUSH
10.0000 mL | INTRAVENOUS | Status: DC | PRN
  Administered 2022-09-21: 10 mL via INTRAVENOUS

## 2022-09-21 MED ORDER — SODIUM CHLORIDE 0.9% FLUSH
10.0000 mL | Freq: Once | INTRAVENOUS | Status: AC
  Administered 2022-09-21: 10 mL via INTRAVENOUS

## 2022-09-21 NOTE — Patient Instructions (Addendum)
[place] at [facility] Discharge Instructions   You were seen and examined today by Dr. Delton Coombes.  He reviewed the results of your lab work which are normal/stable.   We will give you IV fluids today. We will also give you nausea medication (Zofran) through IV in the clinic. We will also send a prescription for Zofran pills for you to use at home.   Return as scheduled.    Thank you for choosing [place] at [facility] to provide your oncology and hematology care.  To afford each patient quality time with our provider, please arrive at least 15 minutes before your scheduled appointment time.   If you have a lab appointment with the [facility] please come in thru the Main Entrance and check in at the main information desk.  You need to re-schedule your appointment should you arrive 10 or more minutes late.  We strive to give you quality time with our providers, and arriving late affects you and other patients whose appointments are after yours.  Also, if you no show three or more times for appointments you may be dismissed from the clinic at the providers discretion.     Again, thank you for choosing [facility].  Our hope is that these requests will decrease the amount of time that you wait before being seen by our physicians.       _____________________________________________________________  Should you have questions after your visit to [facility], please contact our office at [phone] and follow the prompts.  Our office hours are 8:00 a.m. and 4:30 p.m. Monday - Friday.  Please note that voicemails left after 4:00 p.m. may not be returned until the following business day.  We are closed weekends and major holidays.  You do have access to a nurse 24-7, just call the main number to the clinic [phone] and do not press any options, hold on the line and a nurse will answer the phone.    For  prescription refill requests, have your pharmacy contact our office and allow 72 hours.    Due to Covid, you will need to wear a mask upon entering the hospital. If you do not have a mask, a mask will be given to you at the Main Entrance upon arrival. For doctor visits, patients may have 1 support person age 23 or older with them. For treatment visits, patients can not have anyone with them due to social distancing guidelines and our immunocompromised population.

## 2022-09-21 NOTE — Progress Notes (Signed)
Patient presents today for house IVF per Dr. Delton Coombes.  Patient is in satisfactory condition with only complaints of nausea.  We will also give Zofran 8 mg IV x one dose today per Dr. Delton Coombes.  Vital signs are stable.  We will proceed with fluids per MD orders.  Patient tolerated IVF well with no complaints voiced.  Patient left ambulatory in stable condition.  Vital signs stable at discharge.  Follow up as scheduled.

## 2022-09-21 NOTE — Patient Instructions (Signed)
[facility]  Discharge Instructions: Thank you for choosing  Cancer Center to provide your oncology and hematology care.  If you have a lab appointment with the Cancer Center, please come in thru the Main Entrance and check in at the main information desk.  Wear comfortable clothing and clothing appropriate for easy access to any Portacath or PICC line.   We strive to give you quality time with your provider. You may need to reschedule your appointment if you arrive late (15 or more minutes).  Arriving late affects you and other patients whose appointments are after yours.  Also, if you miss three or more appointments without notifying the office, you may be dismissed from the clinic at the provider's discretion.      For prescription refill requests, have your pharmacy contact our office and allow 72 hours for refills to be completed.     To help prevent nausea and vomiting after your treatment, we encourage you to take your nausea medication as directed.  BELOW ARE SYMPTOMS THAT SHOULD BE REPORTED IMMEDIATELY: *FEVER GREATER THAN 100.4 F (38 C) OR HIGHER *CHILLS OR SWEATING *NAUSEA AND VOMITING THAT IS NOT CONTROLLED WITH YOUR NAUSEA MEDICATION *UNUSUAL SHORTNESS OF BREATH *UNUSUAL BRUISING OR BLEEDING *URINARY PROBLEMS (pain or burning when urinating, or frequent urination) *BOWEL PROBLEMS (unusual diarrhea, constipation, pain near the anus) TENDERNESS IN MOUTH AND THROAT WITH OR WITHOUT PRESENCE OF ULCERS (sore throat, sores in mouth, or a toothache) UNUSUAL RASH, SWELLING OR PAIN  UNUSUAL VAGINAL DISCHARGE OR ITCHING   Items with * indicate a potential emergency and should be followed up as soon as possible or go to the Emergency Department if any problems should occur.  Please show the CHEMOTHERAPY ALERT CARD or IMMUNOTHERAPY ALERT CARD at check-in to the Emergency Department and triage nurse.  Should you have questions after your visit or need to  cancel or reschedule your appointment, please contact [facility] [phone]  and follow the prompts.  Office hours are 8:00 a.m. to 4:30 p.m. Monday - Friday. Please note that voicemails left after 4:00 p.m. may not be returned until the following business day.  We are closed weekends and major holidays. You have access to a nurse at all times for urgent questions. Please call the main number to the clinic [phone] and follow the prompts.  For any non-urgent questions, you may also contact your provider using MyChart. We now offer e-Visits for anyone 18 and older to request care online for non-urgent symptoms. For details visit [email or web address].   Also download the MyChart app! Go to the app store, search "MyChart", open the app, select , and log in with your MyChart username and password.  Masks are optional in the cancer centers. If you would like for your care team to wear a mask while they are taking care of you, please let them know. You may have one support person who is at least 80 years old accompany you for your appointments.  

## 2022-09-21 NOTE — Progress Notes (Signed)
[place], [place] [address]   CLINIC:  Medical Oncology/Hematology  PCP:  Earney Mallet, MD 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 Kass Herberger, a 81 y.o. 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  He completed cycle 2-day 8 last week.  Reported nausea and decreased eating and drinking.  He was reportedly started on Bactrim on Monday by Dr. Alyson Ingles.  He went to the ER yesterday and received some fluids.  They started him on Reglan which seemed to help better than Compazine.  REVIEW OF SYSTEMS:  Review of Systems  Respiratory:  Positive for shortness of breath.   Gastrointestinal:  Positive for constipation, diarrhea and nausea.  Musculoskeletal:  Positive for back pain.  Neurological:  Positive for dizziness.  Psychiatric/Behavioral:  The patient is nervous/anxious.   All other systems reviewed and are negative.   PAST MEDICAL/SURGICAL HISTORY:  Past Medical History:  Diagnosis Date   Anemia    Anxiety    Arthritis    Bladder tumor    Cancer ([facility])    hx of skin cancer, renal cancer   Diabetes mellitus without complication (HCC)    GERD (gastroesophageal reflux disease)    Heart murmur    as a child   History of kidney stones    Port-A-Cath in place 08/17/2022   Sleep apnea    cpap   Past Surgical History:  Procedure Laterality Date   bilateral dupreyen contracture surgery      CHOLECYSTECTOMY     CYSTOSCOPY N/A 05/30/2022   Procedure: CYSTOSCOPY WITH TRANSURETHRAL RESECTION OF THE LEFT URETERAL ORIFICE,  TRANSURETHRAL RESECTION OF  BLADDER TUMOR;  Surgeon: Ceasar Mons, MD;  Location: [facility];  Service: Urology;  Laterality: N/A;   CYSTOSCOPY N/A 09/06/2022   Procedure: CYSTOSCOPY;  Surgeon: Cleon Gustin, MD;  Location: [facility];  Service: Urology;  Laterality: N/A;   CYSTOSCOPY WITH RETROGRADE PYELOGRAM, URETEROSCOPY AND STENT PLACEMENT Left 04/26/2022   Procedure: CYSTOSCOPY WITH RETROGRADE PYELOGRAM, URETEROSCOPY AND STENT PLACEMENT;  Surgeon: Cleon Gustin, MD;  Location: [facility];  Service: Urology;  Laterality: Left;   IR IMAGING GUIDED PORT INSERTION  08/16/2022   LIVER BIOPSY     ROBOT ASSITED LAPAROSCOPIC NEPHROURETERECTOMY Left 05/30/2022   Procedure: XI ROBOT ASSITED LAPAROSCOPIC NEPHROURETERECTOMY;  Surgeon: Ceasar Mons, MD;  Location: [facility];  Service: Urology;  Laterality: Left;   TRANSURETHRAL RESECTION OF BLADDER TUMOR  04/26/2022   Procedure: TRANSURETHRAL RESECTION OF BLADDER TUMOR (TURBT);  Surgeon: Cleon Gustin, MD;  Location: [facility];  Service: Urology;;   TRANSURETHRAL RESECTION OF BLADDER TUMOR N/A 09/06/2022   Procedure: TRANSURETHRAL RESECTION OF BLADDER TUMOR (TURBT);  Surgeon: Cleon Gustin, MD;  Location: [facility];  Service: Urology;  Laterality: N/A;    SOCIAL HISTORY:  Social History   Socioeconomic History   Marital status: Married    Spouse name: Not on file   Number of children: Not on file   Years of education: Not on file   Highest education level: Not on file  Occupational History   Not on file  Tobacco Use   Smoking status: Former  Types: Cigarettes   Smokeless tobacco: Never  Vaping Use   Vaping Use: Never used  Substance and Sexual Activity   Alcohol use: Never   Drug use: Never   Sexual activity: Not Currently  Other Topics Concern   Not on file  Social History Narrative   Not on file   Social Determinants of Health   Financial Resource Strain: Not on file  Food Insecurity: Not on file  Transportation Needs: Not on  file  Physical Activity: Not on file  Stress: Not on file  Social Connections: Not on file  Intimate Partner Violence: Not on file    FAMILY HISTORY:  No family history on file.  CURRENT MEDICATIONS:  Current Outpatient Medications  Medication Sig Dispense Refill   cephALEXin (KEFLEX) 500 MG capsule Take 1 capsule (500 mg total) by mouth 4 (four) times daily for 7 days. 28 capsule 0   citalopram (CELEXA) 20 MG tablet Take 20 mg by mouth daily.     docusate sodium (COLACE) 100 MG capsule Take 1 capsule (100 mg total) by mouth 2 (two) times daily as needed for mild constipation. 30 capsule 0   Enfortumab Vedotin-ejfv (PADCEV IV) Inject into the vein once a week. Days 1 and 8 every 21 days     fluorouracil (EFUDEX) 5 % cream Apply 1 Application topically 2 (two) times daily as needed (cancer spots).     latanoprost (XALATAN) 0.005 % ophthalmic solution Place 1 drop into both eyes at bedtime.     lidocaine-prilocaine (EMLA) cream Apply a small amount to port a cath site and cover with plastic wrap 1 hour prior to infusion 30 g 3   metoCLOPramide (REGLAN) 10 MG tablet Take 1 tablet (10 mg total) by mouth every 6 (six) hours as needed for nausea or vomiting. 30 tablet 0   omeprazole (PRILOSEC) 20 MG capsule Take 20 mg by mouth 2 (two) times daily.     Oxycodone HCl 10 MG TABS Take 1 tablet (10 mg total) by mouth every 8 (eight) hours as needed. 60 tablet 0   PEMBROLIZUMAB IV Inject into the vein every 21 ( twenty-one) days.     prochlorperazine (COMPAZINE) 10 MG tablet Take 1 tablet (10 mg total) by mouth every 6 (six) hours as needed for nausea or vomiting. 60 tablet 3   simvastatin (ZOCOR) 40 MG tablet Take 40 mg by mouth daily.     sulfamethoxazole-trimethoprim (BACTRIM DS) 800-160 MG tablet Take 1 tablet by mouth every 12 (twelve) hours. 14 tablet 0   tamsulosin (FLOMAX) 0.4 MG CAPS capsule Take 0.4 mg by mouth at bedtime.     timolol (BETIMOL) 0.5 % ophthalmic solution Place 1 drop into  both eyes daily.     tizanidine (ZANAFLEX) 2 MG capsule Take 2 mg by mouth at bedtime.     No current facility-administered medications for this visit.    ALLERGIES:  No Known Allergies  PHYSICAL EXAM:  Performance status (ECOG): 1 - Symptomatic but completely ambulatory  There were no vitals filed for this visit.  Wt Readings from Last 3 Encounters:  09/14/22 172 lb 12.8 oz (78.4 kg)  09/07/22 176 lb 6.4 oz (80 kg)  09/06/22 176 lb 5.9 oz (80 kg)   Physical Exam Vitals reviewed.  Constitutional:      Appearance: Normal appearance.  Cardiovascular:     Rate and Rhythm: Normal rate and regular rhythm.     Pulses: Normal pulses.     Heart sounds: Normal heart sounds.  Pulmonary:     Effort: Pulmonary effort is normal.     Breath sounds: Normal breath sounds.  Neurological:     General: No focal deficit present.     Mental Status: He is alert and oriented to person, place, and time.  Psychiatric:        Mood and Affect: Mood normal.        Behavior: Behavior normal.     LABORATORY DATA:  I have reviewed the labs as listed.     Latest Ref Rng & Units 09/20/2022    1:03 PM 09/14/2022    8:05 AM 09/07/2022    8:25 AM  CBC  WBC 4.0 - 10.5 K/uL 10.4  11.1  14.2   Hemoglobin 13.0 - 17.0 g/dL 9.6  8.9  8.7   Hematocrit 39.0 - 52.0 % 30.9  28.6  28.7   Platelets 150 - 400 K/uL 424  390  399       Latest Ref Rng & Units 09/20/2022    1:03 PM 09/14/2022    8:05 AM 09/07/2022    8:25 AM  CMP  Glucose 70 - 99 mg/dL 138  115  188   BUN 8 - 23 mg/dL _0 Creatinine 0.61 - 1.24 mg/dL 1.90  1.45  1.68   Sodium 135 - 145 mmol/L 135  137  137   Potassium 3.5 - 5.1 mmol/L 4.7  4.2  4.2   Chloride 98 - 111 mmol/L 96  101  102   CO2 22 - 32 mmol/L _1 Calcium 8.9 - 10.3 mg/dL 9.2  8.8  8.5   Total Protein 6.5 - 8.1 g/dL  6.9  7.2   Total Bilirubin 0.3 - 1.2 mg/dL  0.3  0.7   Alkaline Phos 38 - 126 U/L  70  81   AST 15 - 41 U/L  17  31   ALT 0 - 44 U/L  12   25       Component Value Date/Time   RBC 4.86 09/20/2022 1303   MCV 63.6 (L) 09/20/2022 1303   MCH 19.8 (L) 09/20/2022 1303   MCHC 31.1 09/20/2022 1303   RDW 17.6 (H) 09/20/2022 1303   LYMPHSABS 1.1 09/14/2022 0805   MONOABS 1.2 (H) 09/14/2022 0805   EOSABS 0.4 09/14/2022 0805   BASOSABS 0.1 09/14/2022 0805    DIAGNOSTIC IMAGING:  I have independently reviewed the scans and discussed with the patient. CT Renal Stone Study  Result Date: 09/20/2022 CLINICAL DATA:  Metastatic bladder cancer. History of renal cell carcinoma left nephrectomy. Abdominal and flank pain with weakness and body aches for 2 weeks. Currently on antibiotic therapy for urinary tract infection. * Tracking Code: BO * EXAM: CT ABDOMEN AND PELVIS WITHOUT CONTRAST TECHNIQUE: Multidetector CT imaging of the abdomen and pelvis was performed following the standard protocol without IV contrast. RADIATION DOSE REDUCTION: This exam was performed according to the departmental dose-optimization program which includes automated exposure control, adjustment of the mA and/or kV according to patient size and/or use of iterative reconstruction technique. COMPARISON:  Multiple exams, including PET-CT 08/03/2022 and CT abdomen from 04/25/2022 FINDINGS: Lower chest: Bibasilar pulmonary nodules are observed. Pleural-based right middle lobe nodule measures 1.4 by 0.8 cm on image 7 series 4, previously 1.1 by 0.7 cm on 08/03/2022. A right lower lobe nodule measures 0.9 by 0.8 cm on image 6 series 4, previously 0.7 by 0.6 cm. Other nodules appear roughly  similar to previous. Descending thoracic aortic and circumflex coronary artery atherosclerotic calcification. Hepatobiliary: Stable calcified liver lesions, not previously hypermetabolic. Cholecystectomy. No biliary dilatation. Pancreas: Unremarkable Spleen: Unremarkable Adrenals/Urinary Tract: Left nephrectomy. Adrenal glands and right kidney unremarkable in noncontrast CT appearance. There is gas in  the urinary bladder. Abnormal irregular wall thickening in the urinary bladder especially along the left inferior margin. This may reflect bladder tumor. Stomach/Bowel: Sigmoid colon diverticulosis. Vascular/Lymphatic: Atherosclerosis is present, including aortoiliac atherosclerotic disease. There is substantial atheromatous calcification at the origin of the celiac trunk and SMA and tracking within the proximal SMA. Vascular patency not assessed. Previously hypermetabolic left posterior periaortic adenopathy 1.1 cm in short axis on image 36 series 2, stable. Reproductive: Marked prostatomegaly, with slightly hazy margins potentially due to local therapy or prostatitis. Other: Broad left Spigelian hernia contains small bowel, image 52 series 2, no strangulation or obstruction. Musculoskeletal: Degenerative bilateral hip arthropathy. Grade 1 anterolisthesis of L4 on L5 with left pars defect at L4. In conjunction with lumbar spondylosis and degenerative disc disease there is bilateral foraminal impingement at the L4-5 and L5-S1 levels. Small supraumbilical hernia contains adipose tissue on image 68 series 6. IMPRESSION: 1. Abnormal irregular wall thickening in the urinary bladder especially along the left inferior margin, compatible with bladder tumor. There is gas in the urinary bladder which could be from recent instrumentation or from gas-forming organism. 2. Bibasilar pulmonary nodules, some of which have mildly enlarged compared to 08/03/2022, previously hypermetabolic, compatible with metastatic disease. 3. Stable 1.1 cm left posterior periaortic lymph node, previously hypermetabolic. 4. Marked prostatomegaly, with slightly hazy margins potentially due to local therapy or prostatitis. 5. Broad left Spigelian hernia contains small bowel, no strangulation or obstruction. 6. Lower lumbar impingement. 7. Sigmoid colon diverticulosis. 8. Marked atheromatous calcification at the origin of the celiac trunk and SMA.  Vascular patency not assessed on today's noncontrast exam. 9. Small supraumbilical hernia contains adipose tissue. 10. Degenerative bilateral hip arthropathy. Grade 1 anterolisthesis of L4 on L5 with left pars defect at L4. In conjunction with lumbar spondylosis and degenerative disc disease, there is bilateral foraminal impingement at L4-5 and L5-S1. 11. Stable calcified liver lesions, not previously hypermetabolic. 12. Aortic atherosclerosis. Aortic Atherosclerosis (ICD10-I70.0). Electronically Signed   By: Van Clines M.D.   On: 09/20/2022 18:12     ASSESSMENT:  Neutrophilic leukocytosis and microcytic anemia: - Patient seen at the request of Dr. Macarthur Critchley for evaluation of leukocytosis and anemia. - CBCD (10/31/2021): WBC-15.4 (N 68%, L 16%, M 5%, E 8%, B 1%) Hb-10.3, MCV-61, PLT-452 - BCR/ABL PCR: Negative on 10/31/2021 - CBCD (09/14/2021): WBC-14.6 (elevated ANC and AEC), Hb-10.5, MCV 62, PLT-384 - CBCD (08/29/2021): WBC-13, Hb-11, MCV-65, PLT-395 (elevated ANC and AEC-0.5 - He reports that he has been anemic all his life.  No prior history of blood transfusion.  No fevers or night sweats. - 22 pound weight loss since at least summer 2022.  Reports decreased appetite.  Reports bloating after eating. - In January 2023, he was hospitalized in [place] for bowel obstruction and bladder infection.  Nonsurgical management.  He received IV iron x1. - He had colonoscopy x3, last one in the 5 years negative.    Social/family history: - He lives at home with his wife.  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  Quit smoking 38 years ago. - No family history of leukemia.  Brother had lung cancer.  Maternal grandmother had  cancer.  3.  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 - Left nephroureterectomy and lymph node biopsy on 05/30/2022 by Dr. Gilford Rile. - Pathology shows high-grade papillary urothelial carcinoma of the renal pelvis, 7.5 cm, tumor invades through muscularis into the renal parenchyma and peripelvic fat (PT3).  Urothelial carcinoma is identified at the proximal and mid left ureter vascular, ureteral and all margins of resection are negative for tumor.  Metastatic urothelial carcinoma in 1/2 lymph nodes.  Left para-aortic resection shows 0/7 lymph nodes involved. - PET scan (08/03/2022): Hypermetabolic left supraclavicular lymph node 13 mm, SUV 3.8.  13 mm prevascular lymph node, SUV three 5.5.  High right paratracheal lymph node 12 mm, SUV 5.0.  I left paratracheal lymph node 10 mm, SUV 4.1.  Right middle lobe nodule 12 mm.  Right lower lobe nodule 7 mm.  7 mm left lung base nodule.  Right middle lobe largest nodule with SUV 2.9.  New hypermetabolic nodal tissue left aorta SUV 5.1. - Cycle 1 of enfortumab and Keytruda on 08/17/2022 - [facility] NGS: MSI-stable, TMB-low, BRAF pathogenic variant exon 15 (non V600), TSC1 pathogenic variant exon 17.  HER2 by IHC 0.  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 - Reported nausea and decreased eating and drinking.  He also had urinary infection for which she was started on Bactrim on Monday.  He felt nausea was worse and went to the ER yesterday and received IV fluids.  Compazine was changed to Reglan which seems to help better. - Today we will give him Zofran 8 mg IV.  Will give him 1 L of IV fluids with electrolytes. - Will start him on Zofran 8 mg every 8 hours as needed. - RTC next week for follow-up.   2.  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  3.   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 - Last Feraheme on 08/24/2022.  Hemoglobin today is 9.2.  4.  Chronic back pain: - Continue oxycodone 10 mg every 8 hours which is helping.  5.  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  Pathology consistent with noninvasive high-grade papillary urothelial carcinoma. - Continue follow-up with Dr. Alyson Ingles.  6.  Skin rash: - He has erythematous maculopapular rash on forearms and left arm.  It is not there on the trunk or legs. - He is using hydrocortisone once daily. - He was told to use hydrocortisone twice daily.  If no improvement, will send betamethasone propionate.  Orders placed this encounter:  No orders of the defined types were placed in this encounter.     Derek Jack, MD [facility] [phone]

## 2022-09-22 ENCOUNTER — Encounter (HOSPITAL_COMMUNITY): Payer: Self-pay | Admitting: Hematology

## 2022-09-22 ENCOUNTER — Encounter: Payer: Self-pay | Admitting: Hematology

## 2022-09-23 LAB — URINE CULTURE

## 2022-09-25 ENCOUNTER — Inpatient Hospital Stay: Payer: MEDICARE

## 2022-09-25 ENCOUNTER — Other Ambulatory Visit: Payer: Self-pay | Admitting: *Deleted

## 2022-09-25 ENCOUNTER — Ambulatory Visit (INDEPENDENT_AMBULATORY_CARE_PROVIDER_SITE_OTHER): Payer: MEDICARE | Admitting: Urology

## 2022-09-25 VITALS — BP 120/59 | HR 90 | Temp 97.6°F | Resp 20

## 2022-09-25 DIAGNOSIS — Z5112 Encounter for antineoplastic immunotherapy: Secondary | ICD-10-CM | POA: Diagnosis not present

## 2022-09-25 DIAGNOSIS — R110 Nausea: Secondary | ICD-10-CM

## 2022-09-25 DIAGNOSIS — D649 Anemia, unspecified: Secondary | ICD-10-CM

## 2022-09-25 DIAGNOSIS — C678 Malignant neoplasm of overlapping sites of bladder: Secondary | ICD-10-CM

## 2022-09-25 DIAGNOSIS — R82998 Other abnormal findings in urine: Secondary | ICD-10-CM

## 2022-09-25 LAB — URINALYSIS, ROUTINE W REFLEX MICROSCOPIC
Bilirubin, UA: NEGATIVE
Glucose, UA: NEGATIVE
Ketones, UA: NEGATIVE
Nitrite, UA: POSITIVE — AB
Specific Gravity, UA: 1.015 (ref 1.005–1.030)
Urobilinogen, Ur: 0.2 mg/dL (ref 0.2–1.0)
pH, UA: 8.5 — ABNORMAL HIGH (ref 5.0–7.5)

## 2022-09-25 LAB — MICROSCOPIC EXAMINATION: WBC, UA: >30 /hpf — AB (ref 0–5)

## 2022-09-25 MED ORDER — LEVOFLOXACIN 500 MG PO TABS: 500.0000 mg | ORAL_TABLET | Freq: Every day | ORAL | 0 refills | Status: DC

## 2022-09-25 MED ORDER — POTASSIUM CHLORIDE IN NACL 20-0.9 MEQ/L-% IV SOLN
Freq: Once | INTRAVENOUS | Status: AC
  Filled 2022-09-25: qty 1000

## 2022-09-25 MED ORDER — MAGNESIUM SULFATE 2 GM/50ML IV SOLN
2.0000 g | Freq: Once | INTRAVENOUS | Status: AC
  Administered 2022-09-25: 2 g via INTRAVENOUS
  Filled 2022-09-25: qty 50

## 2022-09-25 MED ORDER — HEPARIN SOD (PORK) LOCK FLUSH 100 UNIT/ML IV SOLN
500.0000 [IU] | Freq: Once | INTRAVENOUS | Status: AC | PRN
  Administered 2022-09-25: 500 [IU]

## 2022-09-25 MED ORDER — PROMETHAZINE HCL 25 MG RE SUPP: 25.0000 mg | Freq: Four times a day (QID) | RECTAL | 2 refills | Status: DC | PRN

## 2022-09-25 MED ORDER — ONDANSETRON HCL 4 MG/2ML IJ SOLN
4.0000 mg | Freq: Two times a day (BID) | INTRAMUSCULAR | Status: AC
  Administered 2022-09-25 (×2): 4 mg via INTRAVENOUS
  Filled 2022-09-25: qty 2

## 2022-09-25 MED ORDER — SODIUM CHLORIDE 0.9% FLUSH
10.0000 mL | Freq: Once | INTRAVENOUS | Status: AC | PRN
  Administered 2022-09-25: 10 mL

## 2022-09-25 NOTE — Patient Instructions (Signed)
[facility]  Discharge Instructions: Thank you for choosing [facility] to provide your oncology and hematology care.  If you have a lab appointment with the [facility], please come in thru the Main Entrance and check in at the main information desk.  Wear comfortable clothing and clothing appropriate for easy access to any Portacath or PICC line.   We strive to give you quality time with your provider. You may need to reschedule your appointment if you arrive late (15 or more minutes).  Arriving late affects you and other patients whose appointments are after yours.  Also, if you miss three or more appointments without notifying the office, you may be dismissed from the clinic at the provider's discretion.      For prescription refill requests, have your pharmacy contact our office and allow 72 hours for refills to be completed.    Today you received hydration fluids and nausea medications   To help prevent nausea and vomiting after your treatment, we encourage you to take your nausea medication as directed.  BELOW ARE SYMPTOMS THAT SHOULD BE REPORTED IMMEDIATELY: *FEVER GREATER THAN 100.4 F (38 C) OR HIGHER *CHILLS OR SWEATING *NAUSEA AND VOMITING THAT IS NOT CONTROLLED WITH YOUR NAUSEA MEDICATION *UNUSUAL SHORTNESS OF BREATH *UNUSUAL BRUISING OR BLEEDING *URINARY PROBLEMS (pain or burning when urinating, or frequent urination) *BOWEL PROBLEMS (unusual diarrhea, constipation, pain near the anus) TENDERNESS IN MOUTH AND THROAT WITH OR WITHOUT PRESENCE OF ULCERS (sore throat, sores in mouth, or a toothache) UNUSUAL RASH, SWELLING OR PAIN  UNUSUAL VAGINAL DISCHARGE OR ITCHING   Items with * indicate a potential emergency and should be followed up as soon as possible or go to the Emergency Department if any problems should occur.  Please show the CHEMOTHERAPY ALERT CARD or IMMUNOTHERAPY ALERT CARD at check-in to the Emergency Department and triage  nurse.  Should you have questions after your visit or need to cancel or reschedule your appointment, please contact [facility] [phone]  and follow the prompts.  Office hours are 8:00 a.m. to 4:30 p.m. Monday - Friday. Please note that voicemails left after 4:00 p.m. may not be returned until the following business day.  We are closed weekends and major holidays. You have access to a nurse at all times for urgent questions. Please call the main number to the clinic [phone] and follow the prompts.  For any non-urgent questions, you may also contact your provider using MyChart. We now offer e-Visits for anyone 7 and older to request care online for non-urgent symptoms. For details visit [email or web address].   Also download the MyChart app! Go to the app store, search "MyChart", open the app, select [facility], and log in with your MyChart username and password.  Masks are optional in the cancer centers. If you would like for your care team to wear a mask while they are taking care of you, please let them know. You may have one support person who is at least 80 years old accompany you for your appointments.

## 2022-09-25 NOTE — Progress Notes (Signed)
Patient in office today for NV. Patient called over the weekend with positive urine culture resistant to medications patient was prescribed. Patient has currently been taking Levaquin '750mg'$  x2 doses since Saturday.  Patient reports intense nausea at times and unable to eat or drink. Urinary symptoms still present consistent with UTI.   Reviewed UA and patient symptoms with Dr. Alyson Ingles, patient to continue 1 more daily dose of levaquin '750mg'$  and then will do 500 mg daily by mouth for 7 days. Patient also will have BMP today for kidney function.  I called and spoke with Ebony Hail at [facility] regarding patient symptoms and decrease po intake. Patient will go over today for IVF. Patient aware and voiced understanding.

## 2022-09-25 NOTE — Progress Notes (Signed)
Patient presented to clinic today for hydration fluids and emetics. MD notified and zofran '8mg'$  ordered and given per MD.   Patient tolerated it well without problems. Vitals stable and discharged home from clinic ambulatory. Follow up as scheduled.

## 2022-09-26 ENCOUNTER — Encounter: Payer: Self-pay | Admitting: Hematology

## 2022-09-26 LAB — BASIC METABOLIC PANEL
BUN/Creatinine Ratio: 11 (ref 10–24)
BUN: 20 mg/dL (ref 8–27)
CO2: 22 mmol/L (ref 20–29)
Calcium: 9.8 mg/dL (ref 8.6–10.2)
Chloride: 96 mmol/L (ref 96–106)
Creatinine, Ser: 1.76 mg/dL — ABNORMAL HIGH (ref 0.76–1.27)
Glucose: 140 mg/dL — ABNORMAL HIGH (ref 70–99)
Potassium: 5.6 mmol/L — ABNORMAL HIGH (ref 3.5–5.2)
Sodium: 135 mmol/L (ref 134–144)
eGFR: 39 mL/min/{1.73_m2} — ABNORMAL LOW (ref >59)

## 2022-09-28 ENCOUNTER — Inpatient Hospital Stay: Payer: MEDICARE

## 2022-09-28 ENCOUNTER — Inpatient Hospital Stay (HOSPITAL_BASED_OUTPATIENT_CLINIC_OR_DEPARTMENT_OTHER): Payer: MEDICARE | Admitting: Hematology

## 2022-09-28 ENCOUNTER — Other Ambulatory Visit: Payer: Self-pay | Admitting: *Deleted

## 2022-09-28 VITALS — BP 126/65 | HR 96 | Temp 98.2°F | Resp 18

## 2022-09-28 DIAGNOSIS — R110 Nausea: Secondary | ICD-10-CM

## 2022-09-28 DIAGNOSIS — R1013 Epigastric pain: Secondary | ICD-10-CM | POA: Diagnosis not present

## 2022-09-28 DIAGNOSIS — C652 Malignant neoplasm of left renal pelvis: Secondary | ICD-10-CM | POA: Diagnosis not present

## 2022-09-28 DIAGNOSIS — D649 Anemia, unspecified: Secondary | ICD-10-CM

## 2022-09-28 DIAGNOSIS — Z95828 Presence of other vascular implants and grafts: Secondary | ICD-10-CM | POA: Diagnosis not present

## 2022-09-28 DIAGNOSIS — K5651 Intestinal adhesions [bands], with partial obstruction: Secondary | ICD-10-CM | POA: Diagnosis not present

## 2022-09-28 LAB — COMPREHENSIVE METABOLIC PANEL
ALT: 14 U/L (ref 0–44)
AST: 18 U/L (ref 15–41)
Albumin: 3.5 g/dL (ref 3.5–5.0)
Alkaline Phosphatase: 74 U/L (ref 38–126)
Anion gap: 11 (ref 5–15)
BUN: 32 mg/dL — ABNORMAL HIGH (ref 8–23)
CO2: 26 mmol/L (ref 22–32)
Calcium: 9.1 mg/dL (ref 8.9–10.3)
Chloride: 101 mmol/L (ref 98–111)
Creatinine, Ser: 1.65 mg/dL — ABNORMAL HIGH (ref 0.61–1.24)
GFR, Estimated: 42 mL/min — ABNORMAL LOW (ref >60)
Glucose, Bld: 141 mg/dL — ABNORMAL HIGH (ref 70–99)
Potassium: 4.9 mmol/L (ref 3.5–5.1)
Sodium: 138 mmol/L (ref 135–145)
Total Bilirubin: 0.5 mg/dL (ref 0.3–1.2)
Total Protein: 7 g/dL (ref 6.5–8.1)

## 2022-09-28 LAB — CBC WITH DIFFERENTIAL/PLATELET
Abs Immature Granulocytes: 0.22 10*3/uL — ABNORMAL HIGH (ref 0.00–0.07)
Basophils Absolute: 0.1 10*3/uL (ref 0.0–0.1)
Basophils Relative: 1 %
Eosinophils Absolute: 0.4 10*3/uL (ref 0.0–0.5)
Eosinophils Relative: 4 %
HCT: 28 % — ABNORMAL LOW (ref 39.0–52.0)
Hemoglobin: 8.7 g/dL — ABNORMAL LOW (ref 13.0–17.0)
Immature Granulocytes: 2 %
Lymphocytes Relative: 11 %
Lymphs Abs: 1.2 10*3/uL (ref 0.7–4.0)
MCH: 19.9 pg — ABNORMAL LOW (ref 26.0–34.0)
MCHC: 31.1 g/dL (ref 30.0–36.0)
MCV: 64.1 fL — ABNORMAL LOW (ref 80.0–100.0)
Monocytes Absolute: 1.1 10*3/uL — ABNORMAL HIGH (ref 0.1–1.0)
Monocytes Relative: 10 %
Neutro Abs: 8.2 10*3/uL — ABNORMAL HIGH (ref 1.7–7.7)
Neutrophils Relative %: 72 %
Platelets: 500 10*3/uL — ABNORMAL HIGH (ref 150–400)
RBC: 4.37 MIL/uL (ref 4.22–5.81)
RDW: 18.3 % — ABNORMAL HIGH (ref 11.5–15.5)
WBC: 11.3 10*3/uL — ABNORMAL HIGH (ref 4.0–10.5)
nRBC: 0 % (ref 0.0–0.2)

## 2022-09-28 LAB — IRON AND TIBC
Iron: 47 ug/dL (ref 45–182)
Saturation Ratios: 20 % (ref 17.9–39.5)
TIBC: 240 ug/dL — ABNORMAL LOW (ref 250–450)
UIBC: 193 ug/dL

## 2022-09-28 LAB — FERRITIN: Ferritin: 429 ng/mL — ABNORMAL HIGH (ref 24–336)

## 2022-09-28 LAB — MAGNESIUM: Magnesium: 1.7 mg/dL (ref 1.7–2.4)

## 2022-09-28 LAB — PHOSPHORUS: Phosphorus: 3.9 mg/dL (ref 2.5–4.6)

## 2022-09-28 MED ORDER — SODIUM CHLORIDE 0.9 % IV SOLN: Freq: Once | INTRAVENOUS | Status: AC

## 2022-09-28 MED ORDER — OLANZAPINE 5 MG PO TABS: 5.0000 mg | ORAL_TABLET | Freq: Every day | ORAL | 1 refills | Status: DC

## 2022-09-28 MED ORDER — SODIUM CHLORIDE 0.9 % IV SOLN: 8.0000 mg | Freq: Once | INTRAVENOUS | Status: DC

## 2022-09-28 MED ORDER — HEPARIN SOD (PORK) LOCK FLUSH 100 UNIT/ML IV SOLN
500.0000 [IU] | Freq: Once | INTRAVENOUS | Status: AC | PRN
  Administered 2022-09-28: 500 [IU]

## 2022-09-28 MED ORDER — SODIUM CHLORIDE 0.9% FLUSH
10.0000 mL | Freq: Once | INTRAVENOUS | Status: AC | PRN
  Administered 2022-09-28: 10 mL

## 2022-09-28 MED ORDER — SODIUM CHLORIDE 0.9 % IV SOLN
510.0000 mg | Freq: Once | INTRAVENOUS | Status: AC
  Administered 2022-09-28: 510 mg via INTRAVENOUS
  Filled 2022-09-28: qty 17

## 2022-09-28 MED ORDER — SODIUM CHLORIDE 0.9% FLUSH
10.0000 mL | Freq: Once | INTRAVENOUS | Status: AC
  Administered 2022-09-28: 10 mL via INTRAVENOUS

## 2022-09-28 MED ORDER — PREDNISONE 10 MG PO TABS: 10.0000 mg | ORAL_TABLET | Freq: Every day | ORAL | 0 refills | Status: DC

## 2022-09-28 MED ORDER — OXYCODONE HCL 10 MG PO TABS: 10.0000 mg | ORAL_TABLET | Freq: Three times a day (TID) | ORAL | 0 refills | Status: DC | PRN

## 2022-09-28 MED ORDER — ONDANSETRON HCL 4 MG/2ML IJ SOLN
8.0000 mg | Freq: Once | INTRAMUSCULAR | Status: AC
  Administered 2022-09-28: 8 mg via INTRAVENOUS
  Filled 2022-09-28: qty 4

## 2022-09-28 NOTE — Progress Notes (Signed)
[place], 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 male, seen for follow-up of metastatic high-grade urothelial carcinoma and toxicity assessment prior to cycle 3 of treatment.  Reports energy levels of 40%.  He was doing well until this morning when he did suddenly developed nausea.  He also developed stiffness and pains in the knees and shoulders particularly in the mornings.  Phenergan suppositories are helping with nausea.  He is continuing Levaquin for urinary infection and has 3 more days left.  Also complains of difficulty with sleep.  REVIEW OF SYSTEMS:  Review of Systems  Gastrointestinal:  Positive for constipation and nausea.  Genitourinary:  Positive for difficulty urinating.   Musculoskeletal:  Positive for back pain.  Neurological:  Positive for dizziness and numbness (Hands and feet).  Psychiatric/Behavioral:  Positive for sleep disturbance.   All other systems reviewed and are negative.   PAST MEDICAL/SURGICAL HISTORY:  Past Medical History:  Diagnosis Date   Anemia    Anxiety    Arthritis    Bladder tumor    Cancer ([facility])    hx of skin cancer, renal cancer   Diabetes mellitus without complication (HCC)    GERD (gastroesophageal reflux disease)    Heart murmur    as a child   History of kidney stones    Port-A-Cath in place 08/17/2022   Sleep apnea    cpap   Past Surgical History:  Procedure Laterality Date   bilateral dupreyen contracture surgery      CHOLECYSTECTOMY      CYSTOSCOPY N/A 05/30/2022   Procedure: CYSTOSCOPY WITH TRANSURETHRAL RESECTION OF THE LEFT URETERAL ORIFICE,  TRANSURETHRAL RESECTION OF BLADDER TUMOR;  Surgeon: Ceasar Mons, MD;  Location: [facility];  Service: Urology;  Laterality: N/A;   CYSTOSCOPY N/A 09/06/2022   Procedure: CYSTOSCOPY;  Surgeon: Cleon Gustin, MD;  Location: [facility];  Service: Urology;  Laterality: N/A;   CYSTOSCOPY WITH RETROGRADE PYELOGRAM, URETEROSCOPY AND STENT PLACEMENT Left 04/26/2022   Procedure: CYSTOSCOPY WITH RETROGRADE PYELOGRAM, URETEROSCOPY AND STENT PLACEMENT;  Surgeon: Cleon Gustin, MD;  Location: [facility];  Service: Urology;  Laterality: Left;   IR IMAGING GUIDED PORT INSERTION  08/16/2022   LIVER BIOPSY     ROBOT ASSITED LAPAROSCOPIC NEPHROURETERECTOMY Left 05/30/2022   Procedure: XI ROBOT ASSITED LAPAROSCOPIC NEPHROURETERECTOMY;  Surgeon: Ceasar Mons, MD;  Location: [facility];  Service: Urology;  Laterality: Left;   TRANSURETHRAL RESECTION OF BLADDER TUMOR  04/26/2022   Procedure: TRANSURETHRAL RESECTION OF BLADDER TUMOR (TURBT);  Surgeon: Cleon Gustin, MD;  Location: [facility];  Service: Urology;;   TRANSURETHRAL RESECTION OF BLADDER TUMOR N/A 09/06/2022   Procedure: TRANSURETHRAL RESECTION OF BLADDER TUMOR (TURBT);  Surgeon: Cleon Gustin, MD;  Location: [facility];  Service: Urology;  Laterality: N/A;    SOCIAL HISTORY:  Social History   Socioeconomic History   Marital status: Married    Spouse name: Not on file   Number of children: Not on file   Years of education: Not on file   Highest education level:  Not on file  Occupational History   Not on file  Tobacco Use   Smoking status: Former    Types: Cigarettes   Smokeless tobacco: Never  Vaping Use   Vaping Use: Never used  Substance and Sexual Activity   Alcohol use: Never   Drug use: Never   Sexual activity: Not Currently  Other Topics Concern   Not on file  Social History Narrative   Not on file    Social Determinants of Health   Financial Resource Strain: Not on file  Food Insecurity: Not on file  Transportation Needs: Not on file  Physical Activity: Not on file  Stress: Not on file  Social Connections: Not on file  Intimate Partner Violence: Not on file    FAMILY HISTORY:  No family history on file.  CURRENT MEDICATIONS:  Current Outpatient Medications  Medication Sig Dispense Refill   citalopram (CELEXA) 20 MG tablet Take 20 mg by mouth daily.     docusate sodium (COLACE) 100 MG capsule Take 1 capsule (100 mg total) by mouth 2 (two) times daily as needed for mild constipation. 30 capsule 0   Enfortumab Vedotin-ejfv (PADCEV IV) Inject into the vein once a week. Days 1 and 8 every 21 days     fluorouracil (EFUDEX) 5 % cream Apply 1 Application topically 2 (two) times daily as needed (cancer spots).     latanoprost (XALATAN) 0.005 % ophthalmic solution Place 1 drop into both eyes at bedtime.     levofloxacin (LEVAQUIN) 500 MG tablet Take 1 tablet (500 mg total) by mouth daily. 7 tablet 0   metFORMIN (GLUCOPHAGE) 500 MG tablet Take by mouth 2 (two) times daily with a meal.     metoCLOPramide (REGLAN) 10 MG tablet Take 1 tablet (10 mg total) by mouth every 6 (six) hours as needed for nausea or vomiting. 30 tablet 0   OLANZapine (ZYPREXA) 5 MG tablet Take 1 tablet (5 mg total) by mouth at bedtime. 30 tablet 1   omeprazole (PRILOSEC) 20 MG capsule Take 20 mg by mouth 2 (two) times daily.     PEMBROLIZUMAB IV Inject into the vein every 21 ( twenty-one) days.     predniSONE (DELTASONE) 10 MG tablet Take 1 tablet (10 mg total) by mouth daily with breakfast. 30 tablet 0   simvastatin (ZOCOR) 40 MG tablet Take 40 mg by mouth daily.     tamsulosin (FLOMAX) 0.4 MG CAPS capsule Take 0.4 mg by mouth at bedtime.     timolol (BETIMOL) 0.5 % ophthalmic solution Place 1 drop into both eyes daily.     tizanidine (ZANAFLEX) 2 MG capsule Take 2 mg by mouth at bedtime.     lidocaine-prilocaine  (EMLA) cream Apply a small amount to port a cath site and cover with plastic wrap 1 hour prior to infusion (Patient not taking: Reported on 09/28/2022) 30 g 3   ondansetron (ZOFRAN-ODT) 8 MG disintegrating tablet Take 1 tablet (8 mg total) by mouth every 8 (eight) hours as needed for nausea or vomiting. (Patient not taking: Reported on 09/28/2022) 60 tablet 3   Oxycodone HCl 10 MG TABS Take 1 tablet (10 mg total) by mouth every 8 (eight) hours as needed. 60 tablet 0   prochlorperazine (COMPAZINE) 10 MG tablet Take 1 tablet (10 mg total) by mouth every 6 (six) hours as needed for nausea or vomiting. (Patient not taking: Reported on 09/28/2022) 60 tablet 3   promethazine (PHENERGAN) 25 MG suppository Place 1 suppository (25 mg total)  rectally every 6 (six) hours as needed for nausea or vomiting. (Patient not taking: Reported on 09/28/2022) 30 each 2   No current facility-administered medications for this visit.    ALLERGIES:  No Known Allergies  PHYSICAL EXAM:  Performance status (ECOG): 1 - Symptomatic but completely ambulatory  Vitals:   09/28/22 1117  BP: 112/67  Pulse: 96  Resp: 16  Temp: 98.4 F (36.9 C)  SpO2: 100%    Wt Readings from Last 3 Encounters:  09/28/22 166 lb 4.8 oz (75.4 kg)  09/21/22 166 lb 3.2 oz (75.4 kg)  09/14/22 172 lb 12.8 oz (78.4 kg)   Physical Exam Vitals reviewed.  Constitutional:      Appearance: Normal appearance.  Cardiovascular:     Rate and Rhythm: Normal rate and regular rhythm.     Pulses: Normal pulses.     Heart sounds: Normal heart sounds.  Pulmonary:     Effort: Pulmonary effort is normal.     Breath sounds: Normal breath sounds.  Neurological:     General: No focal deficit present.     Mental Status: He is alert and oriented to person, place, and time.  Psychiatric:        Mood and Affect: Mood normal.        Behavior: Behavior normal.     LABORATORY DATA:  I have reviewed the labs as listed.     Latest Ref Rng & Units  09/28/2022   10:04 AM 09/21/2022    8:01 AM 09/20/2022    1:03 PM  CBC  WBC 4.0 - 10.5 K/uL 11.3  10.5  10.4   Hemoglobin 13.0 - 17.0 g/dL 8.7  9.2  9.6   Hematocrit 39.0 - 52.0 % 28.0  29.7  30.9   Platelets 150 - 400 K/uL 500  432  424       Latest Ref Rng & Units 09/28/2022   10:04 AM 09/25/2022   10:22 AM 09/21/2022    8:01 AM  CMP  Glucose 70 - 99 mg/dL 141  140  156   BUN 8 - 23 mg/dL 32  20  24   Creatinine 0.61 - 1.24 mg/dL 1.65  1.76  1.75   Sodium 135 - 145 mmol/L 138  135  133   Potassium 3.5 - 5.1 mmol/L 4.9  5.6  4.4   Chloride 98 - 111 mmol/L 101  96  100   CO2 22 - 32 mmol/L _0 Calcium 8.9 - 10.3 mg/dL 9.1  9.8  9.1   Total Protein 6.5 - 8.1 g/dL 7.0   7.4   Total Bilirubin 0.3 - 1.2 mg/dL 0.5   0.3   Alkaline Phos 38 - 126 U/L 74   73   AST 15 - 41 U/L 18   26   ALT 0 - 44 U/L 14   18       Component Value Date/Time   RBC 4.37 09/28/2022 1004   MCV 64.1 (L) 09/28/2022 1004   MCH 19.9 (L) 09/28/2022 1004   MCHC 31.1 09/28/2022 1004   RDW 18.3 (H) 09/28/2022 1004   LYMPHSABS 1.2 09/28/2022 1004   MONOABS 1.1 (H) 09/28/2022 1004   EOSABS 0.4 09/28/2022 1004   BASOSABS 0.1 09/28/2022 1004    DIAGNOSTIC IMAGING:  I have independently reviewed the scans and discussed with the patient. CT Renal Stone Study  Result Date: 09/20/2022 CLINICAL DATA:  Metastatic bladder cancer. History of renal cell carcinoma left  nephrectomy. Abdominal and flank pain with weakness and body aches for 2 weeks. Currently on antibiotic therapy for urinary tract infection. * Tracking Code: BO * EXAM: CT ABDOMEN AND PELVIS WITHOUT CONTRAST TECHNIQUE: Multidetector CT imaging of the abdomen and pelvis was performed following the standard protocol without IV contrast. RADIATION DOSE REDUCTION: This exam was performed according to the departmental dose-optimization program which includes automated exposure control, adjustment of the mA and/or kV according to patient size and/or  use of iterative reconstruction technique. COMPARISON:  Multiple exams, including PET-CT 08/03/2022 and CT abdomen from 04/25/2022 FINDINGS: Lower chest: Bibasilar pulmonary nodules are observed. Pleural-based right middle lobe nodule measures 1.4 by 0.8 cm on image 7 series 4, previously 1.1 by 0.7 cm on 08/03/2022. A right lower lobe nodule measures 0.9 by 0.8 cm on image 6 series 4, previously 0.7 by 0.6 cm. Other nodules appear roughly similar to previous. Descending thoracic aortic and circumflex coronary artery atherosclerotic calcification. Hepatobiliary: Stable calcified liver lesions, not previously hypermetabolic. Cholecystectomy. No biliary dilatation. Pancreas: Unremarkable Spleen: Unremarkable Adrenals/Urinary Tract: Left nephrectomy. Adrenal glands and right kidney unremarkable in noncontrast CT appearance. There is gas in the urinary bladder. Abnormal irregular wall thickening in the urinary bladder especially along the left inferior margin. This may reflect bladder tumor. Stomach/Bowel: Sigmoid colon diverticulosis. Vascular/Lymphatic: Atherosclerosis is present, including aortoiliac atherosclerotic disease. There is substantial atheromatous calcification at the origin of the celiac trunk and SMA and tracking within the proximal SMA. Vascular patency not assessed. Previously hypermetabolic left posterior periaortic adenopathy 1.1 cm in short axis on image 36 series 2, stable. Reproductive: Marked prostatomegaly, with slightly hazy margins potentially due to local therapy or prostatitis. Other: Broad left Spigelian hernia contains small bowel, image 52 series 2, no strangulation or obstruction. Musculoskeletal: Degenerative bilateral hip arthropathy. Grade 1 anterolisthesis of L4 on L5 with left pars defect at L4. In conjunction with lumbar spondylosis and degenerative disc disease there is bilateral foraminal impingement at the L4-5 and L5-S1 levels. Small supraumbilical hernia contains adipose  tissue on image 68 series 6. IMPRESSION: 1. Abnormal irregular wall thickening in the urinary bladder especially along the left inferior margin, compatible with bladder tumor. There is gas in the urinary bladder which could be from recent instrumentation or from gas-forming organism. 2. Bibasilar pulmonary nodules, some of which have mildly enlarged compared to 08/03/2022, previously hypermetabolic, compatible with metastatic disease. 3. Stable 1.1 cm left posterior periaortic lymph node, previously hypermetabolic. 4. Marked prostatomegaly, with slightly hazy margins potentially due to local therapy or prostatitis. 5. Broad left Spigelian hernia contains small bowel, no strangulation or obstruction. 6. Lower lumbar impingement. 7. Sigmoid colon diverticulosis. 8. Marked atheromatous calcification at the origin of the celiac trunk and SMA. Vascular patency not assessed on today's noncontrast exam. 9. Small supraumbilical hernia contains adipose tissue. 10. Degenerative bilateral hip arthropathy. Grade 1 anterolisthesis of L4 on L5 with left pars defect at L4. In conjunction with lumbar spondylosis and degenerative disc disease, there is bilateral foraminal impingement at L4-5 and L5-S1. 11. Stable calcified liver lesions, not previously hypermetabolic. 12. Aortic atherosclerosis. Aortic Atherosclerosis (ICD10-I70.0). Electronically Signed   By: Van Clines M.D.   On: 09/20/2022 18:12     ASSESSMENT:  Neutrophilic leukocytosis and microcytic anemia: - Patient seen at the request of Dr. Macarthur Critchley for evaluation of leukocytosis and anemia. - CBCD (10/31/2021): WBC-15.4 (N 68%, L 16%, M 5%, E 8%, B 1%) Hb-10.3, MCV-61, PLT-452 - BCR/ABL PCR: Negative on 10/31/2021 - CBCD (09/14/2021): WBC-14.6 (elevated  [place] and AEC), Hb-10.5, MCV 62, PLT-384 - CBCD (08/29/2021): WBC-13, Hb-11, MCV-65, PLT-395 (elevated ANC and AEC-0.5 - He reports that he has been anemic all his life.  No prior history of blood transfusion.   No fevers or night sweats. - 22 pound weight loss since at least summer 2022.  Reports decreased appetite.  Reports bloating after eating. - In January 2023, he was hospitalized in [place] for bowel obstruction and bladder infection.  Nonsurgical management.  He received IV iron x1. - He had colonoscopy x3, last one in the 5 years negative.    Social/family history: - He lives at home with his wife.  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  Quit smoking 38 years ago. - No family history of leukemia.  Brother had lung cancer.  Maternal grandmother had cancer.  3.  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 - Left nephroureterectomy and lymph node biopsy on 05/30/2022 by Dr. Gilford Rile. - Pathology shows high-grade papillary urothelial carcinoma of the renal pelvis, 7.5 cm, tumor invades through muscularis into the renal parenchyma and peripelvic fat (PT3).  Urothelial carcinoma is identified at the proximal and mid left ureter vascular, ureteral and all margins of resection are negative for tumor.  Metastatic urothelial carcinoma in 1/2 lymph nodes.  Left para-aortic resection shows 0/7 lymph nodes involved. - PET scan (08/03/2022): Hypermetabolic left supraclavicular lymph node 13 mm, SUV 3.8.  13 mm prevascular lymph node, SUV three 5.5.  High right paratracheal lymph node 12 mm, SUV 5.0.  I left paratracheal lymph node 10 mm, SUV 4.1.  Right middle lobe nodule 12 mm.  Right lower lobe nodule 7 mm.  7 mm left lung base nodule.  Right middle lobe largest nodule with SUV 2.9.  New hypermetabolic nodal tissue left aorta SUV 5.1. - Cycle 1 of enfortumab and Keytruda on 08/17/2022 - [facility] NGS: MSI-stable, TMB-low, BRAF pathogenic variant exon 15 (non V600), TSC1 pathogenic  variant exon 17.  HER2 by IHC 0.  4.  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  Pathology consistent with noninvasive high-grade papillary urothelial carcinoma. - Continue follow-up with Dr. Alyson Ingles.  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 - Last week he had problems with nausea and UTI. - He is still taking Levaquin and has 3 more days left. - Today he complains of joint pains and stiffness in the knees and shoulders. - We will start him on prednisone 20 mg today and tomorrow followed by 10 mg daily.  If his joint symptoms improve, will consider starting treatment next week.  Today we will hold his cycle 3.   2.  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  3.  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 - Last Feraheme on 08/24/2022.  Hemoglobin today is 8.7 and ferritin is 427.  I have recommended Feraheme infusion today.  4.  Chronic back pain: - Continue oxycodone 10 mg every 8 hours as needed which is helping.  5.  Skin rash: - Erythematous maculopapular rash on the forearms on the left thumb has improved  with hydrocortisone twice daily.  6.  Nausea/decreased appetite/sleeplessness: - He is also using Phenergan suppositories.  Will start him on Zyprexa 5 mg at bedtime.  Orders placed this encounter:  No orders of the defined types were placed in this encounter.     Derek Jack, MD [place] [phone]

## 2022-09-28 NOTE — Patient Instructions (Addendum)
[place] at [facility] Discharge Instructions   You were seen and examined today by Dr. Delton Coombes.  He reviewed the results of your lab work. We will give you iron today for your hemoglobin of 8.7. All other results are normal/stable.  We will hold your treatment today since you are still taking antibiotics.   We sent a prescription to your pharmacy for prednisone. Take 2 pills today and 2 pills tomorrow and then take one pill a day.  You may use Zofran at home in addition to using the Phenergan suppositories as needed for nausea. DO NOT use Compazine with the Phenergan suppositories. We also sent Zyprexa to take at bedtime. This will help with nausea and hopefully with your insomnia.   Return as scheduled next week.    Thank you for choosing [place] at [facility] to provide your oncology and hematology care.  To afford each patient quality time with our provider, please arrive at least 15 minutes before your scheduled appointment time.   If you have a lab appointment with the [facility] please come in thru the Main Entrance and check in at the main information desk.  You need to re-schedule your appointment should you arrive 10 or more minutes late.  We strive to give you quality time with our providers, and arriving late affects you and other patients whose appointments are after yours.  Also, if you no show three or more times for appointments you may be dismissed from the clinic at the providers discretion.     Again, thank you for choosing [facility].  Our hope is that these requests will decrease the amount of time that you wait before being seen by our physicians.       _____________________________________________________________  Should you have questions after your visit to [facility], please contact our office at [phone] and follow the prompts.  Our office hours are 8:00 a.m. and 4:30 p.m.  Monday - Friday.  Please note that voicemails left after 4:00 p.m. may not be returned until the following business day.  We are closed weekends and major holidays.  You do have access to a nurse 24-7, just call the main number to the clinic [phone] and do not press any options, hold on the line and a nurse will answer the phone.    For prescription refill requests, have your pharmacy contact our office and allow 72 hours.    Due to Covid, you will need to wear a mask upon entering the hospital. If you do not have a mask, a mask will be given to you at the Main Entrance upon arrival. For doctor visits, patients may have 1 support person age 37 or older with them. For treatment visits, patients can not have anyone with them due to social distancing guidelines and our immunocompromised population.

## 2022-09-28 NOTE — Patient Instructions (Signed)
[facility]  Discharge Instructions: Thank you for choosing [facility] to provide your oncology and hematology care.  If you have a lab appointment with the Cancer Center, please come in thru the Main Entrance and check in at the main information desk.  Wear comfortable clothing and clothing appropriate for easy access to any Portacath or PICC line.   We strive to give you quality time with your provider. You may need to reschedule your appointment if you arrive late (15 or more minutes).  Arriving late affects you and other patients whose appointments are after yours.  Also, if you miss three or more appointments without notifying the office, you may be dismissed from the clinic at the provider's discretion.      For prescription refill requests, have your pharmacy contact our office and allow 72 hours for refills to be completed.    Today you received the following chemotherapy and/or immunotherapy agents Feraheme.  Ferumoxytol Injection What is this medication? FERUMOXYTOL (FER ue MOX i tol) treats low levels of iron in your body (iron deficiency anemia). Iron is a mineral that plays an important role in making red blood cells, which carry oxygen from your lungs to the rest of your body. This medicine may be used for other purposes; ask your health care provider or pharmacist if you have questions. COMMON BRAND NAME(S): Feraheme What should I tell my care team before I take this medication? They need to know if you have any of these conditions: Anemia not caused by low iron levels High levels of iron in the blood Magnetic resonance imaging (MRI) test scheduled An unusual or allergic reaction to iron, other medications, foods, dyes, or preservatives Pregnant or trying to get pregnant Breastfeeding How should I use this medication? This medication is injected into a vein. It is given by your care team in a hospital or clinic setting. Talk to your care  team the use of this medication in children. Special care may be needed. Overdosage: If you think you have taken too much of this medicine contact a poison control center or emergency room at once. NOTE: This medicine is only for you. Do not share this medicine with others. What if I miss a dose? It is important not to miss your dose. Call your care team if you are unable to keep an appointment. What may interact with this medication? Other iron products This list may not describe all possible interactions. Give your health care provider a list of all the medicines, herbs, non-prescription drugs, or dietary supplements you use. Also tell them if you smoke, drink alcohol, or use illegal drugs. Some items may interact with your medicine. What should I watch for while using this medication? Visit your care team regularly. Tell your care team if your symptoms do not start to get better or if they get worse. You may need blood work done while you are taking this medication. You may need to follow a special diet. Talk to your care team. Foods that contain iron include: whole grains/cereals, dried fruits, beans, or peas, leafy green vegetables, and organ meats (liver, kidney). What side effects may I notice from receiving this medication? Side effects that you should report to your care team as soon as possible: Allergic reactions--skin rash, itching, hives, swelling of the face, lips, tongue, or throat Low blood pressure--dizziness, feeling faint or lightheaded, blurry vision Shortness of breath Side effects that usually do not require medical attention (report to   your care team if they continue or are bothersome): Flushing Headache Joint pain Muscle pain Nausea Pain, redness, or irritation at injection site This list may not describe all possible side effects. Call your doctor for medical advice about side effects. You may report side effects to FDA at 1-800-FDA-1088. Where should I keep my  medication? This medication is given in a hospital or clinic and will not be stored at home. NOTE: This sheet is a summary. It may not cover all possible information. If you have questions about this medicine, talk to your doctor, pharmacist, or health care provider.  2023 Elsevier/Gold Standard (2021-02-16 00:00:00)       To help prevent nausea and vomiting after your treatment, we encourage you to take your nausea medication as directed.  BELOW ARE SYMPTOMS THAT SHOULD BE REPORTED IMMEDIATELY: *FEVER GREATER THAN 100.4 F (38 C) OR HIGHER *CHILLS OR SWEATING *NAUSEA AND VOMITING THAT IS NOT CONTROLLED WITH YOUR NAUSEA MEDICATION *UNUSUAL SHORTNESS OF BREATH *UNUSUAL BRUISING OR BLEEDING *URINARY PROBLEMS (pain or burning when urinating, or frequent urination) *BOWEL PROBLEMS (unusual diarrhea, constipation, pain near the anus) TENDERNESS IN MOUTH AND THROAT WITH OR WITHOUT PRESENCE OF ULCERS (sore throat, sores in mouth, or a toothache) UNUSUAL RASH, SWELLING OR PAIN  UNUSUAL VAGINAL DISCHARGE OR ITCHING   Items with * indicate a potential emergency and should be followed up as soon as possible or go to the Emergency Department if any problems should occur.  Please show the CHEMOTHERAPY ALERT CARD or IMMUNOTHERAPY ALERT CARD at check-in to the Emergency Department and triage nurse.  Should you have questions after your visit or need to cancel or reschedule your appointment, please contact [facility] [phone]  and follow the prompts.  Office hours are 8:00 a.m. to 4:30 p.m. Monday - Friday. Please note that voicemails left after 4:00 p.m. may not be returned until the following business day.  We are closed weekends and major holidays. You have access to a nurse at all times for urgent questions. Please call the main number to the clinic [phone] and follow the prompts.  For any non-urgent questions, you may also contact your provider using MyChart. We now  offer e-Visits for anyone 18 and older to request care online for non-urgent symptoms. For details visit [email or web address].   Also download the MyChart app! Go to the app store, search "MyChart", open the app, select [facility], and log in with your MyChart username and password.  Masks are optional in the cancer centers. If you would like for your care team to wear a mask while they are taking care of you, please let them know. You may have one support person who is at least 80 years old accompany you for your appointments.  

## 2022-09-28 NOTE — Progress Notes (Signed)
Patient presents today for chemotherapy infusion.  Patient is in satisfactory condition with no complaints voiced.  Vital signs are stable.  Labs reviewed by Dr. Delton Coombes during his office visit.  Creatinine is 1.65.  All other labs are within treatment parameters.    We will hold treatment today per Dr. Delton Coombes due to ongoing antibiotic therapy for a UTI. Patient will receive Feraheme 510 mg x one dose today and Zofran 8 mg IV x one   Patient tolerated infusion well with no complaints voiced.  Patient left ambulatory in stable condition.  Vital signs stable at discharge.  Follow up as scheduled.

## 2022-10-01 ENCOUNTER — Inpatient Hospital Stay (HOSPITAL_COMMUNITY): Payer: MEDICARE

## 2022-10-01 ENCOUNTER — Emergency Department (HOSPITAL_COMMUNITY): Payer: MEDICARE

## 2022-10-01 ENCOUNTER — Inpatient Hospital Stay (HOSPITAL_COMMUNITY)
Admission: EM | Admit: 2022-10-01 | Discharge: 2022-10-05 | DRG: 389 | Disposition: A | Discharge status: Home / Self Care | Payer: MEDICARE | Attending: Family Medicine | Admitting: Family Medicine

## 2022-10-01 ENCOUNTER — Other Ambulatory Visit: Payer: Self-pay

## 2022-10-01 DIAGNOSIS — C652 Malignant neoplasm of left renal pelvis: Secondary | ICD-10-CM | POA: Diagnosis present

## 2022-10-01 DIAGNOSIS — M542 Cervicalgia: Secondary | ICD-10-CM

## 2022-10-01 DIAGNOSIS — R3915 Urgency of urination: Secondary | ICD-10-CM | POA: Diagnosis present

## 2022-10-01 DIAGNOSIS — C778 Secondary and unspecified malignant neoplasm of lymph nodes of multiple regions: Secondary | ICD-10-CM | POA: Diagnosis present

## 2022-10-01 DIAGNOSIS — N1832 Chronic kidney disease, stage 3b: Secondary | ICD-10-CM | POA: Insufficient documentation

## 2022-10-01 DIAGNOSIS — K5651 Intestinal adhesions [bands], with partial obstruction: Secondary | ICD-10-CM | POA: Diagnosis present

## 2022-10-01 DIAGNOSIS — K56609 Unspecified intestinal obstruction, unspecified as to partial versus complete obstruction: Secondary | ICD-10-CM | POA: Diagnosis present

## 2022-10-01 DIAGNOSIS — Z7984 Long term (current) use of oral hypoglycemic drugs: Secondary | ICD-10-CM | POA: Diagnosis not present

## 2022-10-01 DIAGNOSIS — R339 Retention of urine, unspecified: Secondary | ICD-10-CM | POA: Diagnosis present

## 2022-10-01 DIAGNOSIS — R300 Dysuria: Secondary | ICD-10-CM

## 2022-10-01 DIAGNOSIS — Z85828 Personal history of other malignant neoplasm of skin: Secondary | ICD-10-CM | POA: Diagnosis not present

## 2022-10-01 DIAGNOSIS — N390 Urinary tract infection, site not specified: Secondary | ICD-10-CM | POA: Diagnosis present

## 2022-10-01 DIAGNOSIS — Z79899 Other long term (current) drug therapy: Secondary | ICD-10-CM

## 2022-10-01 DIAGNOSIS — E119 Type 2 diabetes mellitus without complications: Secondary | ICD-10-CM

## 2022-10-01 DIAGNOSIS — C679 Malignant neoplasm of bladder, unspecified: Secondary | ICD-10-CM | POA: Diagnosis present

## 2022-10-01 DIAGNOSIS — E782 Mixed hyperlipidemia: Secondary | ICD-10-CM | POA: Diagnosis present

## 2022-10-01 DIAGNOSIS — Z87891 Personal history of nicotine dependence: Secondary | ICD-10-CM | POA: Diagnosis not present

## 2022-10-01 DIAGNOSIS — C7802 Secondary malignant neoplasm of left lung: Secondary | ICD-10-CM | POA: Diagnosis present

## 2022-10-01 DIAGNOSIS — Z905 Acquired absence of kidney: Secondary | ICD-10-CM

## 2022-10-01 DIAGNOSIS — E1122 Type 2 diabetes mellitus with diabetic chronic kidney disease: Secondary | ICD-10-CM | POA: Diagnosis present

## 2022-10-01 DIAGNOSIS — E872 Acidosis, unspecified: Secondary | ICD-10-CM | POA: Diagnosis present

## 2022-10-01 DIAGNOSIS — Z1612 Extended spectrum beta lactamase (ESBL) resistance: Secondary | ICD-10-CM | POA: Diagnosis present

## 2022-10-01 DIAGNOSIS — G4730 Sleep apnea, unspecified: Secondary | ICD-10-CM | POA: Diagnosis present

## 2022-10-01 DIAGNOSIS — K219 Gastro-esophageal reflux disease without esophagitis: Secondary | ICD-10-CM | POA: Diagnosis present

## 2022-10-01 DIAGNOSIS — Z85528 Personal history of other malignant neoplasm of kidney: Secondary | ICD-10-CM | POA: Diagnosis not present

## 2022-10-01 DIAGNOSIS — R338 Other retention of urine: Secondary | ICD-10-CM

## 2022-10-01 DIAGNOSIS — C781 Secondary malignant neoplasm of mediastinum: Secondary | ICD-10-CM | POA: Diagnosis present

## 2022-10-01 DIAGNOSIS — G8929 Other chronic pain: Secondary | ICD-10-CM | POA: Diagnosis present

## 2022-10-01 DIAGNOSIS — R1013 Epigastric pain: Secondary | ICD-10-CM | POA: Diagnosis present

## 2022-10-01 LAB — CBC WITH DIFFERENTIAL/PLATELET
Abs Immature Granulocytes: 0.18 10*3/uL — ABNORMAL HIGH (ref 0.00–0.07)
Basophils Absolute: 0.1 10*3/uL (ref 0.0–0.1)
Basophils Relative: 1 %
Eosinophils Absolute: 0 10*3/uL (ref 0.0–0.5)
Eosinophils Relative: 0 %
HCT: 36.3 % — ABNORMAL LOW (ref 39.0–52.0)
Hemoglobin: 11.3 g/dL — ABNORMAL LOW (ref 13.0–17.0)
Immature Granulocytes: 1 %
Lymphocytes Relative: 5 %
Lymphs Abs: 1 10*3/uL (ref 0.7–4.0)
MCH: 19.8 pg — ABNORMAL LOW (ref 26.0–34.0)
MCHC: 31.1 g/dL (ref 30.0–36.0)
MCV: 63.5 fL — ABNORMAL LOW (ref 80.0–100.0)
Monocytes Absolute: 1.3 10*3/uL — ABNORMAL HIGH (ref 0.1–1.0)
Monocytes Relative: 7 %
Neutro Abs: 16.4 10*3/uL — ABNORMAL HIGH (ref 1.7–7.7)
Neutrophils Relative %: 86 %
Platelets: 547 10*3/uL — ABNORMAL HIGH (ref 150–400)
RBC: 5.72 MIL/uL (ref 4.22–5.81)
RDW: 19.1 % — ABNORMAL HIGH (ref 11.5–15.5)
WBC: 19 10*3/uL — ABNORMAL HIGH (ref 4.0–10.5)
nRBC: 0 % (ref 0.0–0.2)

## 2022-10-01 LAB — COMPREHENSIVE METABOLIC PANEL
ALT: 15 U/L (ref 0–44)
AST: 22 U/L (ref 15–41)
Albumin: 4.3 g/dL (ref 3.5–5.0)
Alkaline Phosphatase: 81 U/L (ref 38–126)
Anion gap: 12 (ref 5–15)
BUN: 37 mg/dL — ABNORMAL HIGH (ref 8–23)
CO2: 26 mmol/L (ref 22–32)
Calcium: 9.9 mg/dL (ref 8.9–10.3)
Chloride: 98 mmol/L (ref 98–111)
Creatinine, Ser: 1.72 mg/dL — ABNORMAL HIGH (ref 0.61–1.24)
GFR, Estimated: 40 mL/min — ABNORMAL LOW (ref >60)
Glucose, Bld: 146 mg/dL — ABNORMAL HIGH (ref 70–99)
Potassium: 4.6 mmol/L (ref 3.5–5.1)
Sodium: 136 mmol/L (ref 135–145)
Total Bilirubin: 0.8 mg/dL (ref 0.3–1.2)
Total Protein: 8.8 g/dL — ABNORMAL HIGH (ref 6.5–8.1)

## 2022-10-01 LAB — URINALYSIS, ROUTINE W REFLEX MICROSCOPIC
Bacteria, UA: NONE SEEN
Bilirubin Urine: NEGATIVE
Glucose, UA: NEGATIVE mg/dL
Hgb urine dipstick: NEGATIVE
Ketones, ur: NEGATIVE mg/dL
Nitrite: NEGATIVE
Protein, ur: 30 mg/dL — AB
Specific Gravity, Urine: 1.015 (ref 1.005–1.030)
WBC, UA: >50 WBC/hpf — ABNORMAL HIGH (ref 0–5)
pH: 6 (ref 5.0–8.0)

## 2022-10-01 LAB — LACTIC ACID, PLASMA
Lactic Acid, Venous: 2.6 mmol/L (ref 0.5–1.9)
Lactic Acid, Venous: 1.6 mmol/L (ref 0.5–1.9)

## 2022-10-01 LAB — TROPONIN I (HIGH SENSITIVITY)
Troponin I (High Sensitivity): 5 ng/L (ref <18)
Troponin I (High Sensitivity): 6 ng/L (ref <18)

## 2022-10-01 LAB — MAGNESIUM: Magnesium: 1.8 mg/dL (ref 1.7–2.4)

## 2022-10-01 LAB — GLUCOSE, CAPILLARY
Glucose-Capillary: 117 mg/dL — ABNORMAL HIGH (ref 70–99)
Glucose-Capillary: 125 mg/dL — ABNORMAL HIGH (ref 70–99)
Glucose-Capillary: 128 mg/dL — ABNORMAL HIGH (ref 70–99)

## 2022-10-01 LAB — LIPASE, BLOOD: Lipase: 37 U/L (ref 11–51)

## 2022-10-01 MED ORDER — ACETAMINOPHEN 325 MG PO TABS
650.0000 mg | ORAL_TABLET | Freq: Four times a day (QID) | ORAL | Status: DC | PRN
  Administered 2022-10-04 – 2022-10-05 (×2): 650 mg via ORAL
  Filled 2022-10-01 (×2): qty 2

## 2022-10-01 MED ORDER — ACETAMINOPHEN 650 MG RE SUPP: 650.0000 mg | Freq: Four times a day (QID) | RECTAL | Status: DC | PRN

## 2022-10-01 MED ORDER — ONDANSETRON HCL 4 MG/2ML IJ SOLN
4.0000 mg | Freq: Four times a day (QID) | INTRAMUSCULAR | Status: DC | PRN
  Administered 2022-10-02 – 2022-10-03 (×4): 4 mg via INTRAVENOUS
  Filled 2022-10-01 (×4): qty 2

## 2022-10-01 MED ORDER — BENZOCAINE 20 % MT AERO
INHALATION_SPRAY | Freq: Once | OROMUCOSAL | Status: AC
  Filled 2022-10-01: qty 57

## 2022-10-01 MED ORDER — LACTATED RINGERS IV BOLUS
1000.0000 mL | Freq: Once | INTRAVENOUS | Status: AC
  Administered 2022-10-01: 1000 mL via INTRAVENOUS

## 2022-10-01 MED ORDER — INSULIN ASPART 100 UNIT/ML IJ SOLN
0.0000 [IU] | INTRAMUSCULAR | Status: DC
  Administered 2022-10-01: 1 [IU] via SUBCUTANEOUS
  Administered 2022-10-04: 2 [IU] via SUBCUTANEOUS
  Administered 2022-10-05: 1 [IU] via SUBCUTANEOUS

## 2022-10-01 MED ORDER — HEPARIN SODIUM (PORCINE) 5000 UNIT/ML IJ SOLN
5000.0000 [IU] | Freq: Three times a day (TID) | INTRAMUSCULAR | Status: DC
  Administered 2022-10-01 – 2022-10-05 (×12): 5000 [IU] via SUBCUTANEOUS
  Filled 2022-10-01 (×12): qty 1

## 2022-10-01 MED ORDER — TIMOLOL HEMIHYDRATE 0.5 % OP SOLN: 1.0000 [drp] | Freq: Every morning | OPHTHALMIC | Status: DC

## 2022-10-01 MED ORDER — TIMOLOL MALEATE 0.5 % OP SOLN
1.0000 [drp] | Freq: Every day | OPHTHALMIC | Status: DC
  Administered 2022-10-02 – 2022-10-05 (×4): 1 [drp] via OPHTHALMIC
  Filled 2022-10-01: qty 5

## 2022-10-01 MED ORDER — ONDANSETRON HCL 4 MG PO TABS: 4.0000 mg | ORAL_TABLET | Freq: Four times a day (QID) | ORAL | Status: DC | PRN

## 2022-10-01 MED ORDER — LACTATED RINGERS IV SOLN: INTRAVENOUS | Status: DC

## 2022-10-01 MED ORDER — SODIUM CHLORIDE 0.9% FLUSH
3.0000 mL | Freq: Two times a day (BID) | INTRAVENOUS | Status: DC
  Administered 2022-10-01 – 2022-10-04 (×7): 3 mL via INTRAVENOUS

## 2022-10-01 MED ORDER — METOCLOPRAMIDE HCL 5 MG/ML IJ SOLN
10.0000 mg | Freq: Once | INTRAMUSCULAR | Status: AC
  Administered 2022-10-01: 10 mg via INTRAVENOUS
  Filled 2022-10-01: qty 2

## 2022-10-01 MED ORDER — HYDROMORPHONE HCL 1 MG/ML IJ SOLN
1.0000 mg | Freq: Once | INTRAMUSCULAR | Status: AC
  Administered 2022-10-01: 1 mg via INTRAVENOUS
  Filled 2022-10-01: qty 1

## 2022-10-01 MED ORDER — INFLUENZA VAC A&B SA ADJ QUAD 0.5 ML IM PRSY: 0.5000 mL | PREFILLED_SYRINGE | INTRAMUSCULAR | Status: DC

## 2022-10-01 MED ORDER — MORPHINE SULFATE (PF) 2 MG/ML IV SOLN
2.0000 mg | INTRAVENOUS | Status: DC | PRN
  Administered 2022-10-01 – 2022-10-04 (×22): 2 mg via INTRAVENOUS
  Filled 2022-10-01 (×22): qty 1

## 2022-10-01 MED ORDER — LATANOPROST 0.005 % OP SOLN
1.0000 [drp] | Freq: Every day | OPHTHALMIC | Status: DC
  Administered 2022-10-01 – 2022-10-04 (×4): 1 [drp] via OPHTHALMIC
  Filled 2022-10-01: qty 2.5

## 2022-10-01 NOTE — ED Triage Notes (Signed)
Pt c/o mid upper abdominal pain x 3 days ago describes as constant, unable to eat or drink due to severe nausea, denies vomiting. Hx of cancer and on chemotherapy

## 2022-10-01 NOTE — Assessment & Plan Note (Signed)
-   NPO - q4h SSI while NPO

## 2022-10-01 NOTE — ED Notes (Signed)
Pt notified a urine sample is needed at this time.

## 2022-10-01 NOTE — Assessment & Plan Note (Signed)
-  patient has history of CKD3b. Baseline creat ~ 1.6, eGFR 40 - currently at baseline

## 2022-10-01 NOTE — H&P (Signed)
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  He underwent left nephrectomy August 2023 with Dr. Gilford Rile.  He follows outpatient with oncology and has been treated with enfortumab and pembrolizumab.  He was recently started on Levaquin for a resistant UTI (ESBL Klebsiella) along with prednisone for some joint pains.  He completed antibiotics on 09/30/2022 and has only been on prednisone for approximately 1 week.  He did notice some improvement in his joint pains. He also has a history of prior SBO in January 2023 treated at [facility].  This did not require any surgical intervention and resolved after NG tube placement and bowel rest.  He presented to the ER due to 2 to 3-day complaints of nausea and unable to tolerate food along with epigastric abdominal pain. CT abdomen/pelvis was performed which showed multiple dilated small bowel loops in the left hemiabdomen with concern for transition point in the right lower quadrant. He underwent NG tube placement in the ER and general surgery was consulted.  He is admitted for further workup and evaluation.  I have personally briefly reviewed patient's old medical records in [facility] and discussed patient with the ER provider when appropriate/indicated.  Assessment and Plan: * Small bowel obstruction ([place]) - hx of similar Jan 2023, treated in [place] (no surgery needed, did need NGT), stayed in hospital 4-5 days; unable to review records however - presents with ~3 days poor appetite and epigastric abdominal pain  - CT shows "multiple mildly dilated loops of small bowel in the LEFT hemiabdomen with interdigitating fat stranding and trace fluid.  This is new in comparison to prior. Findings are concerning for small bowel obstruction. Transition point is favored to be in the RIGHT lower quadrant." - NGT inserted in ER; continue to LWIS - general surgery consulted via EDP, follow up consult - continue NPO, pain, and nausea control  - continue IVF   Transitional cell carcinoma of renal pelvis, left ([place]) - follows with oncology; last seen 09/21/22 - has been on prednisone ~1 week for joint pains, hold steroid in setting of possible surgery - recent PET 08/03/22 showing new mets to mediastinal nodes and left supraclavicular node along with new small round pulmonary nodules -Continue treatment outpatient with oncology  Chronic kidney disease, stage 3b ([place]) - patient has history of CKD3b. Baseline creat ~ 1.6, eGFR 40 - currently at baseline  Mixed hyperlipidemia - hold home meds  Controlled type 2 diabetes mellitus without complication, without long-term current use of insulin (HCC) - NPO - q4h SSI while NPO    Code Status:     Code Status: Full Code  DVT Prophylaxis: HSQ     Anticipated disposition is to: Home  History: Past Medical History:  Diagnosis Date   Anemia    Anxiety    Arthritis    Bladder tumor    Cancer ([place])    hx of skin cancer, renal cancer   Diabetes mellitus without complication (HCC)    GERD (gastroesophageal reflux disease)    Heart murmur    as a child   History of kidney stones    Port-A-Cath in place 08/17/2022   Sleep apnea    cpap  Past Surgical History:  Procedure Laterality Date   bilateral dupreyen contracture surgery      CHOLECYSTECTOMY     CYSTOSCOPY N/A 05/30/2022   Procedure: CYSTOSCOPY WITH TRANSURETHRAL RESECTION OF THE LEFT URETERAL ORIFICE,  TRANSURETHRAL RESECTION OF BLADDER TUMOR;  Surgeon: Ceasar Mons, MD;  Location: [facility];  Service: Urology;  Laterality: N/A;   CYSTOSCOPY N/A 09/06/2022   Procedure: CYSTOSCOPY;  Surgeon: Cleon Gustin, MD;   Location: [facility];  Service: Urology;  Laterality: N/A;   CYSTOSCOPY WITH RETROGRADE PYELOGRAM, URETEROSCOPY AND STENT PLACEMENT Left 04/26/2022   Procedure: CYSTOSCOPY WITH RETROGRADE PYELOGRAM, URETEROSCOPY AND STENT PLACEMENT;  Surgeon: Cleon Gustin, MD;  Location: [facility];  Service: Urology;  Laterality: Left;   IR IMAGING GUIDED PORT INSERTION  08/16/2022   LIVER BIOPSY     ROBOT ASSITED LAPAROSCOPIC NEPHROURETERECTOMY Left 05/30/2022   Procedure: XI ROBOT ASSITED LAPAROSCOPIC NEPHROURETERECTOMY;  Surgeon: Ceasar Mons, MD;  Location: [facility];  Service: Urology;  Laterality: Left;   TRANSURETHRAL RESECTION OF BLADDER TUMOR  04/26/2022   Procedure: TRANSURETHRAL RESECTION OF BLADDER TUMOR (TURBT);  Surgeon: Cleon Gustin, MD;  Location: [facility];  Service: Urology;;   TRANSURETHRAL RESECTION OF BLADDER TUMOR N/A 09/06/2022   Procedure: TRANSURETHRAL RESECTION OF BLADDER TUMOR (TURBT);  Surgeon: Cleon Gustin, MD;  Location: [facility];  Service: Urology;  Laterality: N/A;     reports that he has quit smoking. His smoking use included cigarettes. He has never used smokeless tobacco. He reports that he does not drink alcohol and does not use drugs.  No Known Allergies  No family history on file.  Home Medications: Prior to Admission medications   Medication Sig Start Date End Date Taking? Authorizing Provider  citalopram (CELEXA) 20 MG tablet Take 20 mg by mouth in the morning.   Yes [provider]  docusate sodium (COLACE) 100 MG capsule Take 1 capsule (100 mg total) by mouth 2 (two) times daily as needed for mild constipation. 06/02/22 06/02/23 Yes Ceasar Mons, MD  Enfortumab Vedotin-ejfv (PADCEV IV) Inject 1 Dose into the vein See admin instructions. Inject 1 dose into the vein on days 1 and 8 every 21 days 08/17/22  Yes [provider]  fluorouracil (EFUDEX) 5 % cream Apply 1 Application topically 2 (two) times daily as needed (cancer  spots).   Yes [provider]  latanoprost (XALATAN) 0.005 % ophthalmic solution Place 1 drop into both eyes at bedtime. 12/19/21  Yes [provider]  lidocaine-prilocaine (EMLA) cream Apply a small amount to port a cath site and cover with plastic wrap 1 hour prior to infusion Patient taking differently: Apply 1 Application topically daily as needed (port access). 08/17/22  Yes Derek Jack, MD  metFORMIN (GLUCOPHAGE) 500 MG tablet Take 500 mg by mouth 2 (two) times daily.   Yes [provider]  omeprazole (PRILOSEC) 20 MG capsule Take 20 mg by mouth 2 (two) times daily.   Yes [provider]  ondansetron (ZOFRAN-ODT) 8 MG disintegrating tablet Take 1 tablet (8 mg total) by mouth every 8 (eight) hours as needed for nausea or vomiting. 09/21/22  Yes Derek Jack, MD  Oxycodone HCl 10 MG TABS Take 1 tablet (10 mg total) by mouth every 8 (eight) hours as needed. Patient taking differently: Take 10 mg by mouth every 8 (eight) hours as needed (pain). 09/28/22  Yes Derek Jack, MD  Pembrolizumab [facility] IV) Inject 1 Dose into the vein See admin instructions. Inject into  the vein every 21 days   Yes [provider]  predniSONE (DELTASONE) 10 MG tablet Take 1 tablet (10 mg total) by mouth daily with breakfast. Patient taking differently: Take 10 mg by mouth daily with breakfast. Continuous course. 09/28/22  Yes Derek Jack, MD  promethazine (PHENERGAN) 25 MG suppository Place 1 suppository (25 mg total) rectally every 6 (six) hours as needed for nausea or vomiting. 09/25/22  Yes Derek Jack, MD  simvastatin (ZOCOR) 40 MG tablet Take 40 mg by mouth at bedtime.   Yes [provider]  tamsulosin (FLOMAX) 0.4 MG CAPS capsule Take 0.4 mg by mouth at bedtime.   Yes [provider]  timolol (BETIMOL) 0.5 % ophthalmic solution Place 1 drop into both eyes in the morning.   Yes [provider]   tiZANidine (ZANAFLEX) 2 MG tablet Take 2 mg by mouth at bedtime.   Yes [provider]  levofloxacin (LEVAQUIN) 500 MG tablet Take 1 tablet (500 mg total) by mouth daily. Patient not taking: Reported on 10/01/2022 09/25/22   Cleon Gustin, MD  metoCLOPramide (REGLAN) 10 MG tablet Take 1 tablet (10 mg total) by mouth every 6 (six) hours as needed for nausea or vomiting. Patient not taking: Reported on 10/01/2022 09/20/22   Evalee Jefferson, PA-C  OLANZapine (ZYPREXA) 5 MG tablet Take 1 tablet (5 mg total) by mouth at bedtime. Patient not taking: Reported on 10/01/2022 09/28/22   Derek Jack, MD  prochlorperazine (COMPAZINE) 10 MG tablet Take 1 tablet (10 mg total) by mouth every 6 (six) hours as needed for nausea or vomiting. Patient not taking: Reported on 09/28/2022 08/24/22   Derek Jack, MD    Review of Systems:  Review of Systems  Constitutional:  Negative for chills and fever.  HENT: Negative.    Eyes: Negative.   Respiratory: Negative.    Cardiovascular: Negative.   Gastrointestinal:  Positive for abdominal pain and nausea.  Genitourinary: Negative.   Musculoskeletal: Negative.   Skin: Negative.   Neurological: Negative.   Endo/Heme/Allergies: Negative.   Psychiatric/Behavioral: Negative.      Physical Exam:  Vitals:   10/01/22 1400 10/01/22 1430 10/01/22 1518 10/01/22 1521  BP: (!) 151/72 (!) 151/63  (!) 152/80  Pulse: 89 86  (!) 101  Resp: _0 Temp:  98.1 F (36.7 C)  97.9 F (36.6 C)  TempSrc:  Oral    SpO2: 98% 98%  96%  Weight:   73.9 kg   Height:   6' (1.829 m)    Physical Exam Constitutional:      General: He is not in acute distress.    Appearance: He is well-developed.  HENT:     Head: Normocephalic and atraumatic.     Mouth/Throat:     Mouth: Mucous membranes are moist.  Eyes:     Extraocular Movements: Extraocular movements intact.  Cardiovascular:     Rate and Rhythm: Normal rate and regular rhythm.   Pulmonary:     Effort: Pulmonary effort is normal. No respiratory distress.     Breath sounds: Normal breath sounds. No wheezing.  Abdominal:     Comments: Tenderness to palpation in epigastrium and right quadrants with no rebound or guarding.  Bowel sounds hypoactive.  Prior surgical scar noted in left lower quadrant with no surrounding tenderness.  Musculoskeletal:        General: Normal range of motion.     Cervical back: Normal range of motion and neck supple.  Skin:  General: Skin is warm and dry.  Neurological:     General: No focal deficit present.     Mental Status: He is alert.  Psychiatric:        Mood and Affect: Mood normal.        Behavior: Behavior normal.      Labs on Admission:  I have personally reviewed following labs and imaging studies Results for orders placed or performed during the hospital encounter of 10/01/22 (from the past 24 hour(s))  Comprehensive metabolic panel     Status: Abnormal   Collection Time: 10/01/22  9:44 AM  Result Value Ref Range   Sodium 136 135 - 145 mmol/L   Potassium 4.6 3.5 - 5.1 mmol/L   Chloride 98 98 - 111 mmol/L   CO2 26 22 - 32 mmol/L   Glucose, Bld 146 (H) 70 - 99 mg/dL   BUN 37 (H) 8 - 23 mg/dL   Creatinine, Ser 1.72 (H) 0.61 - 1.24 mg/dL   Calcium 9.9 8.9 - 10.3 mg/dL   Total Protein 8.8 (H) 6.5 - 8.1 g/dL   Albumin 4.3 3.5 - 5.0 g/dL   AST 22 15 - 41 U/L   ALT 15 0 - 44 U/L   Alkaline Phosphatase 81 38 - 126 U/L   Total Bilirubin 0.8 0.3 - 1.2 mg/dL   GFR, Estimated 40 (L) >60 mL/min   Anion gap 12 5 - 15  Lipase, blood     Status: None   Collection Time: 10/01/22  9:44 AM  Result Value Ref Range   Lipase 37 11 - 51 U/L  CBC with Diff     Status: Abnormal   Collection Time: 10/01/22  9:44 AM  Result Value Ref Range   WBC 19.0 (H) 4.0 - 10.5 K/uL   RBC 5.72 4.22 - 5.81 MIL/uL   Hemoglobin 11.3 (L) 13.0 - 17.0 g/dL   HCT 36.3 (L) 39.0 - 52.0 %   MCV 63.5 (L) 80.0 - 100.0 fL   MCH 19.8 (L) 26.0 - 34.0 pg    MCHC 31.1 30.0 - 36.0 g/dL   RDW 19.1 (H) 11.5 - 15.5 %   Platelets 547 (H) 150 - 400 K/uL   nRBC 0.0 0.0 - 0.2 %   Neutrophils Relative % 86 %   Neutro Abs 16.4 (H) 1.7 - 7.7 K/uL   Lymphocytes Relative 5 %   Lymphs Abs 1.0 0.7 - 4.0 K/uL   Monocytes Relative 7 %   Monocytes Absolute 1.3 (H) 0.1 - 1.0 K/uL   Eosinophils Relative 0 %   Eosinophils Absolute 0.0 0.0 - 0.5 K/uL   Basophils Relative 1 %   Basophils Absolute 0.1 0.0 - 0.1 K/uL   WBC Morphology TOXIC GRANULATION    Smear Review MORPHOLOGY UNREMARKABLE    Immature Granulocytes 1 %   Abs Immature Granulocytes 0.18 (H) 0.00 - 0.07 K/uL   Spherocytes PRESENT   Magnesium     Status: None   Collection Time: 10/01/22  9:44 AM  Result Value Ref Range   Magnesium 1.8 1.7 - 2.4 mg/dL  Troponin I (High Sensitivity)     Status: None   Collection Time: 10/01/22  9:44 AM  Result Value Ref Range   Troponin I (High Sensitivity) 5 <18 ng/L  Lactic acid, plasma     Status: Abnormal   Collection Time: 10/01/22 10:04 AM  Result Value Ref Range   Lactic Acid, Venous 2.6 (HH) 0.5 - 1.9 mmol/L  Urinalysis, Routine w reflex  microscopic Urine, Clean Catch     Status: Abnormal   Collection Time: 10/01/22 10:44 AM  Result Value Ref Range   Color, Urine YELLOW YELLOW   APPearance HAZY (A) CLEAR   Specific Gravity, Urine 1.015 1.005 - 1.030   pH 6.0 5.0 - 8.0   Glucose, UA NEGATIVE NEGATIVE mg/dL   Hgb urine dipstick NEGATIVE NEGATIVE   Bilirubin Urine NEGATIVE NEGATIVE   Ketones, ur NEGATIVE NEGATIVE mg/dL   Protein, ur 30 (A) NEGATIVE mg/dL   Nitrite NEGATIVE NEGATIVE   Leukocytes,Ua LARGE (A) NEGATIVE   RBC / HPF 6-10 0 - 5 RBC/hpf   WBC, UA >50 (H) 0 - 5 WBC/hpf   Bacteria, UA NONE SEEN NONE SEEN   Squamous Epithelial / LPF 0-5 0 - 5  Troponin I (High Sensitivity)     Status: None   Collection Time: 10/01/22 11:24 AM  Result Value Ref Range   Troponin I (High Sensitivity) 6 <18 ng/L  Lactic acid, plasma     Status: None    Collection Time: 10/01/22 12:52 PM  Result Value Ref Range   Lactic Acid, Venous 1.6 0.5 - 1.9 mmol/L     Radiological Exams on Admission: DG Abd Portable 1 View  Result Date: 10/01/2022 CLINICAL DATA:  Evaluate NG placement EXAM: PORTABLE ABDOMEN - 1 VIEW COMPARISON:  Earlier today FINDINGS: There is a enteric tube with tip and side port below the GE junction within the gastric fundus. Mildly dilated loops of small bowel are noted in the left hemiabdomen. IMPRESSION: Enteric tube tip and side port below the GE junction within the gastric fundus. Electronically Signed   By: Kerby Moors M.D.   On: 10/01/2022 13:58   DG Abd Portable 1 View  Result Date: 10/01/2022 CLINICAL DATA:  NG tube placement. EXAM: PORTABLE ABDOMEN - 1 VIEW COMPARISON:  None Available. FINDINGS: NG tube tip is in the mid stomach with proximal side port at or just below the GE junction. Mild gaseous small bowel distension noted in the left abdomen. IMPRESSION: NG tube tip is in the mid stomach with proximal side port at or just below the GE junction. NG tube could be advanced approximately 3 cm to ensure side port is below the GE junction. Electronically Signed   By: Misty Stanley M.D.   On: 10/01/2022 13:10   CT ABDOMEN PELVIS WO CONTRAST  Result Date: 10/01/2022 CLINICAL DATA:  Abdominal pain, acute, nonlocalized. History of metastatic bladder cancer. 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 EXAM: CT ABDOMEN AND PELVIS WITHOUT CONTRAST TECHNIQUE: Multidetector CT imaging of the abdomen and pelvis was performed following the standard protocol without IV contrast. RADIATION DOSE REDUCTION: This exam was performed according to the departmental dose-optimization program which includes automated exposure control, adjustment of the mA and/or kV according to patient size and/or use of iterative reconstruction technique. * Tracking Code: BO * COMPARISON:  September 20, 2022; August 03 2022, April 25, 2022 FINDINGS: Evaluation is limited by lack of IV contrast. Lower chest: Revisualization of multiple pulmonary nodules. Representative RIGHT lower lobe pulmonary nodule measures 8 mm, not significantly changed in comparison to most recent prior (series 4, image 16). Additional representative RIGHT middle lobe subpleural pulmonary nodule measures 11 by 7 mm, similar dating back to October 2023 given differences in technique but newly conspicuous since July (series 4, image 9). Hepatobiliary: Multiple coarse calcifications of the liver. Status post cholecystectomy. Similar dilation of the common bile duct to approximately 10 mm, consistent with  post cholecystectomy state. Pancreas: No peripancreatic fat stranding. Spleen: Unremarkable. Adrenals/Urinary Tract: Adrenal glands are unremarkable. Status post LEFT nephrectomy. No new nodularity identified within the LEFT nephrectomy bed. No RIGHT-sided hydronephrosis. No obstructing RIGHT-sided nephrolithiasis. Bladder is mildly distended. Stomach/Bowel: There are multiple mildly dilated loops of small bowel in the LEFT hemiabdomen. Representative loopd of bowel measures 3.8 cm in diameter. There is interdigitating fat stranding and trace fluid. No definitive pneumatosis. Transition point is favored to be in the RIGHT lower quadrant loop of small bowel (series 5, image 60; series 2, image 49). Diverticulosis throughout the sigmoid colon. Moderate predominately RIGHT-sided colonic stool burden. Appendix is normal. Vascular/Lymphatic: Atherosclerotic calcifications of the nonaneurysmal abdominal aorta. LEFT periaortic lymph node measuring 1.1 cm in the short axis is similar in appearance in comparison to prior (series 2, image 35). Additional ill-defined LEFT periaortic lymph node measures 10 mm in the short axis, unchanged (series 2, image 32). Reproductive: Prostatomegaly. Other: Small volume free fluid in the pelvis. Musculoskeletal: Degenerative changes of the lumbar  spine. No new aggressive osseous lesions identified. Grade 1 anterolisthesis of L4-5. IMPRESSION: 1. There are multiple mildly dilated loops of small bowel in the LEFT hemiabdomen with interdigitating fat stranding and trace fluid. This is new in comparison to prior. Findings are concerning for small bowel obstruction. Transition point is favored to be in the RIGHT lower quadrant. 2. Small volume free fluid in the pelvis, nonspecific and possibly reactive. Evaluation for peritoneal nodularity is limited by lack of IV contrast. 3. Revisualization of multiple pulmonary nodules, most consistent with metastatic disease. 4. Similar appearance of retroperitoneal adenopathy. Aortic Atherosclerosis (ICD10-I70.0). Electronically Signed   By: Valentino Saxon M.D.   On: 10/01/2022 10:39   DG Abd Portable 1 View  Final Result    DG Abd Portable 1 View  Final Result    CT ABDOMEN PELVIS WO CONTRAST  Final Result      Consults called:  General surgery   EKG: Independently reviewed. NSR   Dwyane Dee, MD [facility] 10/01/2022, 3:22 PM

## 2022-10-01 NOTE — Assessment & Plan Note (Signed)
-   hx of similar Jan 2023, treated in [place] (no surgery needed, did need NGT), stayed in hospital 4-5 days; unable to review records however - presents with ~3 days poor appetite and epigastric abdominal pain  - CT shows "multiple mildly dilated loops of small bowel in the LEFT hemiabdomen with interdigitating fat stranding and trace fluid. This is new in comparison to prior. Findings are concerning for small bowel obstruction. Transition point is favored to be in the RIGHT lower quadrant." - NGT inserted in ER; continued to LWIS - general surgery following - continue NPO, pain, and nausea control  - continue IVF - gastrograffin study being performed 12/26; if passes, potential trial of CLD - noted his prolonged NPO status; he had "poor" intake ~3 days prior to admission per family and if does have ongoing NPO status, would need to consider early TPN if going to require surgery, but hopefully SB followthru is reassuring

## 2022-10-01 NOTE — Assessment & Plan Note (Signed)
-   hold home meds

## 2022-10-01 NOTE — Progress Notes (Signed)
Patient arrived to floor in stable condition , NG tube to right nare.     10/01/22 1521  Vitals  Temp 97.9 F (36.6 C)  BP (!) 152/80  MAP (mmHg) 97  BP Location Left Arm  BP Method Automatic  Patient Position (if appropriate) Sitting  Pulse Rate (!) 101  Pulse Rate Source Monitor  Resp 17  MEWS COLOR  MEWS Score Color Green  Oxygen Therapy  SpO2 96 %  O2 Device Room Air  Pain Assessment  Pain Scale 0-10  Pain Score 8  Pain Type Chronic pain;Acute pain  Pain Location Abdomen  Pain Orientation Mid  MEWS Score  MEWS Temp 0  MEWS Systolic 0  MEWS Pulse 1  MEWS RR 0  MEWS LOC 0  MEWS Score 1

## 2022-10-01 NOTE — Assessment & Plan Note (Addendum)
-   follows with oncology; last seen 09/21/22 - has been on prednisone ~1 week for joint pains, hold steroid in setting of possible surgery - recent PET 08/03/22 showing new mets to mediastinal nodes and left supraclavicular node along with new small round pulmonary nodules -Continue treatment outpatient with oncology

## 2022-10-01 NOTE — Hospital Course (Signed)
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  He underwent left nephrectomy August 2023 with Dr. Gilford Rile.  He follows outpatient with oncology and has been treated with enfortumab and pembrolizumab.  He was recently started on Levaquin for a resistant UTI (ESBL Klebsiella) along with prednisone for some joint pains.  He completed antibiotics on 09/30/2022 and has only been on prednisone for approximately 1 week.  He did notice some improvement in his joint pains. He also has a history of prior SBO in January 2023 treated at [facility].  This did not require any surgical intervention and resolved after NG tube placement and bowel rest.  He presented to the ER due to 2 to 3-day complaints of nausea and unable to tolerate food along with epigastric abdominal pain. CT abdomen/pelvis was performed which showed multiple dilated small bowel loops in the left hemiabdomen with concern for transition point in the right lower quadrant. He underwent NG tube placement in the ER and general surgery was consulted.  He is admitted for further workup and evaluation.

## 2022-10-01 NOTE — ED Notes (Signed)
Pt had a spike in HR while NG tube insertion and afterwards as he states he is in pain. HR went up to 140s but is now maintaining HR in 80s-90s. Pain medication administered

## 2022-10-01 NOTE — ED Notes (Signed)
Pt wife at bedside states pt is being treated for a UTI that is multi-drug resistant and is on his last dose of levaquin; However, has not taken it today

## 2022-10-01 NOTE — ED Provider Notes (Signed)
[facility] EMERGENCY DEPARTMENT Provider Note   CSN: [id number] Arrival date & time: 10/01/22  5520     History  Chief Complaint  Patient presents with   Abdominal Pain    Clancey Welton Demond is a 81 y.o. male.  HPI Patient is for abdominal pain, nausea, and p.o. intolerance.  This has been present for the past 3 days.  His medical history includes metastatic urothelial carcinoma, T2DM, HLD, anemia, anxiety, arthritis.  Patient scribes location of abdominal pain as epigastric.  It does not radiate.  He takes 10 mg oxycodones 3 times daily for his chronic pain.  He did get a dose this morning.  Current pain severity is 7/10.  He does currently have nausea despite Phenergan suppository and Zofran at home.  His last bowel movement was 2 days ago.  He has a surgical history of left nephrectomy and cholecystectomy.  He is currently on his last day of antibiotic (Levaquin) therapy for UTI.    Home Medications Prior to Admission medications   Medication Sig Start Date End Date Taking? Authorizing Provider  citalopram (CELEXA) 20 MG tablet Take 20 mg by mouth in the morning.   Yes [provider]  docusate sodium (COLACE) 100 MG capsule Take 1 capsule (100 mg total) by mouth 2 (two) times daily as needed for mild constipation. 06/02/22 06/02/23 Yes Ceasar Mons, MD  Enfortumab Vedotin-ejfv (PADCEV IV) Inject 1 Dose into the vein See admin instructions. Inject 1 dose into the vein on days 1 and 8 every 21 days 08/17/22  Yes [provider]  fluorouracil (EFUDEX) 5 % cream Apply 1 Application topically 2 (two) times daily as needed (cancer spots).   Yes [provider]  latanoprost (XALATAN) 0.005 % ophthalmic solution Place 1 drop into both eyes at bedtime. 12/19/21  Yes [provider]  lidocaine-prilocaine (EMLA) cream Apply a small amount to port a cath site and cover with plastic wrap 1 hour prior to infusion Patient taking differently: Apply  1 Application topically daily as needed (port access). 08/17/22  Yes Derek Jack, MD  metFORMIN (GLUCOPHAGE) 500 MG tablet Take 500 mg by mouth 2 (two) times daily.   Yes [provider]  omeprazole (PRILOSEC) 20 MG capsule Take 20 mg by mouth 2 (two) times daily.   Yes [provider]  ondansetron (ZOFRAN-ODT) 8 MG disintegrating tablet Take 1 tablet (8 mg total) by mouth every 8 (eight) hours as needed for nausea or vomiting. 09/21/22  Yes Derek Jack, MD  Oxycodone HCl 10 MG TABS Take 1 tablet (10 mg total) by mouth every 8 (eight) hours as needed. Patient taking differently: Take 10 mg by mouth every 8 (eight) hours as needed (pain). 09/28/22  Yes Derek Jack, MD  Pembrolizumab [facility] IV) Inject 1 Dose into the vein See admin instructions. Inject into the vein every 21 days   Yes [provider]  predniSONE (DELTASONE) 10 MG tablet Take 1 tablet (10 mg total) by mouth daily with breakfast. Patient taking differently: Take 10 mg by mouth daily with breakfast. Continuous course. 09/28/22  Yes Derek Jack, MD  promethazine (PHENERGAN) 25 MG suppository Place 1 suppository (25 mg total) rectally every 6 (six) hours as needed for nausea or vomiting. 09/25/22  Yes Derek Jack, MD  simvastatin (ZOCOR) 40 MG tablet Take 40 mg by mouth at bedtime.   Yes [provider]  tamsulosin (FLOMAX) 0.4 MG CAPS capsule Take 0.4 mg by mouth at bedtime.   Yes [provider]  timolol (BETIMOL) 0.5 % ophthalmic solution Place 1 drop into both eyes in the morning.   Yes [provider]  tiZANidine (ZANAFLEX) 2 MG tablet Take 2 mg by mouth at bedtime.   Yes [provider]  levofloxacin (LEVAQUIN) 500 MG tablet Take 1 tablet (500 mg total) by mouth daily. Patient not taking: Reported on 10/01/2022 09/25/22   Cleon Gustin, MD  metoCLOPramide (REGLAN) 10 MG tablet Take 1 tablet (10 mg total) by mouth every  6 (six) hours as needed for nausea or vomiting. Patient not taking: Reported on 10/01/2022 09/20/22   Evalee Jefferson, PA-C  OLANZapine (ZYPREXA) 5 MG tablet Take 1 tablet (5 mg total) by mouth at bedtime. Patient not taking: Reported on 10/01/2022 09/28/22   Derek Jack, MD  prochlorperazine (COMPAZINE) 10 MG tablet Take 1 tablet (10 mg total) by mouth every 6 (six) hours as needed for nausea or vomiting. Patient not taking: Reported on 09/28/2022 08/24/22   Derek Jack, MD      Allergies    Patient has no known allergies.    Review of Systems   Review of Systems  Constitutional:  Positive for appetite change and fatigue.  Gastrointestinal:  Positive for abdominal pain and nausea.  All other systems reviewed and are negative.   Physical Exam Updated Vital Signs BP (!) 149/72   Pulse 91   Temp 98.1 F (36.7 C) (Oral)   Resp 12   Ht 6' (1.829 m)   Wt 75.4 kg   SpO2 96%   BMI 22.55 kg/m  Physical Exam Vitals and nursing note reviewed.  Constitutional:      General: He is not in acute distress.    Appearance: Normal appearance. He is well-developed and normal weight. He is not ill-appearing, toxic-appearing or diaphoretic.  HENT:     Head: Normocephalic and atraumatic.     Right Ear: External ear normal.     Left Ear: External ear normal.     Nose: Nose normal.     Mouth/Throat:     Mouth: Mucous membranes are moist.  Eyes:     Extraocular Movements: Extraocular movements intact.     Conjunctiva/sclera: Conjunctivae normal.  Cardiovascular:     Rate and Rhythm: Normal rate and regular rhythm.  Pulmonary:     Effort: Pulmonary effort is normal. No respiratory distress.     Breath sounds: Normal breath sounds. No wheezing or rales.  Abdominal:     General: There is no distension.     Palpations: Abdomen is soft.     Tenderness: There is abdominal tenderness (mild).  Musculoskeletal:        General: No swelling. Normal range of motion.     Cervical  back: Normal range of motion and neck supple.     Right lower leg: No edema.     Left lower leg: No edema.  Skin:    General: Skin is warm and dry.     Capillary Refill: Capillary refill takes less than 2 seconds.     Coloration: Skin is not jaundiced or pale.  Neurological:     General: No focal deficit present.     Mental Status: He is alert and oriented to person, place, and time.     Cranial Nerves: No cranial nerve deficit.     Sensory: No sensory deficit.     Motor: No weakness.     Coordination: Coordination normal.  Psychiatric:        Mood and  Affect: Mood normal.        Behavior: Behavior normal.        Thought Content: Thought content normal.        Judgment: Judgment normal.     ED Results / Procedures / Treatments   Labs (all labs ordered are listed, but only abnormal results are displayed) Labs Reviewed  COMPREHENSIVE METABOLIC PANEL - Abnormal; Notable for the following components:      Result Value   Glucose, Bld 146 (*)    BUN 37 (*)    Creatinine, Ser 1.72 (*)    Total Protein 8.8 (*)    GFR, Estimated 40 (*)    All other components within normal limits  CBC WITH DIFFERENTIAL/PLATELET - Abnormal; Notable for the following components:   WBC 19.0 (*)    Hemoglobin 11.3 (*)    HCT 36.3 (*)    MCV 63.5 (*)    MCH 19.8 (*)    RDW 19.1 (*)    Platelets 547 (*)    Neutro Abs 16.4 (*)    Monocytes Absolute 1.3 (*)    Abs Immature Granulocytes 0.18 (*)    All other components within normal limits  URINALYSIS, ROUTINE W REFLEX MICROSCOPIC - Abnormal; Notable for the following components:   APPearance HAZY (*)    Protein, ur 30 (*)    Leukocytes,Ua LARGE (*)    WBC, UA >50 (*)    All other components within normal limits  LACTIC ACID, PLASMA - Abnormal; Notable for the following components:   Lactic Acid, Venous 2.6 (*)    All other components within normal limits  LIPASE, BLOOD  MAGNESIUM  TROPONIN I (HIGH SENSITIVITY)  TROPONIN I (HIGH SENSITIVITY)     EKG EKG Interpretation  Date/Time:  Sunday October 01 2022 10:07:46 EST Ventricular Rate:  89 PR Interval:  152 QRS Duration: 89 QT Interval:  384 QTC Calculation: 468 R Axis:   3 Text Interpretation: Sinus rhythm Abnormal R-wave progression, early transition Confirmed by Godfrey Pick ([id number]) on 10/01/2022 10:51:44 AM  Radiology CT ABDOMEN PELVIS WO CONTRAST  Result Date: 10/01/2022 CLINICAL DATA:  Abdominal pain, acute, nonlocalized. History of metastatic bladder cancer. 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 EXAM: CT ABDOMEN AND PELVIS WITHOUT CONTRAST TECHNIQUE: Multidetector CT imaging of the abdomen and pelvis was performed following the standard protocol without IV contrast. RADIATION DOSE REDUCTION: This exam was performed according to the departmental dose-optimization program which includes automated exposure control, adjustment of the mA and/or kV according to patient size and/or use of iterative reconstruction technique. * Tracking Code: BO * COMPARISON:  September 20, 2022; August 03 2022, April 25, 2022 FINDINGS: Evaluation is limited by lack of IV contrast. Lower chest: Revisualization of multiple pulmonary nodules. Representative RIGHT lower lobe pulmonary nodule measures 8 mm, not significantly changed in comparison to most recent prior (series 4, image 16). Additional representative RIGHT middle lobe subpleural pulmonary nodule measures 11 by 7 mm, similar dating back to October 2023 given differences in technique but newly conspicuous since July (series 4, image 9). Hepatobiliary: Multiple coarse calcifications of the liver. Status post cholecystectomy. Similar dilation of the common bile duct to approximately 10 mm, consistent with post cholecystectomy state. Pancreas: No peripancreatic fat stranding. Spleen: Unremarkable. Adrenals/Urinary Tract: Adrenal glands are unremarkable. Status post LEFT nephrectomy. No new nodularity identified within  the LEFT nephrectomy bed. No RIGHT-sided hydronephrosis. No obstructing RIGHT-sided nephrolithiasis. Bladder is mildly distended. Stomach/Bowel: There are multiple mildly dilated loops of small bowel  in the LEFT hemiabdomen. Representative loopd of bowel measures 3.8 cm in diameter. There is interdigitating fat stranding and trace fluid. No definitive pneumatosis. Transition point is favored to be in the RIGHT lower quadrant loop of small bowel (series 5, image 60; series 2, image 49). Diverticulosis throughout the sigmoid colon. Moderate predominately RIGHT-sided colonic stool burden. Appendix is normal. Vascular/Lymphatic: Atherosclerotic calcifications of the nonaneurysmal abdominal aorta. LEFT periaortic lymph node measuring 1.1 cm in the short axis is similar in appearance in comparison to prior (series 2, image 35). Additional ill-defined LEFT periaortic lymph node measures 10 mm in the short axis, unchanged (series 2, image 32). Reproductive: Prostatomegaly. Other: Small volume free fluid in the pelvis. Musculoskeletal: Degenerative changes of the lumbar spine. No new aggressive osseous lesions identified. Grade 1 anterolisthesis of L4-5. IMPRESSION: 1. There are multiple mildly dilated loops of small bowel in the LEFT hemiabdomen with interdigitating fat stranding and trace fluid. This is new in comparison to prior. Findings are concerning for small bowel obstruction. Transition point is favored to be in the RIGHT lower quadrant. 2. Small volume free fluid in the pelvis, nonspecific and possibly reactive. Evaluation for peritoneal nodularity is limited by lack of IV contrast. 3. Revisualization of multiple pulmonary nodules, most consistent with metastatic disease. 4. Similar appearance of retroperitoneal adenopathy. Aortic Atherosclerosis (ICD10-I70.0). Electronically Signed   By: Valentino Saxon M.D.   On: 10/01/2022 10:39    Procedures Procedures    Medications Ordered in ED Medications   HYDROmorphone (DILAUDID) injection 1 mg (1 mg Intravenous Given 10/01/22 1005)  metoCLOPramide (REGLAN) injection 10 mg (10 mg Intravenous Given 10/01/22 1001)  lactated ringers bolus 1,000 mL (1,000 mLs Intravenous Bolus 10/01/22 1005)  lactated ringers bolus 1,000 mL (0 mLs Intravenous Stopped 10/01/22 1151)  Benzocaine (HURRCAINE) 20 % mouth spray ( Mouth/Throat Given 10/01/22 1144)    ED Course/ Medical Decision Making/ A&P                           Medical Decision Making Amount and/or Complexity of Data Reviewed Labs: ordered. Radiology: ordered.  Risk Prescription drug management.   This patient presents to the ED for concern of abdominal pain and p.o. intolerance, this involves an extensive number of treatment options, and is a complaint that carries with it a high risk of complications and morbidity.  The differential diagnosis includes gastritis, enteritis, SBO, metastatic disease, viral illness   Co morbidities that complicate the patient evaluation  metastatic urothelial carcinoma, T2DM, HLD, anemia, anxiety, arthritis   Additional history obtained:  Additional history obtained from patient's wife External records from outside source obtained and reviewed including EMR   Lab Tests:  I Ordered, and personally interpreted labs.  The pertinent results include: Creatinine is baseline.  Electrolytes are normal.  A leukocytosis is present.  Initial lactic acid is elevated consistent with dehydration.  On urinalysis, leukocytes are present without evidence of bacteria.   Imaging Studies ordered:  I ordered imaging studies including CT of abdomen and pelvis I independently visualized and interpreted imaging which showed findings consistent with small bowel obstruction I agree with the radiologist interpretation   Cardiac Monitoring: / EKG:  The patient was maintained on a cardiac monitor.  I personally viewed and interpreted the cardiac monitored which showed an  underlying rhythm of: Sinus rhythm   Consultations Obtained:  I requested consultation with the general surgeon, Dr. Okey Dupre,  and discussed lab and imaging findings as well as  pertinent plan - they recommend: Medical management and admission to hospitalist.  She will see in consult tomorrow.   Problem List / ED Course / Critical interventions / Medication management  Patient is a 81 year old male presenting for 3 days of abdominal pain, nausea, and p.o. intolerance.  Vital signs on arrival are normal.  Patient is well-appearing on exam.  His abdomen is soft and he endorses only mild tenderness to the palpation in the upper abdominal region.  He does endorse current pain and nausea.  Dilaudid and Reglan ordered for symptomatic relief.  Given his p.o. intolerance for the past 3 days, IV fluids ordered for hydration.  Lab work and CT imaging studies were ordered.  On CT, patient has evidence of small bowel obstruction.  He states that he had the same earlier this year in January.  He was agreeable to placement of NG tube.  Patient's initial lactic acid was elevated.  I suspect this is from dehydration.  Current creatinine is baseline.  Electrolytes are normal.  A leukocytosis is present on CBC.  Currently, no clear source of infection is identified.  On urinalysis, there is leukocytes without bacteria.  He is currently completing treatment for UTI.  Pyuria may be secondary to his known urothelial cancer.  Despite lactic acidosis and leukocytosis, vital signs not consistent with sepsis.  Decision for additional antibiotics is deferred to hospitalist.  I spoke with general surgeon on-call, Dr. Okey Dupre, who agrees there is no clear indication for antibiotics at this time.  She does agree with medical management of his small bowel obstruction and will see him in consultation tomorrow morning.  Patient was admitted to hospitalist for further management. I ordered medication including IV fluids for  hydration; Reglan for nausea; Dilaudid for analgesia Reevaluation of the patient after these medicines showed that the patient improved I have reviewed the patients home medicines and have made adjustments as needed   Social Determinants of Health:  Has access to outpatient care         Final Clinical Impression(s) / ED Diagnoses Final diagnoses:  Small bowel obstruction [facility])    Rx / DC Orders ED Discharge Orders     None         Godfrey Pick, MD 10/01/22 1207

## 2022-10-01 NOTE — ED Notes (Addendum)
Date and time results received: 10/01/22 1052  Test: lactic acid Critical Value: 2.6  Name of Provider Notified: Dr Doren Custard  Orders Received? Or Actions Taken?: Orders Received - See Orders for details and will inform primary RN

## 2022-10-02 DIAGNOSIS — K56609 Unspecified intestinal obstruction, unspecified as to partial versus complete obstruction: Secondary | ICD-10-CM | POA: Diagnosis not present

## 2022-10-02 DIAGNOSIS — R300 Dysuria: Secondary | ICD-10-CM

## 2022-10-02 DIAGNOSIS — C652 Malignant neoplasm of left renal pelvis: Secondary | ICD-10-CM | POA: Diagnosis not present

## 2022-10-02 LAB — CBC WITH DIFFERENTIAL/PLATELET
Abs Immature Granulocytes: 0.15 10*3/uL — ABNORMAL HIGH (ref 0.00–0.07)
Basophils Absolute: 0.1 10*3/uL (ref 0.0–0.1)
Basophils Relative: 1 %
Eosinophils Absolute: 0 10*3/uL (ref 0.0–0.5)
Eosinophils Relative: 0 %
HCT: 31.3 % — ABNORMAL LOW (ref 39.0–52.0)
Hemoglobin: 9.6 g/dL — ABNORMAL LOW (ref 13.0–17.0)
Immature Granulocytes: 1 %
Lymphocytes Relative: 9 %
Lymphs Abs: 1.3 10*3/uL (ref 0.7–4.0)
MCH: 19.5 pg — ABNORMAL LOW (ref 26.0–34.0)
MCHC: 30.7 g/dL (ref 30.0–36.0)
MCV: 63.6 fL — ABNORMAL LOW (ref 80.0–100.0)
Monocytes Absolute: 1.3 10*3/uL — ABNORMAL HIGH (ref 0.1–1.0)
Monocytes Relative: 8 %
Neutro Abs: 12.7 10*3/uL — ABNORMAL HIGH (ref 1.7–7.7)
Neutrophils Relative %: 81 %
Platelets: 431 10*3/uL — ABNORMAL HIGH (ref 150–400)
RBC: 4.92 MIL/uL (ref 4.22–5.81)
RDW: 17.6 % — ABNORMAL HIGH (ref 11.5–15.5)
WBC: 15.5 10*3/uL — ABNORMAL HIGH (ref 4.0–10.5)
nRBC: 0 % (ref 0.0–0.2)

## 2022-10-02 LAB — BASIC METABOLIC PANEL
Anion gap: 9 (ref 5–15)
BUN: 30 mg/dL — ABNORMAL HIGH (ref 8–23)
CO2: 26 mmol/L (ref 22–32)
Calcium: 9.2 mg/dL (ref 8.9–10.3)
Chloride: 102 mmol/L (ref 98–111)
Creatinine, Ser: 1.35 mg/dL — ABNORMAL HIGH (ref 0.61–1.24)
GFR, Estimated: 53 mL/min — ABNORMAL LOW (ref >60)
Glucose, Bld: 105 mg/dL — ABNORMAL HIGH (ref 70–99)
Potassium: 4.1 mmol/L (ref 3.5–5.1)
Sodium: 137 mmol/L (ref 135–145)

## 2022-10-02 LAB — GLUCOSE, CAPILLARY
Glucose-Capillary: 102 mg/dL — ABNORMAL HIGH (ref 70–99)
Glucose-Capillary: 110 mg/dL — ABNORMAL HIGH (ref 70–99)
Glucose-Capillary: 111 mg/dL — ABNORMAL HIGH (ref 70–99)
Glucose-Capillary: 114 mg/dL — ABNORMAL HIGH (ref 70–99)
Glucose-Capillary: 116 mg/dL — ABNORMAL HIGH (ref 70–99)
Glucose-Capillary: 120 mg/dL — ABNORMAL HIGH (ref 70–99)

## 2022-10-02 LAB — MAGNESIUM: Magnesium: 1.6 mg/dL — ABNORMAL LOW (ref 1.7–2.4)

## 2022-10-02 LAB — PHOSPHORUS: Phosphorus: 3.6 mg/dL (ref 2.5–4.6)

## 2022-10-02 MED ORDER — SODIUM CHLORIDE 0.9 % IV SOLN: 2.0000 g | Freq: Two times a day (BID) | INTRAVENOUS | Status: DC

## 2022-10-02 MED ORDER — MAGNESIUM SULFATE 2 GM/50ML IV SOLN
2.0000 g | Freq: Once | INTRAVENOUS | Status: AC
  Administered 2022-10-02: 2 g via INTRAVENOUS
  Filled 2022-10-02: qty 50

## 2022-10-02 MED ORDER — BISACODYL 10 MG RE SUPP
10.0000 mg | Freq: Every day | RECTAL | Status: DC
  Administered 2022-10-02: 10 mg via RECTAL
  Filled 2022-10-02 (×4): qty 1

## 2022-10-02 MED ORDER — SODIUM CHLORIDE 0.9 % IV SOLN
1.0000 g | Freq: Two times a day (BID) | INTRAVENOUS | Status: AC
  Administered 2022-10-02 – 2022-10-04 (×6): 1 g via INTRAVENOUS
  Filled 2022-10-02 (×6): qty 20

## 2022-10-02 MED ORDER — NAPHAZOLINE-GLYCERIN 0.012-0.25 % OP SOLN
1.0000 [drp] | Freq: Four times a day (QID) | OPHTHALMIC | Status: DC | PRN
  Administered 2022-10-02: 1 [drp] via OPHTHALMIC
  Filled 2022-10-02: qty 15

## 2022-10-02 MED ORDER — CHLORHEXIDINE GLUCONATE CLOTH 2 % EX PADS
6.0000 | MEDICATED_PAD | Freq: Every day | CUTANEOUS | Status: DC
  Administered 2022-10-02 – 2022-10-05 (×4): 6 via TOPICAL

## 2022-10-02 NOTE — Assessment & Plan Note (Addendum)
-   Patient recently completed course of Levaquin on 09/30/2022 for Klebsiella ESBL -This morning he is now complaining of recurrent burning with urination; he does likely have a component of colonization but given his symptoms and leukocytosis (although was on steroids outpatient too) along with underlying malignancy and SBO, prudent for treating for now while in hospital - start meropenem; send urine culture; UA was done on admission

## 2022-10-02 NOTE — Progress Notes (Signed)
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  He underwent left nephrectomy August 2023 with Dr. Gilford Rile.  He follows outpatient with oncology and has been treated with enfortumab and pembrolizumab.  He was recently started on Levaquin for a resistant UTI (ESBL Klebsiella) along with prednisone for some joint pains.  He completed antibiotics on 09/30/2022 and has only been on prednisone for approximately 1 week.  He did notice some improvement in his joint pains. He also has a history of prior SBO in January 2023 treated at [facility].  This did not require any surgical intervention and resolved after NG tube placement and bowel rest.  He presented to the ER due to 2 to 3-day complaints of nausea and unable to tolerate food along with epigastric abdominal pain. CT abdomen/pelvis was performed which showed multiple dilated small bowel loops in the left hemiabdomen with concern for transition point in the right lower quadrant. He underwent NG tube placement in the ER and general surgery was consulted.  He is admitted for further workup and evaluation.  Interval History:  No events overnight.  NG tube still draining.  He does note some improvement in his nausea and abdominal pain.  Assessment and Plan: * Small bowel obstruction ([place]) - hx of similar Jan 2023, treated in [place] (no surgery needed, did need NGT), stayed in hospital 4-5 days; unable to review records however - presents with ~3 days poor appetite and epigastric abdominal pain  - CT shows "multiple mildly dilated loops of small bowel in the LEFT hemiabdomen with interdigitating fat stranding and trace fluid. This is new in comparison to prior. Findings  are concerning for small bowel obstruction. Transition point is favored to be in the RIGHT lower quadrant." - NGT inserted in ER; continue to [facility]IS - general surgery following - continue NPO, pain, and nausea control  - continue IVF -Further plans TBD; I have made Dr. Delton Osborne and Dr. Alyson Osborne aware of hospitalization per patient request   Transitional cell carcinoma of renal pelvis, left ([place]) - follows with oncology; last seen 09/21/22 - has been on prednisone ~1 week for joint pains, hold steroid in setting of possible surgery - recent PET 08/03/22 showing new mets to mediastinal nodes and left supraclavicular node along with new small round pulmonary nodules -Continue treatment outpatient with oncology  Dysuria - Patient recently completed course of Levaquin on 09/30/2022 for Klebsiella ESBL -This morning he is now complaining of recurrent burning with urination; he does likely have a component of colonization but given his symptoms and leukocytosis (although was on steroids outpatient too) along with underlying malignancy and SBO, prudent for treating for now while in hospital - start meropenem; send urine culture; UA was done on admission  Chronic kidney disease, stage 3b ([place]) - patient has history of CKD3b. Baseline creat ~ 1.6, eGFR 40 - currently at baseline  Mixed hyperlipidemia - hold home meds  Controlled type 2 diabetes mellitus without complication, without long-term current use of insulin (HCC) - NPO - q4h SSI while NPO  Old records reviewed in assessment of this patient  Antimicrobials: Meropenem 12/25 >> current  DVT prophylaxis:  heparin injection 5,000 Units Start: 10/01/22 2200   Code  Status:   Code Status: Full Code  Mobility Assessment (last 72 hours)     Mobility Assessment     Row Name 10/01/22 1723 10/01/22 1600         Does patient have an order for bedrest or is patient medically unstable No - Continue assessment No - Continue assessment       What is the highest level of mobility based on the progressive mobility assessment? Level 5 (Walks with assist in room/hall) - Balance while stepping forward/back and can walk in room with assist - Complete Level 5 (Walks with assist in room/hall) - Balance while stepping forward/back and can walk in room with assist - Complete               Barriers to discharge:  Disposition Plan:  Home 3-4 days or possibly longer Status is: Inpt  Objective: Blood pressure (!) 158/76, pulse (!) 109, temperature 98.1 F (36.7 C), resp. rate 18, height 6' (1.829 m), weight 73.9 kg, SpO2 96 %.  Examination:  Physical Exam Constitutional:      General: He is not in acute distress.    Appearance: He is well-developed.  HENT:     Head: Normocephalic and atraumatic.     Mouth/Throat:     Mouth: Mucous membranes are moist.  Eyes:     Extraocular Movements: Extraocular movements intact.  Cardiovascular:     Rate and Rhythm: Normal rate and regular rhythm.  Pulmonary:     Effort: Pulmonary effort is normal. No respiratory distress.     Breath sounds: Normal breath sounds. No wheezing.  Abdominal:     Comments: More so having tenderness now in the lower quadrants compared to epigastrium previously.  Bowel sounds are present in all 4 quadrants but some tinkling now being appreciated  Musculoskeletal:        General: Normal range of motion.     Cervical back: Normal range of motion and neck supple.  Skin:    General: Skin is warm and dry.  Neurological:     General: No focal deficit present.     Mental Status: He is alert.  Psychiatric:        Mood and Affect: Mood normal.        Behavior: Behavior normal.      Consultants:  General surgery  Procedures:    Data Reviewed: Results for orders placed or performed during the hospital encounter of 10/01/22 (from the past 24 hour(s))  Lactic acid, plasma     Status: None   Collection Time: 10/01/22 12:52 PM  Result Value Ref Range   Lactic  Acid, Venous 1.6 0.5 - 1.9 mmol/L  Glucose, capillary     Status: Abnormal   Collection Time: 10/01/22  4:37 PM  Result Value Ref Range   Glucose-Capillary 128 (H) 70 - 99 mg/dL  Glucose, capillary     Status: Abnormal   Collection Time: 10/01/22  9:02 PM  Result Value Ref Range   Glucose-Capillary 125 (H) 70 - 99 mg/dL  Glucose, capillary     Status: Abnormal   Collection Time: 10/01/22 11:42 PM  Result Value Ref Range   Glucose-Capillary 117 (H) 70 - 99 mg/dL  Glucose, capillary     Status: Abnormal   Collection Time: 10/02/22  3:22 AM  Result Value Ref Range   Glucose-Capillary 120 (H) 70 - 99 mg/dL  Basic metabolic panel     Status: Abnormal   Collection Time: 10/02/22  4:53 AM  Result  Value Ref Range   Sodium 137 135 - 145 mmol/L   Potassium 4.1 3.5 - 5.1 mmol/L   Chloride 102 98 - 111 mmol/L   CO2 26 22 - 32 mmol/L   Glucose, Bld 105 (H) 70 - 99 mg/dL   BUN 30 (H) 8 - 23 mg/dL   Creatinine, Ser 1.35 (H) 0.61 - 1.24 mg/dL   Calcium 9.2 8.9 - 10.3 mg/dL   GFR, Estimated 53 (L) >60 mL/min   Anion gap 9 5 - 15  CBC with Differential/Platelet     Status: Abnormal   Collection Time: 10/02/22  4:53 AM  Result Value Ref Range   WBC 15.5 (H) 4.0 - 10.5 K/uL   RBC 4.92 4.22 - 5.81 MIL/uL   Hemoglobin 9.6 (L) 13.0 - 17.0 g/dL   HCT 31.3 (L) 39.0 - 52.0 %   MCV 63.6 (L) 80.0 - 100.0 fL   MCH 19.5 (L) 26.0 - 34.0 pg   MCHC 30.7 30.0 - 36.0 g/dL   RDW 17.6 (H) 11.5 - 15.5 %   Platelets 431 (H) 150 - 400 K/uL   nRBC 0.0 0.0 - 0.2 %   Neutrophils Relative % 81 %   Neutro Abs 12.7 (H) 1.7 - 7.7 K/uL   Lymphocytes Relative 9 %   Lymphs Abs 1.3 0.7 - 4.0 K/uL   Monocytes Relative 8 %   Monocytes Absolute 1.3 (H) 0.1 - 1.0 K/uL   Eosinophils Relative 0 %   Eosinophils Absolute 0.0 0.0 - 0.5 K/uL   Basophils Relative 1 %   Basophils Absolute 0.1 0.0 - 0.1 K/uL   WBC Morphology MORPHOLOGY UNREMARKABLE    Smear Review MORPHOLOGY UNREMARKABLE    Immature Granulocytes 1 %   Abs  Immature Granulocytes 0.15 (H) 0.00 - 0.07 K/uL   Spherocytes PRESENT   Phosphorus     Status: None   Collection Time: 10/02/22  4:53 AM  Result Value Ref Range   Phosphorus 3.6 2.5 - 4.6 mg/dL  Magnesium     Status: Abnormal   Collection Time: 10/02/22  4:53 AM  Result Value Ref Range   Magnesium 1.6 (L) 1.7 - 2.4 mg/dL  Glucose, capillary     Status: Abnormal   Collection Time: 10/02/22  7:32 AM  Result Value Ref Range   Glucose-Capillary 111 (H) 70 - 99 mg/dL  Glucose, capillary     Status: Abnormal   Collection Time: 10/02/22 11:13 AM  Result Value Ref Range   Glucose-Capillary 114 (H) 70 - 99 mg/dL    I have Reviewed nursing notes, Vitals, and Lab results since pt's last encounter. Pertinent lab results : see above I have ordered labwork to follow up on.  I have reviewed the last note from staff over past 24 hours I have discussed pt's care plan and test results with nursing staff, CM/SW, and other staff as appropriate  Time spent: Greater than 50% of the 55 minute visit was spent in counseling/coordination of care for the patient as laid out in the A&P.   LOS: 1 day   Dwyane Dee, MD [facility] 10/02/2022, 11:32 AM

## 2022-10-02 NOTE — Consult Note (Signed)
[place]  Reason for Consult: Small bowel obstruction Referring Physician: Dr. Doren Custard  Chief Complaint   Abdominal Pain     HPI: Joshua Osborne is a 81 y.o. male who presents with a 3-day history of abdominal pain and nausea without bowel movement.  He does have a history of small bowel obstruction in the past, for which he was able to be managed conservatively.  This is similar in nature to that obstruction.  He confirms nausea without any episodes of emesis.  He last passed flatus 2 to 3 days ago, and his last bowel movement was 3 days ago.  He was recently being treated for a multidrug-resistant UTI, and just finished his last doses of Levaquin.  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  He recently underwent cystoscopy with Dr. Alyson Ingles with multiple tumors removed earlier this month.  His surgical history is significant for cholecystectomy and robotic assisted laparoscopic nephroureterectomy.  He denies use of blood thinning medications.  In the ED, he underwent a CT abdomen and pelvis, which demonstrated multiple mildly dilated loops of small bowel in the left Hemi abdomen, concerning for small bowel obstruction with transition point favored to be in the right lower quadrant in close proximity to the bladder; small volume free fluid in the pelvis, and difficult to evaluate for peritoneal nodularity without IV contrast.  He was noted to have a leukocytosis of 19.  NG tube was placed, and he has had 750 cc of bilious output in the last 24 hours.  He feels that his abdomen is slightly improved, though he still has lower pelvic pain in close proximity to his bladder.  He denies any nausea at this time.  Past Medical History:  Diagnosis Date   Anemia    Anxiety    Arthritis    Bladder tumor    Cancer ([place])    hx of skin cancer, renal cancer   Diabetes mellitus without complication (HCC)    GERD (gastroesophageal reflux  disease)    Heart murmur    as a child   History of kidney stones    Port-A-Cath in place 08/17/2022   Sleep apnea    cpap    Past Surgical History:  Procedure Laterality Date   bilateral dupreyen contracture surgery      CHOLECYSTECTOMY     CYSTOSCOPY N/A 05/30/2022   Procedure: CYSTOSCOPY WITH TRANSURETHRAL RESECTION OF THE LEFT URETERAL ORIFICE,  TRANSURETHRAL RESECTION OF BLADDER TUMOR;  Surgeon: Ceasar Mons, MD;  Location: [facility];  Service: Urology;  Laterality: N/A;   CYSTOSCOPY N/A 09/06/2022   Procedure: CYSTOSCOPY;  Surgeon: Cleon Gustin, MD;  Location: [facility];  Service: Urology;  Laterality: N/A;   CYSTOSCOPY WITH RETROGRADE PYELOGRAM, URETEROSCOPY AND STENT PLACEMENT Left 04/26/2022   Procedure: CYSTOSCOPY WITH RETROGRADE PYELOGRAM, URETEROSCOPY AND STENT PLACEMENT;  Surgeon: Cleon Gustin, MD;  Location: [facility];  Service: Urology;  Laterality: Left;   IR IMAGING GUIDED PORT INSERTION  08/16/2022   LIVER BIOPSY     ROBOT ASSITED LAPAROSCOPIC NEPHROURETERECTOMY Left 05/30/2022   Procedure: XI ROBOT ASSITED LAPAROSCOPIC NEPHROURETERECTOMY;  Surgeon: Ceasar Mons, MD;  Location: [facility];  Service: Urology;  Laterality: Left;   TRANSURETHRAL RESECTION OF BLADDER TUMOR  04/26/2022   Procedure: TRANSURETHRAL RESECTION OF BLADDER TUMOR (TURBT);  Surgeon: Cleon Gustin, MD;  Location: [facility];  Service: Urology;;   TRANSURETHRAL RESECTION OF BLADDER TUMOR N/A 09/06/2022   Procedure: TRANSURETHRAL RESECTION  OF BLADDER TUMOR (TURBT);  Surgeon: Cleon Gustin, MD;  Location: [facility];  Service: Urology;  Laterality: N/A;    No family history on file.  Social History   Tobacco Use   Smoking status: Former    Types: Cigarettes   Smokeless tobacco: Never  Vaping Use   Vaping Use: Never used  Substance Use Topics   Alcohol use: Never   Drug use: Never    Medications: I have reviewed the patient's current medications.  No Known  Allergies   ROS:  Pertinent items are noted in HPI.  Blood pressure (!) 158/76, pulse (!) 109, temperature 98.1 F (36.7 C), resp. rate 18, height 6' (1.829 m), weight 73.9 kg, SpO2 96 %. Physical Exam Vitals reviewed.  Constitutional:      Appearance: He is well-developed.  HENT:     Head: Normocephalic and atraumatic.     Nose:     Comments: NG tube in place with bilious output in canister Eyes:     Extraocular Movements: Extraocular movements intact.     Pupils: Pupils are equal, round, and reactive to light.  Cardiovascular:     Rate and Rhythm: Normal rate.  Pulmonary:     Effort: Pulmonary effort is normal.  Abdominal:     Comments: Abdomen soft, nondistended, no percussion tenderness, mild tenderness to palpation in the suprapubic area; no rigidity, guarding, rebound tenderness  Skin:    General: Skin is warm and dry.  Neurological:     General: No focal deficit present.     Mental Status: He is alert and oriented to person, place, and time.  Psychiatric:        Mood and Affect: Mood normal.        Behavior: Behavior normal.     Results: Results for orders placed or performed during the hospital encounter of 10/01/22 (from the past 48 hour(s))  Comprehensive metabolic panel     Status: Abnormal   Collection Time: 10/01/22  9:44 AM  Result Value Ref Range   Sodium 136 135 - 145 mmol/L   Potassium 4.6 3.5 - 5.1 mmol/L   Chloride 98 98 - 111 mmol/L   CO2 26 22 - 32 mmol/L   Glucose, Bld 146 (H) 70 - 99 mg/dL    Comment: Glucose reference range applies only to samples taken after fasting for at least 8 hours.   BUN 37 (H) 8 - 23 mg/dL   Creatinine, Ser 1.72 (H) 0.61 - 1.24 mg/dL   Calcium 9.9 8.9 - 10.3 mg/dL   Total Protein 8.8 (H) 6.5 - 8.1 g/dL   Albumin 4.3 3.5 - 5.0 g/dL   AST 22 15 - 41 U/L   ALT 15 0 - 44 U/L   Alkaline Phosphatase 81 38 - 126 U/L   Total Bilirubin 0.8 0.3 - 1.2 mg/dL   GFR, Estimated 40 (L) >60 mL/min    Comment: (NOTE) Calculated  using the CKD-EPI Creatinine Equation (2021)    Anion gap 12 5 - 15    Comment: Performed at [facility], [address], [place], [place] [address]  Lipase, blood     Status: None   Collection Time: 10/01/22  9:44 AM  Result Value Ref Range   Lipase 37 11 - 51 U/L    Comment: Performed at [facility], [address], [place], [place] [address]  CBC with Diff     Status: Abnormal   Collection Time: 10/01/22  9:44 AM  Result Value Ref Range  WBC 19.0 (H) 4.0 - 10.5 K/uL   RBC 5.72 4.22 - 5.81 MIL/uL   Hemoglobin 11.3 (L) 13.0 - 17.0 g/dL   HCT 36.3 (L) 39.0 - 52.0 %   MCV 63.5 (L) 80.0 - 100.0 fL   MCH 19.8 (L) 26.0 - 34.0 pg   MCHC 31.1 30.0 - 36.0 g/dL   RDW 19.1 (H) 11.5 - 15.5 %   Platelets 547 (H) 150 - 400 K/uL   nRBC 0.0 0.0 - 0.2 %   Neutrophils Relative % 86 %   Neutro Abs 16.4 (H) 1.7 - 7.7 K/uL   Lymphocytes Relative 5 %   Lymphs Abs 1.0 0.7 - 4.0 K/uL   Monocytes Relative 7 %   Monocytes Absolute 1.3 (H) 0.1 - 1.0 K/uL   Eosinophils Relative 0 %   Eosinophils Absolute 0.0 0.0 - 0.5 K/uL   Basophils Relative 1 %   Basophils Absolute 0.1 0.0 - 0.1 K/uL   WBC Morphology TOXIC GRANULATION    Smear Review MORPHOLOGY UNREMARKABLE    Immature Granulocytes 1 %   Abs Immature Granulocytes 0.18 (H) 0.00 - 0.07 K/uL   Spherocytes PRESENT     Comment: Performed at [facility], [address] [place], [place] [address]  Magnesium     Status: None   Collection Time: 10/01/22  9:44 AM  Result Value Ref Range   Magnesium 1.8 1.7 - 2.4 mg/dL    Comment: Performed at [facility], [address] [place], [place] [address]  Troponin I (High Sensitivity)     Status: None   Collection Time: 10/01/22  9:44 AM  Result Value Ref Range   Troponin I (High Sensitivity) 5 <18 ng/L    Comment: (NOTE) Elevated high sensitivity troponin I (hsTnI) values and significant  changes across serial measurements may suggest ACS but many other  chronic and acute conditions are known to elevate  hsTnI results.  Refer to the "Links" section for chest pain algorithms and additional  guidance. Performed at [facility], [address], [place], [place] [address]   Lactic acid, plasma     Status: Abnormal   Collection Time: 10/01/22 10:04 AM  Result Value Ref Range   Lactic Acid, Venous 2.6 (HH) 0.5 - 1.9 mmol/L    Comment: CRITICAL RESULT CALLED TO, READ BACK BY AND VERIFIED WITH: BLACKBURN C AT 1051 ON [id number] BY THOMPSON S. Performed at [facility], [address], [place], [place] [address]   Urinalysis, Routine w reflex microscopic Urine, Clean Catch     Status: Abnormal   Collection Time: 10/01/22 10:44 AM  Result Value Ref Range   Color, Urine YELLOW YELLOW   APPearance HAZY (A) CLEAR   Specific Gravity, Urine 1.015 1.005 - 1.030   pH 6.0 5.0 - 8.0   Glucose, UA NEGATIVE NEGATIVE mg/dL   Hgb urine dipstick NEGATIVE NEGATIVE   Bilirubin Urine NEGATIVE NEGATIVE   Ketones, ur NEGATIVE NEGATIVE mg/dL   Protein, ur 30 (A) NEGATIVE mg/dL   Nitrite NEGATIVE NEGATIVE   Leukocytes,Ua LARGE (A) NEGATIVE   RBC / HPF 6-10 0 - 5 RBC/hpf   WBC, UA >50 (H) 0 - 5 WBC/hpf   Bacteria, UA NONE SEEN NONE SEEN   Squamous Epithelial / LPF 0-5 0 - 5    Comment: Performed at [facility], [address] [place], [place] [address]  Troponin I (High Sensitivity)     Status: None   Collection Time: 10/01/22 11:24 AM  Result Value Ref Range  Troponin I (High Sensitivity) 6 <18 ng/L    Comment: (NOTE) Elevated high sensitivity troponin I (hsTnI) values and significant  changes across serial measurements may suggest ACS but many other  chronic and acute conditions are known to elevate hsTnI results.  Refer to the "Links" section for chest pain algorithms and additional  guidance. Performed at [facility], [address], [place], [place] [address]   Lactic acid, plasma     Status: None   Collection Time: 10/01/22 12:52 PM  Result Value Ref Range   Lactic Acid, Venous 1.6 0.5 - 1.9 mmol/L     Comment: Performed at [facility], [address], [place], [place] [address]  Glucose, capillary     Status: Abnormal   Collection Time: 10/01/22  4:37 PM  Result Value Ref Range   Glucose-Capillary 128 (H) 70 - 99 mg/dL    Comment: Glucose reference range applies only to samples taken after fasting for at least 8 hours.  Glucose, capillary     Status: Abnormal   Collection Time: 10/01/22  9:02 PM  Result Value Ref Range   Glucose-Capillary 125 (H) 70 - 99 mg/dL    Comment: Glucose reference range applies only to samples taken after fasting for at least 8 hours.  Glucose, capillary     Status: Abnormal   Collection Time: 10/01/22 11:42 PM  Result Value Ref Range   Glucose-Capillary 117 (H) 70 - 99 mg/dL    Comment: Glucose reference range applies only to samples taken after fasting for at least 8 hours.  Glucose, capillary     Status: Abnormal   Collection Time: 10/02/22  3:22 AM  Result Value Ref Range   Glucose-Capillary 120 (H) 70 - 99 mg/dL    Comment: Glucose reference range applies only to samples taken after fasting for at least 8 hours.  Basic metabolic panel     Status: Abnormal   Collection Time: 10/02/22  4:53 AM  Result Value Ref Range   Sodium 137 135 - 145 mmol/L   Potassium 4.1 3.5 - 5.1 mmol/L   Chloride 102 98 - 111 mmol/L   CO2 26 22 - 32 mmol/L   Glucose, Bld 105 (H) 70 - 99 mg/dL    Comment: Glucose reference range applies only to samples taken after fasting for at least 8 hours.   BUN 30 (H) 8 - 23 mg/dL   Creatinine, Ser 1.35 (H) 0.61 - 1.24 mg/dL   Calcium 9.2 8.9 - 10.3 mg/dL   GFR, Estimated 53 (L) >60 mL/min    Comment: (NOTE) Calculated using the CKD-EPI Creatinine Equation (2021)    Anion gap 9 5 - 15    Comment: Performed at [facility], [address], [place], [place] [address]  CBC with Differential/Platelet     Status: Abnormal   Collection Time: 10/02/22  4:53 AM  Result Value Ref Range   WBC 15.5 (H) 4.0 - 10.5 K/uL   RBC 4.92 4.22 -  5.81 MIL/uL   Hemoglobin 9.6 (L) 13.0 - 17.0 g/dL   HCT 31.3 (L) 39.0 - 52.0 %   MCV 63.6 (L) 80.0 - 100.0 fL   MCH 19.5 (L) 26.0 - 34.0 pg   MCHC 30.7 30.0 - 36.0 g/dL   RDW 17.6 (H) 11.5 - 15.5 %   Platelets 431 (H) 150 - 400 K/uL   nRBC 0.0 0.0 - 0.2 %   Neutrophils Relative % 81 %   Neutro Abs 12.7 (H) 1.7 - 7.7 K/uL  Lymphocytes Relative 9 %   Lymphs Abs 1.3 0.7 - 4.0 K/uL   Monocytes Relative 8 %   Monocytes Absolute 1.3 (H) 0.1 - 1.0 K/uL   Eosinophils Relative 0 %   Eosinophils Absolute 0.0 0.0 - 0.5 K/uL   Basophils Relative 1 %   Basophils Absolute 0.1 0.0 - 0.1 K/uL   WBC Morphology MORPHOLOGY UNREMARKABLE    Smear Review MORPHOLOGY UNREMARKABLE    Immature Granulocytes 1 %   Abs Immature Granulocytes 0.15 (H) 0.00 - 0.07 K/uL   Spherocytes PRESENT     Comment: Performed at [facility], [address], [place], [place] [address]  Phosphorus     Status: None   Collection Time: 10/02/22  4:53 AM  Result Value Ref Range   Phosphorus 3.6 2.5 - 4.6 mg/dL    Comment: Performed at [facility], [address], [place], [place] [address]  Magnesium     Status: Abnormal   Collection Time: 10/02/22  4:53 AM  Result Value Ref Range   Magnesium 1.6 (L) 1.7 - 2.4 mg/dL    Comment: Performed at [facility], [address], [place], [place] [address]  Glucose, capillary     Status: Abnormal   Collection Time: 10/02/22  7:32 AM  Result Value Ref Range   Glucose-Capillary 111 (H) 70 - 99 mg/dL    Comment: Glucose reference range applies only to samples taken after fasting for at least 8 hours.    DG Abd Portable 1 View  Result Date: 10/01/2022 CLINICAL DATA:  Evaluate NG placement EXAM: PORTABLE ABDOMEN - 1 VIEW COMPARISON:  Earlier today FINDINGS: There is a enteric tube with tip and side port below the GE junction within the gastric fundus. Mildly dilated loops of small bowel are noted in the left hemiabdomen. IMPRESSION: Enteric tube tip and side port below the GE junction  within the gastric fundus. Electronically Signed   By: Kerby Moors M.D.   On: 10/01/2022 13:58   DG Abd Portable 1 View  Result Date: 10/01/2022 CLINICAL DATA:  NG tube placement. EXAM: PORTABLE ABDOMEN - 1 VIEW COMPARISON:  None Available. FINDINGS: NG tube tip is in the mid stomach with proximal side port at or just below the GE junction. Mild gaseous small bowel distension noted in the left abdomen. IMPRESSION: NG tube tip is in the mid stomach with proximal side port at or just below the GE junction. NG tube could be advanced approximately 3 cm to ensure side port is below the GE junction. Electronically Signed   By: Misty Stanley M.D.   On: 10/01/2022 13:10   CT ABDOMEN PELVIS WO CONTRAST  Result Date: 10/01/2022 CLINICAL DATA:  Abdominal pain, acute, nonlocalized. History of metastatic bladder cancer. 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 EXAM: CT ABDOMEN AND PELVIS WITHOUT CONTRAST TECHNIQUE: Multidetector CT imaging of the abdomen and pelvis was performed following the standard protocol without IV contrast. RADIATION DOSE REDUCTION: This exam was performed according to the departmental dose-optimization program which includes automated exposure control, adjustment of the mA and/or kV according to patient size and/or use of iterative reconstruction technique. * Tracking Code: BO * COMPARISON:  September 20, 2022; August 03 2022, April 25, 2022 FINDINGS: Evaluation is limited by lack of IV contrast. Lower chest: Revisualization of multiple pulmonary nodules. Representative RIGHT lower lobe pulmonary nodule measures 8 mm, not significantly changed in comparison to most recent prior (series 4, image 16). Additional representative RIGHT middle lobe subpleural pulmonary nodule measures 11  by 7 mm, similar dating back to October 2023 given differences in technique but newly conspicuous since July (series 4, image 9). Hepatobiliary: Multiple coarse calcifications of the  liver. Status post cholecystectomy. Similar dilation of the common bile duct to approximately 10 mm, consistent with post cholecystectomy state. Pancreas: No peripancreatic fat stranding. Spleen: Unremarkable. Adrenals/Urinary Tract: Adrenal glands are unremarkable. Status post LEFT nephrectomy. No new nodularity identified within the LEFT nephrectomy bed. No RIGHT-sided hydronephrosis. No obstructing RIGHT-sided nephrolithiasis. Bladder is mildly distended. Stomach/Bowel: There are multiple mildly dilated loops of small bowel in the LEFT hemiabdomen. Representative loopd of bowel measures 3.8 cm in diameter. There is interdigitating fat stranding and trace fluid. No definitive pneumatosis. Transition point is favored to be in the RIGHT lower quadrant loop of small bowel (series 5, image 60; series 2, image 49). Diverticulosis throughout the sigmoid colon. Moderate predominately RIGHT-sided colonic stool burden. Appendix is normal. Vascular/Lymphatic: Atherosclerotic calcifications of the nonaneurysmal abdominal aorta. LEFT periaortic lymph node measuring 1.1 cm in the short axis is similar in appearance in comparison to prior (series 2, image 35). Additional ill-defined LEFT periaortic lymph node measures 10 mm in the short axis, unchanged (series 2, image 32). Reproductive: Prostatomegaly. Other: Small volume free fluid in the pelvis. Musculoskeletal: Degenerative changes of the lumbar spine. No new aggressive osseous lesions identified. Grade 1 anterolisthesis of L4-5. IMPRESSION: 1. There are multiple mildly dilated loops of small bowel in the LEFT hemiabdomen with interdigitating fat stranding and trace fluid. This is new in comparison to prior. Findings are concerning for small bowel obstruction. Transition point is favored to be in the RIGHT lower quadrant. 2. Small volume free fluid in the pelvis, nonspecific and possibly reactive. Evaluation for peritoneal nodularity is limited by lack of IV contrast. 3.  Revisualization of multiple pulmonary nodules, most consistent with metastatic disease. 4. Similar appearance of retroperitoneal adenopathy. Aortic Atherosclerosis (ICD10-I70.0). Electronically Signed   By: Valentino Saxon M.D.   On: 10/01/2022 10:39     Assessment & Plan:  Suleiman Finigan is a 81 y.o. male who was admitted with concern for small bowel obstruction.  Imaging and blood work evaluated by myself.  -I discussed with the patient and his family our intended plan of nonoperative management for his bowel obstruction.  We went through the imaging, and I pointed out the location that was concerning for a transition point.  This is in close proximity to his bladder, and without IV contrast, it is difficult to say whether this is just secondary to scar tissue, or if this is one of his bladder tumor is invading through the bladder wall and into the small intestine -I will review this imaging with radiology tomorrow to get their thoughts -We further discussed that we we will attempt a small bowel obstruction protocol, likely tomorrow, to see if this obstruction has alleviated itself.  I did explain that if he fails to improve with conservative measures, he may require surgical intervention.  We also discussed that if this obstruction point is thought to be related to his bladder cancer, there is a chance that surgery will not be able to alleviate his obstruction even with surgery, and he would need palliative care at that point -Patient's family has requested that we make Dr. Alyson Ingles and Dr. Delton Coombes aware of his admission to the hospital -NPO -NG to LIS -IV fluids per primary team -Dulcolax suppository -PRN pain control and antiemetics -Mild improvement in leukocytosis today, 15.5 from 19 -Urine culture obtained, currently  on meropenem -Appreciate hospitalist recommendations  All questions were answered to the satisfaction of the patient and family.  -- Graciella Freer,  DO [facility] [address] [place],  [id number] [phone] (office)

## 2022-10-02 NOTE — Progress Notes (Signed)
Placed 14 fr foley catheter per MD order. Lacie, NT was present during insertion. Pt tolerated well.

## 2022-10-02 NOTE — Progress Notes (Signed)
Accessed chest port. Patient tolerated well.

## 2022-10-02 NOTE — Progress Notes (Signed)
Pt c/o pain when urinating. Pt feels the urge to urinate every 15-20 minutes with only dribbles. MD made aware. Ordered to bladder scan, showing >200. MD ordered foley.

## 2022-10-03 ENCOUNTER — Inpatient Hospital Stay (HOSPITAL_COMMUNITY): Payer: MEDICARE

## 2022-10-03 ENCOUNTER — Encounter (HOSPITAL_COMMUNITY): Payer: Self-pay | Admitting: Anesthesiology

## 2022-10-03 DIAGNOSIS — R300 Dysuria: Secondary | ICD-10-CM | POA: Diagnosis not present

## 2022-10-03 DIAGNOSIS — M542 Cervicalgia: Secondary | ICD-10-CM

## 2022-10-03 DIAGNOSIS — R338 Other retention of urine: Secondary | ICD-10-CM | POA: Diagnosis not present

## 2022-10-03 DIAGNOSIS — K56609 Unspecified intestinal obstruction, unspecified as to partial versus complete obstruction: Secondary | ICD-10-CM | POA: Diagnosis not present

## 2022-10-03 DIAGNOSIS — C652 Malignant neoplasm of left renal pelvis: Secondary | ICD-10-CM | POA: Diagnosis not present

## 2022-10-03 LAB — GLUCOSE, CAPILLARY
Glucose-Capillary: 105 mg/dL — ABNORMAL HIGH (ref 70–99)
Glucose-Capillary: 105 mg/dL — ABNORMAL HIGH (ref 70–99)
Glucose-Capillary: 108 mg/dL — ABNORMAL HIGH (ref 70–99)
Glucose-Capillary: 91 mg/dL (ref 70–99)
Glucose-Capillary: 96 mg/dL (ref 70–99)
Glucose-Capillary: 98 mg/dL (ref 70–99)

## 2022-10-03 LAB — CBC WITH DIFFERENTIAL/PLATELET
Abs Immature Granulocytes: 0.1 10*3/uL — ABNORMAL HIGH (ref 0.00–0.07)
Basophils Absolute: 0.1 10*3/uL (ref 0.0–0.1)
Basophils Relative: 1 %
Eosinophils Absolute: 0.1 10*3/uL (ref 0.0–0.5)
Eosinophils Relative: 0 %
HCT: 29.3 % — ABNORMAL LOW (ref 39.0–52.0)
Hemoglobin: 9.1 g/dL — ABNORMAL LOW (ref 13.0–17.0)
Immature Granulocytes: 1 %
Lymphocytes Relative: 8 %
Lymphs Abs: 1.2 10*3/uL (ref 0.7–4.0)
MCH: 19.7 pg — ABNORMAL LOW (ref 26.0–34.0)
MCHC: 31.1 g/dL (ref 30.0–36.0)
MCV: 63.6 fL — ABNORMAL LOW (ref 80.0–100.0)
Monocytes Absolute: 1.3 10*3/uL — ABNORMAL HIGH (ref 0.1–1.0)
Monocytes Relative: 9 %
Neutro Abs: 11.9 10*3/uL — ABNORMAL HIGH (ref 1.7–7.7)
Neutrophils Relative %: 81 %
Platelets: 392 10*3/uL (ref 150–400)
RBC: 4.61 MIL/uL (ref 4.22–5.81)
RDW: 17.2 % — ABNORMAL HIGH (ref 11.5–15.5)
WBC: 14.5 10*3/uL — ABNORMAL HIGH (ref 4.0–10.5)
nRBC: 0 % (ref 0.0–0.2)

## 2022-10-03 LAB — PHOSPHORUS: Phosphorus: 3.5 mg/dL (ref 2.5–4.6)

## 2022-10-03 LAB — BASIC METABOLIC PANEL
Anion gap: 12 (ref 5–15)
BUN: 27 mg/dL — ABNORMAL HIGH (ref 8–23)
CO2: 25 mmol/L (ref 22–32)
Calcium: 8.8 mg/dL — ABNORMAL LOW (ref 8.9–10.3)
Chloride: 101 mmol/L (ref 98–111)
Creatinine, Ser: 1.24 mg/dL (ref 0.61–1.24)
GFR, Estimated: 59 mL/min — ABNORMAL LOW (ref >60)
Glucose, Bld: 94 mg/dL (ref 70–99)
Potassium: 3.8 mmol/L (ref 3.5–5.1)
Sodium: 138 mmol/L (ref 135–145)

## 2022-10-03 LAB — URINE CULTURE: Culture: NO GROWTH

## 2022-10-03 LAB — MAGNESIUM: Magnesium: 2 mg/dL (ref 1.7–2.4)

## 2022-10-03 MED ORDER — PROCHLORPERAZINE EDISYLATE 10 MG/2ML IJ SOLN
10.0000 mg | Freq: Four times a day (QID) | INTRAMUSCULAR | Status: DC | PRN
  Administered 2022-10-03 (×2): 10 mg via INTRAVENOUS
  Filled 2022-10-03 (×2): qty 2

## 2022-10-03 MED ORDER — DIATRIZOATE MEGLUMINE & SODIUM 66-10 % PO SOLN
90.0000 mL | Freq: Once | ORAL | Status: AC
  Administered 2022-10-03: 90 mL via NASOGASTRIC
  Filled 2022-10-03: qty 90

## 2022-10-03 MED ORDER — DIATRIZOATE MEGLUMINE & SODIUM 66-10 % PO SOLN
ORAL | Status: AC
  Filled 2022-10-03: qty 90

## 2022-10-03 NOTE — TOC Initial Note (Signed)
Transition of Care [facility]) - Initial/Assessment Note    Patient Details  Name: Joshua Osborne MRN: [id number] Date of Birth: [birth year]  Transition of [facility]) CM/SW Contact:    Shade Flood, LCSW Phone Number: 10/03/2022, 10:56 AM  Clinical Narrative:                  Pt admitted from home. Pt independent in ADLs at baseline. Pt able to get to appointments and obtain medications as needed.   Anticipating dc Thursday at the earliest per MD.  TOC will follow and assist as needed.  Expected Discharge Plan: Home/Self Care Barriers to Discharge: Continued Medical Work up   Patient Goals and CMS Choice Patient states their goals for this hospitalization and ongoing recovery are:: go home          Expected Discharge Plan and Services In-house Referral: Clinical Social Work     Living arrangements for the past 2 months: Single Family Home                                      Prior Living Arrangements/Services Living arrangements for the past 2 months: [place] Lives with:: Spouse   Do you feel safe going back to the place where you live?: Yes               Activities of Daily Living Home Assistive Devices/Equipment: Bedside commode/3-in-1, Walker (specify type) ADL Screening (condition at time of admission) Patient's cognitive ability adequate to safely complete daily activities?: Yes Is the patient deaf or have difficulty hearing?: Yes Does the patient have difficulty seeing, even when wearing glasses/contacts?: No Does the patient have difficulty concentrating, remembering, or making decisions?: No Patient able to express need for assistance with ADLs?: Yes Does the patient have difficulty dressing or bathing?: No Independently performs ADLs?: Yes (appropriate for developmental age) Does the patient have difficulty walking or climbing stairs?: No Weakness of Legs: None Weakness of Arms/Hands: None  Permission Sought/Granted                   Emotional Assessment       Orientation: : Oriented to Self, Oriented to Place, Oriented to  Time, Oriented to Situation Alcohol / Substance Use: Not Applicable Psych Involvement: No (comment)  Admission diagnosis:  Small bowel obstruction ([place]) [K56.609] Patient Active Problem List   Diagnosis Date Noted   Dysuria 10/02/2022   Small bowel obstruction ([place]) 10/01/2022   Chronic kidney disease, stage 3b ([place]) 10/01/2022   Port-A-Cath in place 08/17/2022   Urothelial carcinoma of kidney, left ([place]) 05/30/2022   Acute respiratory failure with hypoxia ([place]) 04/24/2022   AKI (acute kidney injury) ([place]) 04/23/2022   Hypomagnesemia 04/23/2022   Cancer related pain 04/22/2022   Septic shock - RESOLVED 04/21/2022   Acute pyelonephritis 04/21/2022   Controlled type 2 diabetes mellitus without complication, without long-term current use of insulin ([place]) 04/21/2022   Mixed hyperlipidemia 04/21/2022   Transitional cell carcinoma of renal pelvis, 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   Leukocytosis 12/16/2021   Symptomatic anemia 12/16/2021   PCP:  Earney Mallet, MD Pharmacy:   [id number] [place] [address]Phone: [phone]: [phone] [address]  [place] [address] Phone: [phone] Fax: [phone]     Social Determinants of Health (SDOH) Social History: SDOH Screenings   Food Insecurity: No Food Insecurity (10/01/2022)  Housing: Low Risk  (10/01/2022)  Transportation Needs: No Transportation Needs (10/01/2022)  Utilities: Not At Risk (10/01/2022)  Tobacco Use: Medium Risk (09/20/2022)   SDOH Interventions: Food Insecurity Interventions: Intervention Not Indicated Housing Interventions: Intervention Not Indicated Transportation  Interventions: Intervention Not Indicated Utilities Interventions: Intervention Not Indicated   Readmission Risk Interventions    10/03/2022   10:56 AM  Readmission Risk Prevention Plan  Transportation Screening Complete  Medication Review Press photographer) Complete  HRI or [place] Complete  SW Recovery Care/Counseling Consult Complete  Palliative Care Screening Not [place] Not Applicable

## 2022-10-03 NOTE — Assessment & Plan Note (Signed)
-   worsening retention and dysuria on 12/25 - elevated PVR; foley inserted and patient had relief of symptoms - urine culture obtained prior to foley insertion; remove as able

## 2022-10-03 NOTE — Assessment & Plan Note (Addendum)
-   Patient has pain and tenderness over the right trapezius muscle.  No vertebral spinal tenderness.  Also has no neuropathic findings including right upper extremity numbness, tingling, weakness - Suspect muscle pain/strain from being in bed however patient also endorses was told could be a side effect of his cancer treatment - Patient requesting imaging studies to evaluate - Reasonable to obtain x-ray.  Cervical spine x-ray showed mild degenerative disc disease at C6-7 and facet arthropathy most advanced at C2-3.  No acute findings - Continue supportive care; can try heating pad too

## 2022-10-03 NOTE — Progress Notes (Signed)
[facility] Progress Note     Subjective: Patient seen and examined.  He is resting comfortably in bed.  He has had some improvement in his abdominal pain, but he still does not like having the NG tube in place.  He had a good sized bowel movement yesterday, and was passing flatus both before and after the bowel movement.  NG tube remains in place with 750 cc of bilious output in the last 24 hours.  Foley catheter was placed yesterday secondary to urinary urgency and urinary retention.  Objective: Vital signs in last 24 hours: Temp:  [98.1 F (36.7 C)-98.6 F (37 C)] 98.1 F (36.7 C) (12/26 0415) Pulse Rate:  [98-101] 98 (12/26 0415) Resp:  [12-14] 12 (12/26 0415) BP: (126-140)/(74-80) 130/74 (12/26 0415) SpO2:  [95 %-97 %] 95 % (12/26 0415) Last BM Date : 10/03/22  Intake/Output from previous day: 12/25 0701 - 12/26 0700 In: 506.2 [I.V.:406.2; IV Piggyback:100] Out: 2551 [Urine:1800; Emesis/NG output:750; Stool:1] Intake/Output this shift: Total I/O In: 100 [IV Piggyback:100] Out: -   General appearance: alert, cooperative, and no distress GI: Abdomen soft, nondistended, no percussion tenderness, minimal suprapubic tenderness to palpation; no rigidity, guarding, rebound tenderness  Lab Results:  Recent Labs    10/02/22 0453 10/03/22 0342  WBC 15.5* 14.5*  HGB 9.6* 9.1*  HCT 31.3* 29.3*  PLT 431* 392   BMET Recent Labs    10/02/22 0453 10/03/22 0342  NA 137 138  K 4.1 3.8  CL 102 101  CO2 26 25  GLUCOSE 105* 94  BUN 30* 27*  CREATININE 1.35* 1.24  CALCIUM 9.2 8.8*   PT/INR No results for input(s): "LABPROT", "INR" in the last 72 hours.  Studies/Results: DG Abd Portable 1 View  Result Date: 10/01/2022 CLINICAL DATA:  Evaluate NG placement EXAM: PORTABLE ABDOMEN - 1 VIEW COMPARISON:  Earlier today FINDINGS: There is a enteric tube with tip and side port below the GE junction within the gastric fundus. Mildly dilated loops of small bowel  are noted in the left hemiabdomen. IMPRESSION: Enteric tube tip and side port below the GE junction within the gastric fundus. Electronically Signed   By: Kerby Moors M.D.   On: 10/01/2022 13:58   DG Abd Portable 1 View  Result Date: 10/01/2022 CLINICAL DATA:  NG tube placement. EXAM: PORTABLE ABDOMEN - 1 VIEW COMPARISON:  None Available. FINDINGS: NG tube tip is in the mid stomach with proximal side port at or just below the GE junction. Mild gaseous small bowel distension noted in the left abdomen. IMPRESSION: NG tube tip is in the mid stomach with proximal side port at or just below the GE junction. NG tube could be advanced approximately 3 cm to ensure side port is below the GE junction. Electronically Signed   By: Misty Stanley M.D.   On: 10/01/2022 13:10    Anti-infectives: Anti-infectives (From admission, onward)    Start     Dose/Rate Route Frequency Ordered Stop   10/02/22 1000  meropenem (MERREM) 2 g in sodium chloride 0.9 % 100 mL IVPB  Status:  Discontinued        2 g 280 mL/hr over 30 Minutes Intravenous Every 12 hours 10/02/22 0823 10/02/22 0844   10/02/22 1000  meropenem (MERREM) 1 g in sodium chloride 0.9 % 100 mL IVPB        1 g 200 mL/hr over 30 Minutes Intravenous Every 12 hours 10/02/22 0844         Assessment/Plan:  Patient is an 81 year old male who was admitted with concern for small bowel obstruction.  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  Defer antibiotics to hospitalist -Appreciate hospitalist recommendations   LOS: 2 days    [place] 10/03/2022

## 2022-10-03 NOTE — Progress Notes (Addendum)
Progress Note    Joshua Osborne   LFY:[id number]  DOB: 08/16/1941  DOA: 10/01/2022     2 PCP: Earney Mallet, MD  Initial CC: abdominal pain, nausea  Hospital Course: 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  He underwent left nephrectomy August 2023 with Dr. Gilford Rile.  He follows outpatient with oncology and has been treated with enfortumab and pembrolizumab.  He was recently started on Levaquin for a resistant UTI (ESBL Klebsiella) along with prednisone for some joint pains.  He completed antibiotics on 09/30/2022 and has only been on prednisone for approximately 1 week.  He did notice some improvement in his joint pains. He also has a history of prior SBO in January 2023 treated at [facility].  This did not require any surgical intervention and resolved after NG tube placement and bowel rest.  He presented to the ER due to 2 to 3-day complaints of nausea and unable to tolerate food along with epigastric abdominal pain. CT abdomen/pelvis was performed which showed multiple dilated small bowel loops in the left hemiabdomen with concern for transition point in the right lower quadrant. He underwent NG tube placement in the ER and general surgery was consulted.  He is admitted for further workup and evaluation.  Interval History:  No events overnight.  NG tube still draining.  He is endorsing flatus and a bowel movement this morning.  Feels like his abdomen is also softer.  Did have some nausea this morning and nurse was asking for alternative medication in addition to Zofran.   Assessment and Plan: * Small bowel obstruction ([place]) - hx of similar Jan 2023, treated in [place] (no surgery needed, did need NGT), stayed in hospital 4-5 days; unable to review records however - presents with ~3 days poor appetite and epigastric abdominal pain  - CT shows "multiple mildly dilated  loops of small bowel in the LEFT hemiabdomen with interdigitating fat stranding and trace fluid. This is new in comparison to prior. Findings are concerning for small bowel obstruction. Transition point is favored to be in the RIGHT lower quadrant." - NGT inserted in ER; continued to [facility] - general surgery following - continue NPO, pain, and nausea control  - continue IVF - gastrograffin study being performed 12/26; if passes, potential trial of CLD - noted his prolonged NPO status; he had "poor" intake ~3 days prior to admission per family and if does have ongoing NPO status, would need to consider early TPN if going to require surgery, but hopefully SB followthru is reassuring  Transitional cell carcinoma of renal pelvis, left ([place]) - follows with oncology; last seen 09/21/22 - has been on prednisone ~1 week for joint pains, hold steroid in setting of possible surgery - recent PET 08/03/22 showing new mets to mediastinal nodes and left supraclavicular node along with new small round pulmonary nodules -Continue treatment outpatient with oncology  Dysuria - Patient recently completed course of Levaquin on 09/30/2022 for Klebsiella ESBL -This morning he is now complaining of recurrent burning with urination; he does likely have a component of colonization but given his symptoms and leukocytosis (although was on steroids outpatient too) along with underlying malignancy and SBO, prudent for treating for now while in hospital - start meropenem; send urine culture; UA was done on admission  Acute urinary retention - worsening retention and dysuria on 12/25 - elevated PVR; foley inserted and patient had relief  of symptoms - urine culture obtained prior to foley insertion; remove as able  Neck pain - Patient has pain and tenderness over the right trapezius muscle.  No vertebral spinal tenderness.  Also has no neuropathic findings including right upper extremity numbness, tingling, weakness -  Suspect muscle pain/strain from being in bed however patient also endorses was told could be a side effect of his cancer treatment - Patient requesting imaging studies to evaluate - Reasonable to obtain x-ray.  Cervical spine x-ray showed mild degenerative disc disease at C6-7 and facet arthropathy most advanced at C2-3.  No acute findings - Continue supportive care; can try heating pad too  Chronic kidney disease, stage 3b ([place]) - patient has history of CKD3b. Baseline creat ~ 1.6, eGFR 40 - currently at baseline  Mixed hyperlipidemia - hold home meds  Controlled type 2 diabetes mellitus without complication, without long-term current use of insulin ([place]) - NPO - q4h SSI while NPO  Old records reviewed in assessment of this patient  Antimicrobials: Meropenem 12/25 >> current  DVT prophylaxis:  heparin injection 5,000 Units Start: 10/01/22 2200   Code Status:   Code Status: Full Code  Mobility Assessment (last 72 hours)     Mobility Assessment     Row Name 10/02/22 1200 10/01/22 1723 10/01/22 1600       Does patient have an order for bedrest or is patient medically unstable No - Continue assessment No - Continue assessment No - Continue assessment     What is the highest level of mobility based on the progressive mobility assessment? Level 5 (Walks with assist in room/hall) - Balance while stepping forward/back and can walk in room with assist - Complete Level 5 (Walks with assist in room/hall) - Balance while stepping forward/back and can walk in room with assist - Complete Level 5 (Walks with assist in room/hall) - Balance while stepping forward/back and can walk in room with assist - Complete              Barriers to discharge:  Disposition Plan:  Home 3-4 days or possibly longer Status is: Inpt  Objective: Blood pressure 130/74, pulse 98, temperature 98.1 F (36.7 C), temperature source Oral, resp. rate 12, height 6' (1.829 m), weight 73.9 kg, SpO2 95 %.   Examination:  Physical Exam Constitutional:      General: He is not in acute distress.    Appearance: He is well-developed.  HENT:     Head: Normocephalic and atraumatic.     Mouth/Throat:     Mouth: Mucous membranes are moist.  Eyes:     Extraocular Movements: Extraocular movements intact.  Neck:     Comments: No cervical spinal tenderness.  Does have tenderness over right trapezius muscle Cardiovascular:     Rate and Rhythm: Normal rate and regular rhythm.  Pulmonary:     Effort: Pulmonary effort is normal. No respiratory distress.     Breath sounds: Normal breath sounds. No wheezing.  Abdominal:     Comments: Softer, no distention, still hypoactive bowel sounds in lower quadrants.  Less tenderness to palpation  Genitourinary:    Comments: Foley in place draining clear yellow urine Musculoskeletal:        General: Normal range of motion.     Cervical back: Normal range of motion and neck supple. No rigidity.  Skin:    General: Skin is warm and dry.  Neurological:     General: No focal deficit present.     Mental Status:  He is alert.  Psychiatric:        Mood and Affect: Mood normal.        Behavior: Behavior normal.      Consultants:  General surgery  Procedures:    Data Reviewed: Results for orders placed or performed during the hospital encounter of 10/01/22 (from the past 24 hour(s))  Glucose, capillary     Status: Abnormal   Collection Time: 10/02/22  4:10 PM  Result Value Ref Range   Glucose-Capillary 116 (H) 70 - 99 mg/dL  Glucose, capillary     Status: Abnormal   Collection Time: 10/02/22  7:49 PM  Result Value Ref Range   Glucose-Capillary 102 (H) 70 - 99 mg/dL  Glucose, capillary     Status: Abnormal   Collection Time: 10/02/22 11:56 PM  Result Value Ref Range   Glucose-Capillary 110 (H) 70 - 99 mg/dL  Basic metabolic panel     Status: Abnormal   Collection Time: 10/03/22  3:42 AM  Result Value Ref Range   Sodium 138 135 - 145 mmol/L    Potassium 3.8 3.5 - 5.1 mmol/L   Chloride 101 98 - 111 mmol/L   CO2 25 22 - 32 mmol/L   Glucose, Bld 94 70 - 99 mg/dL   BUN 27 (H) 8 - 23 mg/dL   Creatinine, Ser 1.24 0.61 - 1.24 mg/dL   Calcium 8.8 (L) 8.9 - 10.3 mg/dL   GFR, Estimated 59 (L) >60 mL/min   Anion gap 12 5 - 15  CBC with Differential/Platelet     Status: Abnormal   Collection Time: 10/03/22  3:42 AM  Result Value Ref Range   WBC 14.5 (H) 4.0 - 10.5 K/uL   RBC 4.61 4.22 - 5.81 MIL/uL   Hemoglobin 9.1 (L) 13.0 - 17.0 g/dL   HCT 29.3 (L) 39.0 - 52.0 %   MCV 63.6 (L) 80.0 - 100.0 fL   MCH 19.7 (L) 26.0 - 34.0 pg   MCHC 31.1 30.0 - 36.0 g/dL   RDW 17.2 (H) 11.5 - 15.5 %   Platelets 392 150 - 400 K/uL   nRBC 0.0 0.0 - 0.2 %   Neutrophils Relative % 81 %   Neutro Abs 11.9 (H) 1.7 - 7.7 K/uL   Lymphocytes Relative 8 %   Lymphs Abs 1.2 0.7 - 4.0 K/uL   Monocytes Relative 9 %   Monocytes Absolute 1.3 (H) 0.1 - 1.0 K/uL   Eosinophils Relative 0 %   Eosinophils Absolute 0.1 0.0 - 0.5 K/uL   Basophils Relative 1 %   Basophils Absolute 0.1 0.0 - 0.1 K/uL   Immature Granulocytes 1 %   Abs Immature Granulocytes 0.10 (H) 0.00 - 0.07 K/uL   Tear Drop Cells PRESENT    Spherocytes PRESENT   Phosphorus     Status: None   Collection Time: 10/03/22  3:42 AM  Result Value Ref Range   Phosphorus 3.5 2.5 - 4.6 mg/dL  Magnesium     Status: None   Collection Time: 10/03/22  3:42 AM  Result Value Ref Range   Magnesium 2.0 1.7 - 2.4 mg/dL  Glucose, capillary     Status: Abnormal   Collection Time: 10/03/22  4:13 AM  Result Value Ref Range   Glucose-Capillary 108 (H) 70 - 99 mg/dL  Glucose, capillary     Status: Abnormal   Collection Time: 10/03/22  7:44 AM  Result Value Ref Range   Glucose-Capillary 105 (H) 70 - 99 mg/dL   Comment 1  Notify RN    Comment 2 Document in Chart   Glucose, capillary     Status: Abnormal   Collection Time: 10/03/22 12:14 PM  Result Value Ref Range   Glucose-Capillary 105 (H) 70 - 99 mg/dL    Comment 1 Notify RN    Comment 2 Document in Chart     I have Reviewed nursing notes, Vitals, and Lab results since pt's last encounter. Pertinent lab results : see above I have ordered labwork to follow up on.  I have reviewed the last note from staff over past 24 hours I have discussed pt's care plan and test results with nursing staff, CM/SW, and other staff as appropriate  Time spent: Greater than 50% of the 55 minute visit was spent in counseling/coordination of care for the patient as laid out in the A&P.   LOS: 2 days   Dwyane Dee, MD [facility] 10/03/2022, 1:18 PM

## 2022-10-04 ENCOUNTER — Encounter (HOSPITAL_COMMUNITY)
Admission: EM | Disposition: A | Discharge status: Home / Self Care | Payer: Self-pay | Source: Home / Self Care | Attending: Internal Medicine

## 2022-10-04 DIAGNOSIS — K56609 Unspecified intestinal obstruction, unspecified as to partial versus complete obstruction: Secondary | ICD-10-CM | POA: Diagnosis not present

## 2022-10-04 DIAGNOSIS — C652 Malignant neoplasm of left renal pelvis: Secondary | ICD-10-CM | POA: Diagnosis not present

## 2022-10-04 DIAGNOSIS — R300 Dysuria: Secondary | ICD-10-CM | POA: Diagnosis not present

## 2022-10-04 LAB — BASIC METABOLIC PANEL
Anion gap: 11 (ref 5–15)
BUN: 25 mg/dL — ABNORMAL HIGH (ref 8–23)
CO2: 27 mmol/L (ref 22–32)
Calcium: 8.7 mg/dL — ABNORMAL LOW (ref 8.9–10.3)
Chloride: 102 mmol/L (ref 98–111)
Creatinine, Ser: 1.14 mg/dL (ref 0.61–1.24)
GFR, Estimated: >60 mL/min (ref >60)
Glucose, Bld: 81 mg/dL (ref 70–99)
Potassium: 4 mmol/L (ref 3.5–5.1)
Sodium: 140 mmol/L (ref 135–145)

## 2022-10-04 LAB — CBC WITH DIFFERENTIAL/PLATELET
Abs Immature Granulocytes: 0.12 10*3/uL — ABNORMAL HIGH (ref 0.00–0.07)
Basophils Absolute: 0.1 10*3/uL (ref 0.0–0.1)
Basophils Relative: 0 %
Eosinophils Absolute: 0.2 10*3/uL (ref 0.0–0.5)
Eosinophils Relative: 1 %
HCT: 27.8 % — ABNORMAL LOW (ref 39.0–52.0)
Hemoglobin: 8.6 g/dL — ABNORMAL LOW (ref 13.0–17.0)
Immature Granulocytes: 1 %
Lymphocytes Relative: 9 %
Lymphs Abs: 1.2 10*3/uL (ref 0.7–4.0)
MCH: 20 pg — ABNORMAL LOW (ref 26.0–34.0)
MCHC: 30.9 g/dL (ref 30.0–36.0)
MCV: 64.8 fL — ABNORMAL LOW (ref 80.0–100.0)
Monocytes Absolute: 1.5 10*3/uL — ABNORMAL HIGH (ref 0.1–1.0)
Monocytes Relative: 11 %
Neutro Abs: 10.6 10*3/uL — ABNORMAL HIGH (ref 1.7–7.7)
Neutrophils Relative %: 78 %
Platelets: 322 10*3/uL (ref 150–400)
RBC: 4.29 MIL/uL (ref 4.22–5.81)
RDW: 17.2 % — ABNORMAL HIGH (ref 11.5–15.5)
WBC: 13.7 10*3/uL — ABNORMAL HIGH (ref 4.0–10.5)
nRBC: 0 % (ref 0.0–0.2)

## 2022-10-04 LAB — GLUCOSE, CAPILLARY
Glucose-Capillary: 106 mg/dL — ABNORMAL HIGH (ref 70–99)
Glucose-Capillary: 107 mg/dL — ABNORMAL HIGH (ref 70–99)
Glucose-Capillary: 119 mg/dL — ABNORMAL HIGH (ref 70–99)
Glucose-Capillary: 172 mg/dL — ABNORMAL HIGH (ref 70–99)
Glucose-Capillary: 99 mg/dL (ref 70–99)
Glucose-Capillary: 99 mg/dL (ref 70–99)

## 2022-10-04 LAB — PHOSPHORUS: Phosphorus: 2.8 mg/dL (ref 2.5–4.6)

## 2022-10-04 LAB — MAGNESIUM: Magnesium: 1.9 mg/dL (ref 1.7–2.4)

## 2022-10-04 SURGERY — LAPAROTOMY, EXPLORATORY
Anesthesia: General

## 2022-10-04 MED ORDER — OXYCODONE HCL ER 10 MG PO T12A
10.0000 mg | EXTENDED_RELEASE_TABLET | Freq: Two times a day (BID) | ORAL | Status: DC
  Administered 2022-10-04 – 2022-10-05 (×3): 10 mg via ORAL
  Filled 2022-10-04 (×3): qty 1

## 2022-10-04 MED ORDER — FENTANYL CITRATE PF 50 MCG/ML IJ SOSY
25.0000 ug | PREFILLED_SYRINGE | INTRAMUSCULAR | Status: DC | PRN
  Administered 2022-10-04 – 2022-10-05 (×5): 25 ug via INTRAVENOUS
  Filled 2022-10-04 (×5): qty 1

## 2022-10-04 MED ORDER — POLYETHYLENE GLYCOL 3350 17 G PO PACK
17.0000 g | PACK | Freq: Every day | ORAL | Status: DC
  Administered 2022-10-04 – 2022-10-05 (×2): 17 g via ORAL
  Filled 2022-10-04 (×2): qty 1

## 2022-10-04 MED ORDER — DOCUSATE SODIUM 100 MG PO CAPS
100.0000 mg | ORAL_CAPSULE | Freq: Two times a day (BID) | ORAL | Status: DC
  Administered 2022-10-04 – 2022-10-05 (×3): 100 mg via ORAL
  Filled 2022-10-04 (×3): qty 1

## 2022-10-04 NOTE — Progress Notes (Signed)
[facility] Progress Note  Day of Surgery  Subjective: Patient seen and examined.  He is resting comfortably in bed.  NG tube was removed this morning, and he is tolerating his clear liquid diet without nausea and vomiting.  He had 6 bowel movements overnight, and confirms passing flatus.  He denies any abdominal pain at this time.  Objective: Vital signs in last 24 hours: Temp:  [98 F (36.7 C)-98.8 F (37.1 C)] 98 F (36.7 C) (12/27 0437) Pulse Rate:  [84-102] 94 (12/27 0437) Resp:  [17-20] 18 (12/27 0437) BP: (131-145)/(64-75) 143/64 (12/27 0437) SpO2:  [94 %-96 %] 95 % (12/27 0437) Last BM Date : 10/03/22  Intake/Output from previous day: 12/26 0701 - 12/27 0700 In: 989.9 [I.V.:789.9; IV Piggyback:200] Out: 6237 [Urine:600; Emesis/NG output:650; Stool:1] Intake/Output this shift: No intake/output data recorded.  General appearance: alert, cooperative, and no distress GI: Abdomen soft, nondistended, no percussion tenderness, nontender to palpation; no rigidity, guarding, rebound tenderness  Lab Results:  Recent Labs    10/03/22 0342 10/04/22 0324  WBC 14.5* 13.7*  HGB 9.1* 8.6*  HCT 29.3* 27.8*  PLT 392 322   BMET Recent Labs    10/03/22 0342 10/04/22 0324  NA 138 140  K 3.8 4.0  CL 101 102  CO2 25 27  GLUCOSE 94 81  BUN 27* 25*  CREATININE 1.24 1.14  CALCIUM 8.8* 8.7*   PT/INR No results for input(s): "LABPROT", "INR" in the last 72 hours.  Studies/Results: DG Abd Portable 1V-Small Bowel Obstruction Protocol-initial, 8 hr delay  Result Date: 10/03/2022 CLINICAL DATA:  8 hour small-bowel follow-up film EXAM: PORTABLE ABDOMEN - 1 VIEW COMPARISON:  10/03/2022 FINDINGS: Administered contrast now lies throughout the small bowel as well as within the colon. Mild small bowel prominence remains. These findings are consistent with partial small bowel obstruction. The degree of small-bowel dilatation has improved slightly in the interval from  prior CT. No free air is noted. No acute bony abnormality is seen. IMPRESSION: Administered contrast lies within the small bowel and colon consistent with partial small bowel obstruction. The degree of small-bowel dilatation has improved in the interval from 10/01/2022 Electronically Signed   By: Inez Catalina M.D.   On: 10/03/2022 23:19   DG Abd Portable 1V-Small Bowel Protocol-Position Verification  Result Date: 10/03/2022 CLINICAL DATA:  Nasogastric placement EXAM: PORTABLE ABDOMEN - 1 VIEW COMPARISON:  10/01/2022 FINDINGS: Nasogastric tube tip is in the distal esophagus just above the diaphragm . IMPRESSION: Nasogastric tube tip in the distal esophagus just above the diaphragm. Electronically Signed   By: Nelson Chimes M.D.   On: 10/03/2022 13:41   DG Cervical Spine 2 or 3 views  Result Date: 10/03/2022 CLINICAL DATA:  Neck pain EXAM: CERVICAL SPINE - 2-3 VIEW COMPARISON:  None Available. FINDINGS: There is no evidence of cervical spine fracture or prevertebral soft tissue swelling. Alignment is normal. Mild degenerative disc disease at C6-7. Multilevel facet arthropathy, most advanced at the C2-3 level. Partially imaged nasogastric tube and right IJ central line. Carotid atherosclerosis. 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 No acute findings. Electronically Signed   By: Davina Poke D.O.   On: 10/03/2022 10:58    Anti-infectives: Anti-infectives (From admission, onward)    Start     Dose/Rate Route Frequency Ordered Stop   10/02/22 1000  meropenem (MERREM) 2 g in sodium chloride 0.9 % 100 mL IVPB  Status:  Discontinued        2 g 280 mL/hr over 30 Minutes  Intravenous Every 12 hours 10/02/22 0823 10/02/22 0844   10/02/22 1000  meropenem (MERREM) 1 g in sodium chloride 0.9 % 100 mL IVPB        1 g 200 mL/hr over 30 Minutes Intravenous Every 12 hours 10/02/22 0844         Assessment/Plan:  Patient is an 81 year old male who was admitted with small bowel  obstruction.  -8-hour small bowel obstruction protocol film demonstrated mildly dilated loops of small bowel with contrast within the small bowel and colon -Given imaging and return of bowel function, NG tube was removed and clear diet was initiated -Will advance to full liquid diet -May advance diet as tolerated -Bowel regimen ordered -PRN pain control and antiemetics -Leukocytosis continues to slightly improved, 13.7 from 14.5 -Appreciate hospitalist recommendations   LOS: 3 days    Joshua Osborne A Joshua Osborne 10/04/2022

## 2022-10-04 NOTE — Progress Notes (Signed)
Removed patients NG tube earlier, patient tolerated well > He is tolerating clear liquids with no nausea or vomiting thus far in shift.

## 2022-10-04 NOTE — Consult Note (Signed)
[facility] Consultation Oncology  Name: Joshua Osborne      MRN: [id number]    Location: A304/A304-01  Date: 10/04/2022 Time:6:11 PM   REFERRING PHYSICIAN: Dr. Denton Brick  REASON FOR CONSULT: Metastatic high-grade urothelial carcinoma   DIAGNOSIS: Small bowel obstruction  HISTORY OF PRESENT ILLNESS: Mr. Gilchrest is well-known to me from office visits.  He is on combination enfortumab and pembrolizumab and has completed 2 cycles approximately 2 weeks ago.  He presented to the ER with 2 to 3 days complaint of nausea and unable to tolerate food and epigastric abdominal pain.  CT abdomen and pelvis showed multiple dilated small bowel loops in the left hemidiaphragm concerning for transition point in the right lower quadrant.  He underwent NG tube placement and nonsurgical management.  Today NG tube was removed.  He had multiple bowel movements since yesterday.  He is on full liquid diet and has tolerated well so far.  Denies any abdominal pain at this time.  Feels slightly weak as he was not eating well.  PAST MEDICAL HISTORY:   Past Medical History:  Diagnosis Date   Anemia    Anxiety    Arthritis    Bladder tumor    Cancer ([place])    hx of skin cancer, renal cancer   Diabetes mellitus without complication (HCC)    GERD (gastroesophageal reflux disease)    Heart murmur    as a child   History of kidney stones    Port-A-Cath in place 08/17/2022   Sleep apnea    cpap    ALLERGIES: No Known Allergies    MEDICATIONS: I have reviewed the patient's current medications.     PAST SURGICAL HISTORY Past Surgical History:  Procedure Laterality Date   bilateral dupreyen contracture surgery      CHOLECYSTECTOMY     CYSTOSCOPY N/A 05/30/2022   Procedure: CYSTOSCOPY WITH TRANSURETHRAL RESECTION OF THE LEFT URETERAL ORIFICE,  TRANSURETHRAL RESECTION OF BLADDER TUMOR;  Surgeon: Ceasar Mons, MD;  Location: [facility];  Service: Urology;  Laterality: N/A;   CYSTOSCOPY N/A  09/06/2022   Procedure: CYSTOSCOPY;  Surgeon: Cleon Gustin, MD;  Location: [facility];  Service: Urology;  Laterality: N/A;   CYSTOSCOPY WITH RETROGRADE PYELOGRAM, URETEROSCOPY AND STENT PLACEMENT Left 04/26/2022   Procedure: CYSTOSCOPY WITH RETROGRADE PYELOGRAM, URETEROSCOPY AND STENT PLACEMENT;  Surgeon: Cleon Gustin, MD;  Location: [facility];  Service: Urology;  Laterality: Left;   IR IMAGING GUIDED PORT INSERTION  08/16/2022   LIVER BIOPSY     ROBOT ASSITED LAPAROSCOPIC NEPHROURETERECTOMY Left 05/30/2022   Procedure: XI ROBOT ASSITED LAPAROSCOPIC NEPHROURETERECTOMY;  Surgeon: Ceasar Mons, MD;  Location: [facility];  Service: Urology;  Laterality: Left;   TRANSURETHRAL RESECTION OF BLADDER TUMOR  04/26/2022   Procedure: TRANSURETHRAL RESECTION OF BLADDER TUMOR (TURBT);  Surgeon: Cleon Gustin, MD;  Location: [facility];  Service: Urology;;   TRANSURETHRAL RESECTION OF BLADDER TUMOR N/A 09/06/2022   Procedure: TRANSURETHRAL RESECTION OF BLADDER TUMOR (TURBT);  Surgeon: Cleon Gustin, MD;  Location: [facility];  Service: Urology;  Laterality: N/A;    FAMILY HISTORY: No family history on file.  SOCIAL HISTORY:  reports that he has quit smoking. His smoking use included cigarettes. He has never used smokeless tobacco. He reports that he does not drink alcohol and does not use drugs.  PERFORMANCE STATUS: The patient's performance status is 2 - Symptomatic, <50% confined to bed  PHYSICAL EXAM: Most Recent Vital Signs: Blood pressure (!) 149/68, pulse  87, temperature 98.8 F (37.1 C), temperature source Oral, resp. rate 20, height 6' (1.829 m), weight 163 lb (73.9 kg), SpO2 97 %. BP (!) 149/68 (BP Location: Left Arm)   Pulse 87   Temp 98.8 F (37.1 C) (Oral)   Resp 20   Ht 6' (1.829 m)   Wt 163 lb (73.9 kg)   SpO2 97%   BMI 22.11 kg/m  General appearance: alert, cooperative, and appears stated age Lungs: clear to auscultation bilaterally Heart: regular rate and  rhythm Abdomen:  Soft with soreness in the lower quadrants Extremities:  No edema.  Skin rash on forearms improved. Neurologic: Grossly normal  LABORATORY DATA:  Results for orders placed or performed during the hospital encounter of 10/01/22 (from the past 48 hour(s))  Glucose, capillary     Status: Abnormal   Collection Time: 10/02/22  7:49 PM  Result Value Ref Range   Glucose-Capillary 102 (H) 70 - 99 mg/dL    Comment: Glucose reference range applies only to samples taken after fasting for at least 8 hours.  Glucose, capillary     Status: Abnormal   Collection Time: 10/02/22 11:56 PM  Result Value Ref Range   Glucose-Capillary 110 (H) 70 - 99 mg/dL    Comment: Glucose reference range applies only to samples taken after fasting for at least 8 hours.  Basic metabolic panel     Status: Abnormal   Collection Time: 10/03/22  3:42 AM  Result Value Ref Range   Sodium 138 135 - 145 mmol/L   Potassium 3.8 3.5 - 5.1 mmol/L   Chloride 101 98 - 111 mmol/L   CO2 25 22 - 32 mmol/L   Glucose, Bld 94 70 - 99 mg/dL    Comment: Glucose reference range applies only to samples taken after fasting for at least 8 hours.   BUN 27 (H) 8 - 23 mg/dL   Creatinine, Ser 1.24 0.61 - 1.24 mg/dL   Calcium 8.8 (L) 8.9 - 10.3 mg/dL   GFR, Estimated 59 (L) >60 mL/min    Comment: (NOTE) Calculated using the CKD-EPI Creatinine Equation (2021)    Anion gap 12 5 - 15    Comment: Performed at [facility], [address], [place], [place] [address]  CBC with Differential/Platelet     Status: Abnormal   Collection Time: 10/03/22  3:42 AM  Result Value Ref Range   WBC 14.5 (H) 4.0 - 10.5 K/uL   RBC 4.61 4.22 - 5.81 MIL/uL   Hemoglobin 9.1 (L) 13.0 - 17.0 g/dL   HCT 29.3 (L) 39.0 - 52.0 %   MCV 63.6 (L) 80.0 - 100.0 fL   MCH 19.7 (L) 26.0 - 34.0 pg   MCHC 31.1 30.0 - 36.0 g/dL   RDW 17.2 (H) 11.5 - 15.5 %   Platelets 392 150 - 400 K/uL   nRBC 0.0 0.0 - 0.2 %   Neutrophils Relative % 81 %   Neutro Abs 11.9  (H) 1.7 - 7.7 K/uL   Lymphocytes Relative 8 %   Lymphs Abs 1.2 0.7 - 4.0 K/uL   Monocytes Relative 9 %   Monocytes Absolute 1.3 (H) 0.1 - 1.0 K/uL   Eosinophils Relative 0 %   Eosinophils Absolute 0.1 0.0 - 0.5 K/uL   Basophils Relative 1 %   Basophils Absolute 0.1 0.0 - 0.1 K/uL   Immature Granulocytes 1 %   Abs Immature Granulocytes 0.10 (H) 0.00 - 0.07 K/uL   Tear Drop Cells PRESENT    Spherocytes  PRESENT     Comment: Performed at [facility], [address], [place], [place] [address]  Phosphorus     Status: None   Collection Time: 10/03/22  3:42 AM  Result Value Ref Range   Phosphorus 3.5 2.5 - 4.6 mg/dL    Comment: Performed at [facility], [address], [place], [place] [address]  Magnesium     Status: None   Collection Time: 10/03/22  3:42 AM  Result Value Ref Range   Magnesium 2.0 1.7 - 2.4 mg/dL    Comment: Performed at [facility], [address] [place], [place] [address]  Glucose, capillary     Status: Abnormal   Collection Time: 10/03/22  4:13 AM  Result Value Ref Range   Glucose-Capillary 108 (H) 70 - 99 mg/dL    Comment: Glucose reference range applies only to samples taken after fasting for at least 8 hours.  Glucose, capillary     Status: Abnormal   Collection Time: 10/03/22  7:44 AM  Result Value Ref Range   Glucose-Capillary 105 (H) 70 - 99 mg/dL    Comment: Glucose reference range applies only to samples taken after fasting for at least 8 hours.   Comment 1 Notify RN    Comment 2 Document in Chart   Glucose, capillary     Status: Abnormal   Collection Time: 10/03/22 12:14 PM  Result Value Ref Range   Glucose-Capillary 105 (H) 70 - 99 mg/dL    Comment: Glucose reference range applies only to samples taken after fasting for at least 8 hours.   Comment 1 Notify RN    Comment 2 Document in Chart   Glucose, capillary     Status: None   Collection Time: 10/03/22  4:44 PM  Result Value Ref Range   Glucose-Capillary 98 70 - 99 mg/dL    Comment: Glucose  reference range applies only to samples taken after fasting for at least 8 hours.  Glucose, capillary     Status: None   Collection Time: 10/03/22  8:14 PM  Result Value Ref Range   Glucose-Capillary 91 70 - 99 mg/dL    Comment: Glucose reference range applies only to samples taken after fasting for at least 8 hours.   Comment 1 Notify RN    Comment 2 Document in Chart   Glucose, capillary     Status: None   Collection Time: 10/03/22 11:32 PM  Result Value Ref Range   Glucose-Capillary 96 70 - 99 mg/dL    Comment: Glucose reference range applies only to samples taken after fasting for at least 8 hours.  Basic metabolic panel     Status: Abnormal   Collection Time: 10/04/22  3:24 AM  Result Value Ref Range   Sodium 140 135 - 145 mmol/L   Potassium 4.0 3.5 - 5.1 mmol/L   Chloride 102 98 - 111 mmol/L   CO2 27 22 - 32 mmol/L   Glucose, Bld 81 70 - 99 mg/dL    Comment: Glucose reference range applies only to samples taken after fasting for at least 8 hours.   BUN 25 (H) 8 - 23 mg/dL   Creatinine, Ser 1.14 0.61 - 1.24 mg/dL   Calcium 8.7 (L) 8.9 - 10.3 mg/dL   GFR, Estimated >60 >60 mL/min    Comment: (NOTE) Calculated using the CKD-EPI Creatinine Equation (2021)    Anion gap 11 5 - 15    Comment: Performed at [facility], [address], [place], [place] [address]  CBC with Differential/Platelet     Status: Abnormal   Collection Time: 10/04/22  3:24 AM  Result Value Ref Range   WBC 13.7 (H) 4.0 - 10.5 K/uL   RBC 4.29 4.22 - 5.81 MIL/uL   Hemoglobin 8.6 (L) 13.0 - 17.0 g/dL    Comment: Reticulocyte Hemoglobin testing may be clinically indicated, consider ordering this additional test [id number]    HCT 27.8 (L) 39.0 - 52.0 %   MCV 64.8 (L) 80.0 - 100.0 fL   MCH 20.0 (L) 26.0 - 34.0 pg   MCHC 30.9 30.0 - 36.0 g/dL   RDW 17.2 (H) 11.5 - 15.5 %   Platelets 322 150 - 400 K/uL   nRBC 0.0 0.0 - 0.2 %   Neutrophils Relative % 78 %   Neutro Abs 10.6 (H) 1.7 - 7.7 K/uL   Lymphocytes  Relative 9 %   Lymphs Abs 1.2 0.7 - 4.0 K/uL   Monocytes Relative 11 %   Monocytes Absolute 1.5 (H) 0.1 - 1.0 K/uL   Eosinophils Relative 1 %   Eosinophils Absolute 0.2 0.0 - 0.5 K/uL   Basophils Relative 0 %   Basophils Absolute 0.1 0.0 - 0.1 K/uL   Immature Granulocytes 1 %   Abs Immature Granulocytes 0.12 (H) 0.00 - 0.07 K/uL    Comment: Performed at [facility], [address], [place], [place] [address]  Phosphorus     Status: None   Collection Time: 10/04/22  3:24 AM  Result Value Ref Range   Phosphorus 2.8 2.5 - 4.6 mg/dL    Comment: Performed at [facility], [address], [place], [place] [address]  Magnesium     Status: None   Collection Time: 10/04/22  3:24 AM  Result Value Ref Range   Magnesium 1.9 1.7 - 2.4 mg/dL    Comment: Performed at [facility], [address], [place], [place] [address]  Glucose, capillary     Status: None   Collection Time: 10/04/22  4:50 AM  Result Value Ref Range   Glucose-Capillary 99 70 - 99 mg/dL    Comment: Glucose reference range applies only to samples taken after fasting for at least 8 hours.  Glucose, capillary     Status: None   Collection Time: 10/04/22  8:04 AM  Result Value Ref Range   Glucose-Capillary 99 70 - 99 mg/dL    Comment: Glucose reference range applies only to samples taken after fasting for at least 8 hours.   Comment 1 Notify RN    Comment 2 Document in Chart   Glucose, capillary     Status: Abnormal   Collection Time: 10/04/22 11:09 AM  Result Value Ref Range   Glucose-Capillary 172 (H) 70 - 99 mg/dL    Comment: Glucose reference range applies only to samples taken after fasting for at least 8 hours.   Comment 1 Notify RN    Comment 2 Document in Chart   Glucose, capillary     Status: Abnormal   Collection Time: 10/04/22  4:19 PM  Result Value Ref Range   Glucose-Capillary 107 (H) 70 - 99 mg/dL    Comment: Glucose reference range applies only to samples taken after fasting for at least 8 hours.   Comment 1  Notify RN    Comment 2 Document in Chart       RADIOGRAPHY: DG Abd Portable 1V-Small Bowel Obstruction Protocol-initial, 8 hr delay  Result Date: 10/03/2022 CLINICAL DATA:  8 hour small-bowel follow-up film EXAM: PORTABLE ABDOMEN -  1 VIEW COMPARISON:  10/03/2022 FINDINGS: Administered contrast now lies throughout the small bowel as well as within the colon. Mild small bowel prominence remains. These findings are consistent with partial small bowel obstruction. The degree of small-bowel dilatation has improved slightly in the interval from prior CT. No free air is noted. No acute bony abnormality is seen. IMPRESSION: Administered contrast lies within the small bowel and colon consistent with partial small bowel obstruction. The degree of small-bowel dilatation has improved in the interval from 10/01/2022 Electronically Signed   By: Inez Catalina M.D.   On: 10/03/2022 23:19   DG Abd Portable 1V-Small Bowel Protocol-Position Verification  Result Date: 10/03/2022 CLINICAL DATA:  Nasogastric placement EXAM: PORTABLE ABDOMEN - 1 VIEW COMPARISON:  10/01/2022 FINDINGS: Nasogastric tube tip is in the distal esophagus just above the diaphragm . IMPRESSION: Nasogastric tube tip in the distal esophagus just above the diaphragm. Electronically Signed   By: Nelson Chimes M.D.   On: 10/03/2022 13:41   DG Cervical Spine 2 or 3 views  Result Date: 10/03/2022 CLINICAL DATA:  Neck pain EXAM: CERVICAL SPINE - 2-3 VIEW COMPARISON:  None Available. FINDINGS: There is no evidence of cervical spine fracture or prevertebral soft tissue swelling. Alignment is normal. Mild degenerative disc disease at C6-7. Multilevel facet arthropathy, most advanced at the C2-3 level. Partially imaged nasogastric tube and right IJ central line. Carotid atherosclerosis. 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 No acute findings. Electronically Signed   By: Davina Poke D.O.   On: 10/03/2022 10:58         ASSESSMENT  and PLAN:  1.  Small bowel obstruction: - History of small bowel obstruction in January 2023, managed conservatively in [place]. - I have reviewed admission CT scans. - NG tube removed this morning.  Had multiple bowel movements since yesterday.  Improving with conservative measures.  Diet is being advanced.  2.  Urinary retention: - Previous culture showed Klebsiella ESBL.  Treated with meropenem until 10/04/2022.  Currently has Foley catheter.  Voiding trial will be done prior to discharge.  3.  Stage IV urothelial carcinoma: - Completed 2 cycles of enfortumab and pembrolizumab. - Will resume therapy after discharge. - Discussed with patient.  All questions were answered. The patient knows to call the clinic with any problems, questions or concerns. We can certainly see the patient much sooner if necessary.    Derek Jack

## 2022-10-04 NOTE — Progress Notes (Signed)
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  He underwent left nephrectomy August 2023 with Dr. Gilford Osborne.  He follows outpatient with oncology and has been treated with enfortumab and pembrolizumab.  He was recently started on Levaquin for a resistant UTI (ESBL Klebsiella) along with prednisone for some joint pains.  He completed antibiotics on 09/30/2022 and has only been on prednisone for approximately 1 week.  He did notice some improvement in his joint pains. He also has a history of prior SBO in January 2023 treated at [facility].  This did not require any surgical intervention and resolved after NG tube placement and bowel rest.  He presented to the ER due to 2 to 3-day complaints of nausea and unable to tolerate food along with epigastric abdominal pain. CT abdomen/pelvis was performed which showed multiple dilated small bowel loops in the left hemiabdomen with concern for transition point in the right lower quadrant. He underwent NG tube placement in the ER and general surgery was consulted.  He is admitted for further workup and evaluation.  Interval History:  -wife  at bedside, had at least 6 BMs since drinking Gastrografin for small bowel protocol study on 10/03/2022 -Complains of generalized pain, he is chronically on opiates at home   Assessment and Plan: 1) Small bowel obstruction (Joshua Osborne) - hx of similar Jan 2023, treated in [facility] (no surgery needed, did need NGT), stayed in hospital 4-5 days; unable to review records however - presents with ~3 days poor appetite and epigastric abdominal pain  - CT shows "multiple mildly dilated loops of small bowel in the LEFT hemiabdomen with interdigitating fat  stranding and trace fluid. This is new in comparison to prior. Findings are concerning for small bowel obstruction. Transition point is favored to be in the RIGHT lower quadrant." - NGT inserted in ER; continued to [facility] -Discussed with Dr. Andris Osborne fromgeneral surgery  --had at least 6 BMs since drinking Gastrografin for small bowel protocol study on 10/03/2022 -NG tube removed on 1220 04/28/2019 -Per general surgeon okay to start liquid diet -  Transitional cell carcinoma of renal pelvis, left (Joshua Osborne) - follows with oncology; last seen 09/21/22 - has been on prednisone ~1 week for joint pains, hold steroid in setting of possible surgery - recent PET 08/03/22 showing new mets to mediastinal nodes and left supraclavicular node along with new small round pulmonary nodules -Continue treatment outpatient with oncology -Continue Foley catheter  Dysuria - Patient recently completed course of Levaquin on 09/30/2022 for Klebsiella ESBL -This morning he is now complaining of recurrent burning with urination; he does likely have a component of colonization but given his symptoms and leukocytosis (although was on steroids outpatient too) along with underlying malignancy and SBO, prudent for treating for now while in hospital --Urine culture from 10/02/2022 NGTD -Stop meropenem on 10/04/2022  Acute urinary retention - worsening retention and dysuria on 10/02/22 - elevated PVR; foley inserted and patient had relief of symptoms - urine culture obtained prior to foley insertion;  -Voiding trial prior to discharge home  Neck pain - Patient has pain and tenderness over the right trapezius muscle.  No vertebral spinal tenderness.  Also has no neuropathic findings including right upper  extremity numbness, tingling, weakness - Suspect muscle pain/strain from being in bed however patient also endorses was told could be a side effect of his cancer treatment - Patient requesting imaging studies to evaluate -  Reasonable to obtain x-ray.  Cervical spine x-ray showed mild degenerative disc disease at C6-7 and facet arthropathy most advanced at C2-3.  No acute findings - Continue supportive care; can try heating pad too -Restart chronic opiate at a lower dose than home dose due to GI concerns  Chronic kidney disease, stage 3b (Joshua Osborne) - patient has history of CKD3b. Baseline creat ~ 1.6, eGFR 40 - currently at baseline  Mixed hyperlipidemia - hold home meds  Controlled type 2 diabetes mellitus without complication, without long-term current use of insulin (Joshua Osborne) - NPO Use Novolog/Humalog Sliding scale insulin with Accu-Cheks/Fingersticks as ordered    Antimicrobials: Meropenem 12/25 >> 10/04/22  DVT prophylaxis:  heparin injection 5,000 Units Start: 10/01/22 2200   Code Status:   Code Status: Full Code  Mobility Assessment (last 72 hours)     Mobility Assessment     Row Name 10/04/22 0900 10/03/22 1430 10/02/22 1200 10/01/22 1723 10/01/22 1600   Does patient have an order for bedrest or is patient medically unstable No - Continue assessment No - Continue assessment No - Continue assessment No - Continue assessment No - Continue assessment   What is the highest level of mobility based on the progressive mobility assessment? Level 6 (Walks independently in room and hall) - Balance while walking in room without assist - Complete Level 5 (Walks with assist in room/hall) - Balance while stepping forward/back and can walk in room with assist - Complete Level 5 (Walks with assist in room/hall) - Balance while stepping forward/back and can walk in room with assist - Complete Level 5 (Walks with assist in room/hall) - Balance while stepping forward/back and can walk in room with assist - Complete Level 5 (Walks with assist in room/hall) - Balance while stepping forward/back and can walk in room with assist - Complete            Barriers to discharge:  Disposition Plan:  Home 1 to 2 days if  tolerating oral intake   status is: Inpt  Objective: Blood pressure (!) 143/64, pulse 94, temperature 98 F (36.7 C), temperature source Oral, resp. rate 18, height 6' (1.829 m), weight 73.9 kg, SpO2 95 %.   Physical Exam  Gen:- Awake Alert, in no acute distress  HEENT:- [place].AT, No sclera icterus Nose-NG tube removed Neck-Supple Neck,No JVD,.  Lungs-  CTAB , fair air movement bilaterally  CV- S1, S2 normal, RRR, Rt sided  Port-A-Cath site to Abd-  +ve B.Sounds, Abd Soft, improved minimal discomfort on palpation , left lower quadrant postoperative hernia extremity/Skin:- No  edema,   good pedal pulses  Psych-affect is appropriate, oriented x3 Neuro-no new focal deficits, no tremors  GU-Foley in situ  Consultants:  General surgery  Procedures:    Data Reviewed: Results for orders placed or performed during the hospital encounter of 10/01/22 (from the past 24 hour(s))  Glucose, capillary     Status: Abnormal   Collection Time: 10/03/22 12:14 PM  Result Value Ref Range   Glucose-Capillary 105 (H) 70 - 99 mg/dL   Comment 1 Notify RN    Comment 2 Document in Chart   Glucose, capillary     Status: None   Collection Time: 10/03/22  4:44 PM  Result Value Ref Range   Glucose-Capillary 98 70 -  99 mg/dL  Glucose, capillary     Status: None   Collection Time: 10/03/22  8:14 PM  Result Value Ref Range   Glucose-Capillary 91 70 - 99 mg/dL   Comment 1 Notify RN    Comment 2 Document in Chart   Glucose, capillary     Status: None   Collection Time: 10/03/22 11:32 PM  Result Value Ref Range   Glucose-Capillary 96 70 - 99 mg/dL  Basic metabolic panel     Status: Abnormal   Collection Time: 10/04/22  3:24 AM  Result Value Ref Range   Sodium 140 135 - 145 mmol/L   Potassium 4.0 3.5 - 5.1 mmol/L   Chloride 102 98 - 111 mmol/L   CO2 27 22 - 32 mmol/L   Glucose, Bld 81 70 - 99 mg/dL   BUN 25 (H) 8 - 23 mg/dL   Creatinine, Ser 1.14 0.61 - 1.24 mg/dL   Calcium 8.7 (L) 8.9 - 10.3  mg/dL   GFR, Estimated >60 >60 mL/min   Anion gap 11 5 - 15  CBC with Differential/Platelet     Status: Abnormal   Collection Time: 10/04/22  3:24 AM  Result Value Ref Range   WBC 13.7 (H) 4.0 - 10.5 K/uL   RBC 4.29 4.22 - 5.81 MIL/uL   Hemoglobin 8.6 (L) 13.0 - 17.0 g/dL   HCT 27.8 (L) 39.0 - 52.0 %   MCV 64.8 (L) 80.0 - 100.0 fL   MCH 20.0 (L) 26.0 - 34.0 pg   MCHC 30.9 30.0 - 36.0 g/dL   RDW 17.2 (H) 11.5 - 15.5 %   Platelets 322 150 - 400 K/uL   nRBC 0.0 0.0 - 0.2 %   Neutrophils Relative % 78 %   Neutro Abs 10.6 (H) 1.7 - 7.7 K/uL   Lymphocytes Relative 9 %   Lymphs Abs 1.2 0.7 - 4.0 K/uL   Monocytes Relative 11 %   Monocytes Absolute 1.5 (H) 0.1 - 1.0 K/uL   Eosinophils Relative 1 %   Eosinophils Absolute 0.2 0.0 - 0.5 K/uL   Basophils Relative 0 %   Basophils Absolute 0.1 0.0 - 0.1 K/uL   Immature Granulocytes 1 %   Abs Immature Granulocytes 0.12 (H) 0.00 - 0.07 K/uL  Phosphorus     Status: None   Collection Time: 10/04/22  3:24 AM  Result Value Ref Range   Phosphorus 2.8 2.5 - 4.6 mg/dL  Magnesium     Status: None   Collection Time: 10/04/22  3:24 AM  Result Value Ref Range   Magnesium 1.9 1.7 - 2.4 mg/dL  Glucose, capillary     Status: None   Collection Time: 10/04/22  4:50 AM  Result Value Ref Range   Glucose-Capillary 99 70 - 99 mg/dL  Glucose, capillary     Status: None   Collection Time: 10/04/22  8:04 AM  Result Value Ref Range   Glucose-Capillary 99 70 - 99 mg/dL   Comment 1 Notify RN    Comment 2 Document in Chart   Glucose, capillary     Status: Abnormal   Collection Time: 10/04/22 11:09 AM  Result Value Ref Range   Glucose-Capillary 172 (H) 70 - 99 mg/dL   Comment 1 Notify RN    Comment 2 Document in Chart     LOS: 3 days   Roxan Hockey, MD [facility] 10/04/2022, 11:55 AM

## 2022-10-05 ENCOUNTER — Inpatient Hospital Stay: Payer: MEDICARE

## 2022-10-05 ENCOUNTER — Inpatient Hospital Stay: Payer: MEDICARE | Admitting: Hematology

## 2022-10-05 DIAGNOSIS — K56609 Unspecified intestinal obstruction, unspecified as to partial versus complete obstruction: Secondary | ICD-10-CM | POA: Diagnosis not present

## 2022-10-05 LAB — GLUCOSE, CAPILLARY
Glucose-Capillary: 116 mg/dL — ABNORMAL HIGH (ref 70–99)
Glucose-Capillary: 115 mg/dL — ABNORMAL HIGH (ref 70–99)
Glucose-Capillary: 127 mg/dL — ABNORMAL HIGH (ref 70–99)
Glucose-Capillary: 113 mg/dL — ABNORMAL HIGH (ref 70–99)

## 2022-10-05 LAB — CBC WITH DIFFERENTIAL/PLATELET
Abs Immature Granulocytes: 0.12 10*3/uL — ABNORMAL HIGH (ref 0.00–0.07)
Basophils Absolute: 0.1 10*3/uL (ref 0.0–0.1)
Basophils Relative: 1 %
Eosinophils Absolute: 0.3 10*3/uL (ref 0.0–0.5)
Eosinophils Relative: 2 %
HCT: 26.5 % — ABNORMAL LOW (ref 39.0–52.0)
Hemoglobin: 8.3 g/dL — ABNORMAL LOW (ref 13.0–17.0)
Immature Granulocytes: 1 %
Lymphocytes Relative: 10 %
Lymphs Abs: 1.2 10*3/uL (ref 0.7–4.0)
MCH: 19.9 pg — ABNORMAL LOW (ref 26.0–34.0)
MCHC: 31.3 g/dL (ref 30.0–36.0)
MCV: 63.5 fL — ABNORMAL LOW (ref 80.0–100.0)
Monocytes Absolute: 1.3 10*3/uL — ABNORMAL HIGH (ref 0.1–1.0)
Monocytes Relative: 11 %
Neutro Abs: 8.9 10*3/uL — ABNORMAL HIGH (ref 1.7–7.7)
Neutrophils Relative %: 75 %
Platelets: 294 10*3/uL (ref 150–400)
RBC: 4.17 MIL/uL — ABNORMAL LOW (ref 4.22–5.81)
RDW: 16.8 % — ABNORMAL HIGH (ref 11.5–15.5)
WBC: 11.8 10*3/uL — ABNORMAL HIGH (ref 4.0–10.5)
nRBC: 0 % (ref 0.0–0.2)

## 2022-10-05 LAB — BASIC METABOLIC PANEL
Anion gap: 9 (ref 5–15)
BUN: 18 mg/dL (ref 8–23)
CO2: 28 mmol/L (ref 22–32)
Calcium: 8.4 mg/dL — ABNORMAL LOW (ref 8.9–10.3)
Chloride: 99 mmol/L (ref 98–111)
Creatinine, Ser: 0.93 mg/dL (ref 0.61–1.24)
GFR, Estimated: >60 mL/min (ref >60)
Glucose, Bld: 101 mg/dL — ABNORMAL HIGH (ref 70–99)
Potassium: 3.3 mmol/L — ABNORMAL LOW (ref 3.5–5.1)
Sodium: 136 mmol/L (ref 135–145)

## 2022-10-05 LAB — PHOSPHORUS: Phosphorus: 2.2 mg/dL — ABNORMAL LOW (ref 2.5–4.6)

## 2022-10-05 LAB — MAGNESIUM: Magnesium: 1.7 mg/dL (ref 1.7–2.4)

## 2022-10-05 MED ORDER — TAMSULOSIN HCL 0.4 MG PO CAPS: 0.4000 mg | ORAL_CAPSULE | Freq: Every day | ORAL | Status: DC

## 2022-10-05 MED ORDER — SENNOSIDES-DOCUSATE SODIUM 8.6-50 MG PO TABS: 2.0000 | ORAL_TABLET | Freq: Two times a day (BID) | ORAL | 5 refills | Status: DC

## 2022-10-05 MED ORDER — TAMSULOSIN HCL 0.4 MG PO CAPS: 0.4000 mg | ORAL_CAPSULE | Freq: Every day | ORAL | 3 refills | Status: DC

## 2022-10-05 MED ORDER — ACETAMINOPHEN 325 MG PO TABS: 650.0000 mg | ORAL_TABLET | Freq: Four times a day (QID) | ORAL | 1 refills | Status: DC | PRN

## 2022-10-05 MED ORDER — BISACODYL 10 MG RE SUPP: 10.0000 mg | RECTAL | 0 refills | Status: DC

## 2022-10-05 MED ORDER — HYDROMORPHONE HCL 1 MG/ML IJ SOLN
1.0000 mg | Freq: Once | INTRAMUSCULAR | Status: AC
  Administered 2022-10-05: 1 mg via INTRAVENOUS
  Filled 2022-10-05: qty 1

## 2022-10-05 NOTE — Progress Notes (Signed)
[facility] Progress Note  Subjective: Patient seen and examined.  He is resting comfortably in bed.  He tolerated his full liquids without nausea and vomiting.  His abdominal pain has resolved.  He was passing gas yesterday, and his last bowel movement was last night.   Objective: Vital signs in last 24 hours: Temp:  [98.7 F (37.1 C)-99 F (37.2 C)] 99 F (37.2 C) (12/28 0531) Pulse Rate:  [87-88] 88 (12/28 0531) Resp:  [16-20] 16 (12/28 0531) BP: (136-149)/(66-72) 136/72 (12/28 0531) SpO2:  [94 %-97 %] 97 % (12/28 0531) Last BM Date : 10/04/22  Intake/Output from previous day: 12/27 0701 - 12/28 0700 In: 240 [P.O.:240] Out: 1850 [Urine:1850] Intake/Output this shift: No intake/output data recorded.  General appearance: alert, cooperative, and no distress GI: Abdomen soft, nondistended, no percussion tenderness, nontender to palpation; no rigidity, guarding, rebound tenderness; left lower abdominal wall hernia is soft and reducible, nontender  Lab Results:  Recent Labs    10/04/22 0324 10/05/22 0312  WBC 13.7* 11.8*  HGB 8.6* 8.3*  HCT 27.8* 26.5*  PLT 322 294   BMET Recent Labs    10/04/22 0324 10/05/22 0312  NA 140 136  K 4.0 3.3*  CL 102 99  CO2 27 28  GLUCOSE 81 101*  BUN 25* 18  CREATININE 1.14 0.93  CALCIUM 8.7* 8.4*   PT/INR No results for input(s): "LABPROT", "INR" in the last 72 hours.  Studies/Results: DG Abd Portable 1V-Small Bowel Obstruction Protocol-initial, 8 hr delay  Result Date: 10/03/2022 CLINICAL DATA:  8 hour small-bowel follow-up film EXAM: PORTABLE ABDOMEN - 1 VIEW COMPARISON:  10/03/2022 FINDINGS: Administered contrast now lies throughout the small bowel as well as within the colon. Mild small bowel prominence remains. These findings are consistent with partial small bowel obstruction. The degree of small-bowel dilatation has improved slightly in the interval from prior CT. No free air is noted. No acute bony  abnormality is seen. IMPRESSION: Administered contrast lies within the small bowel and colon consistent with partial small bowel obstruction. The degree of small-bowel dilatation has improved in the interval from 10/01/2022 Electronically Signed   By: Inez Catalina M.D.   On: 10/03/2022 23:19   DG Abd Portable 1V-Small Bowel Protocol-Position Verification  Result Date: 10/03/2022 CLINICAL DATA:  Nasogastric placement EXAM: PORTABLE ABDOMEN - 1 VIEW COMPARISON:  10/01/2022 FINDINGS: Nasogastric tube tip is in the distal esophagus just above the diaphragm . IMPRESSION: Nasogastric tube tip in the distal esophagus just above the diaphragm. Electronically Signed   By: Nelson Chimes M.D.   On: 10/03/2022 13:41    Anti-infectives: Anti-infectives (From admission, onward)    Start     Dose/Rate Route Frequency Ordered Stop   10/02/22 1000  meropenem (MERREM) 2 g in sodium chloride 0.9 % 100 mL IVPB  Status:  Discontinued        2 g 280 mL/hr over 30 Minutes Intravenous Every 12 hours 10/02/22 0823 10/02/22 0844   10/02/22 1000  meropenem (MERREM) 1 g in sodium chloride 0.9 % 100 mL IVPB        1 g 200 mL/hr over 30 Minutes Intravenous Every 12 hours 10/02/22 0844 10/04/22 2155       Assessment/Plan:  Patient is an 81 year old male who was admitted with small bowel obstruction.  -His bowel obstruction has resolved with conservative measures -Advance to soft diet today -Continue bowel regimen with stool softener and MiraLAX.  Recommend the patient continue his bowel regimen as an outpatient  given his chronic opioid use -PRN pain control and antiemetics -Leukocytosis continuing to improve, 11.8 today -Foley catheter removed this morning, will see how patient does -If patient tolerates solid food, stable for discharge from general surgery standpoint -Appreciate hospitalist recommendations   LOS: 4 days    [place] 10/05/2022

## 2022-10-05 NOTE — Progress Notes (Signed)
Patient foley removed tolerated well. Urinal placed at bedside

## 2022-10-05 NOTE — Discharge Instructions (Signed)
1)Please follow-up with Urologist Dr. Alyson Ingles in about --- in 1 to 2 weeks for possible removal of Foley Catheter and for recheck and follow-up evaluation  in his office----[facility], [address], [place] [address] Phone Number----[phone]  2)Avoid Constipation---soft diet advised  3)Repeat CBC and BMP Blood Tests within 1 week---  4)Please follow-up with hematologist/oncologist Dr. Derek Jack, MD in about 1 week or so -Address: inside [facility] [place], [place], [place] [address] Phone: [phone]

## 2022-10-05 NOTE — Progress Notes (Signed)
14 Fr Foley placed per order , 400cc drained to catheter bag     10/05/22 1459  Urethral Catheter Oluwatobi Visser< LPN 14 Fr.  Placement Date/Time: 10/05/22 1442   Perineal care performed prior to insertion?: Yes  Person Inserting LDA: Yilia Sacca< LPN  Person Assisting with Catheter Insertion: Shenelle Blackwel  Catheter placed prior to periop?: No  Tube Size (Fr.): 14 Fr...  Output (mL) 400 mL

## 2022-10-05 NOTE — Discharge Summary (Signed)
Joshua Osborne, is a 81 y.o. male  DOB 07-17-1941  MRN [id number].  Admission date:  10/01/2022  Admitting Physician  Dwyane Dee, MD  Discharge Date:  10/05/2022   Primary MD  Earney Mallet, MD  Recommendations for primary care physician for things to follow:   1)Please follow-up with Urologist Dr. Alyson Ingles in about --- in 1 to 2 weeks for possible removal of Foley Catheter and for recheck and follow-up evaluation  in his office----[facility], [address], [place] [address] Phone Number----[phone]  2)Avoid Constipation---soft diet advised  3)Repeat CBC and BMP Blood Tests within 1 week---  4)Please follow-up with hematologist/oncologist Dr. Derek Jack, MD in about 1 week or so -Address: inside [facility] (4th Floor), [place], [place], [place] [address] Phone: [phone]  Admission Diagnosis  Small bowel obstruction [facility]) [K56.609]   Discharge Diagnosis  Small bowel obstruction ([place]) [I75.797]    Principal Problem:   Small bowel obstruction ([place]) Active Problems:   Transitional cell carcinoma of renal pelvis, left (HCC)   Dysuria   Acute urinary retention   Neck pain   Controlled type 2 diabetes mellitus without complication, without long-term current use of insulin (HCC)   Mixed hyperlipidemia   Chronic kidney disease, stage 3b ([place])      Past Medical History:  Diagnosis Date   Anemia    Anxiety    Arthritis    Bladder tumor    Cancer ([place])    hx of skin cancer, renal cancer   Diabetes mellitus without complication (HCC)    GERD (gastroesophageal reflux disease)    Heart murmur    as a child   History of kidney stones    Port-A-Cath in place 08/17/2022   Sleep apnea    cpap    Past Surgical History:  Procedure Laterality Date   bilateral dupreyen contracture surgery      CHOLECYSTECTOMY     CYSTOSCOPY N/A  05/30/2022   Procedure: CYSTOSCOPY WITH TRANSURETHRAL RESECTION OF THE LEFT URETERAL ORIFICE,  TRANSURETHRAL RESECTION OF BLADDER TUMOR;  Surgeon: Ceasar Mons, MD;  Location: [facility];  Service: Urology;  Laterality: N/A;   CYSTOSCOPY N/A 09/06/2022   Procedure: CYSTOSCOPY;  Surgeon: Cleon Gustin, MD;  Location: [facility];  Service: Urology;  Laterality: N/A;   CYSTOSCOPY WITH RETROGRADE PYELOGRAM, URETEROSCOPY AND STENT PLACEMENT Left 04/26/2022   Procedure: CYSTOSCOPY WITH RETROGRADE PYELOGRAM, URETEROSCOPY AND STENT PLACEMENT;  Surgeon: Cleon Gustin, MD;  Location: [facility];  Service: Urology;  Laterality: Left;   IR IMAGING GUIDED PORT INSERTION  08/16/2022   LIVER BIOPSY     ROBOT ASSITED LAPAROSCOPIC NEPHROURETERECTOMY Left 05/30/2022   Procedure: XI ROBOT ASSITED LAPAROSCOPIC NEPHROURETERECTOMY;  Surgeon: Ceasar Mons, MD;  Location: [facility];  Service: Urology;  Laterality: Left;   TRANSURETHRAL RESECTION OF BLADDER TUMOR  04/26/2022   Procedure: TRANSURETHRAL RESECTION OF BLADDER TUMOR (TURBT);  Surgeon: Cleon Gustin, MD;  Location: [facility];  Service: Urology;;  TRANSURETHRAL RESECTION OF BLADDER TUMOR N/A 09/06/2022   Procedure: TRANSURETHRAL RESECTION OF BLADDER TUMOR (TURBT);  Surgeon: Cleon Gustin, MD;  Location: [facility];  Service: Urology;  Laterality: N/A;       HPI  from the history and physical done on the day of admission:  Chief Complaint: abdominal pain, nausea   HPI:  Joshua Osborne is an 88 male with 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  He underwent left nephrectomy August 2023 with Dr. Gilford Rile.  He follows outpatient with oncology and has been treated with enfortumab and pembrolizumab.  He was recently started on Levaquin for a resistant UTI (ESBL Klebsiella) along with prednisone for some joint pains.  He completed antibiotics on 09/30/2022 and has  only been on prednisone for approximately 1 week.  He did notice some improvement in his joint pains. He also has a history of prior SBO in January 2023 treated at [facility].  This did not require any surgical intervention and resolved after NG tube placement and bowel rest.   He presented to the ER due to 2 to 3-day complaints of nausea and unable to tolerate food along with epigastric abdominal pain. CT abdomen/pelvis was performed which showed multiple dilated small bowel loops in the left hemiabdomen with concern for transition point in the right lower quadrant. He underwent NG tube placement in the ER and general surgery was consulted.  He is admitted for further workup and evaluation.   I have personally briefly reviewed patient's old medical records in [facility] and discussed patient with the ER provider when appropriate/indicated.     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  He underwent left nephrectomy August 2023 with Dr. Gilford Rile.  He follows outpatient with oncology and has been treated with enfortumab and pembrolizumab.  He was recently started on Levaquin for a resistant UTI (ESBL Klebsiella) along with prednisone for some joint pains.  He completed antibiotics on 09/30/2022 and has only been on prednisone for approximately 1 week.  He did notice some improvement in his joint pains. He also has a history of prior SBO in January 2023 treated at [facility].  This did not require any surgical intervention and resolved after NG tube placement and bowel rest.  He presented to the ER due to 2 to 3-day complaints of nausea and unable to tolerate food along with epigastric abdominal pain. CT abdomen/pelvis was performed which showed multiple dilated small bowel loops in the left hemiabdomen with concern for transition point in the right lower quadrant. He  underwent NG tube placement in the ER and general surgery was consulted.  He is admitted for further workup and evaluation.  Interval History:  -wife  at bedside, had at least 6 BMs since drinking Gastrografin for small bowel protocol study on 10/03/2022 -Complains of generalized pain, he is chronically on opiates at home     Assessment and Plan: 1) Small bowel obstruction ([place]) - hx of similar Jan 2023, treated in [facility] (no surgery needed, did need NGT), stayed in hospital 4-5 days; unable to review records however - presents with ~3 days poor appetite and epigastric abdominal pain  - CT shows "multiple mildly dilated loops of small bowel in the LEFT hemiabdomen with interdigitating fat stranding and trace fluid. This is new in  comparison to prior. Findings are concerning for small bowel obstruction. Transition point is favored to be in the RIGHT lower quadrant." - NGT inserted in ER; continued to [facility] -Discussed with Dr. Andris Flurry fromgeneral surgery  --had at least 6 BMs since drinking Gastrografin for small bowel protocol study on 10/03/2022 -NG tube removed on 10/04/22 Diet was advanced per general surgery recommendations -Patient tolerated advance diet well -No emesis,  -he is passing gas and having BMs -   Transitional cell carcinoma of renal pelvis, left ([place]) - follows with oncology; last seen 09/21/22 - has been on prednisone ~1 week for joint pains, hold steroid in setting of possible surgery - recent PET 08/03/22 showing new mets to mediastinal nodes and left supraclavicular node along with new small round pulmonary nodules -Continue treatment outpatient with oncology -Attempted to remove Foley catheter, patient failed voiding trial -Okay to keep Foley catheter and follow-up with urologist as outpatient   Dysuria - Patient recently completed course of Levaquin on 09/30/2022 for Klebsiella ESBL -This morning he is now complaining of recurrent burning with urination; he  does likely have a component of colonization but given his symptoms and leukocytosis (although was on steroids outpatient too) along with underlying malignancy and SBO, prudent for treating for now while in hospital --Urine culture from 10/02/2022 NGTD -Stopped meropenem on 10/04/2022   Acute urinary retention - worsening retention and dysuria on 10/02/22 - elevated PVR; foley inserted and patient had relief of symptoms - urine culture obtained prior to foley insertion;  --Attempted to remove Foley catheter, patient failed voiding trial -Okay to keep Foley catheter and follow-up with urologist as outpatient   Neck pain - Patient has pain and tenderness over the right trapezius muscle.  No vertebral spinal tenderness.  Also has no neuropathic findings including right upper extremity numbness, tingling, weakness - Suspect muscle pain/strain from being in bed however patient also endorses was told could be a side effect of his cancer treatment - Patient requesting imaging studies to evaluate - Reasonable to obtain x-ray.  Cervical spine x-ray showed mild degenerative disc disease at C6-7 and facet arthropathy most advanced at C2-3.  No acute findings - Continue supportive care; can try heating pad too -Restart chronic opiate at a lower dose than home dose due to GI concerns   Chronic kidney disease, stage 3b ([place]) - patient has history of CKD3b. Baseline creat ~ 1.6, eGFR 40 - currently at baseline   Mixed hyperlipidemia - hold home meds   Controlled type 2 diabetes mellitus without complication, without long-term current use of insulin (HCC) - -Resume PTA regimen  Discharge Condition: stable  Follow UP   Follow-up Information     McKenzie, Candee Furbish, MD. Schedule an appointment as soon as possible for a visit in 1 week(s).   Specialty: Urology Why: urinary Retention----Has Foley Contact information: [address] [place] [address] [phone]          Derek Jack, MD. Schedule an appointment as soon as possible for a visit in 1 week(s).   Specialty: Hematology Contact information: [place] [address] [phone]                  Consults obtained -General surgery/oncology  Diet and Activity recommendation:  As advised  Discharge Instructions   Discharge Instructions     Call MD for:  difficulty breathing, headache or visual disturbances   Complete by: As directed    Call MD for:  persistant dizziness or light-headedness   Complete by: As directed    Call MD for:  persistant nausea and vomiting   Complete by: As directed    Call MD for:  temperature >100.4   Complete by: As directed    Diet - low sodium heart healthy   Complete by: As directed    Discharge instructions   Complete by: As directed    1)Please follow-up with Urologist Dr. Alyson Ingles in about --- in 1 to 2 weeks for possible removal of Foley Catheter and for recheck and follow-up evaluation  in his office----[facility], [address], [place] [address] Phone Number----[phone]  2)Avoid Constipation---soft diet advised  3)Repeat CBC and BMP Blood Tests within 1 week---  4)Please follow-up with hematologist/oncologist Dr. Derek Jack, MD in about 1 week or so -Address: inside [facility] (4th Floor), [place], [place], [place] [address] Phone: [phone]   Increase activity slowly   Complete by: As directed        Discharge Medications     Allergies as of 10/05/2022   No Known Allergies      Medication List     STOP taking these medications    docusate sodium 100 MG capsule Commonly known as: Colace   levofloxacin 500 MG tablet Commonly known as: LEVAQUIN       TAKE these medications    acetaminophen 325 MG tablet Commonly known as: TYLENOL Take 2 tablets (650 mg total) by mouth every 6 (six) hours as needed for mild pain (or Fever >/= 101).   bisacodyl  10 MG suppository Commonly known as: DULCOLAX Place 1 suppository (10 mg total) rectally every Monday,Wednesday,Friday, and Sunday at 6 PM. Start taking on: October 06, 2022   citalopram 20 MG tablet Commonly known as: CELEXA Take 20 mg by mouth in the morning.   fluorouracil 5 % cream Commonly known as: EFUDEX Apply 1 Application topically 2 (two) times daily as needed (cancer spots).   KEYTRUDA IV Inject 1 Dose into the vein See admin instructions. Inject into the vein every 21 days   latanoprost 0.005 % ophthalmic solution Commonly known as: XALATAN Place 1 drop into both eyes at bedtime.   lidocaine-prilocaine cream Commonly known as: EMLA Apply a small amount to port a cath site and cover with plastic wrap 1 hour prior to infusion What changed:  how much to take how to take this when to take this reasons to take this additional instructions   metFORMIN 500 MG tablet Commonly known as: GLUCOPHAGE Take 500 mg by mouth 2 (two) times daily.   metoCLOPramide 10 MG tablet Commonly known as: REGLAN Take 1 tablet (10 mg total) by mouth every 6 (six) hours as needed for nausea or vomiting.   OLANZapine 5 MG tablet Commonly known as: ZyPREXA Take 1 tablet (5 mg total) by mouth at bedtime.   omeprazole 20 MG capsule Commonly known as: PRILOSEC Take 20 mg by mouth 2 (two) times daily.   ondansetron 8 MG disintegrating tablet Commonly known as: ZOFRAN-ODT Take 1 tablet (8 mg total) by mouth every 8 (eight) hours as needed for nausea or vomiting.   Oxycodone HCl 10 MG Tabs Take 1 tablet (10 mg total) by mouth every 8 (eight) hours as needed. What changed: reasons to take this   PADCEV IV Inject 1 Dose into the vein See admin instructions. Inject 1 dose into the vein on days 1 and 8 every 21 days  predniSONE 10 MG tablet Commonly known as: DELTASONE Take 1 tablet (10 mg total) by mouth daily with breakfast. What changed: additional instructions   prochlorperazine  10 MG tablet Commonly known as: COMPAZINE Take 1 tablet (10 mg total) by mouth every 6 (six) hours as needed for nausea or vomiting.   promethazine 25 MG suppository Commonly known as: PHENERGAN Place 1 suppository (25 mg total) rectally every 6 (six) hours as needed for nausea or vomiting.   senna-docusate 8.6-50 MG tablet Commonly known as: Senokot-S Take 2 tablets by mouth 2 (two) times daily.   simvastatin 40 MG tablet Commonly known as: ZOCOR Take 40 mg by mouth at bedtime.   tamsulosin 0.4 MG Caps capsule Commonly known as: FLOMAX Take 1 capsule (0.4 mg total) by mouth daily after supper. What changed: when to take this   timolol 0.5 % ophthalmic solution Commonly known as: BETIMOL Place 1 drop into both eyes in the morning.   tiZANidine 2 MG tablet Commonly known as: ZANAFLEX Take 2 mg by mouth at bedtime.       Major procedures and Radiology Reports - PLEASE review detailed and final reports for all details, in brief -   DG Abd Portable 1V-Small Bowel Obstruction Protocol-initial, 8 hr delay  Result Date: 10/03/2022 CLINICAL DATA:  8 hour small-bowel follow-up film EXAM: PORTABLE ABDOMEN - 1 VIEW COMPARISON:  10/03/2022 FINDINGS: Administered contrast now lies throughout the small bowel as well as within the colon. Mild small bowel prominence remains. These findings are consistent with partial small bowel obstruction. The degree of small-bowel dilatation has improved slightly in the interval from prior CT. No free air is noted. No acute bony abnormality is seen. IMPRESSION: Administered contrast lies within the small bowel and colon consistent with partial small bowel obstruction. The degree of small-bowel dilatation has improved in the interval from 10/01/2022 Electronically Signed   By: Inez Catalina M.D.   On: 10/03/2022 23:19   DG Abd Portable 1V-Small Bowel Protocol-Position Verification  Result Date: 10/03/2022 CLINICAL DATA:  Nasogastric placement EXAM:  PORTABLE ABDOMEN - 1 VIEW COMPARISON:  10/01/2022 FINDINGS: Nasogastric tube tip is in the distal esophagus just above the diaphragm . IMPRESSION: Nasogastric tube tip in the distal esophagus just above the diaphragm. Electronically Signed   By: Nelson Chimes M.D.   On: 10/03/2022 13:41   DG Cervical Spine 2 or 3 views  Result Date: 10/03/2022 CLINICAL DATA:  Neck pain EXAM: CERVICAL SPINE - 2-3 VIEW COMPARISON:  None Available. FINDINGS: There is no evidence of cervical spine fracture or prevertebral soft tissue swelling. Alignment is normal. Mild degenerative disc disease at C6-7. Multilevel facet arthropathy, most advanced at the C2-3 level. Partially imaged nasogastric tube and right IJ central line. Carotid atherosclerosis. 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 No acute findings. Electronically Signed   By: Davina Poke D.O.   On: 10/03/2022 10:58   DG Abd Portable 1 View  Result Date: 10/01/2022 CLINICAL DATA:  Evaluate NG placement EXAM: PORTABLE ABDOMEN - 1 VIEW COMPARISON:  Earlier today FINDINGS: There is a enteric tube with tip and side port below the GE junction within the gastric fundus. Mildly dilated loops of small bowel are noted in the left hemiabdomen. IMPRESSION: Enteric tube tip and side port below the GE junction within the gastric fundus. Electronically Signed   By: Kerby Moors M.D.   On: 10/01/2022 13:58   DG Abd Portable 1 View  Result Date: 10/01/2022 CLINICAL DATA:  NG tube placement. EXAM: PORTABLE  ABDOMEN - 1 VIEW COMPARISON:  None Available. FINDINGS: NG tube tip is in the mid stomach with proximal side port at or just below the GE junction. Mild gaseous small bowel distension noted in the left abdomen. IMPRESSION: NG tube tip is in the mid stomach with proximal side port at or just below the GE junction. NG tube could be advanced approximately 3 cm to ensure side port is below the GE junction. Electronically Signed   By: Misty Stanley M.D.    On: 10/01/2022 13:10   CT ABDOMEN PELVIS WO CONTRAST  Result Date: 10/01/2022 CLINICAL DATA:  Abdominal pain, acute, nonlocalized. History of metastatic bladder cancer. 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 EXAM: CT ABDOMEN AND PELVIS WITHOUT CONTRAST TECHNIQUE: Multidetector CT imaging of the abdomen and pelvis was performed following the standard protocol without IV contrast. RADIATION DOSE REDUCTION: This exam was performed according to the departmental dose-optimization program which includes automated exposure control, adjustment of the mA and/or kV according to patient size and/or use of iterative reconstruction technique. * Tracking Code: BO * COMPARISON:  September 20, 2022; August 03 2022, April 25, 2022 FINDINGS: Evaluation is limited by lack of IV contrast. Lower chest: Revisualization of multiple pulmonary nodules. Representative RIGHT lower lobe pulmonary nodule measures 8 mm, not significantly changed in comparison to most recent prior (series 4, image 16). Additional representative RIGHT middle lobe subpleural pulmonary nodule measures 11 by 7 mm, similar dating back to October 2023 given differences in technique but newly conspicuous since July (series 4, image 9). Hepatobiliary: Multiple coarse calcifications of the liver. Status post cholecystectomy. Similar dilation of the common bile duct to approximately 10 mm, consistent with post cholecystectomy state. Pancreas: No peripancreatic fat stranding. Spleen: Unremarkable. Adrenals/Urinary Tract: Adrenal glands are unremarkable. Status post LEFT nephrectomy. No new nodularity identified within the LEFT nephrectomy bed. No RIGHT-sided hydronephrosis. No obstructing RIGHT-sided nephrolithiasis. Bladder is mildly distended. Stomach/Bowel: There are multiple mildly dilated loops of small bowel in the LEFT hemiabdomen. Representative loopd of bowel measures 3.8 cm in diameter. There is interdigitating fat stranding  and trace fluid. No definitive pneumatosis. Transition point is favored to be in the RIGHT lower quadrant loop of small bowel (series 5, image 60; series 2, image 49). Diverticulosis throughout the sigmoid colon. Moderate predominately RIGHT-sided colonic stool burden. Appendix is normal. Vascular/Lymphatic: Atherosclerotic calcifications of the nonaneurysmal abdominal aorta. LEFT periaortic lymph node measuring 1.1 cm in the short axis is similar in appearance in comparison to prior (series 2, image 35). Additional ill-defined LEFT periaortic lymph node measures 10 mm in the short axis, unchanged (series 2, image 32). Reproductive: Prostatomegaly. Other: Small volume free fluid in the pelvis. Musculoskeletal: Degenerative changes of the lumbar spine. No new aggressive osseous lesions identified. Grade 1 anterolisthesis of L4-5. IMPRESSION: 1. There are multiple mildly dilated loops of small bowel in the LEFT hemiabdomen with interdigitating fat stranding and trace fluid. This is new in comparison to prior. Findings are concerning for small bowel obstruction. Transition point is favored to be in the RIGHT lower quadrant. 2. Small volume free fluid in the pelvis, nonspecific and possibly reactive. Evaluation for peritoneal nodularity is limited by lack of IV contrast. 3. Revisualization of multiple pulmonary nodules, most consistent with metastatic disease. 4. Similar appearance of retroperitoneal adenopathy. Aortic Atherosclerosis (ICD10-I70.0). Electronically Signed   By: Valentino Saxon M.D.   On: 10/01/2022 10:39   CT Renal Stone Study  Result Date: 09/20/2022 CLINICAL DATA:  Metastatic bladder cancer. History of  renal cell carcinoma left nephrectomy. Abdominal and flank pain with weakness and body aches for 2 weeks. Currently on antibiotic therapy for urinary tract infection. * Tracking Code: BO * EXAM: CT ABDOMEN AND PELVIS WITHOUT CONTRAST TECHNIQUE: Multidetector CT imaging of the abdomen and pelvis  was performed following the standard protocol without IV contrast. RADIATION DOSE REDUCTION: This exam was performed according to the departmental dose-optimization program which includes automated exposure control, adjustment of the mA and/or kV according to patient size and/or use of iterative reconstruction technique. COMPARISON:  Multiple exams, including PET-CT 08/03/2022 and CT abdomen from 04/25/2022 FINDINGS: Lower chest: Bibasilar pulmonary nodules are observed. Pleural-based right middle lobe nodule measures 1.4 by 0.8 cm on image 7 series 4, previously 1.1 by 0.7 cm on 08/03/2022. A right lower lobe nodule measures 0.9 by 0.8 cm on image 6 series 4, previously 0.7 by 0.6 cm. Other nodules appear roughly similar to previous. Descending thoracic aortic and circumflex coronary artery atherosclerotic calcification. Hepatobiliary: Stable calcified liver lesions, not previously hypermetabolic. Cholecystectomy. No biliary dilatation. Pancreas: Unremarkable Spleen: Unremarkable Adrenals/Urinary Tract: Left nephrectomy. Adrenal glands and right kidney unremarkable in noncontrast CT appearance. There is gas in the urinary bladder. Abnormal irregular wall thickening in the urinary bladder especially along the left inferior margin. This may reflect bladder tumor. Stomach/Bowel: Sigmoid colon diverticulosis. Vascular/Lymphatic: Atherosclerosis is present, including aortoiliac atherosclerotic disease. There is substantial atheromatous calcification at the origin of the celiac trunk and SMA and tracking within the proximal SMA. Vascular patency not assessed. Previously hypermetabolic left posterior periaortic adenopathy 1.1 cm in short axis on image 36 series 2, stable. Reproductive: Marked prostatomegaly, with slightly hazy margins potentially due to local therapy or prostatitis. Other: Broad left Spigelian hernia contains small bowel, image 52 series 2, no strangulation or obstruction. Musculoskeletal: Degenerative  bilateral hip arthropathy. Grade 1 anterolisthesis of L4 on L5 with left pars defect at L4. In conjunction with lumbar spondylosis and degenerative disc disease there is bilateral foraminal impingement at the L4-5 and L5-S1 Osborne. Small supraumbilical hernia contains adipose tissue on image 68 series 6. IMPRESSION: 1. Abnormal irregular wall thickening in the urinary bladder especially along the left inferior margin, compatible with bladder tumor. There is gas in the urinary bladder which could be from recent instrumentation or from gas-forming organism. 2. Bibasilar pulmonary nodules, some of which have mildly enlarged compared to 08/03/2022, previously hypermetabolic, compatible with metastatic disease. 3. Stable 1.1 cm left posterior periaortic lymph node, previously hypermetabolic. 4. Marked prostatomegaly, with slightly hazy margins potentially due to local therapy or prostatitis. 5. Broad left Spigelian hernia contains small bowel, no strangulation or obstruction. 6. Lower lumbar impingement. 7. Sigmoid colon diverticulosis. 8. Marked atheromatous calcification at the origin of the celiac trunk and SMA. Vascular patency not assessed on today's noncontrast exam. 9. Small supraumbilical hernia contains adipose tissue. 10. Degenerative bilateral hip arthropathy. Grade 1 anterolisthesis of L4 on L5 with left pars defect at L4. In conjunction with lumbar spondylosis and degenerative disc disease, there is bilateral foraminal impingement at L4-5 and L5-S1. 11. Stable calcified liver lesions, not previously hypermetabolic. 12. Aortic atherosclerosis. Aortic Atherosclerosis (ICD10-I70.0). Electronically Signed   By: Van Clines M.D.   On: 09/20/2022 18:12    Micro Results  Recent Results (from the past 240 hour(s))  Urine Culture     Status: None   Collection Time: 10/02/22  9:58 AM   Specimen: Urine, Clean Catch  Result Value Ref Range Status   Specimen Description   Final  URINE, CLEAN  CATCH Performed at [facility], [address], [place], [place] [address]    Special Requests   Final    NONE Performed at [facility], [address], [place], [place] [address]    Culture   Final    NO GROWTH Performed at [facility], [place] [address] [place], [place] [address]    Report Status 10/03/2022 FINAL  Final    Today   Subjective    Joshua Osborne today has no new complaints - Wife at bedside, questions answered Tolerating oral intake well -Passing gas and had BM   Patient has been seen and examined prior to discharge   Objective   Blood pressure (!) 140/60, pulse 78, temperature 98.2 F (36.8 C), temperature source Oral, resp. rate 18, height 6' (1.829 m), weight 73.9 kg, SpO2 97 %.   Intake/Output Summary (Last 24 hours) at 10/05/2022 1601 Last data filed at 10/05/2022 1459 Gross per 24 hour  Intake 240 ml  Output 1550 ml  Net -1310 ml    Exam Gen:- Awake Alert, in no acute distress  HEENT:- [place].AT, No sclera icterus Nose-NG tube removed Neck-Supple Neck,No JVD,.  Lungs-  CTAB , fair air movement bilaterally  CV- S1, S2 normal, RRR, Rt sided  Port-A-Cath site to Abd-  +ve B.Sounds, Abd Soft, no tenderness, abdomen is not distended, left lower quadrant postoperative hernia extremity/Skin:- No  edema,   good pedal pulses  Psych-affect is appropriate, oriented x3 Neuro-no new focal deficits, no tremors  GU-Foley in situ   Data Review   CBC w Diff:  Lab Results  Component Value Date   WBC 11.8 (H) 10/05/2022   HGB 8.3 (L) 10/05/2022   HGB 8.8 (L) 06/30/2022   HCT 26.5 (L) 10/05/2022   PLT 294 10/05/2022   PLT 363 06/30/2022   LYMPHOPCT 10 10/05/2022   MONOPCT 11 10/05/2022   EOSPCT 2 10/05/2022   BASOPCT 1 10/05/2022    CMP:  Lab Results  Component Value Date   NA 136 10/05/2022   NA 135 09/25/2022   K 3.3 (L) 10/05/2022   CL 99 10/05/2022   CO2 28 10/05/2022   BUN 18 10/05/2022   BUN 20 09/25/2022   CREATININE 0.93  10/05/2022   CREATININE 1.30 (H) 06/30/2022   PROT 8.8 (H) 10/01/2022   ALBUMIN 4.3 10/01/2022   BILITOT 0.8 10/01/2022   BILITOT 0.4 06/30/2022   ALKPHOS 81 10/01/2022   AST 22 10/01/2022   AST 10 (L) 06/30/2022   ALT 15 10/01/2022   ALT 8 06/30/2022  .  Total Discharge time is about 33 minutes  Roxan Hockey M.D on 10/05/2022 at 4:01 PM  Go to [email or web address] -  for contact info  [facility] - Office  [phone]

## 2022-10-05 NOTE — Evaluation (Signed)
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  He underwent left nephrectomy August 2023 with Dr. Gilford Rile.  He follows outpatient with oncology and has been treated with enfortumab and pembrolizumab.   He was recently started on Levaquin for a resistant UTI (ESBL Klebsiella) along with prednisone for some joint pains.  He completed antibiotics on 09/30/2022 and has only been on prednisone for approximately 1 week.  He did notice some improvement in his joint pains.  He also has a history of prior SBO in January 2023 treated at [facility].  This did not require any surgical intervention and resolved after NG tube placement and bowel rest.     He presented to the ER due to 2 to 3-day complaints of nausea and unable to tolerate food along with epigastric abdominal pain.  CT abdomen/pelvis was performed which showed multiple dilated small bowel loops in the left hemiabdomen with concern for transition point in the right lower quadrant.  He underwent NG tube placement in the ER and general surgery was consulted.  He is admitted for further workup and evaluation.   Clinical Impression  Patient functioning near baseline for functional mobility and gait demonstrating good return for bed mobility, transfers and ambulating in room/hallway without loss of balance without using an AD.  Patient encouraged to ambulate with nursing staff and family members supervising for length of stay.  Plan:  Patient discharged from physical therapy to care of nursing for ambulation daily as tolerated for length of stay.         Recommendations for follow up therapy are one component of a multi-disciplinary discharge planning process, led by the attending physician.   Recommendations may be updated based on patient status, additional functional criteria and insurance authorization.  Follow Up Recommendations No PT follow up      Assistance Recommended at Discharge Set up Supervision/Assistance  Patient can return home with the following  Help with stairs or ramp for entrance;Assistance with cooking/housework    Equipment Recommendations None recommended by PT  Recommendations for Other Services       Functional Status Assessment Patient has had a recent decline in their functional status and/or demonstrates limited ability to make significant improvements in function in a reasonable and predictable amount of time     Precautions / Restrictions Precautions Precautions: None Restrictions Weight Bearing Restrictions: No      Mobility  Bed Mobility Overal bed mobility: Modified Independent                  Transfers Overall transfer level: Modified independent                      Ambulation/Gait Ambulation/Gait assistance: Modified independent (Device/Increase time) Gait Distance (Feet): 120 Feet Assistive device: None Gait Pattern/deviations: Decreased step length - right, Decreased step length - left, Decreased stride length, Drifts right/left Gait velocity: decreased     General Gait Details: slightly labored cadence with good return for ambulating in room and hallway without loss of balance  Stairs            Wheelchair Mobility    Modified Rankin (Stroke Patients Only)       Balance Overall balance assessment: Mild deficits observed, not formally tested  Pertinent Vitals/Pain Pain Assessment Pain Assessment: No/denies pain    Home Living Family/patient expects to be discharged to:: Private residence Living Arrangements: Spouse/significant other Available Help at Discharge: Family;Available 24 hours/day Type of Home: House Home  Access: Level entry       Home Layout: Able to live on main level with bedroom/bathroom;Two level Home Equipment: Cane - quad;Cane - single point;BSC/3in1;Shower seat      Prior Function Prior Level of Function : Driving;Independent/Modified Independent             Mobility Comments: household and short distanced community ambulator using RW PRN ADLs Comments: assisted by family     Hand Dominance        Extremity/Trunk Assessment   Upper Extremity Assessment Upper Extremity Assessment: Overall WFL for tasks assessed    Lower Extremity Assessment Lower Extremity Assessment: Overall WFL for tasks assessed    Cervical / Trunk Assessment Cervical / Trunk Assessment: Normal  Communication   Communication: No difficulties  Cognition Arousal/Alertness: Awake/alert Behavior During Therapy: WFL for tasks assessed/performed Overall Cognitive Status: Within Functional Limits for tasks assessed                                          General Comments      Exercises     Assessment/Plan    PT Assessment Patient does not need any further PT services  PT Problem List         PT Treatment Interventions      PT Goals (Current goals can be found in the Care Plan section)  Acute Rehab PT Goals Patient Stated Goal: return home with family to assist PT Goal Formulation: With patient/family Time For Goal Achievement: 10/05/22 Potential to Achieve Goals: Good    Frequency       Co-evaluation               AM-PAC PT "6 Clicks" Mobility  Outcome Measure Help needed turning from your back to your side while in a flat bed without using bedrails?: None Help needed moving from lying on your back to sitting on the side of a flat bed without using bedrails?: None Help needed moving to and from a bed to a chair (including a wheelchair)?: None Help needed standing up from a chair using your arms (e.g., wheelchair or bedside chair)?: None Help needed  to walk in hospital room?: None Help needed climbing 3-5 steps with a railing? : A Little 6 Click Score: 23    End of Session   Activity Tolerance: Patient tolerated treatment well Patient left: in bed;with call bell/phone within reach;with family/visitor present Nurse Communication: Mobility status PT Visit Diagnosis: Unsteadiness on feet (R26.81);Other abnormalities of gait and mobility (R26.89);Muscle weakness (generalized) (M62.81)    Time: 1135-1150 PT Time Calculation (min) (ACUTE ONLY): 15 min   Charges:   PT Evaluation $PT Eval Low Complexity: 1 Low PT Treatments $Therapeutic Activity: 8-22 mins        12:27 PM, 10/05/22 Lonell Grandchild, MPT Physical Therapist with [facility] [phone] office [phone] mobile phone

## 2022-10-05 NOTE — Care Management Important Message (Signed)
Important Message  Patient Details  Name: Joshua Osborne MRN: [id number] Date of Birth: [birth year]   Medicare Important Message Given:  Yes     Tommy Medal 10/05/2022, 3:10 PM

## 2022-10-05 NOTE — Progress Notes (Signed)
Patient voided , post residual urine in bladder was 366m . Notified Dr. CJoesph Fillers

## 2022-10-10 ENCOUNTER — Encounter: Payer: Self-pay | Admitting: *Deleted

## 2022-10-10 ENCOUNTER — Telehealth: Payer: Self-pay

## 2022-10-10 MED ORDER — SILODOSIN 8 MG PO CAPS: 8.0000 mg | ORAL_CAPSULE | Freq: Every day | ORAL | 3 refills | Status: DC

## 2022-10-10 NOTE — Progress Notes (Signed)
Wheelchair ordered via [employer] for delivery to patient to assist in mobility and decrease risk of falls.

## 2022-10-10 NOTE — Telephone Encounter (Signed)
Patient discharged recently from hospital- foley catheter reinserted after failing voiding trial in hospital.  Reviewed with Dr. Alyson Ingles, patient currently on flomax with several reoccurrences of foley catheter reinsertions. Per Dr. Alyson Ingles, stop flomax and start rapaflo. 1 week voiding trial  I called wife and reviewed medication changes. Wife voiced understanding and prescription sent to pharmacy.

## 2022-10-11 ENCOUNTER — Other Ambulatory Visit: Payer: Self-pay | Admitting: *Deleted

## 2022-10-11 ENCOUNTER — Encounter: Payer: Self-pay | Admitting: *Deleted

## 2022-10-11 MED ORDER — OXYCODONE HCL 10 MG PO TABS: 10.0000 mg | ORAL_TABLET | Freq: Four times a day (QID) | ORAL | 0 refills | Status: DC | PRN

## 2022-10-12 ENCOUNTER — Other Ambulatory Visit: Payer: MEDICARE

## 2022-10-12 ENCOUNTER — Ambulatory Visit: Payer: MEDICARE

## 2022-10-17 ENCOUNTER — Inpatient Hospital Stay: Payer: MEDICARE | Admitting: Hematology

## 2022-10-17 ENCOUNTER — Other Ambulatory Visit: Payer: MEDICARE

## 2022-10-17 ENCOUNTER — Inpatient Hospital Stay: Payer: MEDICARE

## 2022-10-17 ENCOUNTER — Encounter: Payer: Self-pay | Admitting: Hematology

## 2022-10-17 ENCOUNTER — Inpatient Hospital Stay (HOSPITAL_BASED_OUTPATIENT_CLINIC_OR_DEPARTMENT_OTHER): Payer: MEDICARE | Admitting: Hematology

## 2022-10-17 ENCOUNTER — Inpatient Hospital Stay: Payer: MEDICARE | Attending: Oncology

## 2022-10-17 VITALS — BP 118/67 | HR 100 | Temp 98.0°F | Resp 18

## 2022-10-17 DIAGNOSIS — Z801 Family history of malignant neoplasm of trachea, bronchus and lung: Secondary | ICD-10-CM | POA: Insufficient documentation

## 2022-10-17 DIAGNOSIS — Z95828 Presence of other vascular implants and grafts: Secondary | ICD-10-CM | POA: Diagnosis not present

## 2022-10-17 DIAGNOSIS — M47812 Spondylosis without myelopathy or radiculopathy, cervical region: Secondary | ICD-10-CM | POA: Insufficient documentation

## 2022-10-17 DIAGNOSIS — M5116 Intervertebral disc disorders with radiculopathy, lumbar region: Secondary | ICD-10-CM | POA: Insufficient documentation

## 2022-10-17 DIAGNOSIS — I700 Atherosclerosis of aorta: Secondary | ICD-10-CM | POA: Insufficient documentation

## 2022-10-17 DIAGNOSIS — K5730 Diverticulosis of large intestine without perforation or abscess without bleeding: Secondary | ICD-10-CM | POA: Diagnosis not present

## 2022-10-17 DIAGNOSIS — M4316 Spondylolisthesis, lumbar region: Secondary | ICD-10-CM | POA: Insufficient documentation

## 2022-10-17 DIAGNOSIS — N390 Urinary tract infection, site not specified: Secondary | ICD-10-CM | POA: Diagnosis not present

## 2022-10-17 DIAGNOSIS — Z87442 Personal history of urinary calculi: Secondary | ICD-10-CM | POA: Insufficient documentation

## 2022-10-17 DIAGNOSIS — M50323 Other cervical disc degeneration at C6-C7 level: Secondary | ICD-10-CM | POA: Diagnosis not present

## 2022-10-17 DIAGNOSIS — Z7952 Long term (current) use of systemic steroids: Secondary | ICD-10-CM | POA: Insufficient documentation

## 2022-10-17 DIAGNOSIS — R200 Anesthesia of skin: Secondary | ICD-10-CM | POA: Diagnosis not present

## 2022-10-17 DIAGNOSIS — Z5112 Encounter for antineoplastic immunotherapy: Secondary | ICD-10-CM | POA: Diagnosis present

## 2022-10-17 DIAGNOSIS — Z87891 Personal history of nicotine dependence: Secondary | ICD-10-CM | POA: Insufficient documentation

## 2022-10-17 DIAGNOSIS — M4726 Other spondylosis with radiculopathy, lumbar region: Secondary | ICD-10-CM | POA: Insufficient documentation

## 2022-10-17 DIAGNOSIS — M549 Dorsalgia, unspecified: Secondary | ICD-10-CM | POA: Diagnosis not present

## 2022-10-17 DIAGNOSIS — C7800 Secondary malignant neoplasm of unspecified lung: Secondary | ICD-10-CM | POA: Diagnosis not present

## 2022-10-17 DIAGNOSIS — C778 Secondary and unspecified malignant neoplasm of lymph nodes of multiple regions: Secondary | ICD-10-CM | POA: Diagnosis not present

## 2022-10-17 DIAGNOSIS — E119 Type 2 diabetes mellitus without complications: Secondary | ICD-10-CM | POA: Insufficient documentation

## 2022-10-17 DIAGNOSIS — C679 Malignant neoplasm of bladder, unspecified: Secondary | ICD-10-CM | POA: Diagnosis not present

## 2022-10-17 DIAGNOSIS — Z85828 Personal history of other malignant neoplasm of skin: Secondary | ICD-10-CM | POA: Insufficient documentation

## 2022-10-17 DIAGNOSIS — N400 Enlarged prostate without lower urinary tract symptoms: Secondary | ICD-10-CM | POA: Insufficient documentation

## 2022-10-17 DIAGNOSIS — R634 Abnormal weight loss: Secondary | ICD-10-CM

## 2022-10-17 DIAGNOSIS — I6529 Occlusion and stenosis of unspecified carotid artery: Secondary | ICD-10-CM | POA: Insufficient documentation

## 2022-10-17 DIAGNOSIS — G8929 Other chronic pain: Secondary | ICD-10-CM | POA: Insufficient documentation

## 2022-10-17 DIAGNOSIS — D72828 Other elevated white blood cell count: Secondary | ICD-10-CM | POA: Diagnosis not present

## 2022-10-17 DIAGNOSIS — G479 Sleep disorder, unspecified: Secondary | ICD-10-CM | POA: Diagnosis not present

## 2022-10-17 DIAGNOSIS — M5137 Other intervertebral disc degeneration, lumbosacral region: Secondary | ICD-10-CM | POA: Diagnosis not present

## 2022-10-17 DIAGNOSIS — C652 Malignant neoplasm of left renal pelvis: Secondary | ICD-10-CM | POA: Diagnosis present

## 2022-10-17 DIAGNOSIS — R110 Nausea: Secondary | ICD-10-CM | POA: Insufficient documentation

## 2022-10-17 DIAGNOSIS — Z905 Acquired absence of kidney: Secondary | ICD-10-CM | POA: Insufficient documentation

## 2022-10-17 DIAGNOSIS — D509 Iron deficiency anemia, unspecified: Secondary | ICD-10-CM | POA: Diagnosis not present

## 2022-10-17 DIAGNOSIS — K838 Other specified diseases of biliary tract: Secondary | ICD-10-CM | POA: Diagnosis not present

## 2022-10-17 DIAGNOSIS — Z9049 Acquired absence of other specified parts of digestive tract: Secondary | ICD-10-CM | POA: Insufficient documentation

## 2022-10-17 DIAGNOSIS — Z79899 Other long term (current) drug therapy: Secondary | ICD-10-CM | POA: Insufficient documentation

## 2022-10-17 DIAGNOSIS — G4700 Insomnia, unspecified: Secondary | ICD-10-CM | POA: Insufficient documentation

## 2022-10-17 LAB — COMPREHENSIVE METABOLIC PANEL
ALT: 29 U/L (ref 0–44)
AST: 20 U/L (ref 15–41)
Albumin: 3.7 g/dL (ref 3.5–5.0)
Alkaline Phosphatase: 80 U/L (ref 38–126)
Anion gap: 9 (ref 5–15)
BUN: 28 mg/dL — ABNORMAL HIGH (ref 8–23)
CO2: 29 mmol/L (ref 22–32)
Calcium: 9.3 mg/dL (ref 8.9–10.3)
Chloride: 97 mmol/L — ABNORMAL LOW (ref 98–111)
Creatinine, Ser: 1.23 mg/dL (ref 0.61–1.24)
GFR, Estimated: 59 mL/min — ABNORMAL LOW (ref >60)
Glucose, Bld: 124 mg/dL — ABNORMAL HIGH (ref 70–99)
Potassium: 4.4 mmol/L (ref 3.5–5.1)
Sodium: 135 mmol/L (ref 135–145)
Total Bilirubin: 0.7 mg/dL (ref 0.3–1.2)
Total Protein: 7.2 g/dL (ref 6.5–8.1)

## 2022-10-17 LAB — CBC WITH DIFFERENTIAL/PLATELET
Abs Immature Granulocytes: 0.35 10*3/uL — ABNORMAL HIGH (ref 0.00–0.07)
Basophils Absolute: 0.1 10*3/uL (ref 0.0–0.1)
Basophils Relative: 1 %
Eosinophils Absolute: 0.2 10*3/uL (ref 0.0–0.5)
Eosinophils Relative: 1 %
HCT: 31.4 % — ABNORMAL LOW (ref 39.0–52.0)
Hemoglobin: 9.7 g/dL — ABNORMAL LOW (ref 13.0–17.0)
Immature Granulocytes: 2 %
Lymphocytes Relative: 9 %
Lymphs Abs: 1.4 10*3/uL (ref 0.7–4.0)
MCH: 19.8 pg — ABNORMAL LOW (ref 26.0–34.0)
MCHC: 30.9 g/dL (ref 30.0–36.0)
MCV: 64 fL — ABNORMAL LOW (ref 80.0–100.0)
Monocytes Absolute: 0.8 10*3/uL (ref 0.1–1.0)
Monocytes Relative: 5 %
Neutro Abs: 12.4 10*3/uL — ABNORMAL HIGH (ref 1.7–7.7)
Neutrophils Relative %: 82 %
Platelets: 433 10*3/uL — ABNORMAL HIGH (ref 150–400)
RBC: 4.91 MIL/uL (ref 4.22–5.81)
RDW: 16.8 % — ABNORMAL HIGH (ref 11.5–15.5)
WBC: 15.2 10*3/uL — ABNORMAL HIGH (ref 4.0–10.5)
nRBC: 0 % (ref 0.0–0.2)

## 2022-10-17 LAB — MAGNESIUM: Magnesium: 1.6 mg/dL — ABNORMAL LOW (ref 1.7–2.4)

## 2022-10-17 MED ORDER — SODIUM CHLORIDE 0.9 % IV SOLN
200.0000 mg | Freq: Once | INTRAVENOUS | Status: AC
  Administered 2022-10-17: 200 mg via INTRAVENOUS
  Filled 2022-10-17: qty 8

## 2022-10-17 MED ORDER — MAGNESIUM SULFATE 2 GM/50ML IV SOLN
2.0000 g | Freq: Once | INTRAVENOUS | Status: AC
  Administered 2022-10-17: 2 g via INTRAVENOUS
  Filled 2022-10-17: qty 50

## 2022-10-17 MED ORDER — HEPARIN SOD (PORK) LOCK FLUSH 100 UNIT/ML IV SOLN
500.0000 [IU] | Freq: Once | INTRAVENOUS | Status: AC | PRN
  Administered 2022-10-17: 500 [IU]

## 2022-10-17 MED ORDER — SODIUM CHLORIDE 0.9% FLUSH
10.0000 mL | INTRAVENOUS | Status: DC | PRN
  Administered 2022-10-17: 10 mL via INTRAVENOUS

## 2022-10-17 MED ORDER — SODIUM CHLORIDE 0.9 % IV SOLN: Freq: Once | INTRAVENOUS | Status: AC

## 2022-10-17 MED ORDER — SODIUM CHLORIDE 0.9% FLUSH
10.0000 mL | INTRAVENOUS | Status: DC | PRN
  Administered 2022-10-17: 10 mL

## 2022-10-17 MED ORDER — SODIUM CHLORIDE 0.9 % IV SOLN: INTRAVENOUS | Status: DC

## 2022-10-17 NOTE — Patient Instructions (Signed)
[facility]  Discharge Instructions: Thank you for choosing [facility] to provide your oncology and hematology care.  If you have a lab appointment with the [facility], please come in thru the Main Entrance and check in at the main information desk.  Wear comfortable clothing and clothing appropriate for easy access to any Portacath or PICC line.   We strive to give you quality time with your provider. You may need to reschedule your appointment if you arrive late (15 or more minutes).  Arriving late affects you and other patients whose appointments are after yours.  Also, if you miss three or more appointments without notifying the office, you may be dismissed from the clinic at the provider's discretion.      For prescription refill requests, have your pharmacy contact our office and allow 72 hours for refills to be completed.    Today you received the following chemotherapy and/or immunotherapy agents keytruda and magnesium    To help prevent nausea and vomiting after your treatment, we encourage you to take your nausea medication as directed.  BELOW ARE SYMPTOMS THAT SHOULD BE REPORTED IMMEDIATELY: *FEVER GREATER THAN 100.4 F (38 C) OR HIGHER *CHILLS OR SWEATING *NAUSEA AND VOMITING THAT IS NOT CONTROLLED WITH YOUR NAUSEA MEDICATION *UNUSUAL SHORTNESS OF BREATH *UNUSUAL BRUISING OR BLEEDING *URINARY PROBLEMS (pain or burning when urinating, or frequent urination) *BOWEL PROBLEMS (unusual diarrhea, constipation, pain near the anus) TENDERNESS IN MOUTH AND THROAT WITH OR WITHOUT PRESENCE OF ULCERS (sore throat, sores in mouth, or a toothache) UNUSUAL RASH, SWELLING OR PAIN  UNUSUAL VAGINAL DISCHARGE OR ITCHING   Items with * indicate a potential emergency and should be followed up as soon as possible or go to the Emergency Department if any problems should occur.  Please show the CHEMOTHERAPY ALERT CARD or IMMUNOTHERAPY ALERT CARD at check-in to  the Emergency Department and triage nurse.  Should you have questions after your visit or need to cancel or reschedule your appointment, please contact [facility] [phone]  and follow the prompts.  Office hours are 8:00 a.m. to 4:30 p.m. Monday - Friday. Please note that voicemails left after 4:00 p.m. may not be returned until the following business day.  We are closed weekends and major holidays. You have access to a nurse at all times for urgent questions. Please call the main number to the clinic [phone] and follow the prompts.  For any non-urgent questions, you may also contact your provider using MyChart. We now offer e-Visits for anyone 73 and older to request care online for non-urgent symptoms. For details visit [email or web address].   Also download the MyChart app! Go to the app store, search "MyChart", open the app, select [facility], and log in with your MyChart username and password.

## 2022-10-17 NOTE — Patient Instructions (Addendum)
[facility] at [facility] Discharge Instructions   You were seen and examined today by Dr. Delton Coombes.  He reviewed the results of your lab work which are mostly normal/stable. Your magnesium is slightly low at 1.6. Your hemoglobin has improved to 9.7 after receiving iron.   We will proceed with your treatment today with Keytruda only. We will hold the Padcev since you are still feeling weak.   Cut the prednisone back to 1/2 pill per day.   We will see you back in 3 weeks. We will see if your strength is up at that time to proceed with both medications.   Thank you for choosing [facility] at [facility] to provide your oncology and hematology care.  To afford each patient quality time with our provider, please arrive at least 15 minutes before your scheduled appointment time.   If you have a lab appointment with the [facility] please come in thru the Main Entrance and check in at the main information desk.  You need to re-schedule your appointment should you arrive 10 or more minutes late.  We strive to give you quality time with our providers, and arriving late affects you and other patients whose appointments are after yours.  Also, if you no show three or more times for appointments you may be dismissed from the clinic at the providers discretion.     Again, thank you for choosing [facility].  Our hope is that these requests will decrease the amount of time that you wait before being seen by our physicians.       _____________________________________________________________  Should you have questions after your visit to [facility], please contact our office at [phone] and follow the prompts.  Our office hours are 8:00 a.m. and 4:30 p.m. Monday - Friday.  Please note that voicemails left after 4:00 p.m. may not be returned until the following business day.  We are closed weekends and major holidays.  You do  have access to a nurse 24-7, just call the main number to the clinic [phone] and do not press any options, hold on the line and a nurse will answer the phone.    For prescription refill requests, have your pharmacy contact our office and allow 72 hours.    Due to Covid, you will need to wear a mask upon entering the hospital. If you do not have a mask, a mask will be given to you at the Main Entrance upon arrival. For doctor visits, patients may have 1 support person age 34 or older with them. For treatment visits, patients can not have anyone with them due to social distancing guidelines and our immunocompromised population.

## 2022-10-17 NOTE — Progress Notes (Signed)
Patient has been examined by Dr. Delton Coombes, and vital signs and labs have been reviewed. ANC, Creatinine, LFTs, hemoglobin, and platelets are within treatment parameters per M.D. - pt may proceed with treatment.  Keytruda only per MD. Primary RN and pharmacy notified.

## 2022-10-17 NOTE — Progress Notes (Signed)
Proceed with Beryle Flock, MD aware of prednisone at home.  Henreitta Leber, PharmD

## 2022-10-17 NOTE — Progress Notes (Signed)
Labs reviewed with MD today at office visit. Ok to treat with Keytruda only today and give Magnesium 2grams per MD.   Treatment given per orders. Patient tolerated it well without problems. Vitals stable and discharged home from clinic via wheelchair. Follow up as scheduled.

## 2022-10-17 NOTE — Progress Notes (Signed)
Patients port flushed without difficulty.  Good blood return noted with no bruising or swelling noted at site.  Patient remains accessed for chemotherapy treatment.  

## 2022-10-17 NOTE — Progress Notes (Signed)
Joshua Osborne, [place] [id number]   CLINIC:  Medical Oncology/Hematology  PCP:  Earney Mallet, MD [address] 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, a 82 y.o. male, seen for follow-up of metastatic high-grade urothelial carcinoma and toxicity assessment prior to cycle 3 of treatment.  He reports energy levels of 25%.  He is able to eat all sorts of foods without any vomiting.  He is sleeping about 4 hours/day.  He is requiring oxycodone 10 mg 4 to 5 tablets/day.  He is eating less quantities and is drinking Ensure occasionally but not consistently.  He has lost about 13 pounds since 09/21/2017.  He is currently on prednisone 10 mg daily.  REVIEW OF SYSTEMS:  Review of Systems  Gastrointestinal:  Positive for nausea.  Genitourinary:  Positive for difficulty urinating.   Musculoskeletal:  Positive for back pain.  Neurological:  Positive for numbness (Hands and feet).  Psychiatric/Behavioral:  Positive for sleep disturbance.   All other systems reviewed and are negative.   PAST MEDICAL/SURGICAL HISTORY:  Past Medical History:  Diagnosis Date   Anemia    Anxiety    Arthritis    Bladder tumor    Cancer ([place])    hx of skin cancer, renal cancer   Diabetes mellitus without complication (HCC)    GERD (gastroesophageal reflux disease)    Heart murmur    as a child   History of kidney stones    Port-A-Cath in place 08/17/2022   Sleep apnea    cpap   Past Surgical History:  Procedure Laterality Date   bilateral dupreyen contracture surgery      CHOLECYSTECTOMY     CYSTOSCOPY N/A 05/30/2022   Procedure:  CYSTOSCOPY WITH TRANSURETHRAL RESECTION OF THE LEFT URETERAL ORIFICE,  TRANSURETHRAL RESECTION OF BLADDER TUMOR;  Surgeon: Ceasar Mons, MD;  Location: [facility];  Service: Urology;  Laterality: N/A;   CYSTOSCOPY N/A 09/06/2022   Procedure: CYSTOSCOPY;  Surgeon: Cleon Gustin, MD;  Location: [facility];  Service: Urology;  Laterality: N/A;   CYSTOSCOPY WITH RETROGRADE PYELOGRAM, URETEROSCOPY AND STENT PLACEMENT Left 04/26/2022   Procedure: CYSTOSCOPY WITH RETROGRADE PYELOGRAM, URETEROSCOPY AND STENT PLACEMENT;  Surgeon: Cleon Gustin, MD;  Location: [facility];  Service: Urology;  Laterality: Left;   IR IMAGING GUIDED PORT INSERTION  08/16/2022   LIVER BIOPSY     ROBOT ASSITED LAPAROSCOPIC NEPHROURETERECTOMY Left 05/30/2022   Procedure: XI ROBOT ASSITED LAPAROSCOPIC NEPHROURETERECTOMY;  Surgeon: Ceasar Mons, MD;  Location: [facility];  Service: Urology;  Laterality: Left;   TRANSURETHRAL RESECTION OF BLADDER TUMOR  04/26/2022   Procedure: TRANSURETHRAL RESECTION OF BLADDER TUMOR (TURBT);  Surgeon: Cleon Gustin, MD;  Location: [facility];  Service: Urology;;   TRANSURETHRAL RESECTION OF BLADDER TUMOR N/A 09/06/2022   Procedure: TRANSURETHRAL RESECTION OF BLADDER TUMOR (TURBT);  Surgeon: Cleon Gustin, MD;  Location: [facility];  Service: Urology;  Laterality: N/A;    SOCIAL HISTORY:  Social History   Socioeconomic History   Marital status: Married    Spouse name: Not on file   Number of children: Not on file   Years of education: Not on file   Highest  education level: Not on file  Occupational History   Not on file  Tobacco Use   Smoking status: Former    Types: Cigarettes   Smokeless tobacco: Never  Vaping Use   Vaping Use: Never used  Substance and Sexual Activity   Alcohol use: Never   Drug use: Never   Sexual activity: Not Currently  Other Topics Concern   Not on file  Social History Narrative   Not on file   Social Determinants of Health    Financial Resource Strain: Not on file  Food Insecurity: No Food Insecurity (10/01/2022)   Hunger Vital Sign    Worried About Running Out of Food in the Last Year: Never true    Ran Out of Food in the Last Year: Never true  Transportation Needs: No Transportation Needs (10/01/2022)   PRAPARE - Hydrologist (Medical): No    Lack of Transportation (Non-Medical): No  Physical Activity: Not on file  Stress: Not on file  Social Connections: Not on file  Intimate Partner Violence: Not At Risk (10/01/2022)   Humiliation, Afraid, Rape, and Kick questionnaire    Fear of Current or Ex-Partner: No    Emotionally Abused: No    Physically Abused: No    Sexually Abused: No    FAMILY HISTORY:  History reviewed. No pertinent family history.  CURRENT MEDICATIONS:  Current Outpatient Medications  Medication Sig Dispense Refill   acetaminophen (TYLENOL) 325 MG tablet Take 2 tablets (650 mg total) by mouth every 6 (six) hours as needed for mild pain (or Fever >/= 101). 100 tablet 1   citalopram (CELEXA) 20 MG tablet Take 20 mg by mouth in the morning.     Enfortumab Vedotin-ejfv (PADCEV IV) Inject 1 Dose into the vein See admin instructions. Inject 1 dose into the vein on days 1 and 8 every 21 days     fluorouracil (EFUDEX) 5 % cream Apply 1 Application topically 2 (two) times daily as needed (cancer spots).     latanoprost (XALATAN) 0.005 % ophthalmic solution Place 1 drop into both eyes at bedtime.     lidocaine-prilocaine (EMLA) cream Apply a small amount to port a cath site and cover with plastic wrap 1 hour prior to infusion (Patient taking differently: Apply 1 Application topically daily as needed (port access).) 30 g 3   metFORMIN (GLUCOPHAGE) 500 MG tablet Take 500 mg by mouth 2 (two) times daily.     metoCLOPramide (REGLAN) 10 MG tablet Take 10 mg by mouth every 6 (six) hours as needed for nausea.     Oxycodone HCl 10 MG TABS Take 1 tablet (10 mg total) by mouth  every 6 (six) hours as needed. 120 tablet 0   Pembrolizumab (KEYTRUDA IV) Inject 1 Dose into the vein See admin instructions. Inject into the vein every 21 days     predniSONE (DELTASONE) 10 MG tablet Take 1 tablet (10 mg total) by mouth daily with breakfast. (Patient taking differently: Take 10 mg by mouth daily with breakfast. Continuous course.) 30 tablet 0   promethazine (PHENERGAN) 25 MG suppository Place 1 suppository (25 mg total) rectally every 6 (six) hours as needed for nausea or vomiting. 30 each 2   senna-docusate (SENOKOT-S) 8.6-50 MG tablet Take 2 tablets by mouth 2 (two) times daily. 120 tablet 5   silodosin (RAPAFLO) 8 MG CAPS capsule Take 1 capsule (8 mg total) by mouth daily with breakfast. 30 capsule 3   simvastatin (ZOCOR) 40 MG tablet  Take 40 mg by mouth at bedtime.     timolol (BETIMOL) 0.5 % ophthalmic solution Place 1 drop into both eyes in the morning.     tiZANidine (ZANAFLEX) 2 MG tablet Take 2 mg by mouth at bedtime.     ondansetron (ZOFRAN-ODT) 8 MG disintegrating tablet Take 1 tablet (8 mg total) by mouth every 8 (eight) hours as needed for nausea or vomiting. (Patient not taking: Reported on 10/17/2022) 60 tablet 3   prochlorperazine (COMPAZINE) 10 MG tablet Take 1 tablet (10 mg total) by mouth every 6 (six) hours as needed for nausea or vomiting. (Patient not taking: Reported on 09/28/2022) 60 tablet 3   No current facility-administered medications for this visit.    ALLERGIES:  No Known Allergies  PHYSICAL EXAM:  Performance status (ECOG): 1 - Symptomatic but completely ambulatory  There were no vitals filed for this visit.   Wt Readings from Last 3 Encounters:  10/17/22 152 lb 12.8 oz (69.3 kg)  10/01/22 163 lb (73.9 kg)  09/28/22 166 lb 4.8 oz (75.4 kg)   Physical Exam Vitals reviewed.  Constitutional:      Appearance: Normal appearance.  Cardiovascular:     Rate and Rhythm: Normal rate and regular rhythm.     Pulses: Normal pulses.     Heart  sounds: Normal heart sounds.  Pulmonary:     Effort: Pulmonary effort is normal.     Breath sounds: Normal breath sounds.  Neurological:     General: No focal deficit present.     Mental Status: He is alert and oriented to person, place, and time.  Psychiatric:        Mood and Affect: Mood normal.        Behavior: Behavior normal.     LABORATORY DATA:  I have reviewed the labs as listed.     Latest Ref Rng & Units 10/17/2022    9:26 AM 10/05/2022    3:12 AM 10/04/2022    3:24 AM  CBC  WBC 4.0 - 10.5 K/uL 15.2  11.8  13.7   Hemoglobin 13.0 - 17.0 g/dL 9.7  8.3  8.6   Hematocrit 39.0 - 52.0 % 31.4  26.5  27.8   Platelets 150 - 400 K/uL 433  294  322       Latest Ref Rng & Units 10/17/2022    9:26 AM 10/05/2022    3:12 AM 10/04/2022    3:24 AM  CMP  Glucose 70 - 99 mg/dL 124  101  81   BUN 8 - 23 mg/dL '28  18  25   '$ Creatinine 0.61 - 1.24 mg/dL 1.23  0.93  1.14   Sodium 135 - 145 mmol/L 135  136  140   Potassium 3.5 - 5.1 mmol/L 4.4  3.3  4.0   Chloride 98 - 111 mmol/L 97  99  102   CO2 22 - 32 mmol/L '29  28  27   '$ Calcium 8.9 - 10.3 mg/dL 9.3  8.4  8.7   Total Protein 6.5 - 8.1 g/dL 7.2     Total Bilirubin 0.3 - 1.2 mg/dL 0.7     Alkaline Phos 38 - 126 U/L 80     AST 15 - 41 U/L 20     ALT 0 - 44 U/L 29         Component Value Date/Time   RBC 4.91 10/17/2022 0926   MCV 64.0 (L) 10/17/2022 0926   MCH 19.8 (L) 10/17/2022 1856  MCHC 30.9 10/17/2022 0926   RDW 16.8 (H) 10/17/2022 0926   LYMPHSABS 1.4 10/17/2022 0926   MONOABS 0.8 10/17/2022 0926   EOSABS 0.2 10/17/2022 0926   BASOSABS 0.1 10/17/2022 0926    DIAGNOSTIC IMAGING:  I have independently reviewed the scans and discussed with the patient. DG Abd Portable 1V-Small Bowel Obstruction Protocol-initial, 8 hr delay  Result Date: 10/03/2022 CLINICAL DATA:  8 hour small-bowel follow-up film EXAM: PORTABLE ABDOMEN - 1 VIEW COMPARISON:  10/03/2022 FINDINGS: Administered contrast now lies throughout the small bowel  as well as within the colon. Mild small bowel prominence remains. These findings are consistent with partial small bowel obstruction. The degree of small-bowel dilatation has improved slightly in the interval from prior CT. No free air is noted. No acute bony abnormality is seen. IMPRESSION: Administered contrast lies within the small bowel and colon consistent with partial small bowel obstruction. The degree of small-bowel dilatation has improved in the interval from 10/01/2022 Electronically Signed   By: Inez Catalina M.D.   On: 10/03/2022 23:19   DG Abd Portable 1V-Small Bowel Protocol-Position Verification  Result Date: 10/03/2022 CLINICAL DATA:  Nasogastric placement EXAM: PORTABLE ABDOMEN - 1 VIEW COMPARISON:  10/01/2022 FINDINGS: Nasogastric tube tip is in the distal esophagus just above the diaphragm . IMPRESSION: Nasogastric tube tip in the distal esophagus just above the diaphragm. Electronically Signed   By: Nelson Chimes M.D.   On: 10/03/2022 13:41   DG Cervical Spine 2 or 3 views  Result Date: 10/03/2022 CLINICAL DATA:  Neck pain EXAM: CERVICAL SPINE - 2-3 VIEW COMPARISON:  None Available. FINDINGS: There is no evidence of cervical spine fracture or prevertebral soft tissue swelling. Alignment is normal. Mild degenerative disc disease at C6-7. Multilevel facet arthropathy, most advanced at the C2-3 level. Partially imaged nasogastric tube and right IJ central line. Carotid atherosclerosis. 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 No acute findings. Electronically Signed   By: Davina Poke D.O.   On: 10/03/2022 10:58   DG Abd Portable 1 View  Result Date: 10/01/2022 CLINICAL DATA:  Evaluate NG placement EXAM: PORTABLE ABDOMEN - 1 VIEW COMPARISON:  Earlier today FINDINGS: There is a enteric tube with tip and side port below the GE junction within the gastric fundus. Mildly dilated loops of small bowel are noted in the left hemiabdomen. IMPRESSION: Enteric tube tip and  side port below the GE junction within the gastric fundus. Electronically Signed   By: Kerby Moors M.D.   On: 10/01/2022 13:58   DG Abd Portable 1 View  Result Date: 10/01/2022 CLINICAL DATA:  NG tube placement. EXAM: PORTABLE ABDOMEN - 1 VIEW COMPARISON:  None Available. FINDINGS: NG tube tip is in the mid stomach with proximal side port at or just below the GE junction. Mild gaseous small bowel distension noted in the left abdomen. IMPRESSION: NG tube tip is in the mid stomach with proximal side port at or just below the GE junction. NG tube could be advanced approximately 3 cm to ensure side port is below the GE junction. Electronically Signed   By: Misty Stanley M.D.   On: 10/01/2022 13:10   CT ABDOMEN PELVIS WO CONTRAST  Result Date: 10/01/2022 CLINICAL DATA:  Abdominal pain, acute, nonlocalized. History of metastatic bladder cancer. 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 EXAM: CT ABDOMEN AND PELVIS WITHOUT CONTRAST TECHNIQUE: Multidetector CT imaging of the abdomen and pelvis was performed following the standard protocol without IV contrast. RADIATION DOSE REDUCTION: This exam was performed according  to the departmental dose-optimization program which includes automated exposure control, adjustment of the mA and/or kV according to patient size and/or use of iterative reconstruction technique. * Tracking Code: BO * COMPARISON:  September 20, 2022; August 03 2022, April 25, 2022 FINDINGS: Evaluation is limited by lack of IV contrast. Lower chest: Revisualization of multiple pulmonary nodules. Representative RIGHT lower lobe pulmonary nodule measures 8 mm, not significantly changed in comparison to most recent prior (series 4, image 16). Additional representative RIGHT middle lobe subpleural pulmonary nodule measures 11 by 7 mm, similar dating back to October 2023 given differences in technique but newly conspicuous since July (series 4, image 9). Hepatobiliary:  Multiple coarse calcifications of the liver. Status post cholecystectomy. Similar dilation of the common bile duct to approximately 10 mm, consistent with post cholecystectomy state. Pancreas: No peripancreatic fat stranding. Spleen: Unremarkable. Adrenals/Urinary Tract: Adrenal glands are unremarkable. Status post LEFT nephrectomy. No new nodularity identified within the LEFT nephrectomy bed. No RIGHT-sided hydronephrosis. No obstructing RIGHT-sided nephrolithiasis. Bladder is mildly distended. Stomach/Bowel: There are multiple mildly dilated loops of small bowel in the LEFT hemiabdomen. Representative loopd of bowel measures 3.8 cm in diameter. There is interdigitating fat stranding and trace fluid. No definitive pneumatosis. Transition point is favored to be in the RIGHT lower quadrant loop of small bowel (series 5, image 60; series 2, image 49). Diverticulosis throughout the sigmoid colon. Moderate predominately RIGHT-sided colonic stool burden. Appendix is normal. Vascular/Lymphatic: Atherosclerotic calcifications of the nonaneurysmal abdominal aorta. LEFT periaortic lymph node measuring 1.1 cm in the short axis is similar in appearance in comparison to prior (series 2, image 35). Additional ill-defined LEFT periaortic lymph node measures 10 mm in the short axis, unchanged (series 2, image 32). Reproductive: Prostatomegaly. Other: Small volume free fluid in the pelvis. Musculoskeletal: Degenerative changes of the lumbar spine. No new aggressive osseous lesions identified. Grade 1 anterolisthesis of L4-5. IMPRESSION: 1. There are multiple mildly dilated loops of small bowel in the LEFT hemiabdomen with interdigitating fat stranding and trace fluid. This is new in comparison to prior. Findings are concerning for small bowel obstruction. Transition point is favored to be in the RIGHT lower quadrant. 2. Small volume free fluid in the pelvis, nonspecific and possibly reactive. Evaluation for peritoneal nodularity is  limited by lack of IV contrast. 3. Revisualization of multiple pulmonary nodules, most consistent with metastatic disease. 4. Similar appearance of retroperitoneal adenopathy. Aortic Atherosclerosis (ICD10-I70.0). Electronically Signed   By: Valentino Saxon M.D.   On: 10/01/2022 10:39   CT Renal Stone Study  Result Date: 09/20/2022 CLINICAL DATA:  Metastatic bladder cancer. History of renal cell carcinoma left nephrectomy. Abdominal and flank pain with weakness and body aches for 2 weeks. Currently on antibiotic therapy for urinary tract infection. * Tracking Code: BO * EXAM: CT ABDOMEN AND PELVIS WITHOUT CONTRAST TECHNIQUE: Multidetector CT imaging of the abdomen and pelvis was performed following the standard protocol without IV contrast. RADIATION DOSE REDUCTION: This exam was performed according to the departmental dose-optimization program which includes automated exposure control, adjustment of the mA and/or kV according to patient size and/or use of iterative reconstruction technique. COMPARISON:  Multiple exams, including PET-CT 08/03/2022 and CT abdomen from 04/25/2022 FINDINGS: Lower chest: Bibasilar pulmonary nodules are observed. Pleural-based right middle lobe nodule measures 1.4 by 0.8 cm on image 7 series 4, previously 1.1 by 0.7 cm on 08/03/2022. A right lower lobe nodule measures 0.9 by 0.8 cm on image 6 series 4, previously 0.7 by 0.6 cm. Other nodules  appear roughly similar to previous. Descending thoracic aortic and circumflex coronary artery atherosclerotic calcification. Hepatobiliary: Stable calcified liver lesions, not previously hypermetabolic. Cholecystectomy. No biliary dilatation. Pancreas: Unremarkable Spleen: Unremarkable Adrenals/Urinary Tract: Left nephrectomy. Adrenal glands and right kidney unremarkable in noncontrast CT appearance. There is gas in the urinary bladder. Abnormal irregular wall thickening in the urinary bladder especially along the left inferior margin. This  may reflect bladder tumor. Stomach/Bowel: Sigmoid colon diverticulosis. Vascular/Lymphatic: Atherosclerosis is present, including aortoiliac atherosclerotic disease. There is substantial atheromatous calcification at the origin of the celiac trunk and SMA and tracking within the proximal SMA. Vascular patency not assessed. Previously hypermetabolic left posterior periaortic adenopathy 1.1 cm in short axis on image 36 series 2, stable. Reproductive: Marked prostatomegaly, with slightly hazy margins potentially due to local therapy or prostatitis. Other: Broad left Spigelian hernia contains small bowel, image 52 series 2, no strangulation or obstruction. Musculoskeletal: Degenerative bilateral hip arthropathy. Grade 1 anterolisthesis of L4 on L5 with left pars defect at L4. In conjunction with lumbar spondylosis and degenerative disc disease there is bilateral foraminal impingement at the L4-5 and L5-S1 levels. Small supraumbilical hernia contains adipose tissue on image 68 series 6. IMPRESSION: 1. Abnormal irregular wall thickening in the urinary bladder especially along the left inferior margin, compatible with bladder tumor. There is gas in the urinary bladder which could be from recent instrumentation or from gas-forming organism. 2. Bibasilar pulmonary nodules, some of which have mildly enlarged compared to 08/03/2022, previously hypermetabolic, compatible with metastatic disease. 3. Stable 1.1 cm left posterior periaortic lymph node, previously hypermetabolic. 4. Marked prostatomegaly, with slightly hazy margins potentially due to local therapy or prostatitis. 5. Broad left Spigelian hernia contains small bowel, no strangulation or obstruction. 6. Lower lumbar impingement. 7. Sigmoid colon diverticulosis. 8. Marked atheromatous calcification at the origin of the celiac trunk and SMA. Vascular patency not assessed on today's noncontrast exam. 9. Small supraumbilical hernia contains adipose tissue. 10.  Degenerative bilateral hip arthropathy. Grade 1 anterolisthesis of L4 on L5 with left pars defect at L4. In conjunction with lumbar spondylosis and degenerative disc disease, there is bilateral foraminal impingement at L4-5 and L5-S1. 11. Stable calcified liver lesions, not previously hypermetabolic. 12. Aortic atherosclerosis. Aortic Atherosclerosis (ICD10-I70.0). Electronically Signed   By: Van Clines M.D.   On: 09/20/2022 18:12     ASSESSMENT:  Neutrophilic leukocytosis and microcytic anemia: - Patient seen at the request of Dr. Macarthur Critchley for evaluation of leukocytosis and anemia. - CBCD (10/31/2021): WBC-15.4 (N 68%, L 16%, M 5%, E 8%, B 1%) Hb-10.3, MCV-61, PLT-452 - BCR/ABL PCR: Negative on 10/31/2021 - CBCD (09/14/2021): WBC-14.6 (elevated ANC and AEC), Hb-10.5, MCV 62, PLT-384 - CBCD (08/29/2021): WBC-13, Hb-11, MCV-65, PLT-395 (elevated ANC and AEC-0.5 - He reports that he has been anemic all his life.  No prior history of blood transfusion.  No fevers or night sweats. - 22 pound weight loss since at least summer 2022.  Reports decreased appetite.  Reports bloating after eating. - In January 2023, he was hospitalized in [place] for bowel obstruction and bladder infection.  Nonsurgical management.  He received IV iron x1. - He had colonoscopy x3, last one in the 5 years negative.    Social/family history: - He lives at home with his wife.  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  Quit smoking 38 years ago. - No family history of leukemia.  Brother had lung cancer.  Maternal  grandmother had cancer.  3.  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 - Left nephroureterectomy and lymph node biopsy on 05/30/2022 by Dr. Gilford Rile. - Pathology shows  high-grade papillary urothelial carcinoma of the renal pelvis, 7.5 cm, tumor invades through muscularis into the renal parenchyma and peripelvic fat (PT3).  Urothelial carcinoma is identified at the proximal and mid left ureter vascular, ureteral and all margins of resection are negative for tumor.  Metastatic urothelial carcinoma in 1/2 lymph nodes.  Left para-aortic resection shows 0/7 lymph nodes involved. - PET scan (08/03/2022): Hypermetabolic left supraclavicular lymph node 13 mm, SUV 3.8.  13 mm prevascular lymph node, SUV three 5.5.  High right paratracheal lymph node 12 mm, SUV 5.0.  I left paratracheal lymph node 10 mm, SUV 4.1.  Right middle lobe nodule 12 mm.  Right lower lobe nodule 7 mm.  7 mm left lung base nodule.  Right middle lobe largest nodule with SUV 2.9.  New hypermetabolic nodal tissue left aorta SUV 5.1. - Cycle 1 of enfortumab and Keytruda on 08/17/2022 - [facility] NGS: MSI-stable, TMB-low, BRAF pathogenic variant exon 15 (non V600), TSC1 pathogenic variant exon 17.  HER2 by IHC 0.  4.  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  Pathology consistent with noninvasive high-grade papillary urothelial carcinoma. - Continue follow-up with Dr. Alyson Ingles.  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 - His joint pains including shoulder pains have improved with prednisone 10 mg daily.  I have recommended that he decrease prednisone to 5 mg daily. - He is still feeling weak from recent hospitalization and weight loss. - I have reviewed his labs which showed normal LFTs.  CBC was grossly normal. - We will proceed with Keytruda today and hold enfortumab vedotin. - Recommend RTC 3 weeks for follow-up.   2.  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  3.  Microcytic anemia: - Last Feraheme on 09/28/2022.  Hemoglobin today has improved to 9.7.  4.  Chronic back pain: - Continue oxycodone 10 mg 4-5 times daily.  5.  Skin rash: - Erythematous maculopapular rash on the forearms has improved.  6.  Nausea/decreased appetite/sleeplessness: - Continue Phenergan suppositories for nausea. - He lost about 13 pounds since 09/22/2027.  He was also hospitalized and was n.p.o. between 10/01/2022 through 10/05/2022. - I have asked him to start back on Zyprexa 5 mg at bedtime.  Orders placed this encounter:  Orders Placed This Encounter  Procedures   Ambulatory Referral to [place], [place] [phone]

## 2022-10-18 ENCOUNTER — Encounter: Payer: Self-pay | Admitting: Urology

## 2022-10-18 ENCOUNTER — Ambulatory Visit (INDEPENDENT_AMBULATORY_CARE_PROVIDER_SITE_OTHER): Payer: MEDICARE | Admitting: Urology

## 2022-10-18 VITALS — BP 107/66 | HR 92 | Ht 72.0 in | Wt 152.8 lb

## 2022-10-18 DIAGNOSIS — C678 Malignant neoplasm of overlapping sites of bladder: Secondary | ICD-10-CM | POA: Diagnosis not present

## 2022-10-18 DIAGNOSIS — R339 Retention of urine, unspecified: Secondary | ICD-10-CM

## 2022-10-18 DIAGNOSIS — N1330 Unspecified hydronephrosis: Secondary | ICD-10-CM

## 2022-10-18 DIAGNOSIS — N401 Enlarged prostate with lower urinary tract symptoms: Secondary | ICD-10-CM

## 2022-10-18 DIAGNOSIS — N138 Other obstructive and reflux uropathy: Secondary | ICD-10-CM

## 2022-10-18 MED ORDER — SILODOSIN 8 MG PO CAPS: 8.0000 mg | ORAL_CAPSULE | Freq: Every day | ORAL | 3 refills | Status: DC

## 2022-10-18 NOTE — Progress Notes (Signed)
10/18/2022 10:14 AM   [place] Jun 16, 1941 [id number]  Referring provider: Earney Mallet, MD [address] [place] [address]  Followup bladder cancer and urinary retention   HPI: Mr Bair is a 82yo here for followup for urinary retention and bladder cancer. Voiding trial passed today. No dysuria or gross hematuria. He is scheduled for infusion this week. Energy has improved. No other complaints today   PMH: Past Medical History:  Diagnosis Date   Anemia    Anxiety    Arthritis    Bladder tumor    Cancer ([place])    hx of skin cancer, renal cancer   Diabetes mellitus without complication (HCC)    GERD (gastroesophageal reflux disease)    Heart murmur    as a child   History of kidney stones    Port-A-Cath in place 08/17/2022   Sleep apnea    cpap    Surgical History: Past Surgical History:  Procedure Laterality Date   bilateral dupreyen contracture surgery      CHOLECYSTECTOMY     CYSTOSCOPY N/A 05/30/2022   Procedure: CYSTOSCOPY WITH TRANSURETHRAL RESECTION OF THE LEFT URETERAL ORIFICE,  TRANSURETHRAL RESECTION OF BLADDER TUMOR;  Surgeon: Ceasar Mons, MD;  Location: [facility];  Service: Urology;  Laterality: N/A;   CYSTOSCOPY N/A 09/06/2022   Procedure: CYSTOSCOPY;  Surgeon: Cleon Gustin, MD;  Location: [facility];  Service: Urology;  Laterality: N/A;   CYSTOSCOPY WITH RETROGRADE PYELOGRAM, URETEROSCOPY AND STENT PLACEMENT Left 04/26/2022   Procedure: CYSTOSCOPY WITH RETROGRADE PYELOGRAM, URETEROSCOPY AND STENT PLACEMENT;  Surgeon: Cleon Gustin, MD;  Location: [facility];  Service: Urology;  Laterality: Left;   IR IMAGING GUIDED PORT INSERTION  08/16/2022   LIVER BIOPSY     ROBOT ASSITED LAPAROSCOPIC NEPHROURETERECTOMY Left 05/30/2022   Procedure: XI ROBOT ASSITED LAPAROSCOPIC NEPHROURETERECTOMY;  Surgeon: Ceasar Mons, MD;  Location: [facility];  Service: Urology;  Laterality: Left;   TRANSURETHRAL RESECTION OF BLADDER  TUMOR  04/26/2022   Procedure: TRANSURETHRAL RESECTION OF BLADDER TUMOR (TURBT);  Surgeon: Cleon Gustin, MD;  Location: [facility];  Service: Urology;;   TRANSURETHRAL RESECTION OF BLADDER TUMOR N/A 09/06/2022   Procedure: TRANSURETHRAL RESECTION OF BLADDER TUMOR (TURBT);  Surgeon: Cleon Gustin, MD;  Location: [facility];  Service: Urology;  Laterality: N/A;    Home Medications:  Allergies as of 10/18/2022   No Known Allergies      Medication List        Accurate as of October 18, 2022 10:14 AM. If you have any questions, ask your nurse or doctor.          acetaminophen 325 MG tablet Commonly known as: TYLENOL Take 2 tablets (650 mg total) by mouth every 6 (six) hours as needed for mild pain (or Fever >/= 101).   citalopram 20 MG tablet Commonly known as: CELEXA Take 20 mg by mouth in the morning.   fluorouracil 5 % cream Commonly known as: EFUDEX Apply 1 Application topically 2 (two) times daily as needed (cancer spots).   KEYTRUDA IV Inject 1 Dose into the vein See admin instructions. Inject into the vein every 21 days   latanoprost 0.005 % ophthalmic solution Commonly known as: XALATAN Place 1 drop into both eyes at bedtime.   lidocaine-prilocaine cream Commonly known as: EMLA Apply a small amount to port a cath site and cover with plastic wrap 1 hour prior to infusion What changed:  how much to take how to take this  when to take this reasons to take this additional instructions   metFORMIN 500 MG tablet Commonly known as: GLUCOPHAGE Take 500 mg by mouth 2 (two) times daily.   metoCLOPramide 10 MG tablet Commonly known as: REGLAN Take 10 mg by mouth every 6 (six) hours as needed for nausea.   ondansetron 8 MG disintegrating tablet Commonly known as: ZOFRAN-ODT Take 1 tablet (8 mg total) by mouth every 8 (eight) hours as needed for nausea or vomiting.   Oxycodone HCl 10 MG Tabs Take 1 tablet (10 mg total) by mouth every 6 (six) hours as needed.    PADCEV IV Inject 1 Dose into the vein See admin instructions. Inject 1 dose into the vein on days 1 and 8 every 21 days   predniSONE 10 MG tablet Commonly known as: DELTASONE Take 1 tablet (10 mg total) by mouth daily with breakfast. What changed: additional instructions   prochlorperazine 10 MG tablet Commonly known as: COMPAZINE Take 1 tablet (10 mg total) by mouth every 6 (six) hours as needed for nausea or vomiting.   promethazine 25 MG suppository Commonly known as: PHENERGAN Place 1 suppository (25 mg total) rectally every 6 (six) hours as needed for nausea or vomiting.   senna-docusate 8.6-50 MG tablet Commonly known as: Senokot-S Take 2 tablets by mouth 2 (two) times daily.   silodosin 8 MG Caps capsule Commonly known as: RAPAFLO Take 1 capsule (8 mg total) by mouth daily with breakfast.   simvastatin 40 MG tablet Commonly known as: ZOCOR Take 40 mg by mouth at bedtime.   timolol 0.5 % ophthalmic solution Commonly known as: BETIMOL Place 1 drop into both eyes in the morning.   tiZANidine 2 MG tablet Commonly known as: ZANAFLEX Take 2 mg by mouth at bedtime.        Allergies: No Known Allergies  Family History: No family history on file.  Social History:  reports that he has quit smoking. His smoking use included cigarettes. He has never used smokeless tobacco. He reports that he does not drink alcohol and does not use drugs.  ROS: All other review of systems were reviewed and are negative except what is noted above in HPI  Physical Exam: BP 107/66   Pulse 92   Ht 6' (1.829 m)   Wt 152 lb 12.8 oz (69.3 kg)   BMI 20.72 kg/m   Constitutional:  Alert and oriented, No acute distress. HEENT: [place] AT, moist mucus membranes.  Trachea midline, no masses. Cardiovascular: No clubbing, cyanosis, or edema. Respiratory: Normal respiratory effort, no increased work of breathing. GI: Abdomen is soft, nontender, nondistended, no abdominal masses GU: No CVA  tenderness.  Lymph: No cervical or inguinal lymphadenopathy. Skin: No rashes, bruises or suspicious lesions. Neurologic: Grossly intact, no focal deficits, moving all 4 extremities. Psychiatric: Normal mood and affect.  Laboratory Data: Lab Results  Component Value Date   WBC 15.2 (H) 10/17/2022   HGB 9.7 (L) 10/17/2022   HCT 31.4 (L) 10/17/2022   MCV 64.0 (L) 10/17/2022   PLT 433 (H) 10/17/2022    Lab Results  Component Value Date   CREATININE 1.23 10/17/2022    No results found for: "PSA"  No results found for: "TESTOSTERONE"  Lab Results  Component Value Date   HGBA1C 5.3 08/24/2022    Urinalysis    Component Value Date/Time   COLORURINE YELLOW 10/01/2022 1044   APPEARANCEUR HAZY (A) 10/01/2022 1044   APPEARANCEUR Cloudy (A) 09/25/2022 0943   LABSPEC 1.015 10/01/2022 1044  PHURINE 6.0 10/01/2022 1044   GLUCOSEU NEGATIVE 10/01/2022 1044   HGBUR NEGATIVE 10/01/2022 1044   BILIRUBINUR NEGATIVE 10/01/2022 1044   BILIRUBINUR Negative 09/25/2022 [place] 10/01/2022 1044   PROTEINUR 30 (A) 10/01/2022 1044   NITRITE NEGATIVE 10/01/2022 1044   LEUKOCYTESUR LARGE (A) 10/01/2022 1044    Lab Results  Component Value Date   LABMICR See below: 09/25/2022   WBCUA >30 (A) 09/25/2022   LABEPIT 0-10 09/25/2022   MUCUS Present 05/03/2022   BACTERIA NONE SEEN 10/01/2022    Pertinent Imaging: CT 10/01/2022: Images reviewed and discussed with the patient  No results found for this or any previous visit.  No results found for this or any previous visit.  No results found for this or any previous visit.  No results found for this or any previous visit.  No results found for this or any previous visit.  No valid procedures specified. No results found for this or any previous visit.  Results for orders placed during the hospital encounter of 09/20/22  CT Renal Stone Study  Narrative CLINICAL DATA:  Metastatic bladder cancer. History of renal  cell carcinoma left nephrectomy. Abdominal and flank pain with weakness and body aches for 2 weeks. Currently on antibiotic therapy for urinary tract infection.  * Tracking Code: BO *  EXAM: CT ABDOMEN AND PELVIS WITHOUT CONTRAST  TECHNIQUE: Multidetector CT imaging of the abdomen and pelvis was performed following the standard protocol without IV contrast.  RADIATION DOSE REDUCTION: This exam was performed according to the departmental dose-optimization program which includes automated exposure control, adjustment of the mA and/or kV according to patient size and/or use of iterative reconstruction technique.  COMPARISON:  Multiple exams, including PET-CT 08/03/2022 and CT abdomen from 04/25/2022  FINDINGS: Lower chest: Bibasilar pulmonary nodules are observed. Pleural-based right middle lobe nodule measures 1.4 by 0.8 cm on image 7 series 4, previously 1.1 by 0.7 cm on 08/03/2022. A right lower lobe nodule measures 0.9 by 0.8 cm on image 6 series 4, previously 0.7 by 0.6 cm. Other nodules appear roughly similar to previous.  Descending thoracic aortic and circumflex coronary artery atherosclerotic calcification.  Hepatobiliary: Stable calcified liver lesions, not previously hypermetabolic. Cholecystectomy. No biliary dilatation.  Pancreas: Unremarkable  Spleen: Unremarkable  Adrenals/Urinary Tract: Left nephrectomy. Adrenal glands and right kidney unremarkable in noncontrast CT appearance. There is gas in the urinary bladder. Abnormal irregular wall thickening in the urinary bladder especially along the left inferior margin. This may reflect bladder tumor.  Stomach/Bowel: Sigmoid colon diverticulosis.  Vascular/Lymphatic: Atherosclerosis is present, including aortoiliac atherosclerotic disease. There is substantial atheromatous calcification at the origin of the celiac trunk and SMA and tracking within the proximal SMA. Vascular patency not assessed.  Previously  hypermetabolic left posterior periaortic adenopathy 1.1 cm in short axis on image 36 series 2, stable.  Reproductive: Marked prostatomegaly, with slightly hazy margins potentially due to local therapy or prostatitis.  Other: Broad left Spigelian hernia contains small bowel, image 52 series 2, no strangulation or obstruction.  Musculoskeletal: Degenerative bilateral hip arthropathy. Grade 1 anterolisthesis of L4 on L5 with left pars defect at L4. In conjunction with lumbar spondylosis and degenerative disc disease there is bilateral foraminal impingement at the L4-5 and L5-S1 levels.  Small supraumbilical hernia contains adipose tissue on image 68 series 6.  IMPRESSION: 1. Abnormal irregular wall thickening in the urinary bladder especially along the left inferior margin, compatible with bladder tumor. There is gas in the urinary bladder which could  be from recent instrumentation or from gas-forming organism. 2. Bibasilar pulmonary nodules, some of which have mildly enlarged compared to 08/03/2022, previously hypermetabolic, compatible with metastatic disease. 3. Stable 1.1 cm left posterior periaortic lymph node, previously hypermetabolic. 4. Marked prostatomegaly, with slightly hazy margins potentially due to local therapy or prostatitis. 5. Broad left Spigelian hernia contains small bowel, no strangulation or obstruction. 6. Lower lumbar impingement. 7. Sigmoid colon diverticulosis. 8. Marked atheromatous calcification at the origin of the celiac trunk and SMA. Vascular patency not assessed on today's noncontrast exam. 9. Small supraumbilical hernia contains adipose tissue. 10. Degenerative bilateral hip arthropathy. Grade 1 anterolisthesis of L4 on L5 with left pars defect at L4. In conjunction with lumbar spondylosis and degenerative disc disease, there is bilateral foraminal impingement at L4-5 and L5-S1. 11. Stable calcified liver lesions, not previously  hypermetabolic. 12. Aortic atherosclerosis.  Aortic Atherosclerosis (ICD10-I70.0).   Electronically Signed By: Van Clines M.D. On: 09/20/2022 18:12   Assessment & Plan:    1.  Malignant neoplasm of overlapping sites of bladder ([place]) -followup 2 months for cystoscopy  2. Benign prostatic hyperplasia with urinary obstruction -continue rapaflo '8mg'$   3. Urinary retention -continue rapaflo '8mg'$     No follow-ups on file.  [name], MD  [facility] [place]

## 2022-10-18 NOTE — Progress Notes (Signed)
Fill and Pull Catheter Removal  Patient is present today for a catheter removal.  Patient was cleaned and prepped in a sterile fashion 28m of sterile water/ saline was instilled into the bladder when the patient felt the urge to urinate. 168mof water was then drained from the balloon.  A 14FR foley cath was removed from the bladder no complications were noted .  Patient as then given some time to void on their own.  Patient can void  10019mn their own after some time.  Patient tolerated well.  Performed by: KouLevi AlandMA  Follow up/ Additional notes: MD to see after

## 2022-10-18 NOTE — Patient Instructions (Signed)
Acute Urinary Retention, Male  Acute urinary retention is when a person cannot pee (urinate) at all, or can only pee a little. This can come on all of a sudden. If it is not treated, it can lead to kidney problems or other serious problems. What are the causes? A problem with the tube that drains the bladder (urethra). Problems with the nerves in the bladder. Tumors. Certain medicines. An infection. Having trouble pooping (constipation). What increases the risk? Older men are more at risk because their prostate gland may become larger as they age. Other conditions also can increase risk. These include: Diseases, such as multiple sclerosis. Injury to the spinal cord. Diabetes. A condition that affects the way the brain works, such as dementia. Holding back urine due to trauma or because you do not want to use the bathroom. What are the signs or symptoms? Trouble peeing. Pain in the lower belly. How is this treated? Treatment for this condition may include: Medicines. Placing a thin, germ-free tube (catheter) into the bladder to drain pee out of the body. Therapy to treat mental health conditions. Treatment for conditions that may cause this. If needed, you may be treated in the hospital for kidney problems or to manage other problems. Follow these instructions at home: Medicines Take over-the-counter and prescription medicines only as told by your doctor. Ask your doctor what medicines you should stay away from. If you were given an antibiotic medicine, take it as told by your doctor. Do not stop taking it, even if you start to feel better. General instructions Do not smoke or use any products that contain nicotine or tobacco. If you need help quitting, ask your doctor. Drink enough fluid to keep your pee pale yellow. If you were sent home with a tube that drains the bladder, take care of it as told by your doctor. Watch for changes in your symptoms. Tell your doctor about them. If  told, keep track of changes in your blood pressure at home. Tell your doctor about them. Keep all follow-up visits. Contact a doctor if: You have spasms in your bladder that you cannot stop. You leak pee when you have spasms. Get help right away if: You have chills or a fever. You have blood in your pee. You have a tube that drains pee from the bladder and these things happen: The tube stops draining pee. The tube falls out. Summary Acute urinary retention is when you cannot pee at all or you pee too little. If this condition is not treated, it can lead to kidney problems or other serious problems. If you were sent home with a tube (catheter) that drains the bladder, take care of it as told by your doctor. Watch for changes in your symptoms. Tell your doctor about them. This information is not intended to replace advice given to you by your health care provider. Make sure you discuss any questions you have with your health care provider. Document Revised: 06/16/2020 Document Reviewed: 06/16/2020 Elsevier Patient Education  2023 [employer]  

## 2022-10-23 ENCOUNTER — Ambulatory Visit: Payer: MEDICARE

## 2022-10-23 ENCOUNTER — Other Ambulatory Visit: Payer: MEDICARE

## 2022-10-23 ENCOUNTER — Inpatient Hospital Stay: Payer: MEDICARE | Admitting: Dietician

## 2022-10-23 ENCOUNTER — Ambulatory Visit: Payer: MEDICARE | Admitting: Hematology

## 2022-10-23 NOTE — Progress Notes (Signed)
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 S/p left nephrectomy 05/30/22. He is currently receiving enfortumab vedotin + keytruda q21d.   Past medical history includes SBO, CKD stage 3, HLD, DM2  Spoke with wife of patient via telephone. She reports pt is currently not at home. Wife agreeable to provide nutrition history. She reports his appetite has really picked up the last week. Patient is eating 3 meals plus snacking. He is drinking Ensure once weekly if needed. Wife says pt has increased energy and appears to have gained a little weight. She denies nutrition impact symptoms.   Nutrition Focused Physical Exam: unable to complete (telephone visit)   Medications: celexa, metformin, zofran, oxycodone, prednisone, phenergan, senna-s, zocor, zanaflex   Labs: 1/9 - glucose 124, BUN 28   Anthropometrics:   Height: 6' Weight: 152 lb 12.8 oz  UBW: 176 lb 6.4 oz (08/24/22) BMI: 20.72   NUTRITION DIAGNOSIS: Unintentional weight loss related to cancer as evidenced by  13% (24 lb) decrease from usual weight in 2 months - severe   MALNUTRITION DIAGNOSIS: Highly suspect pt meets criteria for degree of malnutrition given advanced age with significant wt loss in the last 2 months, however unable to identify without NFPE   INTERVENTION:  Continue strategies for increasing calories and protein with small frequent meals and snacks - will mail handout with ideas Encouraged high calorie foods to promote wt gain Encouraged Ensure Plus/equivalent once daily Contact information provided    MONITORING, EVALUATION, GOAL: Pt will tolerate increased calories and protein to promote wt gain   Next Visit: No follow-up at this time. Wife agreeable to contact with nutrition questions/concerns

## 2022-10-25 ENCOUNTER — Other Ambulatory Visit: Payer: Self-pay | Admitting: Hematology

## 2022-10-26 ENCOUNTER — Other Ambulatory Visit: Payer: MEDICARE

## 2022-10-26 ENCOUNTER — Ambulatory Visit: Payer: MEDICARE

## 2022-10-26 ENCOUNTER — Encounter (HOSPITAL_COMMUNITY): Payer: Self-pay | Admitting: *Deleted

## 2022-10-26 ENCOUNTER — Emergency Department (HOSPITAL_COMMUNITY): Payer: MEDICARE

## 2022-10-26 ENCOUNTER — Telehealth: Payer: Self-pay | Admitting: *Deleted

## 2022-10-26 ENCOUNTER — Inpatient Hospital Stay (HOSPITAL_COMMUNITY): Payer: MEDICARE

## 2022-10-26 ENCOUNTER — Ambulatory Visit: Payer: MEDICARE | Admitting: Hematology

## 2022-10-26 ENCOUNTER — Other Ambulatory Visit: Payer: Self-pay

## 2022-10-26 ENCOUNTER — Inpatient Hospital Stay (HOSPITAL_COMMUNITY)
Admission: EM | Admit: 2022-10-26 | Discharge: 2022-10-31 | DRG: 872 | Disposition: A | Discharge status: Hospice | Payer: MEDICARE | Attending: Internal Medicine | Admitting: Internal Medicine

## 2022-10-26 DIAGNOSIS — R5381 Other malaise: Secondary | ICD-10-CM | POA: Diagnosis present

## 2022-10-26 DIAGNOSIS — N3001 Acute cystitis with hematuria: Secondary | ICD-10-CM | POA: Diagnosis present

## 2022-10-26 DIAGNOSIS — E119 Type 2 diabetes mellitus without complications: Secondary | ICD-10-CM

## 2022-10-26 DIAGNOSIS — N1832 Chronic kidney disease, stage 3b: Secondary | ICD-10-CM | POA: Diagnosis present

## 2022-10-26 DIAGNOSIS — C652 Malignant neoplasm of left renal pelvis: Secondary | ICD-10-CM | POA: Diagnosis present

## 2022-10-26 DIAGNOSIS — D509 Iron deficiency anemia, unspecified: Secondary | ICD-10-CM | POA: Diagnosis present

## 2022-10-26 DIAGNOSIS — C679 Malignant neoplasm of bladder, unspecified: Secondary | ICD-10-CM | POA: Diagnosis present

## 2022-10-26 DIAGNOSIS — Z905 Acquired absence of kidney: Secondary | ICD-10-CM

## 2022-10-26 DIAGNOSIS — Z9049 Acquired absence of other specified parts of digestive tract: Secondary | ICD-10-CM

## 2022-10-26 DIAGNOSIS — C771 Secondary and unspecified malignant neoplasm of intrathoracic lymph nodes: Secondary | ICD-10-CM | POA: Diagnosis present

## 2022-10-26 DIAGNOSIS — I959 Hypotension, unspecified: Secondary | ICD-10-CM | POA: Diagnosis present

## 2022-10-26 DIAGNOSIS — C642 Malignant neoplasm of left kidney, except renal pelvis: Secondary | ICD-10-CM | POA: Diagnosis not present

## 2022-10-26 DIAGNOSIS — E861 Hypovolemia: Secondary | ICD-10-CM | POA: Diagnosis present

## 2022-10-26 DIAGNOSIS — Z7401 Bed confinement status: Secondary | ICD-10-CM

## 2022-10-26 DIAGNOSIS — I700 Atherosclerosis of aorta: Secondary | ICD-10-CM | POA: Diagnosis present

## 2022-10-26 DIAGNOSIS — E1122 Type 2 diabetes mellitus with diabetic chronic kidney disease: Secondary | ICD-10-CM | POA: Diagnosis present

## 2022-10-26 DIAGNOSIS — E872 Acidosis, unspecified: Secondary | ICD-10-CM | POA: Diagnosis present

## 2022-10-26 DIAGNOSIS — Z87442 Personal history of urinary calculi: Secondary | ICD-10-CM

## 2022-10-26 DIAGNOSIS — E876 Hypokalemia: Secondary | ICD-10-CM | POA: Diagnosis present

## 2022-10-26 DIAGNOSIS — Z79899 Other long term (current) drug therapy: Secondary | ICD-10-CM

## 2022-10-26 DIAGNOSIS — Z85828 Personal history of other malignant neoplasm of skin: Secondary | ICD-10-CM

## 2022-10-26 DIAGNOSIS — N401 Enlarged prostate with lower urinary tract symptoms: Secondary | ICD-10-CM | POA: Diagnosis present

## 2022-10-26 DIAGNOSIS — B961 Klebsiella pneumoniae [K. pneumoniae] as the cause of diseases classified elsewhere: Secondary | ICD-10-CM | POA: Diagnosis present

## 2022-10-26 DIAGNOSIS — E782 Mixed hyperlipidemia: Secondary | ICD-10-CM | POA: Diagnosis present

## 2022-10-26 DIAGNOSIS — F419 Anxiety disorder, unspecified: Secondary | ICD-10-CM | POA: Diagnosis present

## 2022-10-26 DIAGNOSIS — N179 Acute kidney failure, unspecified: Secondary | ICD-10-CM | POA: Diagnosis present

## 2022-10-26 DIAGNOSIS — K56609 Unspecified intestinal obstruction, unspecified as to partial versus complete obstruction: Secondary | ICD-10-CM | POA: Diagnosis not present

## 2022-10-26 DIAGNOSIS — G8929 Other chronic pain: Secondary | ICD-10-CM | POA: Diagnosis present

## 2022-10-26 DIAGNOSIS — E860 Dehydration: Secondary | ICD-10-CM | POA: Diagnosis present

## 2022-10-26 DIAGNOSIS — C786 Secondary malignant neoplasm of retroperitoneum and peritoneum: Secondary | ICD-10-CM | POA: Diagnosis present

## 2022-10-26 DIAGNOSIS — K5669 Other partial intestinal obstruction: Secondary | ICD-10-CM | POA: Diagnosis present

## 2022-10-26 DIAGNOSIS — Z515 Encounter for palliative care: Secondary | ICD-10-CM | POA: Diagnosis not present

## 2022-10-26 DIAGNOSIS — R918 Other nonspecific abnormal finding of lung field: Secondary | ICD-10-CM | POA: Diagnosis present

## 2022-10-26 DIAGNOSIS — Z87891 Personal history of nicotine dependence: Secondary | ICD-10-CM | POA: Diagnosis not present

## 2022-10-26 DIAGNOSIS — E871 Hypo-osmolality and hyponatremia: Secondary | ICD-10-CM | POA: Diagnosis present

## 2022-10-26 DIAGNOSIS — M545 Low back pain, unspecified: Secondary | ICD-10-CM | POA: Diagnosis present

## 2022-10-26 DIAGNOSIS — R338 Other retention of urine: Secondary | ICD-10-CM | POA: Diagnosis present

## 2022-10-26 DIAGNOSIS — R6520 Severe sepsis without septic shock: Secondary | ICD-10-CM | POA: Diagnosis present

## 2022-10-26 DIAGNOSIS — K219 Gastro-esophageal reflux disease without esophagitis: Secondary | ICD-10-CM | POA: Diagnosis present

## 2022-10-26 DIAGNOSIS — A419 Sepsis, unspecified organism: Principal | ICD-10-CM | POA: Diagnosis present

## 2022-10-26 DIAGNOSIS — Z8553 Personal history of malignant neoplasm of renal pelvis: Secondary | ICD-10-CM

## 2022-10-26 DIAGNOSIS — Z7952 Long term (current) use of systemic steroids: Secondary | ICD-10-CM

## 2022-10-26 DIAGNOSIS — D849 Immunodeficiency, unspecified: Secondary | ICD-10-CM | POA: Diagnosis present

## 2022-10-26 DIAGNOSIS — G4733 Obstructive sleep apnea (adult) (pediatric): Secondary | ICD-10-CM | POA: Diagnosis present

## 2022-10-26 LAB — COMPREHENSIVE METABOLIC PANEL
ALT: 19 U/L (ref 0–44)
AST: 21 U/L (ref 15–41)
Albumin: 4 g/dL (ref 3.5–5.0)
Alkaline Phosphatase: 78 U/L (ref 38–126)
Anion gap: 14 (ref 5–15)
BUN: 21 mg/dL (ref 8–23)
CO2: 26 mmol/L (ref 22–32)
Calcium: 9.7 mg/dL (ref 8.9–10.3)
Chloride: 97 mmol/L — ABNORMAL LOW (ref 98–111)
Creatinine, Ser: 1.38 mg/dL — ABNORMAL HIGH (ref 0.61–1.24)
GFR, Estimated: 52 mL/min — ABNORMAL LOW (ref >60)
Glucose, Bld: 199 mg/dL — ABNORMAL HIGH (ref 70–99)
Potassium: 4.9 mmol/L (ref 3.5–5.1)
Sodium: 137 mmol/L (ref 135–145)
Total Bilirubin: 0.9 mg/dL (ref 0.3–1.2)
Total Protein: 7.8 g/dL (ref 6.5–8.1)

## 2022-10-26 LAB — URINALYSIS, ROUTINE W REFLEX MICROSCOPIC
Bacteria, UA: NONE SEEN
Bilirubin Urine: NEGATIVE
Glucose, UA: NEGATIVE mg/dL
Ketones, ur: NEGATIVE mg/dL
Nitrite: NEGATIVE
Protein, ur: 30 mg/dL — AB
RBC / HPF: >50 RBC/hpf — ABNORMAL HIGH (ref 0–5)
Specific Gravity, Urine: 1.027 (ref 1.005–1.030)
WBC, UA: >50 WBC/hpf — ABNORMAL HIGH (ref 0–5)
pH: 5 (ref 5.0–8.0)

## 2022-10-26 LAB — GLUCOSE, CAPILLARY
Glucose-Capillary: 111 mg/dL — ABNORMAL HIGH (ref 70–99)
Glucose-Capillary: 125 mg/dL — ABNORMAL HIGH (ref 70–99)
Glucose-Capillary: 147 mg/dL — ABNORMAL HIGH (ref 70–99)

## 2022-10-26 LAB — CBC WITH DIFFERENTIAL/PLATELET
Abs Immature Granulocytes: 0.3 10*3/uL — ABNORMAL HIGH (ref 0.00–0.07)
Basophils Absolute: 0.1 10*3/uL (ref 0.0–0.1)
Basophils Relative: 0 %
Eosinophils Absolute: 0.3 10*3/uL (ref 0.0–0.5)
Eosinophils Relative: 1 %
HCT: 36.1 % — ABNORMAL LOW (ref 39.0–52.0)
Hemoglobin: 11.4 g/dL — ABNORMAL LOW (ref 13.0–17.0)
Immature Granulocytes: 1 %
Lymphocytes Relative: 5 %
Lymphs Abs: 1.3 10*3/uL (ref 0.7–4.0)
MCH: 20.1 pg — ABNORMAL LOW (ref 26.0–34.0)
MCHC: 31.6 g/dL (ref 30.0–36.0)
MCV: 63.7 fL — ABNORMAL LOW (ref 80.0–100.0)
Monocytes Absolute: 1.1 10*3/uL — ABNORMAL HIGH (ref 0.1–1.0)
Monocytes Relative: 5 %
Neutro Abs: 21.7 10*3/uL — ABNORMAL HIGH (ref 1.7–7.7)
Neutrophils Relative %: 88 %
Platelets: 402 10*3/uL — ABNORMAL HIGH (ref 150–400)
RBC: 5.67 MIL/uL (ref 4.22–5.81)
RDW: 17.9 % — ABNORMAL HIGH (ref 11.5–15.5)
WBC: 24.7 10*3/uL — ABNORMAL HIGH (ref 4.0–10.5)
nRBC: 0 % (ref 0.0–0.2)

## 2022-10-26 LAB — LIPASE, BLOOD: Lipase: 31 U/L (ref 11–51)

## 2022-10-26 LAB — LACTIC ACID, PLASMA
Lactic Acid, Venous: 3.2 mmol/L (ref 0.5–1.9)
Lactic Acid, Venous: 3 mmol/L (ref 0.5–1.9)

## 2022-10-26 LAB — APTT: aPTT: 28 seconds (ref 24–36)

## 2022-10-26 LAB — MAGNESIUM: Magnesium: 1.6 mg/dL — ABNORMAL LOW (ref 1.7–2.4)

## 2022-10-26 LAB — PROTIME-INR
INR: 1 (ref 0.8–1.2)
Prothrombin Time: 13.4 seconds (ref 11.4–15.2)

## 2022-10-26 MED ORDER — LACTATED RINGERS IV BOLUS
1000.0000 mL | Freq: Once | INTRAVENOUS | Status: AC
  Administered 2022-10-26: 1000 mL via INTRAVENOUS

## 2022-10-26 MED ORDER — PREDNISONE 10 MG PO TABS
5.0000 mg | ORAL_TABLET | Freq: Every day | ORAL | Status: DC
  Administered 2022-10-27 – 2022-10-31 (×5): 5 mg via ORAL
  Filled 2022-10-26 (×5): qty 1

## 2022-10-26 MED ORDER — HYDROMORPHONE HCL 1 MG/ML IJ SOLN
0.5000 mg | Freq: Once | INTRAMUSCULAR | Status: AC
  Administered 2022-10-26: 0.5 mg via INTRAVENOUS
  Filled 2022-10-26: qty 0.5

## 2022-10-26 MED ORDER — SODIUM CHLORIDE 0.9 % IV SOLN
1.0000 g | Freq: Two times a day (BID) | INTRAVENOUS | Status: DC
  Administered 2022-10-26 – 2022-10-27 (×2): 1 g via INTRAVENOUS
  Filled 2022-10-26 (×2): qty 20

## 2022-10-26 MED ORDER — LATANOPROST 0.005 % OP SOLN
1.0000 [drp] | Freq: Every day | OPHTHALMIC | Status: DC
  Administered 2022-10-26 – 2022-10-30 (×5): 1 [drp] via OPHTHALMIC
  Filled 2022-10-26: qty 2.5

## 2022-10-26 MED ORDER — TIMOLOL MALEATE 0.5 % OP SOLN
1.0000 [drp] | Freq: Every day | OPHTHALMIC | Status: DC
  Administered 2022-10-27 – 2022-10-31 (×5): 1 [drp] via OPHTHALMIC
  Filled 2022-10-26: qty 5

## 2022-10-26 MED ORDER — ONDANSETRON HCL 4 MG/2ML IJ SOLN
4.0000 mg | Freq: Once | INTRAMUSCULAR | Status: AC
  Administered 2022-10-26: 4 mg via INTRAVENOUS
  Filled 2022-10-26: qty 2

## 2022-10-26 MED ORDER — SIMVASTATIN 20 MG PO TABS
40.0000 mg | ORAL_TABLET | Freq: Every day | ORAL | Status: DC
  Administered 2022-10-26 – 2022-10-30 (×5): 40 mg via ORAL
  Filled 2022-10-26 (×5): qty 2

## 2022-10-26 MED ORDER — HYDROMORPHONE HCL 1 MG/ML IJ SOLN
0.5000 mg | INTRAMUSCULAR | Status: DC | PRN
  Administered 2022-10-26 – 2022-10-31 (×19): 1 mg via INTRAVENOUS
  Administered 2022-10-31: 0.5 mg via INTRAVENOUS
  Administered 2022-10-31 (×2): 1 mg via INTRAVENOUS
  Filled 2022-10-26 (×15): qty 1
  Filled 2022-10-26: qty 0.5
  Filled 2022-10-26 (×6): qty 1

## 2022-10-26 MED ORDER — ACETAMINOPHEN 325 MG PO TABS: 650.0000 mg | ORAL_TABLET | Freq: Four times a day (QID) | ORAL | Status: DC | PRN

## 2022-10-26 MED ORDER — SODIUM CHLORIDE 0.9 % IV BOLUS
500.0000 mL | Freq: Once | INTRAVENOUS | Status: AC
  Administered 2022-10-26: 500 mL via INTRAVENOUS

## 2022-10-26 MED ORDER — ACETAMINOPHEN 10 MG/ML IV SOLN
1000.0000 mg | Freq: Four times a day (QID) | INTRAVENOUS | Status: AC
  Administered 2022-10-26 – 2022-10-27 (×4): 1000 mg via INTRAVENOUS
  Filled 2022-10-26 (×4): qty 100

## 2022-10-26 MED ORDER — METHOCARBAMOL 1000 MG/10ML IJ SOLN
INTRAMUSCULAR | Status: AC
  Filled 2022-10-26: qty 10

## 2022-10-26 MED ORDER — METHOCARBAMOL 1000 MG/10ML IJ SOLN
500.0000 mg | Freq: Three times a day (TID) | INTRAVENOUS | Status: DC
  Administered 2022-10-26 – 2022-10-31 (×15): 500 mg via INTRAVENOUS
  Filled 2022-10-26 (×7): qty 5
  Filled 2022-10-26: qty 500
  Filled 2022-10-26: qty 5
  Filled 2022-10-26: qty 500
  Filled 2022-10-26: qty 5

## 2022-10-26 MED ORDER — SODIUM CHLORIDE 0.9 % IV SOLN
1.0000 g | Freq: Once | INTRAVENOUS | Status: AC
  Administered 2022-10-26: 1 g via INTRAVENOUS
  Filled 2022-10-26: qty 10

## 2022-10-26 MED ORDER — CHLORHEXIDINE GLUCONATE CLOTH 2 % EX PADS
6.0000 | MEDICATED_PAD | Freq: Every day | CUTANEOUS | Status: DC
  Administered 2022-10-27 – 2022-10-31 (×5): 6 via TOPICAL

## 2022-10-26 MED ORDER — ONDANSETRON HCL 4 MG/2ML IJ SOLN
4.0000 mg | Freq: Four times a day (QID) | INTRAMUSCULAR | Status: DC | PRN
  Administered 2022-10-27: 4 mg via INTRAVENOUS
  Filled 2022-10-26: qty 2

## 2022-10-26 MED ORDER — ENOXAPARIN SODIUM 40 MG/0.4ML IJ SOSY
40.0000 mg | PREFILLED_SYRINGE | INTRAMUSCULAR | Status: DC
  Administered 2022-10-26 – 2022-10-30 (×5): 40 mg via SUBCUTANEOUS
  Filled 2022-10-26 (×5): qty 0.4

## 2022-10-26 MED ORDER — CITALOPRAM HYDROBROMIDE 20 MG PO TABS
20.0000 mg | ORAL_TABLET | Freq: Every day | ORAL | Status: DC
  Administered 2022-10-27 – 2022-10-31 (×5): 20 mg via ORAL
  Filled 2022-10-26 (×5): qty 1

## 2022-10-26 MED ORDER — IOHEXOL 300 MG/ML  SOLN
100.0000 mL | Freq: Once | INTRAMUSCULAR | Status: AC | PRN
  Administered 2022-10-26: 100 mL via INTRAVENOUS

## 2022-10-26 MED ORDER — TAMSULOSIN HCL 0.4 MG PO CAPS
0.4000 mg | ORAL_CAPSULE | Freq: Every day | ORAL | Status: DC
  Administered 2022-10-27 – 2022-10-31 (×5): 0.4 mg via ORAL
  Filled 2022-10-26 (×5): qty 1

## 2022-10-26 MED ORDER — FLUOROURACIL 5 % EX CREA: 1.0000 | TOPICAL_CREAM | Freq: Two times a day (BID) | CUTANEOUS | Status: DC | PRN

## 2022-10-26 MED ORDER — MAGNESIUM SULFATE 2 GM/50ML IV SOLN
2.0000 g | Freq: Once | INTRAVENOUS | Status: AC
  Administered 2022-10-26: 2 g via INTRAVENOUS
  Filled 2022-10-26: qty 50

## 2022-10-26 MED ORDER — INSULIN ASPART 100 UNIT/ML IJ SOLN
0.0000 [IU] | INTRAMUSCULAR | Status: DC
  Administered 2022-10-26 – 2022-10-27 (×3): 1 [IU] via SUBCUTANEOUS

## 2022-10-26 MED ORDER — ONDANSETRON HCL 4 MG PO TABS: 4.0000 mg | ORAL_TABLET | Freq: Four times a day (QID) | ORAL | Status: DC | PRN

## 2022-10-26 MED ORDER — ACETAMINOPHEN 650 MG RE SUPP: 650.0000 mg | Freq: Four times a day (QID) | RECTAL | Status: DC | PRN

## 2022-10-26 MED ORDER — LACTATED RINGERS IV SOLN: INTRAVENOUS | Status: DC

## 2022-10-26 NOTE — Progress Notes (Signed)
Pharmacy Antibiotic Note  Joshua Osborne is a 82 y.o. male admitted on 10/26/2022 with UTI.  Pharmacy has been consulted for meropenem dosing.  Plan: Meropenem 1000 mg IV every 12 hours. Monitor labs, c/s, and patient improvement.  Height: 6' (182.9 cm) Weight: 68.9 kg (152 lb) IBW/kg (Calculated) : 77.6  Temp (24hrs), Avg:98.5 F (36.9 C), Min:98.3 F (36.8 C), Max:98.6 F (37 C)  Recent Labs  Lab 10/26/22 0911 10/26/22 1146  WBC 24.7*  --   CREATININE 1.38*  --   LATICACIDVEN 3.2* 3.0*    Estimated Creatinine Clearance: 41.6 mL/min (A) (by C-G formula based on SCr of 1.38 mg/dL (H)).    No Known Allergies  Antimicrobials this admission: Merrem 1/18 >>  Microbiology results: 1/18 BCx: pending 1/18 UCx: pending  12/11 Ucx: ESBL klebsiella  Thank you for allowing pharmacy to be a part of this patient's care.  Ramond Craver 10/26/2022 2:41 PM

## 2022-10-26 NOTE — ED Provider Notes (Signed)
[facility] EMERGENCY DEPARTMENT Provider Note   CSN: [id number] Arrival date & time: 10/26/22  0865     History  Chief Complaint  Patient presents with   Abdominal Pain    Joshua Osborne is a 82 y.o. 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   Pt c/o 10 of 10 acute onset constant abdominal pain with vomiting that started last night acutely at 1 AM.  Associated with significant nausea vomiting and inability to take p.o.  Denies any fever/chills, chest pain, shortness of breath, urinary symptoms.; pt was seen here a month ago and was admitted for a SBO and states this feels similar except that he is still having bowel movements, last bowel movement was yesterday and it was normal for him.  He is currently on immunotherapy for bladder cancer.  Has history of hernia at the site of the prior left nephrectomy in the left lower quadrant but that is not causing him pain at this time.   Abdominal Pain      Home Medications Prior to Admission medications   Medication Sig Start Date End Date Taking? Authorizing Provider  acetaminophen (TYLENOL) 325 MG tablet Take 2 tablets (650 mg total) by mouth every 6 (six) hours as needed for mild pain (or Fever >/= 101). 10/05/22  Yes Emokpae, Courage, MD  citalopram (CELEXA) 20 MG tablet Take 20 mg by mouth in the morning.   Yes [provider]  Enfortumab Vedotin-ejfv (PADCEV IV) Inject 1 Dose into the vein See admin instructions. Inject 1 dose into the vein on days 1 and 8 every 21 days 08/17/22  Yes [provider]  fluorouracil (EFUDEX) 5 % cream Apply 1 Application topically 2 (two) times daily as needed (cancer spots).   Yes [provider]  latanoprost (XALATAN) 0.005 % ophthalmic solution Place 1 drop into both eyes at  bedtime. 12/19/21  Yes [provider]  lidocaine-prilocaine (EMLA) cream Apply a small amount to port a cath site and cover with plastic wrap 1 hour prior to infusion Patient taking differently: Apply 1 Application topically daily as needed (port access). 08/17/22  Yes Derek Jack, MD  metFORMIN (GLUCOPHAGE) 500 MG tablet Take 500 mg by mouth 2 (two) times daily.   Yes [provider]  metoCLOPramide (REGLAN) 10 MG tablet Take 10 mg by mouth every 6 (six) hours as needed for nausea.   Yes [provider]  ondansetron (ZOFRAN-ODT) 8 MG disintegrating tablet Take 1 tablet (8 mg total) by mouth every 8 (eight) hours as needed for nausea or vomiting. 09/21/22  Yes Derek Jack, MD  Oxycodone HCl 10 MG TABS Take 1 tablet (10 mg total) by mouth every 6 (six) hours as needed. 10/11/22  Yes Derek Jack, MD  Pembrolizumab [facility] IV) Inject 1 Dose into the vein See admin instructions. Inject into the vein every 21 days   Yes [provider]  predniSONE (DELTASONE) 10 MG tablet TAKE 1 TABLET (10 MG TOTAL) BY MOUTH DAILY WITH BREAKFAST. Patient taking differently: Take 5 mg by mouth daily with breakfast. 10/25/22  Yes Derek Jack, MD  prochlorperazine (COMPAZINE) 10 MG tablet Take 1 tablet (10 mg total) by mouth every 6 (six) hours as needed for nausea or vomiting. 08/24/22  Yes Derek Jack, MD  promethazine (PHENERGAN) 25 MG suppository Place 1 suppository (25  mg total) rectally every 6 (six) hours as needed for nausea or vomiting. 09/25/22  Yes Derek Jack, MD  senna-docusate (SENOKOT-S) 8.6-50 MG tablet Take 2 tablets by mouth 2 (two) times daily. 10/05/22 10/05/23 Yes Roxan Hockey, MD  silodosin (RAPAFLO) 8 MG CAPS capsule Take 1 capsule (8 mg total) by mouth daily with breakfast. 10/18/22  Yes McKenzie, Candee Furbish, MD  simvastatin (ZOCOR) 40 MG tablet Take 40 mg by mouth at bedtime.   Yes [provider]   timolol (BETIMOL) 0.5 % ophthalmic solution Place 1 drop into both eyes in the morning.   Yes [provider]  tiZANidine (ZANAFLEX) 2 MG tablet Take 2 mg by mouth at bedtime.   Yes [provider]      Allergies    Patient has no known allergies.    Review of Systems   Review of Systems  Gastrointestinal:  Positive for abdominal pain.   Review of systems Negative for f/c.  A 10 point review of systems was performed and is negative unless otherwise reported in HPI.  Physical Exam Updated Vital Signs BP 100/62   Pulse (!) 126   Temp 98.6 F (37 C)   Resp 13   Ht 6' (1.829 m)   Wt 68.9 kg   SpO2 97%   BMI 20.61 kg/m  Physical Exam General: Normal appearing male, lying in bed.  HEENT: PERRLA, Sclera anicteric, MMM, trachea midline.  Cardiology: Regular tachycardic rate in 130s bpm, no murmurs/rubs/gallops. BL radial and DP pulses equal bilaterally.  Resp: Normal respiratory rate and effort. CTAB, no wheezes, rhonchi, crackles.  Abd: +mod to severe epigastric TTP. Soft, non-distended. No rebound tenderness or guarding. Nontender/soft ventral hernia palpated in LLQ, no erythema or edema of that area. GU: Deferred. MSK: No peripheral edema or signs of trauma. Extremities without deformity or TTP. No cyanosis or clubbing. Skin: warm, dry. No rashes or lesions. Back: No CVA tenderness Neuro: A&Ox4, CNs II-XII grossly intact. MAEs. Sensation grossly intact.  Psych: Normal mood and affect.   ED Results / Procedures / Treatments   Labs (all labs ordered are listed, but only abnormal results are displayed) Labs Reviewed  CBC WITH DIFFERENTIAL/PLATELET - Abnormal; Notable for the following components:      Result Value   WBC 24.7 (*)    Hemoglobin 11.4 (*)    HCT 36.1 (*)    MCV 63.7 (*)    MCH 20.1 (*)    RDW 17.9 (*)    Platelets 402 (*)    Neutro Abs 21.7 (*)    Monocytes Absolute 1.1 (*)    Abs Immature Granulocytes 0.30 (*)    All other components  within normal limits  LACTIC ACID, PLASMA - Abnormal; Notable for the following components:   Lactic Acid, Venous 3.2 (*)    All other components within normal limits  LACTIC ACID, PLASMA - Abnormal; Notable for the following components:   Lactic Acid, Venous 3.0 (*)    All other components within normal limits  COMPREHENSIVE METABOLIC PANEL - Abnormal; Notable for the following components:   Chloride 97 (*)    Glucose, Bld 199 (*)    Creatinine, Ser 1.38 (*)    GFR, Estimated 52 (*)    All other components within normal limits  MAGNESIUM - Abnormal; Notable for the following components:   Magnesium 1.6 (*)    All other components within normal limits  URINALYSIS, ROUTINE W REFLEX MICROSCOPIC - Abnormal; Notable for the following components:  APPearance HAZY (*)    Hgb urine dipstick LARGE (*)    Protein, ur 30 (*)    Leukocytes,Ua LARGE (*)    RBC / HPF >50 (*)    WBC, UA >50 (*)    All other components within normal limits  CULTURE, BLOOD (ROUTINE X 2)  CULTURE, BLOOD (ROUTINE X 2)  URINE CULTURE  LIPASE, BLOOD  PROTIME-INR  APTT    EKG EKG Interpretation  Date/Time:  Thursday October 26 2022 08:53:20 EST Ventricular Rate:  138 PR Interval:  142 QRS Duration: 68 QT Interval:  358 QTC Calculation: 542 R Axis:   46 Text Interpretation: Sinus tachycardia Otherwise normal ECG When compared with ECG of 01-Oct-2022 14:19, PREVIOUS ECG IS PRESENT Confirmed by Cindee Lame [phone]) on 10/26/2022 9:16:58 AM  Radiology CT ABDOMEN PELVIS W CONTRAST  Result Date: 10/26/2022 CLINICAL DATA:  Abdominal pain; history of bladder cancer; * Tracking Code: BO * EXAM: CT ABDOMEN AND PELVIS WITH CONTRAST TECHNIQUE: Multidetector CT imaging of the abdomen and pelvis was performed using the standard protocol following bolus administration of intravenous contrast. RADIATION DOSE REDUCTION: This exam was performed according to the departmental dose-optimization program which includes automated  exposure control, adjustment of the mA and/or kV according to patient size and/or use of iterative reconstruction technique. CONTRAST:  14m OMNIPAQUE IOHEXOL 300 MG/ML  SOLN COMPARISON:  CT abdomen and pelvis dated October 01, 2022 FINDINGS: Lower chest: Solid of the bilateral lower lobes are increased in size. Reference solid pulmonary nodule measuring 7 mm on series 4, image 9, previously 3 mm. Hepatobiliary: Irregular partially calcified low-attenuation liver lesions, similar to prior exam, lack of IV contrast on prior limits evaluation. No new liver lesions. Gallbladder is surgically absent. Mild prominence of the bile ducts is unchanged when compared with prior and not unexpected status post cholecystectomy. Pancreas: Unremarkable. No pancreatic ductal dilatation or surrounding inflammatory changes. Spleen: Normal in size without focal abnormality. Adrenals/Urinary Tract: Prior left nephrectomy and adrenalectomy. Normal appearance of the right kidney with no evidence of hydronephrosis or nephrolithiasis. Normal right adrenal gland. Bladder is unremarkable. Stomach/Bowel: Numerous dilated loops of distal small bowel with transition point seen in the right hemiabdomen on series 3, image 35. Normal appendix. Diverticulosis. No evidence of bowel wall thickening or pneumatosis. Vascular/Lymphatic: Aortic atherosclerosis. Enlarged left retroperitoneal lymph nodes, increased in size when compared with prior exam. Reference node measuring 1.5 cm on series 3, image 40, previously 1.2 cm in short axis. Reproductive: Unchanged prostatomegaly. Other: Trace abdominal ascites, increased when compared with prior exam. New subtle peritoneal nodularity seen adjacent to the spleen on series 3, image 20. No intra-abdominal free air Musculoskeletal: No acute or significant osseous findings. IMPRESSION: 1. Numerous dilated loops of small bowel with transition point seen in the right hemiabdomen, findings are compatible with  small bowel obstruction. 2. Trace abdominal ascites, increased when compared with prior exam. New subtle peritoneal nodularity seen adjacent to the spleen, concerning for peritoneal carcinomatosis, a component of the ascites may also be related to small bowel obstruction. 3. Increased size of left retroperitoneal lymph nodes and increased size of solid nodules of the bilateral lower lobes, consistent with worsened metastatic disease. 4. Irregular partially calcified low-attenuation liver lesions, similar to prior exam, lack of IV contrast on prior limits evaluation. 5. Aortic Atherosclerosis (ICD10-I70.0). Electronically Signed   By: LYetta GlassmanM.D.   On: 10/26/2022 12:16    Procedures Procedures    Medications Ordered in ED Medications  cefTRIAXone (ROCEPHIN) 1 g  in sodium chloride 0.9 % 100 mL IVPB (1 g Intravenous New Bag/Given 10/26/22 1337)  lactated ringers bolus 1,000 mL (0 mLs Intravenous Stopped 10/26/22 1024)  HYDROmorphone (DILAUDID) injection 0.5 mg (0.5 mg Intravenous Given 10/26/22 0936)  ondansetron (ZOFRAN) injection 4 mg (4 mg Intravenous Given 10/26/22 0936)  HYDROmorphone (DILAUDID) injection 0.5 mg (0.5 mg Intravenous Given 10/26/22 1021)  lactated ringers bolus 1,000 mL (0 mLs Intravenous Stopped 10/26/22 1328)  iohexol (OMNIPAQUE) 300 MG/ML solution 100 mL (100 mLs Intravenous Contrast Given 10/26/22 1135)    ED Course/ Medical Decision Making/ A&P                          Medical Decision Making Amount and/or Complexity of Data Reviewed Labs: ordered. Decision-making details documented in ED Course. Radiology: ordered. Decision-making details documented in ED Course.  Risk Prescription drug management. Decision regarding hospitalization.    This patient presents to the ED for concern of abdominal pain, this involves an extensive number of treatment options, and is a complaint that carries with it a high risk of complications and morbidity.  I considered the  following differential and admission for this acute, potentially life threatening condition.   MDM:    For DDX for abdominal pain includes but is not limited to:  Abdominal exam without peritoneal signs. No evidence of acute abdomen at this time. For this patient with severe epigastric abdominal pain and cancer on immunotherapy, consider SBO given recent history and severe N/V; AAA or aortic dissection, pancreatitis, cholecystitis/cholelithiasis, UTI/pyelonephritis, mesenteric ischemia, diverticulitis, appendicitis, or viscous perforation. Will obtain CT A/P and labs. Dilaudid for pain control.  Clinical Course as of 10/26/22 1405  Thu Oct 26, 2022  1121 Lactic Acid, Venous(!!): 3.2 Pts heart rate decreased slightly with 1L IVF. Will give another liter.  [HN]  1121 Attempted aortic ultrasound BS but could not obtain views d/t bowel gas [HN]  1122 Creatinine(!): 1.38 Patient with variable baseline, though this is in line with apprxoiamte baseline [HN]  1122 Lipase: 31 [HN]  1123 Patient with better pain control after 1 mg IV dilaudid total. Patient does take opiates at home. RN called CT to request paitent go ASAP. [HN]  1300 Lactic Acid, Venous(!!): 3.0 Decreasing [HN]  1300 WBC(!): 24.7 [HN]  1300 Urinalysis, Routine w reflex microscopic Urine, Clean Catch(!) +UTI [HN]  1300 CT ABDOMEN PELVIS W CONTRAST 1. Numerous dilated loops of small bowel with transition point seen in the right hemiabdomen, findings are compatible with small bowel obstruction. 2. Trace abdominal ascites, increased when compared with prior exam. New subtle peritoneal nodularity seen adjacent to the spleen, concerning for peritoneal carcinomatosis, a component of the ascites may also be related to small bowel obstruction. 3. Increased size of left retroperitoneal lymph nodes and increased size of solid nodules of the bilateral lower lobes, consistent with worsened metastatic disease. 4. Irregular partially calcified  low-attenuation liver lesions, similar to prior exam, lack of IV contrast on prior limits evaluation. 5. Aortic Atherosclerosis (ICD10-I70.0).   [HN]  3614 [id number] to surgery [HN]  7619 Blood cultures and urine culture drawn. Will start IV ceftriaxone for UTI and c/f sepsis. Patient has received 2 L IVF and HR still elevated, sinus tachycardia. Will start LR gtt.  Surgery states scan looks like possible peritoneal carcinomatosis which makes likelihood of surgery success less. At this time recommend conservative management w/ NPO, NG tube, and IVF w/ medical admission.  discussed results extensively with patient and  his wife.  All questions answered to their satisfaction. Consulted to hospitalist [HN]    Clinical Course User Index [HN] Audley Hose, MD    Labs: I Ordered, and personally interpreted labs.  The pertinent results include:  those listed above  Imaging Studies ordered: I ordered imaging studies including CT A/P I independently visualized and interpreted imaging. I agree with the radiologist interpretation  Additional history obtained from chart review, wife at bedside.   Cardiac Monitoring: The patient was maintained on a cardiac monitor.  I personally viewed and interpreted the cardiac monitored which showed an underlying rhythm of: Sinus tachycardia  Reevaluation: After the interventions noted above, I reevaluated the patient and found that they have :improved  Social Determinants of Health: Patient lives independently   Disposition:  Admit to stepdown  Co morbidities that complicate the patient evaluation  Past Medical History:  Diagnosis Date   Anemia    Anxiety    Arthritis    Bladder tumor    Cancer ([place])    hx of skin cancer, renal cancer   Diabetes mellitus without complication ([place])    GERD (gastroesophageal reflux disease)    Heart murmur    as a child   History of kidney stones    Port-A-Cath in place 08/17/2022   Sleep apnea    cpap      Medicines Meds ordered this encounter  Medications   lactated ringers bolus 1,000 mL   HYDROmorphone (DILAUDID) injection 0.5 mg   ondansetron (ZOFRAN) injection 4 mg   HYDROmorphone (DILAUDID) injection 0.5 mg   lactated ringers bolus 1,000 mL   iohexol (OMNIPAQUE) 300 MG/ML solution 100 mL   cefTRIAXone (ROCEPHIN) 1 g in sodium chloride 0.9 % 100 mL IVPB    Order Specific Question:   Antibiotic Indication:    Answer:   UTI    I have reviewed the patients home medicines and have made adjustments as needed  Problem List / ED Course: Problem List Items Addressed This Visit       Digestive   Small bowel obstruction ([place]) - Primary   Other Visit Diagnoses     Acute cystitis with hematuria                       This note was created using dictation software, which may contain spelling or grammatical errors.    Audley Hose, MD 10/26/22 [phone]

## 2022-10-26 NOTE — ED Triage Notes (Signed)
Pt c/o abdominal pain with vomiting that started last night; pt was seen here a month ago and was admitted for a SBO  Pt has stage 4 bladder cancer. Pt's last chemo treatment was 2 weeks ago

## 2022-10-26 NOTE — Telephone Encounter (Signed)
Wife Joshua Osborne called to advise that patient has been up all night vomiting with severe abdominal pain.  States he is describing it as exactly how it felt when he had a bowel obstruction.  Advised to seek attention in the ER as before.  Verbalized understanding.

## 2022-10-26 NOTE — H&P (Addendum)
History and Physical    Raden Byington GBT:[id number] DOB: 1940/12/11 DOA: 10/26/2022  PCP: Earney Mallet, MD   Patient coming from: Home  Chief Complaint: Abdominal pain/nausea/vomiting  HPI: Daveion Robar is a 82 y.o. 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  No fevers or chills, chest pain, shortness of breath or urinary symptoms noted.  He was recently discharged on 1/7 with SBO at that time.  He is still having bowel movements and his last one was yesterday.   ED Course: Vital signs with sinus tachycardia and soft blood pressure readings currently noted.  Leukocytosis of 24,700 noted and hemoglobin 11.4.  Magnesium 1.6 and lactic acid over 3 on 2 measurements.  He was given 2 L IV fluid bolus in the ED and started on Rocephin empirically.  Review of Systems: Reviewed as noted above, otherwise negative.  Past Medical History:  Diagnosis Date   Anemia    Anxiety    Arthritis    Bladder tumor    Cancer ([place])    hx of skin cancer, renal cancer   Diabetes mellitus without complication (HCC)    GERD (gastroesophageal reflux disease)    Heart murmur    as a child   History of kidney stones    Port-A-Cath in place 08/17/2022   Sleep apnea    cpap    Past Surgical History:  Procedure Laterality Date   bilateral dupreyen contracture surgery      CHOLECYSTECTOMY     CYSTOSCOPY N/A 05/30/2022   Procedure: CYSTOSCOPY WITH TRANSURETHRAL RESECTION OF THE LEFT URETERAL ORIFICE,  TRANSURETHRAL RESECTION OF BLADDER TUMOR;  Surgeon: Ceasar Mons, MD;  Location:  [facility];  Service: Urology;  Laterality: N/A;   CYSTOSCOPY N/A 09/06/2022   Procedure: CYSTOSCOPY;  Surgeon: Cleon Gustin, MD;  Location: [facility];  Service: Urology;  Laterality: N/A;   CYSTOSCOPY WITH RETROGRADE PYELOGRAM, URETEROSCOPY AND STENT PLACEMENT Left 04/26/2022   Procedure: CYSTOSCOPY WITH RETROGRADE PYELOGRAM, URETEROSCOPY AND STENT PLACEMENT;  Surgeon: Cleon Gustin, MD;  Location: [facility];  Service: Urology;  Laterality: Left;   IR IMAGING GUIDED PORT INSERTION  08/16/2022   LIVER BIOPSY     ROBOT ASSITED LAPAROSCOPIC NEPHROURETERECTOMY Left 05/30/2022   Procedure: XI ROBOT ASSITED LAPAROSCOPIC NEPHROURETERECTOMY;  Surgeon: Ceasar Mons, MD;  Location: [facility];  Service: Urology;  Laterality: Left;   TRANSURETHRAL RESECTION OF BLADDER TUMOR  04/26/2022   Procedure: TRANSURETHRAL RESECTION OF BLADDER TUMOR (TURBT);  Surgeon: Cleon Gustin, MD;  Location: [facility];  Service: Urology;;   TRANSURETHRAL RESECTION OF BLADDER TUMOR N/A 09/06/2022   Procedure: TRANSURETHRAL RESECTION OF BLADDER TUMOR (TURBT);  Surgeon: Cleon Gustin, MD;  Location: [facility];  Service: Urology;  Laterality: N/A;     reports that he has quit smoking. His smoking use included cigarettes. He has never used smokeless tobacco. He reports that he does not drink alcohol and does not use drugs.  No Known Allergies  History reviewed. No pertinent family history.  Prior to Admission medications   Medication Sig Start Date End Date Taking? Authorizing Provider  acetaminophen (TYLENOL) 325 MG tablet Take  2 tablets (650 mg total) by mouth every 6 (six) hours as needed for mild pain (or Fever >/= 101). 10/05/22  Yes Emokpae, Courage, MD  citalopram (CELEXA) 20 MG tablet Take 20 mg by mouth in the morning.   Yes [provider]  Enfortumab Vedotin-ejfv (PADCEV IV) Inject 1 Dose into the vein See admin instructions. Inject 1 dose into the vein on days 1 and 8 every 21 days 08/17/22   Yes [provider]  fluorouracil (EFUDEX) 5 % cream Apply 1 Application topically 2 (two) times daily as needed (cancer spots).   Yes [provider]  latanoprost (XALATAN) 0.005 % ophthalmic solution Place 1 drop into both eyes at bedtime. 12/19/21  Yes [provider]  lidocaine-prilocaine (EMLA) cream Apply a small amount to port a cath site and cover with plastic wrap 1 hour prior to infusion Patient taking differently: Apply 1 Application topically daily as needed (port access). 08/17/22  Yes Derek Jack, MD  metFORMIN (GLUCOPHAGE) 500 MG tablet Take 500 mg by mouth 2 (two) times daily.   Yes [provider]  metoCLOPramide (REGLAN) 10 MG tablet Take 10 mg by mouth every 6 (six) hours as needed for nausea.   Yes [provider]  ondansetron (ZOFRAN-ODT) 8 MG disintegrating tablet Take 1 tablet (8 mg total) by mouth every 8 (eight) hours as needed for nausea or vomiting. 09/21/22  Yes Derek Jack, MD  Oxycodone HCl 10 MG TABS Take 1 tablet (10 mg total) by mouth every 6 (six) hours as needed. 10/11/22  Yes Derek Jack, MD  Pembrolizumab Csf - [place] IV) Inject 1 Dose into the vein See admin instructions. Inject into the vein every 21 days   Yes [provider]  predniSONE (DELTASONE) 10 MG tablet TAKE 1 TABLET (10 MG TOTAL) BY MOUTH DAILY WITH BREAKFAST. Patient taking differently: Take 5 mg by mouth daily with breakfast. 10/25/22  Yes Derek Jack, MD  prochlorperazine (COMPAZINE) 10 MG tablet Take 1 tablet (10 mg total) by mouth every 6 (six) hours as needed for nausea or vomiting. 08/24/22  Yes Derek Jack, MD  promethazine (PHENERGAN) 25 MG suppository Place 1 suppository (25 mg total) rectally every 6 (six) hours as needed for nausea or vomiting. 09/25/22  Yes Derek Jack, MD  senna-docusate (SENOKOT-S) 8.6-50 MG tablet Take 2 tablets by mouth 2 (two) times daily. 10/05/22 10/05/23 Yes Roxan Hockey, MD  silodosin (RAPAFLO) 8 MG CAPS capsule Take 1 capsule (8 mg total) by mouth daily with breakfast. 10/18/22  Yes McKenzie, Candee Furbish, MD  simvastatin (ZOCOR) 40 MG tablet Take 40 mg by mouth at bedtime.   Yes [provider]  timolol (BETIMOL) 0.5 % ophthalmic solution Place 1 drop into both eyes in the morning.   Yes [provider]  tiZANidine (ZANAFLEX) 2 MG tablet Take 2 mg by mouth at bedtime.   Yes [provider]    Physical Exam: Vitals:   10/26/22 1200 10/26/22 1300 10/26/22 1329 10/26/22 1330  BP: (!) 146/128 (!) 100/58  100/62  Pulse: 64 (!) 123  (!) 126  Resp: (!) '22 16  13  '$ Temp:   98.6 F (37 C)   TempSrc:      SpO2: (!) 85% 94%  97%  Weight:      Height:        Constitutional: NAD, calm, comfortable Vitals:   10/26/22 1200 10/26/22 1300 10/26/22 1329 10/26/22 1330  BP: (!) 146/128 (!) 100/58  100/62  Pulse: 64 (!) 123  (!)  126  Resp: (!) '22 16  13  '$ Temp:   98.6 F (37 C)   TempSrc:      SpO2: (!) 85% 94%  97%  Weight:      Height:       Eyes: lids and conjunctivae normal Neck: normal, supple Respiratory: clear to auscultation bilaterally. Normal respiratory effort. No accessory muscle use.  Cardiovascular: Regular rate and rhythm, no murmurs. Abdomen: no tenderness, no distention. Bowel sounds positive.  Musculoskeletal:  No edema. Skin: no rashes, lesions, ulcers.  Psychiatric: Flat affect  Labs on Admission: I have personally reviewed following labs and imaging studies  CBC: Recent Labs  Lab 10/26/22 0911  WBC 24.7*  NEUTROABS 21.7*  HGB 11.4*  HCT 36.1*  MCV 63.7*  PLT 921*   Basic Metabolic Panel: Recent Labs  Lab 10/26/22 0911  NA 137  K 4.9  CL 97*  CO2 26  GLUCOSE 199*  BUN 21  CREATININE 1.38*  CALCIUM 9.7  MG 1.6*   GFR: Estimated Creatinine Clearance: 41.6 mL/min (A) (by C-G formula based on SCr of 1.38 mg/dL (H)). Liver Function Tests: Recent Labs  Lab 10/26/22 0911  AST 21  ALT  19  ALKPHOS 78  BILITOT 0.9  PROT 7.8  ALBUMIN 4.0   Recent Labs  Lab 10/26/22 0911  LIPASE 31   No results for input(s): "AMMONIA" in the last 168 hours. Coagulation Profile: No results for input(s): "INR", "PROTIME" in the last 168 hours. Cardiac Enzymes: No results for input(s): "CKTOTAL", "CKMB", "CKMBINDEX", "TROPONINI" in the last 168 hours. BNP (last 3 results) No results for input(s): "PROBNP" in the last 8760 hours. HbA1C: No results for input(s): "HGBA1C" in the last 72 hours. CBG: No results for input(s): "GLUCAP" in the last 168 hours. Lipid Profile: No results for input(s): "CHOL", "HDL", "LDLCALC", "TRIG", "CHOLHDL", "LDLDIRECT" in the last 72 hours. Thyroid Function Tests: No results for input(s): "TSH", "T4TOTAL", "FREET4", "T3FREE", "THYROIDAB" in the last 72 hours. Anemia Panel: No results for input(s): "VITAMINB12", "FOLATE", "FERRITIN", "TIBC", "IRON", "RETICCTPCT" in the last 72 hours. Urine analysis:    Component Value Date/Time   COLORURINE YELLOW 10/26/2022 1215   APPEARANCEUR HAZY (A) 10/26/2022 1215   APPEARANCEUR Cloudy (A) 09/25/2022 0943   LABSPEC 1.027 10/26/2022 1215   PHURINE 5.0 10/26/2022 1215   GLUCOSEU NEGATIVE 10/26/2022 1215   HGBUR LARGE (A) 10/26/2022 1215   BILIRUBINUR NEGATIVE 10/26/2022 1215   BILIRUBINUR Negative 09/25/2022 0943   KETONESUR NEGATIVE 10/26/2022 1215   PROTEINUR 30 (A) 10/26/2022 1215   NITRITE NEGATIVE 10/26/2022 1215   LEUKOCYTESUR LARGE (A) 10/26/2022 1215    Radiological Exams on Admission: CT ABDOMEN PELVIS W CONTRAST  Result Date: 10/26/2022 CLINICAL DATA:  Abdominal pain; history of bladder cancer; * Tracking Code: BO * EXAM: CT ABDOMEN AND PELVIS WITH CONTRAST TECHNIQUE: Multidetector CT imaging of the abdomen and pelvis was performed using the standard protocol following bolus administration of intravenous contrast. RADIATION DOSE REDUCTION: This exam was performed according to the departmental  dose-optimization program which includes automated exposure control, adjustment of the mA and/or kV according to patient size and/or use of iterative reconstruction technique. CONTRAST:  112m OMNIPAQUE IOHEXOL 300 MG/ML  SOLN COMPARISON:  CT abdomen and pelvis dated October 01, 2022 FINDINGS: Lower chest: Solid of the bilateral lower lobes are increased in size. Reference solid pulmonary nodule measuring 7 mm on series 4, image 9, previously 3 mm. Hepatobiliary: Irregular partially calcified low-attenuation liver lesions, similar to prior exam, lack of  IV contrast on prior limits evaluation. No new liver lesions. Gallbladder is surgically absent. Mild prominence of the bile ducts is unchanged when compared with prior and not unexpected status post cholecystectomy. Pancreas: Unremarkable. No pancreatic ductal dilatation or surrounding inflammatory changes. Spleen: Normal in size without focal abnormality. Adrenals/Urinary Tract: Prior left nephrectomy and adrenalectomy. Normal appearance of the right kidney with no evidence of hydronephrosis or nephrolithiasis. Normal right adrenal gland. Bladder is unremarkable. Stomach/Bowel: Numerous dilated loops of distal small bowel with transition point seen in the right hemiabdomen on series 3, image 35. Normal appendix. Diverticulosis. No evidence of bowel wall thickening or pneumatosis. Vascular/Lymphatic: Aortic atherosclerosis. Enlarged left retroperitoneal lymph nodes, increased in size when compared with prior exam. Reference node measuring 1.5 cm on series 3, image 40, previously 1.2 cm in short axis. Reproductive: Unchanged prostatomegaly. Other: Trace abdominal ascites, increased when compared with prior exam. New subtle peritoneal nodularity seen adjacent to the spleen on series 3, image 20. No intra-abdominal free air Musculoskeletal: No acute or significant osseous findings. IMPRESSION: 1. Numerous dilated loops of small bowel with transition point seen in the  right hemiabdomen, findings are compatible with small bowel obstruction. 2. Trace abdominal ascites, increased when compared with prior exam. New subtle peritoneal nodularity seen adjacent to the spleen, concerning for peritoneal carcinomatosis, a component of the ascites may also be related to small bowel obstruction. 3. Increased size of left retroperitoneal lymph nodes and increased size of solid nodules of the bilateral lower lobes, consistent with worsened metastatic disease. 4. Irregular partially calcified low-attenuation liver lesions, similar to prior exam, lack of IV contrast on prior limits evaluation. 5. Aortic Atherosclerosis (ICD10-I70.0). Electronically Signed   By: Yetta Glassman M.D.   On: 10/26/2022 12:16    EKG: Independently reviewed.  Sinus tach 134 bpm  Assessment/Plan Principal Problem:   SBO (small bowel obstruction) (HCC) Active Problems:   Transitional cell carcinoma of renal pelvis, left (HCC)   Acute urinary retention   Transitional cell bladder cancer (HCC)   Controlled type 2 diabetes mellitus without complication, without long-term current use of insulin (HCC)   Mixed hyperlipidemia   Hypomagnesemia   Urothelial carcinoma of kidney, left (HCC)   Chronic kidney disease, stage 3b (HCC)   GERD (gastroesophageal reflux disease)   Low back pain    Recurrent SBO Continue IV fluid Appreciate general surgery recommendations for conservative management and keep n.p.o. 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 Delton Coombes and is on immunotherapy  Neutrophilic leukocytosis Continue to trend, may have elevation  related to sepsis with baseline near 15,000  Acute urinary retention with BPH Currently without Foley catheter Continue Rapaflo  CKD stage IIIb Baseline creatinine 1-1.2 Continue to follow  Chronic microcytic anemia With iron deficiency and Feraheme previously given  Hypomagnesemia Replete and reevaluate  Mixed dyslipidemia Continue statin  Chronic back pain Home medications  Type 2 diabetes SSI every 4 hours Recent hemoglobin A1c 5.3%   DVT prophylaxis: Lovenox Code Status: Full Family Communication: Wife and daughter at bedside Disposition Plan: Admit for treatment of SBO/UTI Consults called: General surgery Admission status: Inpatient, stepdown  Severity of Illness: The appropriate patient status for this  patient is INPATIENT. Inpatient status is judged to be reasonable and necessary in order to provide the required intensity of service to ensure the patient's safety. The patient's presenting symptoms, physical exam findings, and initial radiographic and laboratory data in the context of their chronic comorbidities is felt to place them at high risk for further clinical deterioration. Furthermore, it is not anticipated that the patient will be medically stable for discharge from the hospital within 2 midnights of admission.   * I certify that at the point of admission it is my clinical judgment that the patient will require inpatient hospital care spanning beyond 2 midnights from the point of admission due to high intensity of service, high risk for further deterioration and high frequency of surveillance required.*   Loralai Eisman D Diya Gervasi DO [facility]  If 7PM-7AM, please contact night-coverage [email or web address]  10/26/2022, 1:56 PM

## 2022-10-26 NOTE — Plan of Care (Signed)
  Problem: Safety: Goal: Ability to remain free from injury will improve Outcome: Progressing

## 2022-10-26 NOTE — Consult Note (Signed)
[facility] Consult  Reason for Consult:Small Bowel Obstruction Referring Physician: Dwyane Dee  Chief Complaint   Abdominal Pain     HPI: Rudransh Bellanca is a 82 y.o. 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  The pain started around 1am last night with acute pain and vomiting. He states that this pain is similar to his last SBO. The pain is associated with nausea, vomiting, increased bowel sounds, decreased PO intake.  He had a bowel movement within the last hour and has been passing gas this afternoon, he takes 2 sennakot bid at home and uses a suppository as needed.   Prior to this episode, he had been feeling well and had his best few days since his last hospitalization for SBO. Patient was hospitalized from 10/01/22-10/05/22 with SBO, treated conservatively with NG tube and slow diet advancement.   In the ED he underwent a CT abdomen and pelvis with contrast which demonstrated numerous dilated loops of distal small bowel with transition point seen in the right hemiabdomen. CT also concerning for peritoneal carcinomatosis, which had not previously been seen on imaging.  Past Medical History:  Diagnosis Date   Anemia    Anxiety    Arthritis    Bladder tumor    Cancer ([place])    hx of skin cancer, renal cancer   Diabetes mellitus without complication (HCC)    GERD (gastroesophageal reflux disease)    Heart murmur    as a child   History of kidney stones    Port-A-Cath in place 08/17/2022   Sleep apnea    cpap    Past Surgical History:  Procedure Laterality Date   bilateral dupreyen contracture surgery      CHOLECYSTECTOMY     CYSTOSCOPY N/A 05/30/2022   Procedure: CYSTOSCOPY WITH TRANSURETHRAL RESECTION OF THE LEFT URETERAL ORIFICE,  TRANSURETHRAL RESECTION OF  BLADDER TUMOR;  Surgeon: Ceasar Mons, MD;  Location: [facility];  Service: Urology;  Laterality: N/A;   CYSTOSCOPY N/A 09/06/2022   Procedure: CYSTOSCOPY;  Surgeon: Cleon Gustin, MD;  Location: [facility];  Service: Urology;  Laterality: N/A;   CYSTOSCOPY WITH RETROGRADE PYELOGRAM, URETEROSCOPY AND STENT PLACEMENT Left 04/26/2022   Procedure: CYSTOSCOPY WITH RETROGRADE PYELOGRAM, URETEROSCOPY AND STENT PLACEMENT;  Surgeon: Cleon Gustin, MD;  Location: [facility];  Service: Urology;  Laterality: Left;   IR IMAGING GUIDED PORT INSERTION  08/16/2022   LIVER BIOPSY     ROBOT ASSITED LAPAROSCOPIC NEPHROURETERECTOMY Left 05/30/2022   Procedure: XI ROBOT ASSITED LAPAROSCOPIC NEPHROURETERECTOMY;  Surgeon: Ceasar Mons, MD;  Location: [facility];  Service: Urology;  Laterality: Left;   TRANSURETHRAL RESECTION OF BLADDER TUMOR  04/26/2022   Procedure: TRANSURETHRAL RESECTION OF BLADDER TUMOR (TURBT);  Surgeon: Cleon Gustin, MD;  Location: [facility];  Service: Urology;;   TRANSURETHRAL RESECTION OF BLADDER TUMOR N/A 09/06/2022   Procedure: TRANSURETHRAL RESECTION OF BLADDER TUMOR (TURBT);  Surgeon: Cleon Gustin, MD;  Location: [facility];  Service: Urology;  Laterality: N/A;    History reviewed. No pertinent family history.  Social History   Tobacco Use   Smoking status: Former    Types: Cigarettes   Smokeless tobacco: Never  Vaping Use   Vaping Use: Never used  Substance Use Topics   Alcohol use: Never   Drug use: Never  Medications: I have reviewed the patient's current medications.  No Known Allergies   ROS:  Pertinent items are noted in HPI.  Blood pressure 100/62, pulse (!) 126, temperature 98.6 F (37 C), resp. rate 13, height 6' (1.829 m), weight 68.9 kg, SpO2 97 %. Physical Exam Constitutional:      General: He is not in acute distress. HENT:     Head: Normocephalic and atraumatic.  Pulmonary:     Effort: Pulmonary effort is normal.   Abdominal:     General: There is no distension.     Palpations: Abdomen is soft. There is no mass.     Tenderness: There is abdominal tenderness in the periumbilical area. There is no guarding or rebound.  Neurological:     General: No focal deficit present.     Mental Status: He is alert and oriented to person, place, and time.  Psychiatric:        Mood and Affect: Mood normal.        Behavior: Behavior normal.     Results: Results for orders placed or performed during the hospital encounter of 10/26/22 (from the past 48 hour(s))  CBC with Differential     Status: Abnormal   Collection Time: 10/26/22  9:11 AM  Result Value Ref Range   WBC 24.7 (H) 4.0 - 10.5 K/uL   RBC 5.67 4.22 - 5.81 MIL/uL   Hemoglobin 11.4 (L) 13.0 - 17.0 g/dL   HCT 36.1 (L) 39.0 - 52.0 %   MCV 63.7 (L) 80.0 - 100.0 fL   MCH 20.1 (L) 26.0 - 34.0 pg   MCHC 31.6 30.0 - 36.0 g/dL   RDW 17.9 (H) 11.5 - 15.5 %   Platelets 402 (H) 150 - 400 K/uL   nRBC 0.0 0.0 - 0.2 %   Neutrophils Relative % 88 %   Neutro Abs 21.7 (H) 1.7 - 7.7 K/uL   Lymphocytes Relative 5 %   Lymphs Abs 1.3 0.7 - 4.0 K/uL   Monocytes Relative 5 %   Monocytes Absolute 1.1 (H) 0.1 - 1.0 K/uL   Eosinophils Relative 1 %   Eosinophils Absolute 0.3 0.0 - 0.5 K/uL   Basophils Relative 0 %   Basophils Absolute 0.1 0.0 - 0.1 K/uL   WBC Morphology LEUKOCTYOSIS    RBC Morphology MORPHOLOGY UNREMARKABLE     Comment: ANISO   Smear Review MORPHOLOGY UNREMARKABLE    Immature Granulocytes 1 %   Abs Immature Granulocytes 0.30 (H) 0.00 - 1.27 K/uL   Basophilic Stippling PRESENT    Ovalocytes PRESENT     Comment: Performed at [facility], [address] [place], [place] [address]  Lactic acid, plasma     Status: Abnormal   Collection Time: 10/26/22  9:11 AM  Result Value Ref Range   Lactic Acid, Venous 3.2 (HH) 0.5 - 1.9 mmol/L    Comment: CRITICAL RESULT CALLED TO, READ BACK BY AND VERIFIED WITH: Trude Mcburney RN [place] K FORSYTH Performed at  [facility], [address], [place], [place] [address]   Comprehensive metabolic panel     Status: Abnormal   Collection Time: 10/26/22  9:11 AM  Result Value Ref Range   Sodium 137 135 - 145 mmol/L   Potassium 4.9 3.5 - 5.1 mmol/L   Chloride 97 (L) 98 - 111 mmol/L   CO2 26 22 - 32 mmol/L   Glucose, Bld 199 (H) 70 - 99 mg/dL    Comment: Glucose reference range applies only to samples taken  after fasting for at least 8 hours.   BUN 21 8 - 23 mg/dL   Creatinine, Ser 1.38 (H) 0.61 - 1.24 mg/dL   Calcium 9.7 8.9 - 10.3 mg/dL   Total Protein 7.8 6.5 - 8.1 g/dL   Albumin 4.0 3.5 - 5.0 g/dL   AST 21 15 - 41 U/L   ALT 19 0 - 44 U/L   Alkaline Phosphatase 78 38 - 126 U/L   Total Bilirubin 0.9 0.3 - 1.2 mg/dL   GFR, Estimated 52 (L) >60 mL/min    Comment: (NOTE) Calculated using the CKD-EPI Creatinine Equation (2021)    Anion gap 14 5 - 15    Comment: Performed at [facility], [address], [place], [place] [address]  Lipase, blood     Status: None   Collection Time: 10/26/22  9:11 AM  Result Value Ref Range   Lipase 31 11 - 51 U/L    Comment: Performed at [facility], [address] [place], [place] [address]  Magnesium     Status: Abnormal   Collection Time: 10/26/22  9:11 AM  Result Value Ref Range   Magnesium 1.6 (L) 1.7 - 2.4 mg/dL    Comment: Performed at [facility], [address] [place], [place] [address]  Lactic acid, plasma     Status: Abnormal   Collection Time: 10/26/22 11:46 AM  Result Value Ref Range   Lactic Acid, Venous 3.0 (HH) 0.5 - 1.9 mmol/L    Comment: CRITICAL VALUE NOTED.  VALUE IS CONSISTENT WITH PREVIOUSLY REPORTED AND CALLED VALUE. Performed at [facility], [address], [place], [place] [address]   Urinalysis, Routine w reflex microscopic Urine, Clean Catch     Status: Abnormal   Collection Time: 10/26/22 12:15 PM  Result Value Ref Range   Color, Urine YELLOW YELLOW   APPearance HAZY (A) CLEAR   Specific Gravity, Urine 1.027 1.005 - 1.030    pH 5.0 5.0 - 8.0   Glucose, UA NEGATIVE NEGATIVE mg/dL   Hgb urine dipstick LARGE (A) NEGATIVE   Bilirubin Urine NEGATIVE NEGATIVE   Ketones, ur NEGATIVE NEGATIVE mg/dL   Protein, ur 30 (A) NEGATIVE mg/dL   Nitrite NEGATIVE NEGATIVE   Leukocytes,Ua LARGE (A) NEGATIVE   RBC / HPF >50 (H) 0 - 5 RBC/hpf   WBC, UA >50 (H) 0 - 5 WBC/hpf   Bacteria, UA NONE SEEN NONE SEEN   Squamous Epithelial / HPF 0-5 0 - 5 /HPF   Mucus PRESENT    Hyaline Casts, UA PRESENT     Comment: Performed at [facility], [address], [place], [place] [address]    CT ABDOMEN PELVIS W CONTRAST  Result Date: 10/26/2022 CLINICAL DATA:  Abdominal pain; history of bladder cancer; * Tracking Code: BO * EXAM: CT ABDOMEN AND PELVIS WITH CONTRAST TECHNIQUE: Multidetector CT imaging of the abdomen and pelvis was performed using the standard protocol following bolus administration of intravenous contrast. RADIATION DOSE REDUCTION: This exam was performed according to the departmental dose-optimization program which includes automated exposure control, adjustment of the mA and/or kV according to patient size and/or use of iterative reconstruction technique. CONTRAST:  118m OMNIPAQUE IOHEXOL 300 MG/ML  SOLN COMPARISON:  CT abdomen and pelvis dated October 01, 2022 FINDINGS: Lower chest: Solid of the bilateral lower lobes are increased in size. Reference solid pulmonary nodule measuring 7 mm on series 4, image 9, previously 3 mm. Hepatobiliary: Irregular partially calcified low-attenuation liver lesions, similar to prior exam, lack of IV contrast  on prior limits evaluation. No new liver lesions. Gallbladder is surgically absent. Mild prominence of the bile ducts is unchanged when compared with prior and not unexpected status post cholecystectomy. Pancreas: Unremarkable. No pancreatic ductal dilatation or surrounding inflammatory changes. Spleen: Normal in size without focal abnormality. Adrenals/Urinary Tract: Prior left nephrectomy and  adrenalectomy. Normal appearance of the right kidney with no evidence of hydronephrosis or nephrolithiasis. Normal right adrenal gland. Bladder is unremarkable. Stomach/Bowel: Numerous dilated loops of distal small bowel with transition point seen in the right hemiabdomen on series 3, image 35. Normal appendix. Diverticulosis. No evidence of bowel wall thickening or pneumatosis. Vascular/Lymphatic: Aortic atherosclerosis. Enlarged left retroperitoneal lymph nodes, increased in size when compared with prior exam. Reference node measuring 1.5 cm on series 3, image 40, previously 1.2 cm in short axis. Reproductive: Unchanged prostatomegaly. Other: Trace abdominal ascites, increased when compared with prior exam. New subtle peritoneal nodularity seen adjacent to the spleen on series 3, image 20. No intra-abdominal free air Musculoskeletal: No acute or significant osseous findings. IMPRESSION: 1. Numerous dilated loops of small bowel with transition point seen in the right hemiabdomen, findings are compatible with small bowel obstruction. 2. Trace abdominal ascites, increased when compared with prior exam. New subtle peritoneal nodularity seen adjacent to the spleen, concerning for peritoneal carcinomatosis, a component of the ascites may also be related to small bowel obstruction. 3. Increased size of left retroperitoneal lymph nodes and increased size of solid nodules of the bilateral lower lobes, consistent with worsened metastatic disease. 4. Irregular partially calcified low-attenuation liver lesions, similar to prior exam, lack of IV contrast on prior limits evaluation. 5. Aortic Atherosclerosis (ICD10-I70.0). Electronically Signed   By: Yetta Glassman M.D.   On: 10/26/2022 12:16     Assessment & Plan:  Jatavian Calica is a 82 y.o. 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  Given patient continues to have bowel movements and pass flatus, it is likely that this is partial SBO. Patient has known metastatic disease. 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 Discussed with family the plan for non-operative management including NG tube and slow diet advancement. He has been moving his bowels well at home, will consider bowel regimen if that changes. Likely that his leukocytosis of 24.7 is related to suspected UTI, being treated by primary team. - NPO - NG tube low intermittent suction - IVF and abx per primary team    Natanael Saladin [facility] MS3 10/26/2022, 2:17 PM

## 2022-10-27 DIAGNOSIS — K56609 Unspecified intestinal obstruction, unspecified as to partial versus complete obstruction: Secondary | ICD-10-CM

## 2022-10-27 DIAGNOSIS — C652 Malignant neoplasm of left renal pelvis: Secondary | ICD-10-CM | POA: Diagnosis not present

## 2022-10-27 DIAGNOSIS — C642 Malignant neoplasm of left kidney, except renal pelvis: Secondary | ICD-10-CM

## 2022-10-27 DIAGNOSIS — N3001 Acute cystitis with hematuria: Secondary | ICD-10-CM

## 2022-10-27 LAB — COMPREHENSIVE METABOLIC PANEL
ALT: 11 U/L (ref 0–44)
AST: 12 U/L — ABNORMAL LOW (ref 15–41)
Albumin: 2.6 g/dL — ABNORMAL LOW (ref 3.5–5.0)
Alkaline Phosphatase: 52 U/L (ref 38–126)
Anion gap: 6 (ref 5–15)
BUN: 18 mg/dL (ref 8–23)
CO2: 28 mmol/L (ref 22–32)
Calcium: 8.1 mg/dL — ABNORMAL LOW (ref 8.9–10.3)
Chloride: 103 mmol/L (ref 98–111)
Creatinine, Ser: 1.13 mg/dL (ref 0.61–1.24)
GFR, Estimated: >60 mL/min (ref >60)
Glucose, Bld: 109 mg/dL — ABNORMAL HIGH (ref 70–99)
Potassium: 4.2 mmol/L (ref 3.5–5.1)
Sodium: 137 mmol/L (ref 135–145)
Total Bilirubin: 0.4 mg/dL (ref 0.3–1.2)
Total Protein: 5.2 g/dL — ABNORMAL LOW (ref 6.5–8.1)

## 2022-10-27 LAB — URINE CULTURE

## 2022-10-27 LAB — CBC
HCT: 26.7 % — ABNORMAL LOW (ref 39.0–52.0)
Hemoglobin: 8.2 g/dL — ABNORMAL LOW (ref 13.0–17.0)
MCH: 19.8 pg — ABNORMAL LOW (ref 26.0–34.0)
MCHC: 30.7 g/dL (ref 30.0–36.0)
MCV: 64.5 fL — ABNORMAL LOW (ref 80.0–100.0)
Platelets: 287 10*3/uL (ref 150–400)
RBC: 4.14 MIL/uL — ABNORMAL LOW (ref 4.22–5.81)
RDW: 16.8 % — ABNORMAL HIGH (ref 11.5–15.5)
WBC: 16 10*3/uL — ABNORMAL HIGH (ref 4.0–10.5)
nRBC: 0 % (ref 0.0–0.2)

## 2022-10-27 LAB — GLUCOSE, CAPILLARY
Glucose-Capillary: 112 mg/dL — ABNORMAL HIGH (ref 70–99)
Glucose-Capillary: 107 mg/dL — ABNORMAL HIGH (ref 70–99)
Glucose-Capillary: 129 mg/dL — ABNORMAL HIGH (ref 70–99)
Glucose-Capillary: 107 mg/dL — ABNORMAL HIGH (ref 70–99)
Glucose-Capillary: 107 mg/dL — ABNORMAL HIGH (ref 70–99)

## 2022-10-27 LAB — MAGNESIUM: Magnesium: 1.8 mg/dL (ref 1.7–2.4)

## 2022-10-27 LAB — LACTIC ACID, PLASMA: Lactic Acid, Venous: 0.7 mmol/L (ref 0.5–1.9)

## 2022-10-27 LAB — MRSA NEXT GEN BY PCR, NASAL: MRSA by PCR Next Gen: NOT DETECTED

## 2022-10-27 MED ORDER — SODIUM CHLORIDE 0.9 % IV SOLN
1.0000 g | Freq: Three times a day (TID) | INTRAVENOUS | Status: DC
  Administered 2022-10-27 – 2022-10-31 (×13): 1 g via INTRAVENOUS
  Filled 2022-10-27 (×13): qty 20

## 2022-10-27 MED ORDER — METHOCARBAMOL 1000 MG/10ML IJ SOLN
INTRAMUSCULAR | Status: AC
  Filled 2022-10-27: qty 10

## 2022-10-27 NOTE — TOC Initial Note (Signed)
Transition of Care [facility]) - Initial/Assessment Note    Patient Details  Name: Joshua Osborne MRN: [id number] Date of Birth: [birth year]  [facility] CM/SW Contact:    Iona Beard, [place] Phone Number: 10/27/2022, 3:31 PM  Clinical Narrative:                 Pt is high risk for readmission. CSW spoke with pts wife to complete assessment. Pt lives with his wife. Pt is independent in completing his ADLs. Pts wife is able to provide transportation when needed. Pt has not had HH in the past. Pt has a walker and transport chair to use when needed. TOC to follow.   Expected Discharge Plan: Home/Self Care Barriers to Discharge: Continued Medical Work up   Patient Goals and CMS Choice Patient states their goals for this hospitalization and ongoing recovery are:: return home CMS Medicare.gov Compare Post Acute Care list provided to:: Patient Represenative (must comment) Choice offered to / list presented to : Patient, Spouse      Expected Discharge Plan and Services In-house Referral: Clinical Social Work Discharge Planning Services: CM Consult   Living arrangements for the past 2 months: Single Family Home                                      Prior Living Arrangements/Services Living arrangements for the past 2 months: Single Family Home Lives with:: Spouse Patient language and need for interpreter reviewed:: Yes Do you feel safe going back to the place where you live?: Yes      Need for Family Participation in Patient Care: Yes (Comment) Care giver support system in place?: Yes (comment)   Criminal Activity/Legal Involvement Pertinent to Current Situation/Hospitalization: No - Comment as needed  Activities of Daily Living Home Assistive Devices/Equipment: Eyeglasses, Hearing aid, Walker (specify type) ADL Screening (condition at time of admission) Patient's cognitive ability adequate to safely complete daily activities?: Yes Is the patient deaf or  have difficulty hearing?: Yes Does the patient have difficulty seeing, even when wearing glasses/contacts?: Yes Does the patient have difficulty concentrating, remembering, or making decisions?: No Patient able to express need for assistance with ADLs?: Yes Does the patient have difficulty dressing or bathing?: No Independently performs ADLs?: Yes (appropriate for developmental age) Does the patient have difficulty walking or climbing stairs?: No Weakness of Legs: None Weakness of Arms/Hands: None  Permission Sought/Granted                  Emotional Assessment Appearance:: Appears stated age Attitude/Demeanor/Rapport: Engaged Affect (typically observed): Accepting Orientation: : Oriented to Self, Oriented to Place, Oriented to  Time, Oriented to Situation Alcohol / Substance Use: Not Applicable Psych Involvement: No (comment)  Admission diagnosis:  Small bowel obstruction ([place]) [K56.609] SBO (small bowel obstruction) ([place]) [K56.609] Acute cystitis with hematuria [N30.01] Patient Active Problem List   Diagnosis Date Noted   Acute cystitis with hematuria 10/27/2022   SBO (small bowel obstruction) ([place]) 10/26/2022   Acute urinary retention 10/03/2022   Neck pain 10/03/2022   Dysuria 10/02/2022   Small bowel obstruction ([place]) 10/01/2022   Chronic kidney disease, stage 3b ([place]) 10/01/2022   Port-A-Cath in place 08/17/2022   Urothelial carcinoma of kidney, 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   Acute respiratory failure with hypoxia ([place]) 04/24/2022   AKI (acute kidney injury) ([place]) 04/23/2022   Hypomagnesemia 04/23/2022   Cancer related pain 04/22/2022  Septic shock - RESOLVED 04/21/2022   Acute pyelonephritis 04/21/2022   Controlled type 2 diabetes mellitus without complication, without long-term current use of insulin ([place]) 04/21/2022   Mixed hyperlipidemia 04/21/2022   Transitional cell carcinoma of renal pelvis, left ([place]) 04/04/2022   Transitional cell bladder cancer ([place])  04/04/2022   Low back pain 02/13/2022   Leukocytosis 12/16/2021   Symptomatic anemia 12/16/2021   GERD (gastroesophageal reflux disease) 09/20/2012   Sleep apnea 09/20/2012   PCP:  Earney Mallet, MD Pharmacy:   [id number] [id number]: [phone]: [phone] [place] [address]Phone: [phone]: [phone]    Social Determinants of Health (SDOH) Social History: SDOH Screenings   Food Insecurity: No Food Insecurity (10/27/2022)  Housing: Low Risk  (10/27/2022)  Transportation Needs: No Transportation Needs (10/27/2022)  Utilities: Not At Risk (10/27/2022)  Tobacco Use: Medium Risk (10/26/2022)   SDOH Interventions: Housing Interventions: Intervention Not Indicated   Readmission Risk Interventions    10/27/2022    3:30 PM 10/03/2022   10:56 AM  Readmission Risk Prevention Plan  Transportation Screening Complete Complete  [place] or [place]   Social Work Consult for [place]/Counseling Complete   Palliative Care Screening Not Applicable   Medication Review (Press photographer Complete Complete  HRI or HV[place] Complete  SW Recovery Care/Counseling Consult  Complete  [place] Not Applicable

## 2022-10-27 NOTE — Progress Notes (Signed)
[facility] Progress Note     Subjective: Patient seen and examined.  He is resting comfortably in bed.  He states that his abdominal pain is improving.  He confirms passing flatus, and had another large bowel movement this morning.  He denies any nausea or vomiting.  NG tube remains in place with 500 cc of bilious output overnight.  He is urinating without difficulty as well.  Objective: Vital signs in last 24 hours: Temp:  [98 F (36.7 C)-98.6 F (37 C)] 98.3 F (36.8 C) (01/19 0400) Pulse Rate:  [64-126] 91 (01/19 0900) Resp:  [9-37] 18 (01/19 0900) BP: (82-158)/(33-128) 121/44 (01/19 0900) SpO2:  [85 %-100 %] 93 % (01/19 0900) Weight:  [73.2 kg-75.6 kg] 75.6 kg (01/19 0600) Last BM Date : 10/27/22  Intake/Output from previous day: 01/18 0701 - 01/19 0700 In: 4132.9 [I.V.:1001.8; IV Piggyback:3131] Out: 1676 [Urine:1675; Stool:1] Intake/Output this shift: Total I/O In: 1130.6 [I.V.:955.6; NG/GT:20; IV Piggyback:155] Out: 500 [Emesis/NG output:500]  General appearance: alert, cooperative, and no distress Nose: NG tube in place with bilious output in canister GI: Abdomen soft, nondistended, no percussion tenderness, nontender to palpation; no rigidity, guarding, rebound tenderness  Lab Results:  Recent Labs    10/26/22 0911 10/27/22 0625  WBC 24.7* 16.0*  HGB 11.4* 8.2*  HCT 36.1* 26.7*  PLT 402* 287   BMET Recent Labs    10/26/22 0911 10/27/22 0442  NA 137 137  K 4.9 4.2  CL 97* 103  CO2 26 28  GLUCOSE 199* 109*  BUN 21 18  CREATININE 1.38* 1.13  CALCIUM 9.7 8.1*   PT/INR Recent Labs    10/26/22 1336  LABPROT 13.4  INR 1.0    Studies/Results: DG Abd Portable 1V  Result Date: 10/26/2022 CLINICAL DATA:  [id number] Encounter for nasogastric (NG) tube placement [id number] EXAM: PORTABLE ABDOMEN - 1 VIEW COMPARISON:  October 04, 2023; October 26, 2022 FINDINGS: Enteric tube tip and side port project over the proximal stomach. Status post  cholecystectomy. Revisualization of multiple dilated loops of small bowel, better assessed on same day CT. Bibasilar pulmonary nodules. IMPRESSION: Enteric tube tip and side port project over the proximal stomach. Electronically Signed   By: Valentino Saxon M.D.   On: 10/26/2022 16:32   CT ABDOMEN PELVIS W CONTRAST  Result Date: 10/26/2022 CLINICAL DATA:  Abdominal pain; history of bladder cancer; * Tracking Code: BO * EXAM: CT ABDOMEN AND PELVIS WITH CONTRAST TECHNIQUE: Multidetector CT imaging of the abdomen and pelvis was performed using the standard protocol following bolus administration of intravenous contrast. RADIATION DOSE REDUCTION: This exam was performed according to the departmental dose-optimization program which includes automated exposure control, adjustment of the mA and/or kV according to patient size and/or use of iterative reconstruction technique. CONTRAST:  159m OMNIPAQUE IOHEXOL 300 MG/ML  SOLN COMPARISON:  CT abdomen and pelvis dated October 01, 2022 FINDINGS: Lower chest: Solid of the bilateral lower lobes are increased in size. Reference solid pulmonary nodule measuring 7 mm on series 4, image 9, previously 3 mm. Hepatobiliary: Irregular partially calcified low-attenuation liver lesions, similar to prior exam, lack of IV contrast on prior limits evaluation. No new liver lesions. Gallbladder is surgically absent. Mild prominence of the bile ducts is unchanged when compared with prior and not unexpected status post cholecystectomy. Pancreas: Unremarkable. No pancreatic ductal dilatation or surrounding inflammatory changes. Spleen: Normal in size without focal abnormality. Adrenals/Urinary Tract: Prior left nephrectomy and adrenalectomy. Normal appearance of the right kidney with no evidence of  hydronephrosis or nephrolithiasis. Normal right adrenal gland. Bladder is unremarkable. Stomach/Bowel: Numerous dilated loops of distal small bowel with transition point seen in the right  hemiabdomen on series 3, image 35. Normal appendix. Diverticulosis. No evidence of bowel wall thickening or pneumatosis. Vascular/Lymphatic: Aortic atherosclerosis. Enlarged left retroperitoneal lymph nodes, increased in size when compared with prior exam. Reference node measuring 1.5 cm on series 3, image 40, previously 1.2 cm in short axis. Reproductive: Unchanged prostatomegaly. Other: Trace abdominal ascites, increased when compared with prior exam. New subtle peritoneal nodularity seen adjacent to the spleen on series 3, image 20. No intra-abdominal free air Musculoskeletal: No acute or significant osseous findings. IMPRESSION: 1. Numerous dilated loops of small bowel with transition point seen in the right hemiabdomen, findings are compatible with small bowel obstruction. 2. Trace abdominal ascites, increased when compared with prior exam. New subtle peritoneal nodularity seen adjacent to the spleen, concerning for peritoneal carcinomatosis, a component of the ascites may also be related to small bowel obstruction. 3. Increased size of left retroperitoneal lymph nodes and increased size of solid nodules of the bilateral lower lobes, consistent with worsened metastatic disease. 4. Irregular partially calcified low-attenuation liver lesions, similar to prior exam, lack of IV contrast on prior limits evaluation. 5. Aortic Atherosclerosis (ICD10-I70.0). Electronically Signed   By: Yetta Glassman M.D.   On: 10/26/2022 12:16    Anti-infectives: Anti-infectives (From admission, onward)    Start     Dose/Rate Route Frequency Ordered Stop   10/26/22 1500  meropenem (MERREM) 1 g in sodium chloride 0.9 % 100 mL IVPB        1 g 200 mL/hr over 30 Minutes Intravenous Every 12 hours 10/26/22 1437     10/26/22 1330  cefTRIAXone (ROCEPHIN) 1 g in sodium chloride 0.9 % 100 mL IVPB        1 g 200 mL/hr over 30 Minutes Intravenous  Once 10/26/22 1323 10/26/22 1413       Assessment/Plan:  Patient is a 82 year  old male who was admitted with a small bowel obstruction.  -Given that the patient is passing flatus and moving his bowels, I suspect that this is a partial bowel obstruction -Will maintain NG tube to low intermittent suction at this time -If patient continues to have bowel function, will likely do clamp trial with clear liquids tomorrow to see how he tolerates versus small bowel obstruction protocol -NPO -IV fluids -Antibiotics per primary team for UTI -Continue Dulcolax suppositories -Patient's family had numerous questions regarding his prognosis related to his metastatic bladder cancer.  I advised them that I not the best resource for answering these questions, and that the question should be directed to Dr. Delton Coombes -No acute surgical intervention at this time -Appreciate hospitalist recommendations   LOS: 1 day    Kempton Milne A Rayon Mcchristian 10/27/2022

## 2022-10-27 NOTE — Care Management Important Message (Signed)
Important Message  Patient Details  Name: Joshua Osborne MRN: [id number] Date of Birth: 12-21-1940   Medicare Important Message Given:  Yes     Tommy Medal 10/27/2022, 4:30 PM

## 2022-10-27 NOTE — Consult Note (Signed)
[facility] Consultation Oncology  Name: Joshua Osborne      MRN: [id number]    Location: IC10/IC10-01  Date: 10/27/2022 Time:1:55 PM   REFERRING PHYSICIAN: Dr. Manuella Ghazi  REASON FOR CONSULT: Metastatic urothelial carcinoma   DIAGNOSIS: Partial small bowel obstruction  HISTORY OF PRESENT ILLNESS: Joshua Osborne is a 82 year old male who is known to me from office visits.  He is on active therapy with combination of enfortumab vedotin and pembrolizumab and has received 2 cycles from 08/17/2022.  At last visit on 10/17/2022, he received single agent pembrolizumab.  He was admitted to the hospital from 10/01/2022 through 10/05/2022 with partial small bowel obstruction which was managed conservatively.  He presented to the ER on 10/26/2022 with abdominal pain of acute onset associated with nausea and vomiting which started 1 AM in the morning.  NG tube was placed to low intermittent suction with high output noted.  A CT scan of the abdomen and pelvis showed concern for peritoneal nodularity beside the spleen.  He was also started on antibiotics as his prior culture showed Klebsiella ESBL.  PAST MEDICAL HISTORY:   Past Medical History:  Diagnosis Date   Anemia    Anxiety    Arthritis    Bladder tumor    Cancer ([place])    hx of skin cancer, renal cancer   Diabetes mellitus without complication (HCC)    GERD (gastroesophageal reflux disease)    Heart murmur    as a child   History of kidney stones    Port-A-Cath in place 08/17/2022   Sleep apnea    cpap    ALLERGIES: No Known Allergies    MEDICATIONS: I have reviewed the patient's current medications.     PAST SURGICAL HISTORY Past Surgical History:  Procedure Laterality Date   bilateral dupreyen contracture surgery      CHOLECYSTECTOMY     CYSTOSCOPY N/A 05/30/2022   Procedure: CYSTOSCOPY WITH TRANSURETHRAL RESECTION OF THE LEFT URETERAL ORIFICE,  TRANSURETHRAL RESECTION OF BLADDER TUMOR;  Surgeon: Ceasar Mons,  MD;  Location: [facility];  Service: Urology;  Laterality: N/A;   CYSTOSCOPY N/A 09/06/2022   Procedure: CYSTOSCOPY;  Surgeon: Cleon Gustin, MD;  Location: [facility];  Service: Urology;  Laterality: N/A;   CYSTOSCOPY WITH RETROGRADE PYELOGRAM, URETEROSCOPY AND STENT PLACEMENT Left 04/26/2022   Procedure: CYSTOSCOPY WITH RETROGRADE PYELOGRAM, URETEROSCOPY AND STENT PLACEMENT;  Surgeon: Cleon Gustin, MD;  Location: [facility];  Service: Urology;  Laterality: Left;   IR IMAGING GUIDED PORT INSERTION  08/16/2022   LIVER BIOPSY     ROBOT ASSITED LAPAROSCOPIC NEPHROURETERECTOMY Left 05/30/2022   Procedure: XI ROBOT ASSITED LAPAROSCOPIC NEPHROURETERECTOMY;  Surgeon: Ceasar Mons, MD;  Location: [facility];  Service: Urology;  Laterality: Left;   TRANSURETHRAL RESECTION OF BLADDER TUMOR  04/26/2022   Procedure: TRANSURETHRAL RESECTION OF BLADDER TUMOR (TURBT);  Surgeon: Cleon Gustin, MD;  Location: [facility];  Service: Urology;;   TRANSURETHRAL RESECTION OF BLADDER TUMOR N/A 09/06/2022   Procedure: TRANSURETHRAL RESECTION OF BLADDER TUMOR (TURBT);  Surgeon: Cleon Gustin, MD;  Location: [facility];  Service: Urology;  Laterality: N/A;    FAMILY HISTORY: History reviewed. No pertinent family history.  SOCIAL HISTORY:  reports that he has quit smoking. His smoking use included cigarettes. He has never used smokeless tobacco. He reports that he does not drink alcohol and does not use drugs.  PERFORMANCE STATUS: The patient's performance status is 2 - Symptomatic, <50% confined to bed  PHYSICAL EXAM:  Most Recent Vital Signs: Blood pressure (!) 121/44, pulse 91, temperature 98.3 F (36.8 C), temperature source Oral, resp. rate 18, height 6' (1.829 m), weight 166 lb 10.7 oz (75.6 kg), SpO2 93 %. BP (!) 121/44 (BP Location: Left Arm)   Pulse 91   Temp 98.3 F (36.8 C) (Oral)   Resp 18   Ht 6' (1.829 m)   Wt 166 lb 10.7 oz (75.6 kg)   SpO2 93%   BMI 22.60 kg/m  General  appearance: alert, cooperative, and appears stated age Abdomen:  Soft, tenderness in the epigastric region.  LABORATORY DATA:  Results for orders placed or performed during the hospital encounter of 10/26/22 (from the past 48 hour(s))  CBC with Differential     Status: Abnormal   Collection Time: 10/26/22  9:11 AM  Result Value Ref Range   WBC 24.7 (H) 4.0 - 10.5 K/uL   RBC 5.67 4.22 - 5.81 MIL/uL   Hemoglobin 11.4 (L) 13.0 - 17.0 g/dL   HCT 36.1 (L) 39.0 - 52.0 %   MCV 63.7 (L) 80.0 - 100.0 fL   MCH 20.1 (L) 26.0 - 34.0 pg   MCHC 31.6 30.0 - 36.0 g/dL   RDW 17.9 (H) 11.5 - 15.5 %   Platelets 402 (H) 150 - 400 K/uL   nRBC 0.0 0.0 - 0.2 %   Neutrophils Relative % 88 %   Neutro Abs 21.7 (H) 1.7 - 7.7 K/uL   Lymphocytes Relative 5 %   Lymphs Abs 1.3 0.7 - 4.0 K/uL   Monocytes Relative 5 %   Monocytes Absolute 1.1 (H) 0.1 - 1.0 K/uL   Eosinophils Relative 1 %   Eosinophils Absolute 0.3 0.0 - 0.5 K/uL   Basophils Relative 0 %   Basophils Absolute 0.1 0.0 - 0.1 K/uL   WBC Morphology LEUKOCTYOSIS    RBC Morphology MORPHOLOGY UNREMARKABLE     Comment: ANISO   Smear Review MORPHOLOGY UNREMARKABLE    Immature Granulocytes 1 %   Abs Immature Granulocytes 0.30 (H) 0.00 - 4.65 K/uL   Basophilic Stippling PRESENT    Ovalocytes PRESENT     Comment: Performed at [facility], [address] [place], [place] [address]  Lactic acid, plasma     Status: Abnormal   Collection Time: 10/26/22  9:11 AM  Result Value Ref Range   Lactic Acid, Venous 3.2 (HH) 0.5 - 1.9 mmol/L    Comment: CRITICAL RESULT CALLED TO, READ BACK BY AND VERIFIED WITH: Trude Mcburney RN [place] K FORSYTH Performed at [facility], [address] [place], [name] [address]   Comprehensive metabolic panel     Status: Abnormal   Collection Time: 10/26/22  9:11 AM  Result Value Ref Range   Sodium 137 135 - 145 mmol/L   Potassium 4.9 3.5 - 5.1 mmol/L   Chloride 97 (L) 98 - 111 mmol/L   CO2 26 22 - 32 mmol/L   Glucose, Bld  199 (H) 70 - 99 mg/dL    Comment: Glucose reference range applies only to samples taken after fasting for at least 8 hours.   BUN 21 8 - 23 mg/dL   Creatinine, Ser 1.38 (H) 0.61 - 1.24 mg/dL   Calcium 9.7 8.9 - 10.3 mg/dL   Total Protein 7.8 6.5 - 8.1 g/dL   Albumin 4.0 3.5 - 5.0 g/dL   AST 21 15 - 41 U/L   ALT 19 0 - 44 U/L   Alkaline Phosphatase 78 38 - 126 U/L  Total Bilirubin 0.9 0.3 - 1.2 mg/dL   GFR, Estimated 52 (L) >60 mL/min    Comment: (NOTE) Calculated using the CKD-EPI Creatinine Equation (2021)    Anion gap 14 5 - 15    Comment: Performed at [facility], [address] [place], [place] [address]  Lipase, blood     Status: None   Collection Time: 10/26/22  9:11 AM  Result Value Ref Range   Lipase 31 11 - 51 U/L    Comment: Performed at [facility], [address] [place], [place] [address]  Magnesium     Status: Abnormal   Collection Time: 10/26/22  9:11 AM  Result Value Ref Range   Magnesium 1.6 (L) 1.7 - 2.4 mg/dL    Comment: Performed at [facility], [address], [place], [place] [address]  Lactic acid, plasma     Status: Abnormal   Collection Time: 10/26/22 11:46 AM  Result Value Ref Range   Lactic Acid, Venous 3.0 (HH) 0.5 - 1.9 mmol/L    Comment: CRITICAL VALUE NOTED.  VALUE IS CONSISTENT WITH PREVIOUSLY REPORTED AND CALLED VALUE. Performed at [facility], [address] [place], [place] [address]   Urinalysis, Routine w reflex microscopic Urine, Clean Catch     Status: Abnormal   Collection Time: 10/26/22 12:15 PM  Result Value Ref Range   Color, Urine YELLOW YELLOW   APPearance HAZY (A) CLEAR   Specific Gravity, Urine 1.027 1.005 - 1.030   pH 5.0 5.0 - 8.0   Glucose, UA NEGATIVE NEGATIVE mg/dL   Hgb urine dipstick LARGE (A) NEGATIVE   Bilirubin Urine NEGATIVE NEGATIVE   Ketones, ur NEGATIVE NEGATIVE mg/dL   Protein, ur 30 (A) NEGATIVE mg/dL   Nitrite NEGATIVE NEGATIVE   Leukocytes,Ua LARGE (A) NEGATIVE   RBC / HPF >50 (H) 0 - 5 RBC/hpf    WBC, UA >50 (H) 0 - 5 WBC/hpf   Bacteria, UA NONE SEEN NONE SEEN   Squamous Epithelial / HPF 0-5 0 - 5 /HPF   Mucus PRESENT    Hyaline Casts, UA PRESENT     Comment: Performed at [facility], [address] [place], [place] [address]  Urine Culture     Status: Abnormal   Collection Time: 10/26/22  1:28 PM   Specimen: Urine, Clean Catch  Result Value Ref Range   Specimen Description      URINE, CLEAN CATCH Performed at [facility], [address], [place], [place] [address]    Special Requests      NONE Performed at [facility], [address], [place], [place] [address]    Culture MULTIPLE SPECIES PRESENT, SUGGEST RECOLLECTION (A)    Report Status 10/27/2022 FINAL   Protime-INR     Status: None   Collection Time: 10/26/22  1:36 PM  Result Value Ref Range   Prothrombin Time 13.4 11.4 - 15.2 seconds   INR 1.0 0.8 - 1.2    Comment: (NOTE) INR goal varies based on device and disease states. Performed at [facility], [address], [place], [place] [address]   APTT     Status: None   Collection Time: 10/26/22  1:36 PM  Result Value Ref Range   aPTT 28 24 - 36 seconds    Comment: Performed at [facility], [address], [place], [place] [address]  Blood culture (routine x 2)     Status: None (Preliminary result)   Collection Time: 10/26/22  1:36 PM   Specimen: BLOOD  Result Value  Ref Range   Specimen Description BLOOD LEFT ANTECUBITAL    Special Requests      BOTTLES DRAWN AEROBIC AND ANAEROBIC Blood Culture adequate volume   Culture      NO GROWTH < 24 HOURS Performed at [facility], [address] [place], [place] [address]    Report Status PENDING   Blood culture (routine x 2)     Status: None (Preliminary result)   Collection Time: 10/26/22  1:36 PM   Specimen: BLOOD  Result Value Ref Range   Specimen Description BLOOD RIGHT ANTECUBITAL    Special Requests      BOTTLES DRAWN AEROBIC AND ANAEROBIC Blood Culture adequate volume   Culture      NO GROWTH < 24  HOURS Performed at [facility], [address] [place], [place] [address]    Report Status PENDING   MRSA Next Gen by PCR, Nasal     Status: None   Collection Time: 10/26/22  4:01 PM   Specimen: Nasal Mucosa; Nasal Swab  Result Value Ref Range   MRSA by PCR Next Gen NOT DETECTED NOT DETECTED    Comment: (NOTE) The GeneXpert MRSA Assay (FDA approved for NASAL specimens only), is one component of a comprehensive MRSA colonization surveillance program. It is not intended to diagnose MRSA infection nor to guide or monitor treatment for MRSA infections. Test performance is not FDA approved in patients less than 70 years old. Performed at [facility], [address], [place], [place] [address]   Glucose, capillary     Status: Abnormal   Collection Time: 10/26/22  4:58 PM  Result Value Ref Range   Glucose-Capillary 147 (H) 70 - 99 mg/dL    Comment: Glucose reference range applies only to samples taken after fasting for at least 8 hours.  Glucose, capillary     Status: Abnormal   Collection Time: 10/26/22  8:21 PM  Result Value Ref Range   Glucose-Capillary 125 (H) 70 - 99 mg/dL    Comment: Glucose reference range applies only to samples taken after fasting for at least 8 hours.  Glucose, capillary     Status: Abnormal   Collection Time: 10/26/22 11:57 PM  Result Value Ref Range   Glucose-Capillary 111 (H) 70 - 99 mg/dL    Comment: Glucose reference range applies only to samples taken after fasting for at least 8 hours.   Comment 1 Notify RN    Comment 2 Document in Chart   Glucose, capillary     Status: Abnormal   Collection Time: 10/27/22  3:37 AM  Result Value Ref Range   Glucose-Capillary 112 (H) 70 - 99 mg/dL    Comment: Glucose reference range applies only to samples taken after fasting for at least 8 hours.  Magnesium     Status: None   Collection Time: 10/27/22  4:42 AM  Result Value Ref Range   Magnesium 1.8 1.7 - 2.4 mg/dL    Comment: Performed at [facility],  [address] [place], [place] [address]  Comprehensive metabolic panel     Status: Abnormal   Collection Time: 10/27/22  4:42 AM  Result Value Ref Range   Sodium 137 135 - 145 mmol/L   Potassium 4.2 3.5 - 5.1 mmol/L   Chloride 103 98 - 111 mmol/L   CO2 28 22 - 32 mmol/L   Glucose, Bld 109 (H) 70 - 99 mg/dL    Comment: Glucose reference range applies only to samples taken after fasting for at least  8 hours.   BUN 18 8 - 23 mg/dL   Creatinine, Ser 1.13 0.61 - 1.24 mg/dL   Calcium 8.1 (L) 8.9 - 10.3 mg/dL   Total Protein 5.2 (L) 6.5 - 8.1 g/dL   Albumin 2.6 (L) 3.5 - 5.0 g/dL   AST 12 (L) 15 - 41 U/L   ALT 11 0 - 44 U/L   Alkaline Phosphatase 52 38 - 126 U/L   Total Bilirubin 0.4 0.3 - 1.2 mg/dL   GFR, Estimated >60 >60 mL/min    Comment: (NOTE) Calculated using the CKD-EPI Creatinine Equation (2021)    Anion gap 6 5 - 15    Comment: Performed at [facility], [address], [place], [place] [address]  Lactic acid, plasma     Status: None   Collection Time: 10/27/22  4:42 AM  Result Value Ref Range   Lactic Acid, Venous 0.7 0.5 - 1.9 mmol/L    Comment: Performed at [facility], [address] [place], [place] [address]  CBC     Status: Abnormal   Collection Time: 10/27/22  6:25 AM  Result Value Ref Range   WBC 16.0 (H) 4.0 - 10.5 K/uL   RBC 4.14 (L) 4.22 - 5.81 MIL/uL   Hemoglobin 8.2 (L) 13.0 - 17.0 g/dL    Comment: REPEATED TO VERIFY DELTA CHECK NOTED    HCT 26.7 (L) 39.0 - 52.0 %   MCV 64.5 (L) 80.0 - 100.0 fL   MCH 19.8 (L) 26.0 - 34.0 pg   MCHC 30.7 30.0 - 36.0 g/dL   RDW 16.8 (H) 11.5 - 15.5 %   Platelets 287 150 - 400 K/uL   nRBC 0.0 0.0 - 0.2 %    Comment: Performed at [facility], [address] [place], [place] [address]  Glucose, capillary     Status: Abnormal   Collection Time: 10/27/22 11:44 AM  Result Value Ref Range   Glucose-Capillary 129 (H) 70 - 99 mg/dL    Comment: Glucose reference range applies only to samples taken after fasting for at least 8 hours.       RADIOGRAPHY: DG Abd Portable 1V  Result Date: 10/26/2022 CLINICAL DATA:  [id number] Encounter for nasogastric (NG) tube placement [id number] EXAM: PORTABLE ABDOMEN - 1 VIEW COMPARISON:  October 04, 2023; October 26, 2022 FINDINGS: Enteric tube tip and side port project over the proximal stomach. Status post cholecystectomy. Revisualization of multiple dilated loops of small bowel, better assessed on same day CT. Bibasilar pulmonary nodules. IMPRESSION: Enteric tube tip and side port project over the proximal stomach. Electronically Signed   By: Valentino Saxon M.D.   On: 10/26/2022 16:32   CT ABDOMEN PELVIS W CONTRAST  Result Date: 10/26/2022 CLINICAL DATA:  Abdominal pain; history of bladder cancer; * Tracking Code: BO * EXAM: CT ABDOMEN AND PELVIS WITH CONTRAST TECHNIQUE: Multidetector CT imaging of the abdomen and pelvis was performed using the standard protocol following bolus administration of intravenous contrast. RADIATION DOSE REDUCTION: This exam was performed according to the departmental dose-optimization program which includes automated exposure control, adjustment of the mA and/or kV according to patient size and/or use of iterative reconstruction technique. CONTRAST:  14m OMNIPAQUE IOHEXOL 300 MG/ML  SOLN COMPARISON:  CT abdomen and pelvis dated October 01, 2022 FINDINGS: Lower chest: Solid of the bilateral lower lobes are increased in size. Reference solid pulmonary nodule measuring 7 mm on series 4, image 9, previously 3 mm. Hepatobiliary: Irregular partially calcified low-attenuation liver lesions, similar to prior exam, lack  of IV contrast on prior limits evaluation. No new liver lesions. Gallbladder is surgically absent. Mild prominence of the bile ducts is unchanged when compared with prior and not unexpected status post cholecystectomy. Pancreas: Unremarkable. No pancreatic ductal dilatation or surrounding inflammatory changes. Spleen: Normal in size without focal abnormality.  Adrenals/Urinary Tract: Prior left nephrectomy and adrenalectomy. Normal appearance of the right kidney with no evidence of hydronephrosis or nephrolithiasis. Normal right adrenal gland. Bladder is unremarkable. Stomach/Bowel: Numerous dilated loops of distal small bowel with transition point seen in the right hemiabdomen on series 3, image 35. Normal appendix. Diverticulosis. No evidence of bowel wall thickening or pneumatosis. Vascular/Lymphatic: Aortic atherosclerosis. Enlarged left retroperitoneal lymph nodes, increased in size when compared with prior exam. Reference node measuring 1.5 cm on series 3, image 40, previously 1.2 cm in short axis. Reproductive: Unchanged prostatomegaly. Other: Trace abdominal ascites, increased when compared with prior exam. New subtle peritoneal nodularity seen adjacent to the spleen on series 3, image 20. No intra-abdominal free air Musculoskeletal: No acute or significant osseous findings. IMPRESSION: 1. Numerous dilated loops of small bowel with transition point seen in the right hemiabdomen, findings are compatible with small bowel obstruction. 2. Trace abdominal ascites, increased when compared with prior exam. New subtle peritoneal nodularity seen adjacent to the spleen, concerning for peritoneal carcinomatosis, a component of the ascites may also be related to small bowel obstruction. 3. Increased size of left retroperitoneal lymph nodes and increased size of solid nodules of the bilateral lower lobes, consistent with worsened metastatic disease. 4. Irregular partially calcified low-attenuation liver lesions, similar to prior exam, lack of IV contrast on prior limits evaluation. 5. Aortic Atherosclerosis (ICD10-I70.0). Electronically Signed   By: Yetta Glassman M.D.   On: 10/26/2022 12:16         ASSESSMENT and PLAN:  1.  Recurrent small bowel obstruction: - Small bowel obstruction initially in January 2023 (ad[facility] to [facility]), partial small bowel  obstruction 10/01/2022 through 10/05/2022 - CTAP (10/26/2022): Numerous dilated loops of small bowel with transition point seen in the right Hemi abdomen compatible with SBO. - Being managed with NG tube to low intermittent suction. - Abdominal pain is improving.  He is passing gas and also had a BM this morning. - Appreciate surgery recommendations.  2.  Metastatic urothelial carcinoma: - CTAP (10/26/2022): New subtle peritoneal nodularity adjacent to the spleen.  Left retroperitoneal lymph node 1.5 cm, 1.2 cm on 10/01/2022. - Lower lung nodules have also increased in size. - He is progressing on the current regimen.  His recurrent SBO is precluding him from getting further treatments. - We discussed about palliative care with the intention of good pain control.  He would like to transition to hospice if he gets progressively weaker. - We will make a referral to [facility]. - I would deprescribe simvastatin, Compazine and Zofran.  All questions were answered. The patient knows to call the clinic with any problems, questions or concerns. We can certainly see the patient much sooner if necessary.   Derek Jack

## 2022-10-27 NOTE — Progress Notes (Signed)
PROGRESS NOTE    Joshua Osborne  NLG:[id number] DOB: 01-10-1941 DOA: 10/26/2022 PCP: Earney Mallet, MD   Brief Narrative:    Joshua Osborne is a 82 y.o. 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  Patient was admitted with recurrent SBO as well as concerns for sepsis, present on admission secondary to UTI and has been started on Merrem empirically with urine cultures pending.  Assessment & Plan:   Principal Problem:   SBO (small bowel obstruction) (HCC) Active Problems:   Transitional cell carcinoma of renal pelvis, left (HCC)   Acute urinary retention   Transitional cell bladder cancer (HCC)   Controlled type 2 diabetes mellitus without complication, without long-term current use of insulin (HCC)   Mixed hyperlipidemia   Hypomagnesemia   Urothelial carcinoma of kidney, left (HCC)   Chronic kidney disease, stage 3b (HCC)   GERD (gastroesophageal reflux disease)   Low back pain  Assessment and Plan:  Recurrent SBO Continue IV fluid at lower rate Appreciate ongoing general surgery recommendations for conservative management and keep n.p.o. 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 Delton Coombes and is on immunotherapy Appreciate oncology evaluation as requested   Neutrophilic leukocytosis-improving Continue to trend, may have elevation related to sepsis with baseline near 15,000   Acute urinary retention with BPH Currently without Foley catheter Continue Rapaflo Appreciate urology evaluation as requested   CKD stage IIIb-stable Baseline creatinine 1-1.2 Continue to follow   Chronic microcytic anemia With iron deficiency and Feraheme previously given Currently with hemoglobin drop, but no acute/overt bleeding noted Continue to follow CBC   Mixed dyslipidemia Continue statin   Chronic back pain Home medications   Type 2 diabetes SSI every 4 hours Recent hemoglobin A1c 5.3%    DVT prophylaxis: Lovenox Code Status: Full Family Communication: Wife at bedside 1/19 Disposition Plan: Continue IV hydration and dietary advancement per general surgery recommendations Status is: Inpatient Remains inpatient appropriate because: Need for IV fluid   Consultants:  General surgery Urology Oncology  Procedures:  NG tube placement 1/18  Antimicrobials:  Anti-infectives (From admission, onward)    Start     Dose/Rate Route Frequency Ordered Stop   10/26/22 1500  meropenem (MERREM) 1 g in sodium chloride 0.9 % 100 mL IVPB        1 g 200 mL/hr over 30 Minutes Intravenous Every 12 hours 10/26/22 1437     10/26/22 1330  cefTRIAXone (ROCEPHIN) 1 g in sodium chloride 0.9 % 100 mL IVPB        1 g 200 mL/hr over 30 Minutes Intravenous  Once 10/26/22 1323 10/26/22 1413      Subjective: Patient seen and evaluated today with no new acute complaints or concerns. No acute concerns or events noted overnight.  He has  had a bowel movement and continues to pass flatus.  No further nausea or vomiting noted.  He is urinating well and has not required a catheter.  Objective: Vitals:   10/27/22 0700 10/27/22 0730 10/27/22 0800 10/27/22 0830   BP: (!) 116/48 138/65 (!) 126/42 (!) 134/41  Pulse: 82 91 84 78  Resp: '12 13 12 11  '$ Temp:      TempSrc:      SpO2: 96% 98% 94% 92%  Weight:      Height:        Intake/Output Summary (Last 24 hours) at 10/27/2022 0855 Last data filed at 10/27/2022 0600 Gross per 24 hour  Intake 4132.86 ml  Output 1676 ml  Net 2456.86 ml   Filed Weights   10/26/22 0850 10/26/22 1557 10/27/22 0600  Weight: 68.9 kg 73.2 kg 75.6 kg    Examination:  General exam: Appears calm and comfortable  Respiratory system: Clear to auscultation. Respiratory effort normal. Cardiovascular system: S1 & S2 heard, RRR.  Gastrointestinal system: Abdomen is soft, NG tube with bilious output noted Central nervous system: Alert and awake Extremities: No edema Skin: No significant lesions noted Psychiatry: Flat affect.    Data Reviewed: I have personally reviewed following labs and imaging studies  CBC: Recent Labs  Lab 10/26/22 0911 10/27/22 0625  WBC 24.7* 16.0*  NEUTROABS 21.7*  --   HGB 11.4* 8.2*  HCT 36.1* 26.7*  MCV 63.7* 64.5*  PLT 402* 962   Basic Metabolic Panel: Recent Labs  Lab 10/26/22 0911 10/27/22 0442  NA 137 137  K 4.9 4.2  CL 97* 103  CO2 26 28  GLUCOSE 199* 109*  BUN 21 18  CREATININE 1.38* 1.13  CALCIUM 9.7 8.1*  MG 1.6* 1.8   GFR: Estimated Creatinine Clearance: 55.8 mL/min (by C-G formula based on SCr of 1.13 mg/dL). Liver Function Tests: Recent Labs  Lab 10/26/22 0911 10/27/22 0442  AST 21 12*  ALT 19 11  ALKPHOS 78 52  BILITOT 0.9 0.4  PROT 7.8 5.2*  ALBUMIN 4.0 2.6*   Recent Labs  Lab 10/26/22 0911  LIPASE 31   No results for input(s): "AMMONIA" in the last 168 hours. Coagulation Profile: Recent Labs  Lab 10/26/22 1336  INR 1.0   Cardiac Enzymes: No results for input(s): "CKTOTAL", "CKMB", "CKMBINDEX", "TROPONINI" in the last 168 hours. BNP (last 3 results) No results for input(s): "PROBNP" in the last 8760 hours. HbA1C: No results for  input(s): "HGBA1C" in the last 72 hours. CBG: Recent Labs  Lab 10/26/22 1658 10/26/22 2021 10/26/22 2357 10/27/22 0337  GLUCAP 147* 125* 111* 112*   Lipid Profile: No results for input(s): "CHOL", "HDL", "LDLCALC", "TRIG", "CHOLHDL", "LDLDIRECT" in the last 72 hours. Thyroid Function Tests: No results for input(s): "TSH", "T4TOTAL", "FREET4", "T3FREE", "THYROIDAB" in the last 72 hours. Anemia Panel: No results for input(s): "VITAMINB12", "FOLATE", "FERRITIN", "TIBC", "IRON", "RETICCTPCT" in the last 72 hours. Sepsis Labs: Recent Labs  Lab 10/26/22 0911 10/26/22 1146 10/27/22 0442  LATICACIDVEN 3.2* 3.0* 0.7    Recent Results (from the past 240 hour(s))  Blood culture (routine x 2)     Status: None (Preliminary result)   Collection Time: 10/26/22  1:36 PM   Specimen: BLOOD  Result Value Ref Range Status   Specimen Description BLOOD LEFT ANTECUBITAL  Final   Special Requests   Final    BOTTLES DRAWN AEROBIC AND ANAEROBIC Blood Culture adequate volume   Culture   Final    NO GROWTH <  80 HOURS Performed at [facility], [address] [place], [place] [address]    Report Status PENDING  Incomplete  Blood culture (routine x 2)     Status: None (Preliminary result)   Collection Time: 10/26/22  1:36 PM   Specimen: BLOOD  Result Value Ref Range Status   Specimen Description BLOOD RIGHT ANTECUBITAL  Final   Special Requests   Final    BOTTLES DRAWN AEROBIC AND ANAEROBIC Blood Culture adequate volume   Culture   Final    NO GROWTH < 24 HOURS Performed at [facility], [address] [place], [place] [address]    Report Status PENDING  Incomplete  MRSA Next Gen by PCR, Nasal     Status: None   Collection Time: 10/26/22  4:01 PM   Specimen: Nasal Mucosa; Nasal Swab  Result Value Ref Range Status   MRSA by PCR Next Gen NOT DETECTED NOT DETECTED Final    Comment: (NOTE) The GeneXpert MRSA Assay (FDA approved for NASAL specimens only), is one component of a comprehensive  MRSA colonization surveillance program. It is not intended to diagnose MRSA infection nor to guide or monitor treatment for MRSA infections. Test performance is not FDA approved in patients less than 67 years old. Performed at [facility] [address] [place], [place] [address]          Radiology Studies: DG Abd Portable 1V  Result Date: 10/26/2022 CLINICAL DATA:  [id number] Encounter for nasogastric (NG) tube placement [id number] EXAM: PORTABLE ABDOMEN - 1 VIEW COMPARISON:  October 04, 2023; October 26, 2022 FINDINGS: Enteric tube tip and side port project over the proximal stomach. Status post cholecystectomy. Revisualization of multiple dilated loops of small bowel, better assessed on same day CT. Bibasilar pulmonary nodules. IMPRESSION: Enteric tube tip and side port project over the proximal stomach. Electronically Signed   By: Valentino Saxon M.D.   On: 10/26/2022 16:32   CT ABDOMEN PELVIS W CONTRAST  Result Date: 10/26/2022 CLINICAL DATA:  Abdominal pain; history of bladder cancer; * Tracking Code: BO * EXAM: CT ABDOMEN AND PELVIS WITH CONTRAST TECHNIQUE: Multidetector CT imaging of the abdomen and pelvis was performed using the standard protocol following bolus administration of intravenous contrast. RADIATION DOSE REDUCTION: This exam was performed according to the departmental dose-optimization program which includes automated exposure control, adjustment of the mA and/or kV according to patient size and/or use of iterative reconstruction technique. CONTRAST:  112m OMNIPAQUE IOHEXOL 300 MG/ML  SOLN COMPARISON:  CT abdomen and pelvis dated October 01, 2022 FINDINGS: Lower chest: Solid of the bilateral lower lobes are increased in size. Reference solid pulmonary nodule measuring 7 mm on series 4, image 9, previously 3 mm. Hepatobiliary: Irregular partially calcified low-attenuation liver lesions, similar to prior exam, lack of IV contrast on prior limits evaluation. No new liver lesions.  Gallbladder is surgically absent. Mild prominence of the bile ducts is unchanged when compared with prior and not unexpected status post cholecystectomy. Pancreas: Unremarkable. No pancreatic ductal dilatation or surrounding inflammatory changes. Spleen: Normal in size without focal abnormality. Adrenals/Urinary Tract: Prior left nephrectomy and adrenalectomy. Normal appearance of the right kidney with no evidence of hydronephrosis or nephrolithiasis. Normal right adrenal gland. Bladder is unremarkable. Stomach/Bowel: Numerous dilated loops of distal small bowel with transition point seen in the right hemiabdomen on series 3, image 35. Normal appendix. Diverticulosis. No evidence of bowel wall thickening or pneumatosis. Vascular/Lymphatic: Aortic atherosclerosis. Enlarged left retroperitoneal lymph nodes, increased in size when compared with prior  exam. Reference node measuring 1.5 cm on series 3, image 40, previously 1.2 cm in short axis. Reproductive: Unchanged prostatomegaly. Other: Trace abdominal ascites, increased when compared with prior exam. New subtle peritoneal nodularity seen adjacent to the spleen on series 3, image 20. No intra-abdominal free air Musculoskeletal: No acute or significant osseous findings. IMPRESSION: 1. Numerous dilated loops of small bowel with transition point seen in the right hemiabdomen, findings are compatible with small bowel obstruction. 2. Trace abdominal ascites, increased when compared with prior exam. New subtle peritoneal nodularity seen adjacent to the spleen, concerning for peritoneal carcinomatosis, a component of the ascites may also be related to small bowel obstruction. 3. Increased size of left retroperitoneal lymph nodes and increased size of solid nodules of the bilateral lower lobes, consistent with worsened metastatic disease. 4. Irregular partially calcified low-attenuation liver lesions, similar to prior exam, lack of IV contrast on prior limits evaluation. 5.  Aortic Atherosclerosis (ICD10-I70.0). Electronically Signed   By: Yetta Glassman M.D.   On: 10/26/2022 12:16        Scheduled Meds:  Chlorhexidine Gluconate Cloth  6 each Topical Q0600   citalopram  20 mg Oral Daily   enoxaparin (LOVENOX) injection  40 mg Subcutaneous Q24H   insulin aspart  0-9 Units Subcutaneous Q4H   latanoprost  1 drop Both Eyes QHS   predniSONE  5 mg Oral Q breakfast   simvastatin  40 mg Oral QHS   tamsulosin  0.4 mg Oral QPC breakfast   timolol  1 drop Both Eyes Daily   Continuous Infusions:  acetaminophen 1,000 mg (10/27/22 0557)   lactated ringers 150 mL/hr at 10/27/22 0331   meropenem (MERREM) IV Stopped (10/27/22 0254)   methocarbamol (ROBAXIN) IV 500 mg (10/27/22 0559)     LOS: 1 day    Time spent: 35 minutes    Keaundra Stehle Darleen Crocker, DO [facility]  If 7PM-7AM, please contact night-coverage [email or web address] 10/27/2022, 8:55 AM

## 2022-10-28 ENCOUNTER — Inpatient Hospital Stay (HOSPITAL_COMMUNITY): Payer: MEDICARE

## 2022-10-28 DIAGNOSIS — K56609 Unspecified intestinal obstruction, unspecified as to partial versus complete obstruction: Secondary | ICD-10-CM | POA: Diagnosis not present

## 2022-10-28 LAB — GLUCOSE, CAPILLARY
Glucose-Capillary: 100 mg/dL — ABNORMAL HIGH (ref 70–99)
Glucose-Capillary: 93 mg/dL (ref 70–99)
Glucose-Capillary: 94 mg/dL (ref 70–99)
Glucose-Capillary: 105 mg/dL — ABNORMAL HIGH (ref 70–99)
Glucose-Capillary: 119 mg/dL — ABNORMAL HIGH (ref 70–99)
Glucose-Capillary: 90 mg/dL (ref 70–99)

## 2022-10-28 LAB — BASIC METABOLIC PANEL
Anion gap: 7 (ref 5–15)
BUN: 15 mg/dL (ref 8–23)
CO2: 29 mmol/L (ref 22–32)
Calcium: 8.4 mg/dL — ABNORMAL LOW (ref 8.9–10.3)
Chloride: 100 mmol/L (ref 98–111)
Creatinine, Ser: 1 mg/dL (ref 0.61–1.24)
GFR, Estimated: >60 mL/min (ref >60)
Glucose, Bld: 87 mg/dL (ref 70–99)
Potassium: 3.4 mmol/L — ABNORMAL LOW (ref 3.5–5.1)
Sodium: 136 mmol/L (ref 135–145)

## 2022-10-28 LAB — CBC
HCT: 23.9 % — ABNORMAL LOW (ref 39.0–52.0)
Hemoglobin: 7.5 g/dL — ABNORMAL LOW (ref 13.0–17.0)
MCH: 20.1 pg — ABNORMAL LOW (ref 26.0–34.0)
MCHC: 31.4 g/dL (ref 30.0–36.0)
MCV: 63.9 fL — ABNORMAL LOW (ref 80.0–100.0)
Platelets: 268 10*3/uL (ref 150–400)
RBC: 3.74 MIL/uL — ABNORMAL LOW (ref 4.22–5.81)
RDW: 16.4 % — ABNORMAL HIGH (ref 11.5–15.5)
WBC: 14.4 10*3/uL — ABNORMAL HIGH (ref 4.0–10.5)
nRBC: 0 % (ref 0.0–0.2)

## 2022-10-28 LAB — PREPARE RBC (CROSSMATCH)

## 2022-10-28 LAB — MAGNESIUM: Magnesium: 1.7 mg/dL (ref 1.7–2.4)

## 2022-10-28 MED ORDER — SODIUM CHLORIDE 0.9% IV SOLUTION: Freq: Once | INTRAVENOUS | Status: AC

## 2022-10-28 MED ORDER — DIATRIZOATE MEGLUMINE & SODIUM 66-10 % PO SOLN
90.0000 mL | Freq: Once | ORAL | Status: AC
  Administered 2022-10-28: 90 mL via NASOGASTRIC
  Filled 2022-10-28: qty 90

## 2022-10-28 MED ORDER — POTASSIUM CHLORIDE 10 MEQ/100ML IV SOLN
10.0000 meq | INTRAVENOUS | Status: AC
  Administered 2022-10-28 (×3): 10 meq via INTRAVENOUS
  Filled 2022-10-28 (×3): qty 100

## 2022-10-28 MED ORDER — ALUM & MAG HYDROXIDE-SIMETH 200-200-20 MG/5ML PO SUSP
30.0000 mL | Freq: Four times a day (QID) | ORAL | Status: DC | PRN
  Administered 2022-10-28 – 2022-10-30 (×2): 30 mL via ORAL
  Filled 2022-10-28 (×2): qty 30

## 2022-10-28 NOTE — Progress Notes (Signed)
Patients blood transfusion started at 1308, spouse requested patient finish IV potassium runs due to concern of interaction. MD [place] Crane made aware, MD okay with patient to complete potassium runs. Patient tolerated the first 15 minutes of blood administration with no complaints or adverse effects. Vital signs: T-98.3, P-78, R-18, BP-131/67, O2-97% at room air.

## 2022-10-28 NOTE — Progress Notes (Signed)
Patient c/o pain x2. PRN dilaudid given as ordered. PRN dilaudid effective. NGT flushes well. NGT output noted at this time is 700. Patient emotional about conversation with providers on Friday 10/27/22. This Probation officer gave emotional support and listened to patient.

## 2022-10-28 NOTE — Progress Notes (Signed)
[facility] Progress Note     Subjective: Patient seen and examined.  He is resting comfortably in bed.  He has occasional abdominal pain, which is controlled with pain medications.  He did have 1 episode of nausea yesterday.  NG tube remains in place with 2 L of bilious output.  He had a bowel movement yesterday.  He confirms that he is continuing to pass flatus.  Objective: Vital signs in last 24 hours: Temp:  [98.4 F (36.9 C)-99.2 F (37.3 C)] 99.2 F (37.3 C) (01/20 0441) Pulse Rate:  [89-96] 95 (01/20 0441) Resp:  [17-20] 20 (01/20 0441) BP: (119-155)/(62-66) 140/62 (01/20 0441) SpO2:  [95 %-97 %] 96 % (01/20 0441) Last BM Date : 10/17/22  Intake/Output from previous day: 01/19 0701 - 01/20 0700 In: 3238.9 [P.O.:100; I.V.:2554.7; NG/GT:110; IV Piggyback:474.2] Out: 3500 [Urine:1500; Emesis/NG output:2000] Intake/Output this shift: No intake/output data recorded.  General appearance: alert, cooperative, and no distress GI: Abdomen soft, nondistended, no percussion tenderness, nontender to palpation; no rigidity, guarding, rebound tenderness  Lab Results:  Recent Labs    10/27/22 0625 10/28/22 0442  WBC 16.0* 14.4*  HGB 8.2* 7.5*  HCT 26.7* 23.9*  PLT 287 268   BMET Recent Labs    10/27/22 0442 10/28/22 0442  NA 137 136  K 4.2 3.4*  CL 103 100  CO2 28 29  GLUCOSE 109* 87  BUN 18 15  CREATININE 1.13 1.00  CALCIUM 8.1* 8.4*   PT/INR Recent Labs    10/26/22 1336  LABPROT 13.4  INR 1.0    Studies/Results: DG Abd Portable 1V  Result Date: 10/26/2022 CLINICAL DATA:  [id number] Encounter for nasogastric (NG) tube placement [id number] EXAM: PORTABLE ABDOMEN - 1 VIEW COMPARISON:  October 04, 2023; October 26, 2022 FINDINGS: Enteric tube tip and side port project over the proximal stomach. Status post cholecystectomy. Revisualization of multiple dilated loops of small bowel, better assessed on same day CT. Bibasilar pulmonary nodules. IMPRESSION:  Enteric tube tip and side port project over the proximal stomach. Electronically Signed   By: Valentino Saxon M.D.   On: 10/26/2022 16:32   CT ABDOMEN PELVIS W CONTRAST  Result Date: 10/26/2022 CLINICAL DATA:  Abdominal pain; history of bladder cancer; * Tracking Code: BO * EXAM: CT ABDOMEN AND PELVIS WITH CONTRAST TECHNIQUE: Multidetector CT imaging of the abdomen and pelvis was performed using the standard protocol following bolus administration of intravenous contrast. RADIATION DOSE REDUCTION: This exam was performed according to the departmental dose-optimization program which includes automated exposure control, adjustment of the mA and/or kV according to patient size and/or use of iterative reconstruction technique. CONTRAST:  153m OMNIPAQUE IOHEXOL 300 MG/ML  SOLN COMPARISON:  CT abdomen and pelvis dated October 01, 2022 FINDINGS: Lower chest: Solid of the bilateral lower lobes are increased in size. Reference solid pulmonary nodule measuring 7 mm on series 4, image 9, previously 3 mm. Hepatobiliary: Irregular partially calcified low-attenuation liver lesions, similar to prior exam, lack of IV contrast on prior limits evaluation. No new liver lesions. Gallbladder is surgically absent. Mild prominence of the bile ducts is unchanged when compared with prior and not unexpected status post cholecystectomy. Pancreas: Unremarkable. No pancreatic ductal dilatation or surrounding inflammatory changes. Spleen: Normal in size without focal abnormality. Adrenals/Urinary Tract: Prior left nephrectomy and adrenalectomy. Normal appearance of the right kidney with no evidence of hydronephrosis or nephrolithiasis. Normal right adrenal gland. Bladder is unremarkable. Stomach/Bowel: Numerous dilated loops of distal small bowel with transition point seen in the  right hemiabdomen on series 3, image 35. Normal appendix. Diverticulosis. No evidence of bowel wall thickening or pneumatosis. Vascular/Lymphatic: Aortic  atherosclerosis. Enlarged left retroperitoneal lymph nodes, increased in size when compared with prior exam. Reference node measuring 1.5 cm on series 3, image 40, previously 1.2 cm in short axis. Reproductive: Unchanged prostatomegaly. Other: Trace abdominal ascites, increased when compared with prior exam. New subtle peritoneal nodularity seen adjacent to the spleen on series 3, image 20. No intra-abdominal free air Musculoskeletal: No acute or significant osseous findings. IMPRESSION: 1. Numerous dilated loops of small bowel with transition point seen in the right hemiabdomen, findings are compatible with small bowel obstruction. 2. Trace abdominal ascites, increased when compared with prior exam. New subtle peritoneal nodularity seen adjacent to the spleen, concerning for peritoneal carcinomatosis, a component of the ascites may also be related to small bowel obstruction. 3. Increased size of left retroperitoneal lymph nodes and increased size of solid nodules of the bilateral lower lobes, consistent with worsened metastatic disease. 4. Irregular partially calcified low-attenuation liver lesions, similar to prior exam, lack of IV contrast on prior limits evaluation. 5. Aortic Atherosclerosis (ICD10-I70.0). Electronically Signed   By: Yetta Glassman M.D.   On: 10/26/2022 12:16    Anti-infectives: Anti-infectives (From admission, onward)    Start     Dose/Rate Route Frequency Ordered Stop   10/27/22 1500  meropenem (MERREM) 1 g in sodium chloride 0.9 % 100 mL IVPB        1 g 200 mL/hr over 30 Minutes Intravenous Every 8 hours 10/27/22 1317     10/26/22 1500  meropenem (MERREM) 1 g in sodium chloride 0.9 % 100 mL IVPB  Status:  Discontinued        1 g 200 mL/hr over 30 Minutes Intravenous Every 12 hours 10/26/22 1437 10/27/22 1317   10/26/22 1330  cefTRIAXone (ROCEPHIN) 1 g in sodium chloride 0.9 % 100 mL IVPB        1 g 200 mL/hr over 30 Minutes Intravenous  Once 10/26/22 1323 10/26/22 1413        Assessment/Plan:  Patient is an 82 year old male who was admitted with small bowel obstruction.  -While patient is starting to move his bowels, he did have slightly increased NG tube output in the last 24 hours -Will obtain small bowel obstruction protocol today -NG to LIS unless clamped for small bowel obstruction protocol -NPO -IV fluids -Antibiotics per primary team -Continue Dulcolax suppositories -Dr. Delton Coombes met with the patient yesterday, and he plans to sign onto palliative and possible hospice after discharge from the hospital -I discussed that we will await the findings of the small bowel obstruction protocol and have a discussion if need be regarding potential need for surgery -Appreciate hospitalist recommendations   LOS: 2 days    Joshua Osborne A Kirah Stice 10/28/2022

## 2022-10-28 NOTE — Progress Notes (Signed)
Patient given Gastrografin via NG tube by radiology at 1342 with this writer at bedside, family requested to wait a few minutes after the unit of blood had started before giving contrast due to concern for interaction. Patient reported no complaints. MD Pappayliou made aware of contrast being given per her request.

## 2022-10-28 NOTE — Progress Notes (Signed)
Patient completed blood transfusion with no complaints or adverse effects. Vital signs: T-98.7, P-90, BP-140/84, R-20, O2-97% at room air. Patient has ambulated from bed to [facility]. MD [place] Crane made aware. No new orders.

## 2022-10-28 NOTE — Progress Notes (Signed)
PROGRESS NOTE    Joshua Osborne  SNK:[id number] DOB: 1941-02-27 DOA: 10/26/2022 PCP: Earney Mallet, MD   Brief Narrative:    Trey Gulbranson is a 82 y.o. 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  Admitted with recurrent SBO as well as concern for sepsis, present on admission secondary to UTI, started on Merrem empirically.  Hospital course complicated by high NG tube output, 2 L on 10/28/2022.  10/28/2022: The patient was seen and examined at his bedside.  His wife and son present at bedside. No complaints.  Still having high NG tube output.   Assessment & Plan:   Principal Problem:   SBO (small bowel obstruction) (HCC) Active Problems:   Transitional cell carcinoma of renal pelvis, left (HCC)   Acute urinary retention   Transitional cell bladder cancer (HCC)   Controlled type 2 diabetes mellitus without complication, without long-term current use of insulin (HCC)   Mixed hyperlipidemia   Hypomagnesemia   Urothelial carcinoma of kidney, left (HCC)   Chronic kidney disease, stage 3b (HCC)   GERD (gastroesophageal reflux disease)   Low back pain   Acute cystitis with hematuria  Assessment and Plan:  Recurrent SBO. Continue IV fluid Optimize magnesium and potassium level, magnesium greater than 2.0, potassium greater than 4.0. Mobilize as tolerated IV antiemetics as needed, IV analgesics as needed CT abdomen with concern for peritoneal carcinomatosis NG tube to low intermittent suction with high output noted Appreciate general surgery's assistance.   Severe sepsis secondary to UTI Prior culture with  Klebsiella ESBL, was started on Merrem, continue, complete 5 days. Urine culture multiple species present. Blood cultures no growth to date. 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 Delton Coombes and is on immunotherapy Appreciate oncology evaluation as requested   Neutrophilic leukocytosis-improving WBCs downtrending Afebrile, Tmax 99.2. Monitor fever curve and WBC   Acute urinary retention with BPH Currently without Foley catheter Continue Rapaflo Appreciate urology evaluation as requested   CKD stage IIIb-stable Baseline creatinine 1-1.2 Continue to follow Avoid nephrotoxic agents, dehydration and hypotension.   Chronic microcytic anemia With iron deficiency and Feraheme previously given Currently with hemoglobin drop No overt bleeding reported. Hemoglobin downtrending 7.5 from 8.2 Transfuse 1 unit PRBC on 10/28/2022. Repeat CBC in the morning.   Mixed dyslipidemia Continue statin   Chronic back pain Analgesics as needed   Type 2 diabetes, well-controlled SSI every 4 hours while NPO Recent hemoglobin A1c 5.3%  Hypokalemia Potassium 3.4 Goal potassium 4.0 or greater Repleted intravenously.  Physical debility PT OT assessment Fall precaution Mobilize as tolerated    DVT prophylaxis: Lovenox subcu Lovenox daily Code Status: Full Family Communication: Updated son and wife at bedside. Disposition Plan: Continue IV hydration and dietary advancement per general surgery recommendations Status is: Inpatient Remains inpatient appropriate because: Need for IV fluid   Consultants:  General surgery Urology Oncology  Procedures:  NG tube placement 1/18  Antimicrobials:  Anti-infectives (From admission, onward)    Start     Dose/Rate Route Frequency Ordered Stop   10/27/22 1500  meropenem (MERREM) 1 g in sodium chloride 0.9 % 100 mL  IVPB        1 g 200 mL/hr  over 30 Minutes Intravenous Every 8 hours 10/27/22 1317     10/26/22 1500  meropenem (MERREM) 1 g in sodium chloride 0.9 % 100 mL IVPB  Status:  Discontinued        1 g 200 mL/hr over 30 Minutes Intravenous Every 12 hours 10/26/22 1437 10/27/22 1317   10/26/22 1330  cefTRIAXone (ROCEPHIN) 1 g in sodium chloride 0.9 % 100 mL IVPB        1 g 200 mL/hr over 30 Minutes Intravenous  Once 10/26/22 1323 10/26/22 1413      Subjective: The patient was seen at his bedside.  Has no new complaints.  Son and wife at bedside.  Objective: Vitals:   10/27/22 2045 10/27/22 2354 10/28/22 0441 10/28/22 1252  BP: 119/66 130/62 (!) 140/62 131/63  Pulse: 96 91 95 79  Resp: '20 17 20 18  '$ Temp: 98.6 F (37 C) 98.4 F (36.9 C) 99.2 F (37.3 C) 98.6 F (37 C)  TempSrc: Oral   Oral  SpO2: 96% 95% 96% 97%  Weight:      Height:        Intake/Output Summary (Last 24 hours) at 10/28/2022 1320 Last data filed at 10/28/2022 0500 Gross per 24 hour  Intake 2108.31 ml  Output 2600 ml  Net -491.69 ml   Filed Weights   10/26/22 0850 10/26/22 1557 10/27/22 0600  Weight: 68.9 kg 73.2 kg 75.6 kg    Examination:  General exam: Well-developed well-nourished male presents for he is alert and oriented x 3. Respiratory system: Clear to oscitation no wheezes or rales. Cardiovascular system: Regular rate and rhythm no rubs or gallops.   Gastrointestinal system: NG tube with bilious output.   Central nervous system: Alert and awake. Extremities: No edema Skin: No significant lesions noted. Psychiatry: Mood is appropriate for condition     Data Reviewed: I have personally reviewed following labs and imaging studies  CBC: Recent Labs  Lab 10/26/22 0911 10/27/22 0625 10/28/22 0442  WBC 24.7* 16.0* 14.4*  NEUTROABS 21.7*  --   --   HGB 11.4* 8.2* 7.5*  HCT 36.1* 26.7* 23.9*  MCV 63.7* 64.5* 63.9*  PLT 402* 287 127   Basic Metabolic Panel: Recent Labs  Lab 10/26/22 0911 10/27/22 0442 10/28/22 0442   NA 137 137 136  K 4.9 4.2 3.4*  CL 97* 103 100  CO2 '26 28 29  '$ GLUCOSE 199* 109* 87  BUN '21 18 15  '$ CREATININE 1.38* 1.13 1.00  CALCIUM 9.7 8.1* 8.4*  MG 1.6* 1.8 1.7   GFR: Estimated Creatinine Clearance: 63 mL/min (by C-G formula based on SCr of 1 mg/dL). Liver Function Tests: Recent Labs  Lab 10/26/22 0911 10/27/22 0442  AST 21 12*  ALT 19 11  ALKPHOS 78 52  BILITOT 0.9 0.4  PROT 7.8 5.2*  ALBUMIN 4.0 2.6*   Recent Labs  Lab 10/26/22 0911  LIPASE 31   No results for input(s): "AMMONIA" in the last 168 hours. Coagulation Profile: Recent Labs  Lab 10/26/22 1336  INR 1.0   Cardiac Enzymes: No results for input(s): "CKTOTAL", "CKMB", "CKMBINDEX", "TROPONINI" in the last 168 hours. BNP (last 3 results) No results for input(s): "PROBNP" in the last 8760 hours. HbA1C: No results for input(s): "HGBA1C" in the last 72 hours. CBG: Recent Labs  Lab 10/27/22 2042 10/28/22 0202 10/28/22 0437 10/28/22 0732 10/28/22 1125  GLUCAP 107* 100* 93 94 105*   Lipid  Profile: No results for input(s): "CHOL", "HDL", "LDLCALC", "TRIG", "CHOLHDL", "LDLDIRECT" in the last 72 hours. Thyroid Function Tests: No results for input(s): "TSH", "T4TOTAL", "FREET4", "T3FREE", "THYROIDAB" in the last 72 hours. Anemia Panel: No results for input(s): "VITAMINB12", "FOLATE", "FERRITIN", "TIBC", "IRON", "RETICCTPCT" in the last 72 hours. Sepsis Labs: Recent Labs  Lab 10/26/22 0911 10/26/22 1146 10/27/22 0442  LATICACIDVEN 3.2* 3.0* 0.7    Recent Results (from the past 240 hour(s))  Urine Culture     Status: Abnormal   Collection Time: 10/26/22  1:28 PM   Specimen: Urine, Clean Catch  Result Value Ref Range Status   Specimen Description   Final    URINE, CLEAN CATCH Performed at [facility], [address], [place], [place] [address]    Special Requests   Final    NONE Performed at [facility], [address] [place], [place] [address]    Culture MULTIPLE SPECIES PRESENT,  SUGGEST RECOLLECTION (A)  Final   Report Status 10/27/2022 FINAL  Final  Blood culture (routine x 2)     Status: None (Preliminary result)   Collection Time: 10/26/22  1:36 PM   Specimen: BLOOD  Result Value Ref Range Status   Specimen Description BLOOD LEFT ANTECUBITAL  Final   Special Requests   Final    BOTTLES DRAWN AEROBIC AND ANAEROBIC Blood Culture adequate volume   Culture   Final    NO GROWTH 2 DAYS Performed at [facility], [address] [place], [place] [address]    Report Status PENDING  Incomplete  Blood culture (routine x 2)     Status: None (Preliminary result)   Collection Time: 10/26/22  1:36 PM   Specimen: BLOOD  Result Value Ref Range Status   Specimen Description BLOOD RIGHT ANTECUBITAL  Final   Special Requests   Final    BOTTLES DRAWN AEROBIC AND ANAEROBIC Blood Culture adequate volume   Culture   Final    NO GROWTH 2 DAYS Performed at [facility], [address] [place], [place] [address]    Report Status PENDING  Incomplete  MRSA Next Gen by PCR, Nasal     Status: None   Collection Time: 10/26/22  4:01 PM   Specimen: Nasal Mucosa; Nasal Swab  Result Value Ref Range Status   MRSA by PCR Next Gen NOT DETECTED NOT DETECTED Final    Comment: (NOTE) The GeneXpert MRSA Assay (FDA approved for NASAL specimens only), is one component of a comprehensive MRSA colonization surveillance program. It is not intended to diagnose MRSA infection nor to guide or monitor treatment for MRSA infections. Test performance is not FDA approved in patients less than 52 years old. Performed at [facility], [address], [place],  [address]          Radiology Studies: DG Abd Portable 1V  Result Date: 10/26/2022 CLINICAL DATA:  [id number] Encounter for nasogastric (NG) tube placement [id number] EXAM: PORTABLE ABDOMEN - 1 VIEW COMPARISON:  October 04, 2023; October 26, 2022 FINDINGS: Enteric tube tip and side port project over the proximal stomach. Status post  cholecystectomy. Revisualization of multiple dilated loops of small bowel, better assessed on same day CT. Bibasilar pulmonary nodules. IMPRESSION: Enteric tube tip and side port project over the proximal stomach. Electronically Signed   By: Valentino Saxon M.D.   On: 10/26/2022 16:32        Scheduled Meds:  Chlorhexidine Gluconate Cloth  6 each Topical Q0600   citalopram  20 mg Oral Daily  diatrizoate meglumine-sodium  90 mL Per NG tube Once   enoxaparin (LOVENOX) injection  40 mg Subcutaneous Q24H   insulin aspart  0-9 Units Subcutaneous Q4H   latanoprost  1 drop Both Eyes QHS   predniSONE  5 mg Oral Q breakfast   simvastatin  40 mg Oral QHS   tamsulosin  0.4 mg Oral QPC breakfast   timolol  1 drop Both Eyes Daily   Continuous Infusions:  lactated ringers 100 mL/hr at 10/28/22 0840   meropenem (MERREM) IV 1 g (10/28/22 0549)   methocarbamol (ROBAXIN) IV 500 mg (10/28/22 0454)     LOS: 2 days    Time spent: 35 minutes    Kayleen Memos, DO [facility]  If 7PM-7AM, please contact night-coverage [email or web address] 10/28/2022, 1:20 PM

## 2022-10-29 DIAGNOSIS — K56609 Unspecified intestinal obstruction, unspecified as to partial versus complete obstruction: Secondary | ICD-10-CM | POA: Diagnosis not present

## 2022-10-29 LAB — BASIC METABOLIC PANEL
Anion gap: 10 (ref 5–15)
BUN: 14 mg/dL (ref 8–23)
CO2: 26 mmol/L (ref 22–32)
Calcium: 8.4 mg/dL — ABNORMAL LOW (ref 8.9–10.3)
Chloride: 96 mmol/L — ABNORMAL LOW (ref 98–111)
Creatinine, Ser: 0.98 mg/dL (ref 0.61–1.24)
GFR, Estimated: >60 mL/min (ref >60)
Glucose, Bld: 77 mg/dL (ref 70–99)
Potassium: 3.7 mmol/L (ref 3.5–5.1)
Sodium: 132 mmol/L — ABNORMAL LOW (ref 135–145)

## 2022-10-29 LAB — PHOSPHORUS: Phosphorus: 2.5 mg/dL (ref 2.5–4.6)

## 2022-10-29 LAB — CBC
HCT: 27.7 % — ABNORMAL LOW (ref 39.0–52.0)
Hemoglobin: 8.8 g/dL — ABNORMAL LOW (ref 13.0–17.0)
MCH: 21 pg — ABNORMAL LOW (ref 26.0–34.0)
MCHC: 31.8 g/dL (ref 30.0–36.0)
MCV: 66 fL — ABNORMAL LOW (ref 80.0–100.0)
Platelets: 249 10*3/uL (ref 150–400)
RBC: 4.2 MIL/uL — ABNORMAL LOW (ref 4.22–5.81)
RDW: 18 % — ABNORMAL HIGH (ref 11.5–15.5)
WBC: 13.6 10*3/uL — ABNORMAL HIGH (ref 4.0–10.5)
nRBC: 0 % (ref 0.0–0.2)

## 2022-10-29 LAB — GLUCOSE, CAPILLARY
Glucose-Capillary: 123 mg/dL — ABNORMAL HIGH (ref 70–99)
Glucose-Capillary: 158 mg/dL — ABNORMAL HIGH (ref 70–99)
Glucose-Capillary: 160 mg/dL — ABNORMAL HIGH (ref 70–99)
Glucose-Capillary: 88 mg/dL (ref 70–99)
Glucose-Capillary: 88 mg/dL (ref 70–99)

## 2022-10-29 LAB — MAGNESIUM: Magnesium: 1.6 mg/dL — ABNORMAL LOW (ref 1.7–2.4)

## 2022-10-29 MED ORDER — PROCHLORPERAZINE EDISYLATE 10 MG/2ML IJ SOLN: 5.0000 mg | Freq: Four times a day (QID) | INTRAMUSCULAR | Status: DC | PRN

## 2022-10-29 MED ORDER — MAGNESIUM SULFATE 2 GM/50ML IV SOLN
2.0000 g | Freq: Once | INTRAVENOUS | Status: AC
  Administered 2022-10-29: 2 g via INTRAVENOUS
  Filled 2022-10-29: qty 50

## 2022-10-29 MED ORDER — METHOCARBAMOL 1000 MG/10ML IJ SOLN
INTRAMUSCULAR | Status: AC
  Filled 2022-10-29: qty 10

## 2022-10-29 MED ORDER — KCL-LACTATED RINGERS-D5W 20 MEQ/L IV SOLN
INTRAVENOUS | Status: DC
  Filled 2022-10-29 (×2): qty 1000

## 2022-10-29 MED ORDER — BISACODYL 10 MG RE SUPP: 10.0000 mg | Freq: Every day | RECTAL | Status: DC | PRN

## 2022-10-29 MED ORDER — KCL-LACTATED RINGERS-D5W 20 MEQ/L IV SOLN
INTRAVENOUS | Status: DC
  Filled 2022-10-29 (×5): qty 1000

## 2022-10-29 NOTE — Progress Notes (Signed)
Pharmacy Antibiotic Note  Joshua Osborne is a 82 y.o. male admitted on 10/26/2022 with presumed UTI.  Pharmacy has been consulted for meropenem dosing given leukocytosis.   Patient currently afebrile, wbc trending down. Nothing significant on culture date. 5 day duration of therapy planned per primary team.   Plan: Increase Meropenem to 1000 mg IV every 8 hours  Monitor labs, c/s, and patient improvement.  Height: 6' (182.9 cm) Weight: 75.6 kg (166 lb 10.7 oz) IBW/kg (Calculated) : 77.6  Temp (24hrs), Avg:98.8 F (37.1 C), Min:98.3 F (36.8 C), Max:99.5 F (37.5 C)  Recent Labs  Lab 10/26/22 0911 10/26/22 1146 10/27/22 0442 10/27/22 0625 10/28/22 0442 10/29/22 0441  WBC 24.7*  --   --  16.0* 14.4* 13.6*  CREATININE 1.38*  --  1.13  --  1.00 0.98  LATICACIDVEN 3.2* 3.0* 0.7  --   --   --      Estimated Creatinine Clearance: 64.3 mL/min (by C-G formula based on SCr of 0.98 mg/dL).    No Known Allergies  Antimicrobials this admission: Merrem 1/18 >>  Microbiology results: 1/18 BCx: ngtd 1/18 UCx: multiple species  12/11 Ucx: ESBL klebsiella  Thank you for allowing pharmacy to be a part of this patient's care.  Erin Hearing PharmD., BCPS Clinical Pharmacist 10/29/2022 1:02 PM

## 2022-10-29 NOTE — Progress Notes (Signed)
Patient rested well during this shift. NGT removed around 0508. Notified on call surgeon Dr. Okey Dupre. Ok to leave out b/c NGT was going to be removed this morning around 7-730.

## 2022-10-29 NOTE — Progress Notes (Signed)
PROGRESS NOTE    Joshua Osborne  GEX:[id number] DOB: 26-Dec-1940 DOA: 10/26/2022 PCP: Earney Mallet, MD   Brief Narrative:    Joshua Osborne is a 82 y.o. 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  Admitted with recurrent SBO as well as concern for sepsis, present on admission secondary to UTI, started on Merrem empirically.  Hospital course complicated by high NG tube output, 2 L on 10/28/2022.  After 7 bowel movements on 10/29/2022, NG tube was removed and the patient was started on a clear liquid diet at the recommendation of general surgery.  10/29/2022: The patient was seen and examined at his bedside.  His wife and son were present in the room.  He has no new complaints.  Expresses hunger.   Assessment & Plan:   Principal Problem:   SBO (small bowel obstruction) (HCC) Active Problems:   Transitional cell carcinoma of renal pelvis, left (HCC)   Acute urinary retention   Transitional cell bladder cancer (HCC)   Controlled type 2 diabetes mellitus without complication, without long-term current use of insulin (HCC)   Mixed hyperlipidemia   Hypomagnesemia   Urothelial carcinoma of kidney, left (HCC)   Chronic kidney disease, stage 3b (HCC)   GERD (gastroesophageal reflux disease)   Low back pain   Acute cystitis with hematuria  Assessment and Plan:  Resolved recurrent SBO. Continue IV fluid, LR D5 KCl, reduce rate to 50 cc/h x 1 day Optimize magnesium and potassium level, magnesium greater than 2.0, potassium greater than 4.0. Mobilize as tolerated IV antiemetics as needed, IV analgesics as needed CT abdomen  with concern for peritoneal carcinomatosis NG tube removed on 10/29/22 after 7 bowel movements.   Resolving severe sepsis secondary to UTI Prior culture with Klebsiella ESBL, was started on Merrem, continue, complete 5 days.  Day number 3 out of 5 Urine culture multiple species present however with immunosuppression and bladder cancer we will treat. Blood cultures no growth to date, continue to monitor until finalized.. 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 Delton Coombes and is on immunotherapy Appreciate oncology evaluation as requested   Neutrophilic leukocytosis-improving WBCs downtrending Afebrile, Tmax 99.2. Monitor fever curve and WBC   Acute urinary retention with BPH Currently without Foley catheter Continue Rapaflo Appreciate urology evaluation as requested   Resolved AKI Baseline creatinine 0.9 with GFR greater than 60 Presented with creatinine of 1.3 with GFR 52. Continue to for nephrotoxic agents, dehydration and hypotension. Continue to monitor urine output   Chronic microcytic anemia With iron deficiency and Feraheme previously given Received 1 unit PRBCs on 10/28/2022 for hemoglobin of 7.5. Hemoglobin stable at 8.8 posttransfusion.   Mixed dyslipidemia Continue statin   Chronic back pain Analgesics as needed  Prediabetes, well-controlled Recent hemoglobin A1c 5.3% Previous A1c 5.9 on 04/21/2022 On metformin prior to admission  Resolved post repletion: Hypokalemia Potassium 3.4>> 3.7 Goal potassium 4.0 Repleted intravenously.  Hypomagnesemia Magnesium 1.6 Repleted intravenously.  Hypovolemic hyponatremia Serum sodium 132 IV fluid hydration. Repeat BMP in the morning  Physical debility Assessment by PT with no further recommendations. Continue fall precaution Continue to mobilize as  tolerated    DVT prophylaxis: Lovenox subcu Lovenox daily Code  Status: Full Family Communication: Updated his son and wife at bedside on 10/28/2022, 10/29/2022. Disposition Plan: Continue IV hydration and dietary advancement per general surgery recommendations Status is: Inpatient Remains inpatient appropriate because: Need for IV fluid   Consultants:  General surgery Urology Oncology  Procedures:  NG tube placement 1/18  Antimicrobials:  Anti-infectives (From admission, onward)    Start     Dose/Rate Route Frequency Ordered Stop   10/27/22 1500  meropenem (MERREM) 1 g in sodium chloride 0.9 % 100 mL IVPB        1 g 200 mL/hr over 30 Minutes Intravenous Every 8 hours 10/27/22 1317     10/26/22 1500  meropenem (MERREM) 1 g in sodium chloride 0.9 % 100 mL IVPB  Status:  Discontinued        1 g 200 mL/hr over 30 Minutes Intravenous Every 12 hours 10/26/22 1437 10/27/22 1317   10/26/22 1330  cefTRIAXone (ROCEPHIN) 1 g in sodium chloride 0.9 % 100 mL IVPB        1 g 200 mL/hr over 30 Minutes Intravenous  Once 10/26/22 1323 10/26/22 1413        Objective: Vitals:   10/28/22 1641 10/28/22 2036 10/29/22 0433 10/29/22 1317  BP: (!) 140/84 138/60 (!) 129/57 (!) 145/59  Pulse: 90 80 85 73  Resp: '20 20 16 16  '$ Temp: 98.7 F (37.1 C) 99.1 F (37.3 C) 99.5 F (37.5 C) 97.7 F (36.5 C)  TempSrc: Oral Oral Oral Oral  SpO2: 97% 98% 96% 97%  Weight:      Height:        Intake/Output Summary (Last 24 hours) at 10/29/2022 1604 Last data filed at 10/29/2022 1435 Gross per 24 hour  Intake 1138.99 ml  Output 1440 ml  Net -301.01 ml   Filed Weights   10/26/22 0850 10/26/22 1557 10/27/22 0600  Weight: 68.9 kg 73.2 kg 75.6 kg    Examination:  General exam: Pleasant well-developed well-nourished in no acute distress.  He is alert oriented x 3.   Respiratory system: Clear to auscultation with no wheezes or rales.   Cardiovascular system: Regular rate and rhythm no rubs or gallops.   Gastrointestinal system: NG tube removed.   Central nervous  system: Alert and awake. Extremities: Lower extremity edema bilaterally. Skin: No significant lesions noted. Psychiatry: Mood is appropriate for condition.     Data Reviewed: I have personally reviewed following labs and imaging studies  CBC: Recent Labs  Lab 10/26/22 0911 10/27/22 0625 10/28/22 0442 10/29/22 0441  WBC 24.7* 16.0* 14.4* 13.6*  NEUTROABS 21.7*  --   --   --   HGB 11.4* 8.2* 7.5* 8.8*  HCT 36.1* 26.7* 23.9* 27.7*  MCV 63.7* 64.5* 63.9* 66.0*  PLT 402* 287 268 161   Basic Metabolic Panel: Recent Labs  Lab 10/26/22 0911 10/27/22 0442 10/28/22 0442 10/29/22 0441  NA 137 137 136 132*  K 4.9 4.2 3.4* 3.7  CL 97* 103 100 96*  CO2 '26 28 29 26  '$ GLUCOSE 199* 109* 87 77  BUN '21 18 15 14  '$ CREATININE 1.38* 1.13 1.00 0.98  CALCIUM 9.7 8.1* 8.4* 8.4*  MG 1.6* 1.8 1.7 1.6*  PHOS  --   --   --  2.5   GFR: Estimated Creatinine Clearance: 64.3 mL/min (by C-G formula based on SCr of 0.98 mg/dL). Liver Function Tests: Recent Labs  Lab 10/26/22 0911 10/27/22 0442  AST  21 12*  ALT 19 11  ALKPHOS 78 52  BILITOT 0.9 0.4  PROT 7.8 5.2*  ALBUMIN 4.0 2.6*   Recent Labs  Lab 10/26/22 0911  LIPASE 31   No results for input(s): "AMMONIA" in the last 168 hours. Coagulation Profile: Recent Labs  Lab 10/26/22 1336  INR 1.0   Cardiac Enzymes: No results for input(s): "CKTOTAL", "CKMB", "CKMBINDEX", "TROPONINI" in the last 168 hours. BNP (last 3 results) No results for input(s): "PROBNP" in the last 8760 hours. HbA1C: No results for input(s): "HGBA1C" in the last 72 hours. CBG: Recent Labs  Lab 10/28/22 1600 10/28/22 2033 10/29/22 0003 10/29/22 0748 10/29/22 1308  GLUCAP 119* 90 88 88 160*   Lipid Profile: No results for input(s): "CHOL", "HDL", "LDLCALC", "TRIG", "CHOLHDL", "LDLDIRECT" in the last 72 hours. Thyroid Function Tests: No results for input(s): "TSH", "T4TOTAL", "FREET4", "T3FREE", "THYROIDAB" in the last 72 hours. Anemia Panel: No  results for input(s): "VITAMINB12", "FOLATE", "FERRITIN", "TIBC", "IRON", "RETICCTPCT" in the last 72 hours. Sepsis Labs: Recent Labs  Lab 10/26/22 0911 10/26/22 1146 10/27/22 0442  LATICACIDVEN 3.2* 3.0* 0.7    Recent Results (from the past 240 hour(s))  Urine Culture     Status: Abnormal   Collection Time: 10/26/22  1:28 PM   Specimen: Urine, Clean Catch  Result Value Ref Range Status   Specimen Description   Final    URINE, CLEAN CATCH Performed at [facility], [address], [place], [place] [address]    Special Requests   Final    NONE Performed at [facility], [address], [place], [place] [address]    Culture MULTIPLE SPECIES PRESENT, SUGGEST RECOLLECTION (A)  Final   Report Status 10/27/2022 FINAL  Final  Blood culture (routine x 2)     Status: None (Preliminary result)   Collection Time: 10/26/22  1:36 PM   Specimen: BLOOD  Result Value Ref Range Status   Specimen Description BLOOD LEFT ANTECUBITAL  Final   Special Requests   Final    BOTTLES DRAWN AEROBIC AND ANAEROBIC Blood Culture adequate volume   Culture   Final    NO GROWTH 3 DAYS Performed at [facility], [address] [place], [place] [address]    Report Status PENDING  Incomplete  Blood culture (routine x 2)     Status: None (Preliminary result)   Collection Time: 10/26/22  1:36 PM   Specimen: BLOOD  Result Value Ref Range Status   Specimen Description BLOOD RIGHT ANTECUBITAL  Final   Special Requests   Final    BOTTLES DRAWN AEROBIC AND ANAEROBIC Blood Culture adequate volume   Culture   Final    NO GROWTH 3 DAYS Performed at [facility], [address] [place], [place] [address]    Report Status PENDING  Incomplete  MRSA Next Gen by PCR, Nasal     Status: None   Collection Time: 10/26/22  4:01 PM   Specimen: Nasal Mucosa; Nasal Swab  Result Value Ref Range Status   MRSA by PCR Next Gen NOT DETECTED NOT DETECTED Final    Comment: (NOTE) The GeneXpert MRSA Assay (FDA approved for NASAL  specimens only), is one component of a comprehensive MRSA colonization surveillance program. It is not intended to diagnose MRSA infection nor to guide or monitor treatment for MRSA infections. Test performance is not FDA approved in patients less than 90 years old. Performed at [facility], [address] [place],  [address]  Radiology Studies: DG Abd Portable 1V-Small Bowel Obstruction Protocol-initial, 8 hr delay  Result Date: 10/28/2022 CLINICAL DATA:  Small-bowel obstruction 8 hour follow-up EXAM: PORTABLE ABDOMEN - 1 VIEW COMPARISON:  Abdominal x-ray 10/28/2022 FINDINGS: Enteric tube tip is now at the level of the mid esophagus. No dilated bowel loops are seen. Oral contrast is now seen throughout the ascending colon and transverse colon. Right upper quadrant surgical clips are again seen. Lung bases are clear. No acute fractures. IMPRESSION: 1. No evidence for bowel obstruction. 2. Enteric tube tip is at the level of the mid esophagus. Recommend repositioning. Electronically Signed   By: Ronney Asters M.D.   On: 10/28/2022 21:51   DG Abd Portable 1V-Small Bowel Protocol-Position Verification  Result Date: 10/28/2022 CLINICAL DATA:  NG tube placement. EXAM: PORTABLE ABDOMEN - 1 VIEW COMPARISON:  10/26/2022 and CT 10/26/2022 FINDINGS: Nasogastric tube present coursing into the stomach as tip is not visualized due to obscuration by contrast. Contrast is seen filling the visualized portion of the nasogastric tube with contrast over the stomach, duodenum and proximal jejunum. Interval improvement in the previously seen air-filled mildly dilated small bowel loops. Air seen throughout the colon. No free peritoneal air. Remainder of the exam is unchanged. IMPRESSION: Interval improvement in the previously seen air-filled mildly dilated small bowel loops. Nasogastric tube present with contrast seen within the tube as well as the stomach and normal appearing duodenum and proximal  jejunal loops. Electronically Signed   By: Marin Olp M.D.   On: 10/28/2022 14:17        Scheduled Meds:  Chlorhexidine Gluconate Cloth  6 each Topical Q0600   citalopram  20 mg Oral Daily   enoxaparin (LOVENOX) injection  40 mg Subcutaneous Q24H   latanoprost  1 drop Both Eyes QHS   predniSONE  5 mg Oral Q breakfast   simvastatin  40 mg Oral QHS   tamsulosin  0.4 mg Oral QPC breakfast   timolol  1 drop Both Eyes Daily   Continuous Infusions:  dextrose 5% lactated ringers with KCl 20 mEq/L 100 mL/hr at 10/29/22 1032   meropenem (MERREM) IV 1 g (10/29/22 1435)   methocarbamol (ROBAXIN) IV 500 mg (10/29/22 1404)     LOS: 3 days    Time spent: 35 minutes    Kayleen Memos, DO [facility]  If 7PM-7AM, please contact night-coverage [email or web address] 10/29/2022, 4:04 PM

## 2022-10-29 NOTE — Progress Notes (Signed)
[facility] Progress Note     Subjective: Patient seen and examined.  He is resting comfortably in bed.  He underwent small bowel obstruction protocol yesterday, and 8-hour film demonstrated contrast within the colon.  Plan was to remove NG tube at 7 AM this morning, however patient's NG tube was inadvertently pulled out this morning at 5 AM.  Patient has clear liquid diet ordered, and is tolerating it without issue.  He continues to pass flatus and had multiple bowel movements overnight.  Objective: Vital signs in last 24 hours: Temp:  [98.3 F (36.8 C)-99.5 F (37.5 C)] 99.5 F (37.5 C) (01/21 0433) Pulse Rate:  [78-90] 85 (01/21 0433) Resp:  [16-20] 16 (01/21 0433) BP: (129-140)/(57-84) 129/57 (01/21 0433) SpO2:  [96 %-98 %] 96 % (01/21 0433) Last BM Date : 10/28/22  Intake/Output from previous day: 01/20 0701 - 01/21 0700 In: 1982.2 [I.V.:996.5; Blood:404; IV Piggyback:581.7] Out: 950 [Urine:400; Emesis/NG output:550] Intake/Output this shift: No intake/output data recorded.  General appearance: alert, cooperative, and no distress GI: Abdomen soft, nondistended, no percussion tenderness, nontender to palpation; no rigidity, guarding, or rebound tenderness  Lab Results:  Recent Labs    10/28/22 0442 10/29/22 0441  WBC 14.4* 13.6*  HGB 7.5* 8.8*  HCT 23.9* 27.7*  PLT 268 249   BMET Recent Labs    10/28/22 0442 10/29/22 0441  NA 136 132*  K 3.4* 3.7  CL 100 96*  CO2 29 26  GLUCOSE 87 77  BUN 15 14  CREATININE 1.00 0.98  CALCIUM 8.4* 8.4*   PT/INR Recent Labs    10/26/22 1336  LABPROT 13.4  INR 1.0    Studies/Results: DG Abd Portable 1V-Small Bowel Obstruction Protocol-initial, 8 hr delay  Result Date: 10/28/2022 CLINICAL DATA:  Small-bowel obstruction 8 hour follow-up EXAM: PORTABLE ABDOMEN - 1 VIEW COMPARISON:  Abdominal x-ray 10/28/2022 FINDINGS: Enteric tube tip is now at the level of the mid esophagus. No dilated bowel loops are  seen. Oral contrast is now seen throughout the ascending colon and transverse colon. Right upper quadrant surgical clips are again seen. Lung bases are clear. No acute fractures. IMPRESSION: 1. No evidence for bowel obstruction. 2. Enteric tube tip is at the level of the mid esophagus. Recommend repositioning. Electronically Signed   By: Ronney Asters M.D.   On: 10/28/2022 21:51   DG Abd Portable 1V-Small Bowel Protocol-Position Verification  Result Date: 10/28/2022 CLINICAL DATA:  NG tube placement. EXAM: PORTABLE ABDOMEN - 1 VIEW COMPARISON:  10/26/2022 and CT 10/26/2022 FINDINGS: Nasogastric tube present coursing into the stomach as tip is not visualized due to obscuration by contrast. Contrast is seen filling the visualized portion of the nasogastric tube with contrast over the stomach, duodenum and proximal jejunum. Interval improvement in the previously seen air-filled mildly dilated small bowel loops. Air seen throughout the colon. No free peritoneal air. Remainder of the exam is unchanged. IMPRESSION: Interval improvement in the previously seen air-filled mildly dilated small bowel loops. Nasogastric tube present with contrast seen within the tube as well as the stomach and normal appearing duodenum and proximal jejunal loops. Electronically Signed   By: Marin Olp M.D.   On: 10/28/2022 14:17    Anti-infectives: Anti-infectives (From admission, onward)    Start     Dose/Rate Route Frequency Ordered Stop   10/27/22 1500  meropenem (MERREM) 1 g in sodium chloride 0.9 % 100 mL IVPB        1 g 200 mL/hr over 30 Minutes Intravenous  Every 8 hours 10/27/22 1317     10/26/22 1500  meropenem (MERREM) 1 g in sodium chloride 0.9 % 100 mL IVPB  Status:  Discontinued        1 g 200 mL/hr over 30 Minutes Intravenous Every 12 hours 10/26/22 1437 10/27/22 1317   10/26/22 1330  cefTRIAXone (ROCEPHIN) 1 g in sodium chloride 0.9 % 100 mL IVPB        1 g 200 mL/hr over 30 Minutes Intravenous  Once 10/26/22  1323 10/26/22 1413       Assessment/Plan:  Patient is an 82 year old male who is admitted with small bowel obstruction.  -Small bowel obstruction protocol demonstrated contrast within the colon at 8 hours -NG tube removed this morning -Patient tolerating clears, will advance to full liquids -If patient tolerates full liquids, can be advanced to a GI soft -IV fluids per primary team -Continue Dulcolax suppositories -Antibiotics per primary team -No need for acute surgical intervention at this time -Appreciate hospitalist recommendations   LOS: 3 days    [place] 10/29/2022

## 2022-10-29 NOTE — Evaluation (Signed)
Physical Therapy Evaluation Patient Details Name: Joshua [facility] MRN: [id number] DOB: 01/19/1941 Today's Date: 10/29/2022  History of Present Illness  Dayyan Krist is a 82 y.o. 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  No fevers or chills, chest pain, shortness of breath or urinary symptoms noted.  He was recently discharged on 1/7 with SBO at that time.  He is still having bowel movements and his last one was yesterday.   Clinical Impression  Patient functioning at baseline for functional mobility and gait demonstrating good return for ambulating in room/hallway without loss of balance.  Plan:  Patient discharged from physical therapy to care of nursing for ambulation daily as tolerated for length of stay.         Recommendations for follow up therapy are one component of a multi-disciplinary discharge planning process, led by the attending physician.  Recommendations may be updated based on patient status, additional functional criteria and insurance authorization.  Follow Up Recommendations No PT follow up      Assistance Recommended at Discharge Set up Supervision/Assistance  Patient can return home with the following  Other (comment) (near baseline)    Equipment Recommendations None recommended by PT  Recommendations for Other Services       Functional Status Assessment Patient has had a recent decline in their functional status and/or demonstrates limited ability to make significant improvements in function in a reasonable and predictable amount of time      Precautions / Restrictions Precautions Precautions: None Restrictions Weight Bearing Restrictions: No      Mobility  Bed Mobility Overal bed mobility: Modified Independent                  Transfers Overall transfer level: Modified independent                      Ambulation/Gait Ambulation/Gait assistance: Modified independent (Device/Increase time) Gait Distance (Feet): 200 Feet Assistive device: None Gait Pattern/deviations: WFL(Within Functional Limits) Gait velocity: slightly decreased     General Gait Details: grossly WFL with good return for ambulating on level, inclined and declined surfaces without loss of balance  Stairs            Wheelchair Mobility    Modified Rankin (Stroke Patients Only)       Balance Overall balance assessment: No apparent balance deficits (not formally assessed)                                           Pertinent Vitals/Pain Pain Assessment Pain Assessment: No/denies pain    Home Living Family/patient expects to be discharged to:: Private residence Living Arrangements: Spouse/significant other Available Help at Discharge: Family;Available 24 hours/day Type of Home: House Home Access: Level entry       Home Layout: Able to live on main level with bedroom/bathroom;Two level Home Equipment: Cane - quad;Cane - single point;BSC/3in1;Shower seat      Prior Function Prior Level of Function : Driving;Independent/Modified Independent  Mobility Comments: household and short distanced community ambulator using RW PRN ADLs Comments: assisted by family     Hand Dominance        Extremity/Trunk Assessment   Upper Extremity Assessment Upper Extremity Assessment: Overall WFL for tasks assessed    Lower Extremity Assessment Lower Extremity Assessment: Overall WFL for tasks assessed    Cervical / Trunk Assessment Cervical / Trunk Assessment: Normal  Communication    Communication: No difficulties  Cognition Arousal/Alertness: Awake/alert Behavior During Therapy: WFL for tasks assessed/performed Overall Cognitive Status: Within Functional Limits for tasks assessed                                          General Comments      Exercises     Assessment/Plan    PT Assessment Patient does not need any further PT services  PT Problem List         PT Treatment Interventions      PT Goals (Current goals can be found in the Care Plan section)  Acute Rehab PT Goals Patient Stated Goal: return home with family to assist PT Goal Formulation: With patient/family Time For Goal Achievement: 10/29/22 Potential to Achieve Goals: Good    Frequency       Co-evaluation               AM-PAC PT "6 Clicks" Mobility  Outcome Measure Help needed turning from your back to your side while in a flat bed without using bedrails?: None Help needed moving from lying on your back to sitting on the side of a flat bed without using bedrails?: None Help needed moving to and from a bed to a chair (including a wheelchair)?: None Help needed standing up from a chair using your arms (e.g., wheelchair or bedside chair)?: None Help needed to walk in hospital room?: None Help needed climbing 3-5 steps with a railing? : None 6 Click Score: 24    End of Session   Activity Tolerance: Patient tolerated treatment well Patient left: in bed;with call bell/phone within reach Nurse Communication: Mobility status PT Visit Diagnosis: Unsteadiness on feet (R26.81);Other abnormalities of gait and mobility (R26.89);Muscle weakness (generalized) (M62.81)    Time: 7371-0626 PT Time Calculation (min) (ACUTE ONLY): 10 min   Charges:   PT Evaluation $PT Eval Low Complexity: 1 Low PT Treatments $Therapeutic Activity: 8-22 mins        1:10 PM, 10/29/22 Lonell Grandchild, MPT Physical Therapist with [facility] 336 [phone] office [phone]  mobile phone

## 2022-10-30 ENCOUNTER — Other Ambulatory Visit: Payer: MEDICARE

## 2022-10-30 ENCOUNTER — Encounter (HOSPITAL_COMMUNITY): Payer: Self-pay | Admitting: *Deleted

## 2022-10-30 ENCOUNTER — Ambulatory Visit: Payer: MEDICARE

## 2022-10-30 DIAGNOSIS — K56609 Unspecified intestinal obstruction, unspecified as to partial versus complete obstruction: Secondary | ICD-10-CM | POA: Diagnosis not present

## 2022-10-30 LAB — CBC
HCT: 28.9 % — ABNORMAL LOW (ref 39.0–52.0)
Hemoglobin: 9.2 g/dL — ABNORMAL LOW (ref 13.0–17.0)
MCH: 20.9 pg — ABNORMAL LOW (ref 26.0–34.0)
MCHC: 31.8 g/dL (ref 30.0–36.0)
MCV: 65.5 fL — ABNORMAL LOW (ref 80.0–100.0)
Platelets: 256 10*3/uL (ref 150–400)
RBC: 4.41 MIL/uL (ref 4.22–5.81)
RDW: 18.5 % — ABNORMAL HIGH (ref 11.5–15.5)
WBC: 9.4 10*3/uL (ref 4.0–10.5)
nRBC: 0 % (ref 0.0–0.2)

## 2022-10-30 LAB — BASIC METABOLIC PANEL
Anion gap: 8 (ref 5–15)
BUN: 12 mg/dL (ref 8–23)
CO2: 26 mmol/L (ref 22–32)
Calcium: 8.3 mg/dL — ABNORMAL LOW (ref 8.9–10.3)
Chloride: 99 mmol/L (ref 98–111)
Creatinine, Ser: 0.93 mg/dL (ref 0.61–1.24)
GFR, Estimated: >60 mL/min (ref >60)
Glucose, Bld: 122 mg/dL — ABNORMAL HIGH (ref 70–99)
Potassium: 3.9 mmol/L (ref 3.5–5.1)
Sodium: 133 mmol/L — ABNORMAL LOW (ref 135–145)

## 2022-10-30 LAB — GLUCOSE, CAPILLARY
Glucose-Capillary: 96 mg/dL (ref 70–99)
Glucose-Capillary: 131 mg/dL — ABNORMAL HIGH (ref 70–99)
Glucose-Capillary: 138 mg/dL — ABNORMAL HIGH (ref 70–99)
Glucose-Capillary: 137 mg/dL — ABNORMAL HIGH (ref 70–99)
Glucose-Capillary: 153 mg/dL — ABNORMAL HIGH (ref 70–99)
Glucose-Capillary: 155 mg/dL — ABNORMAL HIGH (ref 70–99)

## 2022-10-30 LAB — TYPE AND SCREEN
ABO/RH(D): A POS
Antibody Screen: NEGATIVE
Unit division: 0

## 2022-10-30 LAB — IRON AND TIBC
Iron: 32 ug/dL — ABNORMAL LOW (ref 45–182)
Saturation Ratios: 19 % (ref 17.9–39.5)
TIBC: 165 ug/dL — ABNORMAL LOW (ref 250–450)
UIBC: 133 ug/dL

## 2022-10-30 LAB — BPAM RBC
Blood Product Expiration Date: 202402032359
ISSUE DATE / TIME: 202401201302
Unit Type and Rh: 6200

## 2022-10-30 LAB — FERRITIN: Ferritin: 652 ng/mL — ABNORMAL HIGH (ref 24–336)

## 2022-10-30 LAB — MAGNESIUM: Magnesium: 2 mg/dL (ref 1.7–2.4)

## 2022-10-30 MED ORDER — SODIUM CHLORIDE 0.9 % IV SOLN: INTRAVENOUS | Status: DC | PRN

## 2022-10-30 NOTE — Progress Notes (Signed)
Ate almost 100% of breakfast with no nausea or abd pain.  Did request dilaudid for chronic back pain   Wife at bedside

## 2022-10-30 NOTE — TOC Progression Note (Signed)
Transition of Care [facility]) - Progression Note    Patient Details  Name: Joshua Osborne MRN: [id number] Date of Birth: [birth year]  Transition [facility]) CM/SW Contact  Salome Arnt,  Phone Number: 10/30/2022, 12:47 PM  Clinical Narrative: LCSW received call from [facility] that referral was received and pt has been accepted for home hospice. TOC will call [phone] to notify of d/c.       Expected Discharge Plan: Home/Self Care Barriers to Discharge: Continued Medical Work up  Expected Discharge Plan and Services In-house Referral: Clinical Social Work Discharge Planning Services: CM Consult   Living arrangements for the past 2 months: Single Family Home                                       Social Determinants of Health (SDOH) Interventions SDOH Screenings   Food Insecurity: No Food Insecurity (10/27/2022)  Housing: Low Risk  (10/27/2022)  Transportation Needs: No Transportation Needs (10/27/2022)  Utilities: Not At Risk (10/27/2022)  Tobacco Use: Medium Risk (10/26/2022)    Readmission Risk Interventions    10/27/2022    3:30 PM 10/03/2022   10:56 AM  Readmission Risk Prevention Plan  Transportation Screening Complete Complete  HRI or [place] Complete   Social Work Consult for [place] Planning/Counseling Complete   Palliative Care Screening Not Applicable   Medication Review Press photographer) Complete Complete  HRI or [place]  Complete  SW Recovery Care/Counseling Consult  Complete  [place]  Not Applicable

## 2022-10-30 NOTE — Progress Notes (Signed)
OT Cancellation Note  Patient Details Name: Joshua Osborne MRN: [id number] DOB: 09-12-1941   Cancelled Treatment:    Reason Eval/Treat Not Completed: OT screened, no needs identified, will sign off. Pt is able to ambulate with no assist in the room with RW. He reports at his baseline and has support at home if needed. No OT concerns at this time and OT will sign off. Please reconsult if needs arise/change.  Paulita Fujita, OTR/L [facility] Acute Rehab [place] Yolanda Bonine 10/30/2022, 11:02 AM

## 2022-10-30 NOTE — Progress Notes (Signed)
Referral placed for Hospice with [facility] per patient and Dr. Tomie China request.  Future appointments cancelled.

## 2022-10-30 NOTE — Progress Notes (Signed)
[facility] Progress Note     Subjective: Patient seen and examined.  He is resting comfortably in bed.  He is ready to have some more solid food, awaiting his breakfast.  He denies nausea and vomiting.  His last bowel movement was overnight.  He confirms passing flatus.  Denies significant abdominal pain.  Objective: Vital signs in last 24 hours: Temp:  [97.7 F (36.5 C)-98.5 F (36.9 C)] 98.5 F (36.9 C) (01/22 0524) Pulse Rate:  [69-75] 70 (01/22 0524) Resp:  [16-17] 16 (01/22 0500) BP: (145-154)/(59-74) 154/74 (01/22 0524) SpO2:  [97 %-98 %] 98 % (01/22 0524) Last BM Date : 10/29/22  Intake/Output from previous day: 01/21 0701 - 01/22 0700 In: 1964.9 [P.O.:450; I.V.:830.8; IV Piggyback:684.1] Out: 2265 [Urine:2265] Intake/Output this shift: No intake/output data recorded.  General appearance: alert, cooperative, and no distress GI: Abdomen soft, nondistended, no percussion tenderness, nontender to palpation; no rigidity, guarding, rebound tenderness  Lab Results:  Recent Labs    10/29/22 0441 10/30/22 0537  WBC 13.6* 9.4  HGB 8.8* 9.2*  HCT 27.7* 28.9*  PLT 249 256   BMET Recent Labs    10/29/22 0441 10/30/22 0537  NA 132* 133*  K 3.7 3.9  CL 96* 99  CO2 26 26  GLUCOSE 77 122*  BUN 14 12  CREATININE 0.98 0.93  CALCIUM 8.4* 8.3*   PT/INR No results for input(s): "LABPROT", "INR" in the last 72 hours.  Studies/Results: DG Abd Portable 1V-Small Bowel Obstruction Protocol-initial, 8 hr delay  Result Date: 10/28/2022 CLINICAL DATA:  Small-bowel obstruction 8 hour follow-up EXAM: PORTABLE ABDOMEN - 1 VIEW COMPARISON:  Abdominal x-ray 10/28/2022 FINDINGS: Enteric tube tip is now at the level of the mid esophagus. No dilated bowel loops are seen. Oral contrast is now seen throughout the ascending colon and transverse colon. Right upper quadrant surgical clips are again seen. Lung bases are clear. No acute fractures. IMPRESSION: 1. No evidence  for bowel obstruction. 2. Enteric tube tip is at the level of the mid esophagus. Recommend repositioning. Electronically Signed   By: Ronney Asters M.D.   On: 10/28/2022 21:51   DG Abd Portable 1V-Small Bowel Protocol-Position Verification  Result Date: 10/28/2022 CLINICAL DATA:  NG tube placement. EXAM: PORTABLE ABDOMEN - 1 VIEW COMPARISON:  10/26/2022 and CT 10/26/2022 FINDINGS: Nasogastric tube present coursing into the stomach as tip is not visualized due to obscuration by contrast. Contrast is seen filling the visualized portion of the nasogastric tube with contrast over the stomach, duodenum and proximal jejunum. Interval improvement in the previously seen air-filled mildly dilated small bowel loops. Air seen throughout the colon. No free peritoneal air. Remainder of the exam is unchanged. IMPRESSION: Interval improvement in the previously seen air-filled mildly dilated small bowel loops. Nasogastric tube present with contrast seen within the tube as well as the stomach and normal appearing duodenum and proximal jejunal loops. Electronically Signed   By: Marin Olp M.D.   On: 10/28/2022 14:17    Anti-infectives: Anti-infectives (From admission, onward)    Start     Dose/Rate Route Frequency Ordered Stop   10/27/22 1500  meropenem (MERREM) 1 g in sodium chloride 0.9 % 100 mL IVPB        1 g 200 mL/hr over 30 Minutes Intravenous Every 8 hours 10/27/22 1317     10/26/22 1500  meropenem (MERREM) 1 g in sodium chloride 0.9 % 100 mL IVPB  Status:  Discontinued        1 g  200 mL/hr over 30 Minutes Intravenous Every 12 hours 10/26/22 1437 10/27/22 1317   10/26/22 1330  cefTRIAXone (ROCEPHIN) 1 g in sodium chloride 0.9 % 100 mL IVPB        1 g 200 mL/hr over 30 Minutes Intravenous  Once 10/26/22 1323 10/26/22 1413       Assessment/Plan:  Patient is an 82 year old male who is admitted with small bowel obstruction.  -Patient moving his bowels and pain has resolved -Patient tolerating full  liquids, advance to GI soft -Fluids per primary team -PRN pain control and antiemetics -Antibiotics per primary team, for UTI -No need for surgical intervention at this time -Care per primary team   LOS: 4 days    [place] 10/30/2022

## 2022-10-30 NOTE — Progress Notes (Signed)
PROGRESS NOTE    Joshua Osborne  TMH:[id number] DOB: [birth year] DOA: 10/26/2022 PCP: Earney Mallet, MD   Brief Narrative:    Joshua Osborne is a 82 y.o. 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  Admitted with recurrent SBO as well as concern for sepsis, present on admission secondary to UTI, started on Merrem empirically.  Hospital course complicated by high NG tube output, 2 L on 10/28/2022.  After 7 bowel movements on 10/29/2022, NG tube was removed and the patient was started on a clear liquid diet at the recommendation of general surgery.  10/30/2022: The patient was seen and examined at his bedside.  His wife is present in the room.  Admits to flatulence.  Started on soft diet.    Assessment & Plan:   Principal Problem:   SBO (small bowel obstruction) (HCC) Active Problems:   Transitional cell carcinoma of renal pelvis, left (HCC)   Acute urinary retention   Transitional cell bladder cancer (HCC)   Controlled type 2 diabetes mellitus without complication, without long-term current use of insulin (HCC)   Mixed hyperlipidemia   Hypomagnesemia   Urothelial carcinoma of kidney, left (HCC)   Chronic kidney disease, stage 3b (HCC)   GERD (gastroesophageal reflux disease)   Low back pain   Acute cystitis with hematuria  Assessment and Plan:  Resolved recurrent SBO. Optimize magnesium and potassium level, magnesium greater than 2.0, potassium greater than 4.0. Mobilize as tolerated IV antiemetics as needed, IV analgesics as needed CT abdomen with concern for peritoneal carcinomatosis NG tube removed on 10/29/22  after 7 bowel movements. Tolerated clear liquid diet on 10/29/2022. Started on soft diet on 10/30/2022. Electrolytes are stable, tolerating oral intake, DC IV fluid.   Resolving severe sepsis secondary to UTI Prior culture with Klebsiella ESBL, was started on Merrem, continue, complete 5 days.  Day number 4 out of 5 Urine culture multiple species present however with immunosuppression and bladder cancer we will treat. Blood cultures no growth to date, continue to monitor until finalized.. 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 Delton Coombes and is on immunotherapy Appreciate oncology evaluation as requested   Resolved neutrophilic leukocytosis WBC has normalized.  Afebrile. Monitor fever curve and WBC   Acute urinary retention with BPH Currently without Foley catheter Continue Rapaflo Appreciate urology evaluation as requested   Resolved AKI Baseline creatinine 0.9 with GFR greater than 60 Presented with creatinine of 1.3 with GFR 52. Continue to for nephrotoxic agents, dehydration and hypotension. Continue to monitor urine output   Chronic microcytic anemia With iron deficiency and Feraheme previously given Received 1 unit PRBCs on 10/28/2022 for hemoglobin of 7.5. Hemoglobin stable at 8.8 posttransfusion. Hemoglobin uptrending this morning 9.2 from 8.8.   Mixed dyslipidemia Continue statin   Chronic back pain Analgesics as needed  Prediabetes, well-controlled Recent hemoglobin A1c 5.3% Previous A1c 5.9 on 04/21/2022 On metformin prior to admission  Resolved post repletion: Hypokalemia Potassium 3.4>> 3.7>> 3.9 Goal potassium 4.0 Repleted intravenously.  Hypomagnesemia Magnesium 1.6>> 2.0 Repleted intravenously.  Hypovolemic hyponatremia, improving Serum sodium 132>> 133 IV fluid hydration, DC'd on 10/30/2022. Repeat  BMP in the morning  Physical  debility Assessment by PT with no further recommendations. Continue fall precaution Continue to mobilize as tolerated    DVT prophylaxis: Lovenox subcu Lovenox daily Code Status: Full Family Communication: Updated his son and wife at bedside on 10/28/2022, 10/29/2022.  Updated his wife at bedside on 10/30/2022. Disposition Plan: Continue IV hydration and dietary advancement per general surgery recommendations Status is: Inpatient Remains inpatient appropriate because: Need for IV fluid   Consultants:  General surgery Urology Oncology  Procedures:  NG tube placement 1/18  Antimicrobials:  Anti-infectives (From admission, onward)    Start     Dose/Rate Route Frequency Ordered Stop   10/27/22 1500  meropenem (MERREM) 1 g in sodium chloride 0.9 % 100 mL IVPB        1 g 200 mL/hr over 30 Minutes Intravenous Every 8 hours 10/27/22 1317     10/26/22 1500  meropenem (MERREM) 1 g in sodium chloride 0.9 % 100 mL IVPB  Status:  Discontinued        1 g 200 mL/hr over 30 Minutes Intravenous Every 12 hours 10/26/22 1437 10/27/22 1317   10/26/22 1330  cefTRIAXone (ROCEPHIN) 1 g in sodium chloride 0.9 % 100 mL IVPB        1 g 200 mL/hr over 30 Minutes Intravenous  Once 10/26/22 1323 10/26/22 1413        Objective: Vitals:   10/29/22 2259 10/30/22 0500 10/30/22 0524 10/30/22 1533  BP: (!) 146/66 (!) 154/74 (!) 154/74 115/66  Pulse: 69 74 70 74  Resp:  16  17  Temp: 98.3 F (36.8 C) 98.5 F (36.9 C) 98.5 F (36.9 C) 97.8 F (36.6 C)  TempSrc: Oral Oral Oral Oral  SpO2: 98% 97% 98% 97%  Weight:      Height:        Intake/Output Summary (Last 24 hours) at 10/30/2022 1719 Last data filed at 10/30/2022 1511 Gross per 24 hour  Intake 1299.6 ml  Output 1975 ml  Net -675.4 ml   Filed Weights   10/26/22 0850 10/26/22 1557 10/27/22 0600  Weight: 68.9 kg 73.2 kg 75.6 kg    Examination:  General exam: Pleasant well-developed well-nourished in no distress.  He is alert oriented x  3.   Respiratory system: Clear to auscultation with no wheezes or rales.   Cardiovascular system: Regular rate and rhythm no rubs or gallops.   Gastrointestinal system: NG tube removed. Central nervous system: Alert and awake. Extremities: Lower extremity edema bilaterally. Skin: No significant lesions noted. Psychiatry: Mood is appropriate for condition and setting.     Data Reviewed: I have personally reviewed following labs and imaging studies  CBC: Recent Labs  Lab 10/26/22 0911 10/27/22 0625 10/28/22 0442 10/29/22 0441 10/30/22 0537  WBC 24.7* 16.0* 14.4* 13.6* 9.4  NEUTROABS 21.7*  --   --   --   --   HGB 11.4* 8.2* 7.5* 8.8* 9.2*  HCT 36.1* 26.7* 23.9* 27.7* 28.9*  MCV 63.7* 64.5* 63.9* 66.0* 65.5*  PLT 402* 287 268 249 102   Basic Metabolic Panel: Recent Labs  Lab 10/26/22 0911 10/27/22 0442 10/28/22 0442 10/29/22 0441 10/30/22 0537  NA 137 137 136 132* 133*  K 4.9 4.2 3.4* 3.7 3.9  CL 97* 103 100 96* 99  CO2 '26 28 29 26 26  '$ GLUCOSE 199* 109* 87 77 122*  BUN '21 18 15 14 12  '$ CREATININE 1.38* 1.13 1.00 0.98 0.93  CALCIUM 9.7 8.1* 8.4* 8.4* 8.3*  MG 1.6* 1.8 1.7 1.6* 2.0  PHOS  --   --   --  2.5  --    GFR: Estimated Creatinine Clearance: 67.7 mL/min (by C-G formula based on SCr of 0.93 mg/dL). Liver Function Tests: Recent Labs  Lab 10/26/22 0911 10/27/22 0442  AST 21 12*  ALT 19 11  ALKPHOS 78 52  BILITOT 0.9 0.4  PROT 7.8 5.2*  ALBUMIN 4.0 2.6*   Recent Labs  Lab 10/26/22 0911  LIPASE 31   No results for input(s): "AMMONIA" in the last 168 hours. Coagulation Profile: Recent Labs  Lab 10/26/22 1336  INR 1.0   Cardiac Enzymes: No results for input(s): "CKTOTAL", "CKMB", "CKMBINDEX", "TROPONINI" in the last 168 hours. BNP (last 3 results) No results for input(s): "PROBNP" in the last 8760 hours. HbA1C: No results for input(s): "HGBA1C" in the last 72 hours. CBG: Recent Labs  Lab 10/29/22 2123 10/30/22 0523 10/30/22 0732  10/30/22 1234 10/30/22 1713  GLUCAP 123* 131* 138* 137* 153*   Lipid Profile: No results for input(s): "CHOL", "HDL", "LDLCALC", "TRIG", "CHOLHDL", "LDLDIRECT" in the last 72 hours. Thyroid Function Tests: No results for input(s): "TSH", "T4TOTAL", "FREET4", "T3FREE", "THYROIDAB" in the last 72 hours. Anemia Panel: Recent Labs    10/30/22 0537  FERRITIN 652*  TIBC 165*  IRON 32*   Sepsis Labs: Recent Labs  Lab 10/26/22 0911 10/26/22 1146 10/27/22 0442  LATICACIDVEN 3.2* 3.0* 0.7    Recent Results (from the past 240 hour(s))  Urine Culture     Status: Abnormal   Collection Time: 10/26/22  1:28 PM   Specimen: Urine, Clean Catch  Result Value Ref Range Status   Specimen Description   Final    URINE, CLEAN CATCH Performed at [facility], [address], [place], [place] [address]    Special Requests   Final    NONE Performed at [facility], [address] [place], [place] [address]    [place], SUGGEST RECOLLECTION (A)  Final   Report Status 10/27/2022 FINAL  Final  Blood culture (routine x 2)     Status: None (Preliminary result)   Collection Time: 10/26/22  1:36 PM   Specimen: BLOOD  Result Value Ref Range Status   Specimen Description BLOOD LEFT ANTECUBITAL  Final   Special Requests   Final    BOTTLES DRAWN AEROBIC AND ANAEROBIC Blood Culture adequate volume   Culture   Final    NO GROWTH 4 DAYS Performed at [facility], [address] [place], [place] [address]    Report Status PENDING  Incomplete  Blood culture (routine x 2)     Status: None (Preliminary result)   Collection Time: 10/26/22  1:36 PM   Specimen: BLOOD  Result Value Ref Range Status   Specimen Description BLOOD RIGHT ANTECUBITAL  Final   Special Requests   Final    BOTTLES DRAWN AEROBIC AND ANAEROBIC Blood Culture adequate volume   Culture   Final    NO GROWTH 4 DAYS Performed at [facility], [address], [place],  [address]    Report Status PENDING   Incomplete  MRSA Next Gen by PCR, Nasal     Status: None   Collection Time: 10/26/22  4:01 PM   Specimen: Nasal Mucosa; Nasal Swab  Result Value Ref Range Status   MRSA by PCR Next Gen NOT DETECTED NOT DETECTED Final    Comment: (NOTE) The GeneXpert MRSA Assay (FDA approved for NASAL specimens only), is one component  of a comprehensive MRSA colonization surveillance program. It is not intended to diagnose MRSA infection nor to guide or monitor treatment for MRSA infections. Test performance is not FDA approved in patients less than 63 years old. Performed at [facility], [address], [place], [place] [address]          Radiology Studies: DG Abd Portable 1V-Small Bowel Obstruction Protocol-initial, 8 hr delay  Result Date: 10/28/2022 CLINICAL DATA:  Small-bowel obstruction 8 hour follow-up EXAM: PORTABLE ABDOMEN - 1 VIEW COMPARISON:  Abdominal x-ray 10/28/2022 FINDINGS: Enteric tube tip is now at the level of the mid esophagus. No dilated bowel loops are seen. Oral contrast is now seen throughout the ascending colon and transverse colon. Right upper quadrant surgical clips are again seen. Lung bases are clear. No acute fractures. IMPRESSION: 1. No evidence for bowel obstruction. 2. Enteric tube tip is at the level of the mid esophagus. Recommend repositioning. Electronically Signed   By: Ronney Asters M.D.   On: 10/28/2022 21:51        Scheduled Meds:  Chlorhexidine Gluconate Cloth  6 each Topical Q0600   citalopram  20 mg Oral Daily   enoxaparin (LOVENOX) injection  40 mg Subcutaneous Q24H   latanoprost  1 drop Both Eyes QHS   predniSONE  5 mg Oral Q breakfast   simvastatin  40 mg Oral QHS   tamsulosin  0.4 mg Oral QPC breakfast   timolol  1 drop Both Eyes Daily   Continuous Infusions:  sodium chloride 10 mL/hr at 10/30/22 1414   meropenem (MERREM) IV 1 g (10/30/22 1416)   methocarbamol (ROBAXIN) IV 500 mg (10/30/22 1511)     LOS: 4 days    Time spent: 35  minutes    Kayleen Memos, DO [facility]  If 7PM-7AM, please contact night-coverage [email or web address] 10/30/2022, 5:19 PM

## 2022-10-31 DIAGNOSIS — K56609 Unspecified intestinal obstruction, unspecified as to partial versus complete obstruction: Secondary | ICD-10-CM | POA: Diagnosis not present

## 2022-10-31 LAB — CBC
HCT: 29.3 % — ABNORMAL LOW (ref 39.0–52.0)
Hemoglobin: 9.4 g/dL — ABNORMAL LOW (ref 13.0–17.0)
MCH: 21.2 pg — ABNORMAL LOW (ref 26.0–34.0)
MCHC: 32.1 g/dL (ref 30.0–36.0)
MCV: 66 fL — ABNORMAL LOW (ref 80.0–100.0)
Platelets: 264 10*3/uL (ref 150–400)
RBC: 4.44 MIL/uL (ref 4.22–5.81)
RDW: 18.7 % — ABNORMAL HIGH (ref 11.5–15.5)
WBC: 11.4 10*3/uL — ABNORMAL HIGH (ref 4.0–10.5)
nRBC: 0 % (ref 0.0–0.2)

## 2022-10-31 LAB — GLUCOSE, CAPILLARY
Glucose-Capillary: 134 mg/dL — ABNORMAL HIGH (ref 70–99)
Glucose-Capillary: 145 mg/dL — ABNORMAL HIGH (ref 70–99)
Glucose-Capillary: 160 mg/dL — ABNORMAL HIGH (ref 70–99)
Glucose-Capillary: 177 mg/dL — ABNORMAL HIGH (ref 70–99)

## 2022-10-31 LAB — BASIC METABOLIC PANEL
Anion gap: 7 (ref 5–15)
BUN: 15 mg/dL (ref 8–23)
CO2: 26 mmol/L (ref 22–32)
Calcium: 8.3 mg/dL — ABNORMAL LOW (ref 8.9–10.3)
Chloride: 100 mmol/L (ref 98–111)
Creatinine, Ser: 0.94 mg/dL (ref 0.61–1.24)
GFR, Estimated: >60 mL/min (ref >60)
Glucose, Bld: 152 mg/dL — ABNORMAL HIGH (ref 70–99)
Potassium: 3.9 mmol/L (ref 3.5–5.1)
Sodium: 133 mmol/L — ABNORMAL LOW (ref 135–145)

## 2022-10-31 MED ORDER — HEPARIN SOD (PORK) LOCK FLUSH 100 UNIT/ML IV SOLN
500.0000 [IU] | Freq: Once | INTRAVENOUS | Status: DC
  Filled 2022-10-31: qty 5

## 2022-10-31 NOTE — Care Management Important Message (Signed)
Important Message  Patient Details  Name: Joshua Osborne MRN: [id number] Date of Birth: 1941-08-30   Medicare Important Message Given:  Yes     Tommy Medal 10/31/2022, 10:40 AM

## 2022-10-31 NOTE — Discharge Summary (Signed)
Physician Discharge Summary  Joshua Osborne TKP:[id number] DOB: 11-Jan-1941 DOA: 10/26/2022  PCP: Earney Mallet, MD  Admit date: 10/26/2022  Discharge date: 10/31/2022  Admitted From:Home  Disposition:  Home with hospice  Recommendations for Outpatient Follow-up:  Follow up with home hospice agency Remain on medications as noted  Home Health: With hospice care  Equipment/Devices: Hospital bed  Discharge Condition:Stable  CODE STATUS: Full  Diet recommendation: Heart Healthy/carb modified  Brief/Interim Summary:  Joshua Osborne is a 82 y.o. 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  Admitted with recurrent SBO as well as concern for sepsis, present on admission secondary to UTI, started on Merrem empirically and urine cultures had resulted with multiple species.  He has completed 5-day course of Merrem at this point and is now tolerating diet and having bowel movements and passing flatus.  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  His oncologist, Dr. Delton Coombes had spoken to him about transitioning to home hospice care and he is now agreeable and he will no longer receive any further chemotherapy/immunotherapy for his condition.  He has been seen by general surgery and is in stable condition for discharge today and home hospice has been set up for him.  Discharge Diagnoses:  Principal Problem:   SBO (small bowel obstruction) (HCC) Active Problems:   Transitional cell carcinoma of renal pelvis, left  (HCC)   Acute urinary retention   Transitional cell bladder cancer (HCC)   Controlled type 2 diabetes mellitus without complication, without long-term current use of insulin (HCC)   Mixed hyperlipidemia   Hypomagnesemia   Urothelial carcinoma of kidney, left (HCC)   Chronic kidney disease, stage 3b (HCC)   GERD (gastroesophageal reflux disease)   Low back pain   Acute cystitis with hematuria  Principal discharge diagnosis: Recurrent SBO in the setting of carcinomatosis with transitional cell carcinoma with metastatic disease along with severe sepsis, present on admission, secondary to UTI concerning for ESBL organism.  Discharge Instructions  Discharge Instructions     Diet - low sodium heart healthy   Complete by: As directed    Increase activity slowly   Complete by: As directed       Allergies as of 10/31/2022   No Known Allergies      Medication List     STOP taking these medications    KEYTRUDA IV   PADCEV IV       TAKE these medications    acetaminophen 325 MG tablet Commonly known as: TYLENOL Take 2 tablets (650 mg total) by mouth every 6 (six) hours as needed for mild pain (or Fever >/= 101).   citalopram 20 MG tablet Commonly known as: CELEXA Take 20 mg by mouth in the morning.   fluorouracil 5 % cream Commonly known as: EFUDEX Apply 1 Application topically 2 (two) times daily as needed (cancer spots).   latanoprost 0.005 % ophthalmic solution Commonly known as: XALATAN Place 1 drop into both eyes at bedtime.   lidocaine-prilocaine cream Commonly known as: EMLA Apply a small amount to port a cath site  and cover with plastic wrap 1 hour prior to infusion What changed:  how much to take how to take this when to take this reasons to take this additional instructions   metFORMIN 500 MG tablet Commonly known as: GLUCOPHAGE Take 500 mg by mouth 2 (two) times daily.   metoCLOPramide 10 MG tablet Commonly known as: REGLAN Take 10 mg by mouth  every 6 (six) hours as needed for nausea.   ondansetron 8 MG disintegrating tablet Commonly known as: ZOFRAN-ODT Take 1 tablet (8 mg total) by mouth every 8 (eight) hours as needed for nausea or vomiting.   Oxycodone HCl 10 MG Tabs Take 1 tablet (10 mg total) by mouth every 6 (six) hours as needed.   predniSONE 10 MG tablet Commonly known as: DELTASONE TAKE 1 TABLET (10 MG TOTAL) BY MOUTH DAILY WITH BREAKFAST. What changed: how much to take   prochlorperazine 10 MG tablet Commonly known as: COMPAZINE Take 1 tablet (10 mg total) by mouth every 6 (six) hours as needed for nausea or vomiting.   promethazine 25 MG suppository Commonly known as: PHENERGAN Place 1 suppository (25 mg total) rectally every 6 (six) hours as needed for nausea or vomiting.   senna-docusate 8.6-50 MG tablet Commonly known as: Senokot-S Take 2 tablets by mouth 2 (two) times daily.   silodosin 8 MG Caps capsule Commonly known as: RAPAFLO Take 1 capsule (8 mg total) by mouth daily with breakfast.   simvastatin 40 MG tablet Commonly known as: ZOCOR Take 40 mg by mouth at bedtime.   timolol 0.5 % ophthalmic solution Commonly known as: BETIMOL Place 1 drop into both eyes in the morning.   tiZANidine 2 MG tablet Commonly known as: ZANAFLEX Take 2 mg by mouth at bedtime.        Follow-up Information     Earney Mallet, MD. Schedule an appointment as soon as possible for a visit in 1 week(s).   Specialty: Family Medicine Contact information: [address] [place] [address] [phone]                No Known Allergies  Consultations: General surgery Urology Oncology   Procedures/Studies: DG Abd Portable 1V-Small Bowel Obstruction Protocol-initial, 8 hr delay  Result Date: 10/28/2022 CLINICAL DATA:  Small-bowel obstruction 8 hour follow-up EXAM: PORTABLE ABDOMEN - 1 VIEW COMPARISON:  Abdominal x-ray 10/28/2022 FINDINGS: Enteric tube tip is now at the level of the mid esophagus.  No dilated bowel loops are seen. Oral contrast is now seen throughout the ascending colon and transverse colon. Right upper quadrant surgical clips are again seen. Lung bases are clear. No acute fractures. IMPRESSION: 1. No evidence for bowel obstruction. 2. Enteric tube tip is at the level of the mid esophagus. Recommend repositioning. Electronically Signed   By: Ronney Asters M.D.   On: 10/28/2022 21:51   DG Abd Portable 1V-Small Bowel Protocol-Position Verification  Result Date: 10/28/2022 CLINICAL DATA:  NG tube placement. EXAM: PORTABLE ABDOMEN - 1 VIEW COMPARISON:  10/26/2022 and CT 10/26/2022 FINDINGS: Nasogastric tube present coursing into the stomach as tip is not visualized due to obscuration by contrast. Contrast is seen filling the visualized portion of the nasogastric tube with contrast over the stomach, duodenum and proximal jejunum. Interval improvement in the previously seen air-filled mildly dilated small bowel loops. Air seen throughout the colon. No free peritoneal air. Remainder of the exam is unchanged. IMPRESSION: Interval improvement in the previously seen air-filled mildly dilated small bowel loops. Nasogastric tube present with contrast  seen within the tube as well as the stomach and normal appearing duodenum and proximal jejunal loops. Electronically Signed   By: Marin Olp M.D.   On: 10/28/2022 14:17   DG Abd Portable 1V  Result Date: 10/26/2022 CLINICAL DATA:  [id number] Encounter for nasogastric (NG) tube placement [id number] EXAM: PORTABLE ABDOMEN - 1 VIEW COMPARISON:  October 04, 2023; October 26, 2022 FINDINGS: Enteric tube tip and side port project over the proximal stomach. Status post cholecystectomy. Revisualization of multiple dilated loops of small bowel, better assessed on same day CT. Bibasilar pulmonary nodules. IMPRESSION: Enteric tube tip and side port project over the proximal stomach. Electronically Signed   By: Valentino Saxon M.D.   On: 10/26/2022 16:32   CT  ABDOMEN PELVIS W CONTRAST  Result Date: 10/26/2022 CLINICAL DATA:  Abdominal pain; history of bladder cancer; * Tracking Code: BO * EXAM: CT ABDOMEN AND PELVIS WITH CONTRAST TECHNIQUE: Multidetector CT imaging of the abdomen and pelvis was performed using the standard protocol following bolus administration of intravenous contrast. RADIATION DOSE REDUCTION: This exam was performed according to the departmental dose-optimization program which includes automated exposure control, adjustment of the mA and/or kV according to patient size and/or use of iterative reconstruction technique. CONTRAST:  14m OMNIPAQUE IOHEXOL 300 MG/ML  SOLN COMPARISON:  CT abdomen and pelvis dated October 01, 2022 FINDINGS: Lower chest: Solid of the bilateral lower lobes are increased in size. Reference solid pulmonary nodule measuring 7 mm on series 4, image 9, previously 3 mm. Hepatobiliary: Irregular partially calcified low-attenuation liver lesions, similar to prior exam, lack of IV contrast on prior limits evaluation. No new liver lesions. Gallbladder is surgically absent. Mild prominence of the bile ducts is unchanged when compared with prior and not unexpected status post cholecystectomy. Pancreas: Unremarkable. No pancreatic ductal dilatation or surrounding inflammatory changes. Spleen: Normal in size without focal abnormality. Adrenals/Urinary Tract: Prior left nephrectomy and adrenalectomy. Normal appearance of the right kidney with no evidence of hydronephrosis or nephrolithiasis. Normal right adrenal gland. Bladder is unremarkable. Stomach/Bowel: Numerous dilated loops of distal small bowel with transition point seen in the right hemiabdomen on series 3, image 35. Normal appendix. Diverticulosis. No evidence of bowel wall thickening or pneumatosis. Vascular/Lymphatic: Aortic atherosclerosis. Enlarged left retroperitoneal lymph nodes, increased in size when compared with prior exam. Reference node measuring 1.5 cm on series 3,  image 40, previously 1.2 cm in short axis. Reproductive: Unchanged prostatomegaly. Other: Trace abdominal ascites, increased when compared with prior exam. New subtle peritoneal nodularity seen adjacent to the spleen on series 3, image 20. No intra-abdominal free air Musculoskeletal: No acute or significant osseous findings. IMPRESSION: 1. Numerous dilated loops of small bowel with transition point seen in the right hemiabdomen, findings are compatible with small bowel obstruction. 2. Trace abdominal ascites, increased when compared with prior exam. New subtle peritoneal nodularity seen adjacent to the spleen, concerning for peritoneal carcinomatosis, a component of the ascites may also be related to small bowel obstruction. 3. Increased size of left retroperitoneal lymph nodes and increased size of solid nodules of the bilateral lower lobes, consistent with worsened metastatic disease. 4. Irregular partially calcified low-attenuation liver lesions, similar to prior exam, lack of IV contrast on prior limits evaluation. 5. Aortic Atherosclerosis (ICD10-I70.0). Electronically Signed   By: LYetta GlassmanM.D.   On: 10/26/2022 12:16   DG Abd Portable 1V-Small Bowel Obstruction Protocol-initial, 8 hr delay  Result Date: 10/03/2022 CLINICAL DATA:  8 hour small-bowel follow-up film EXAM: PORTABLE ABDOMEN - 1  VIEW COMPARISON:  10/03/2022 FINDINGS: Administered contrast now lies throughout the small bowel as well as within the colon. Mild small bowel prominence remains. These findings are consistent with partial small bowel obstruction. The degree of small-bowel dilatation has improved slightly in the interval from prior CT. No free air is noted. No acute bony abnormality is seen. IMPRESSION: Administered contrast lies within the small bowel and colon consistent with partial small bowel obstruction. The degree of small-bowel dilatation has improved in the interval from 10/01/2022 Electronically Signed   By: Inez Catalina M.D.   On: 10/03/2022 23:19   DG Abd Portable 1V-Small Bowel Protocol-Position Verification  Result Date: 10/03/2022 CLINICAL DATA:  Nasogastric placement EXAM: PORTABLE ABDOMEN - 1 VIEW COMPARISON:  10/01/2022 FINDINGS: Nasogastric tube tip is in the distal esophagus just above the diaphragm . IMPRESSION: Nasogastric tube tip in the distal esophagus just above the diaphragm. Electronically Signed   By: Nelson Chimes M.D.   On: 10/03/2022 13:41   DG Cervical Spine 2 or 3 views  Result Date: 10/03/2022 CLINICAL DATA:  Neck pain EXAM: CERVICAL SPINE - 2-3 VIEW COMPARISON:  None Available. FINDINGS: There is no evidence of cervical spine fracture or prevertebral soft tissue swelling. Alignment is normal. Mild degenerative disc disease at C6-7. Multilevel facet arthropathy, most advanced at the C2-3 level. Partially imaged nasogastric tube and right IJ central line. Carotid atherosclerosis. 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 No acute findings. Electronically Signed   By: Davina Poke D.O.   On: 10/03/2022 10:58   DG Abd Portable 1 View  Result Date: 10/01/2022 CLINICAL DATA:  Evaluate NG placement EXAM: PORTABLE ABDOMEN - 1 VIEW COMPARISON:  Earlier today FINDINGS: There is a enteric tube with tip and side port below the GE junction within the gastric fundus. Mildly dilated loops of small bowel are noted in the left hemiabdomen. IMPRESSION: Enteric tube tip and side port below the GE junction within the gastric fundus. Electronically Signed   By: Kerby Moors M.D.   On: 10/01/2022 13:58   DG Abd Portable 1 View  Result Date: 10/01/2022 CLINICAL DATA:  NG tube placement. EXAM: PORTABLE ABDOMEN - 1 VIEW COMPARISON:  None Available. FINDINGS: NG tube tip is in the mid stomach with proximal side port at or just below the GE junction. Mild gaseous small bowel distension noted in the left abdomen. IMPRESSION: NG tube tip is in the mid stomach with proximal side port  at or just below the GE junction. NG tube could be advanced approximately 3 cm to ensure side port is below the GE junction. Electronically Signed   By: Misty Stanley M.D.   On: 10/01/2022 13:10     Discharge Exam: Vitals:   10/30/22 2146 10/31/22 0621  BP: 125/73 (!) 141/77  Pulse: 78 94  Resp: 16 16  Temp: 98.6 F (37 C) 99.1 F (37.3 C)  SpO2: 97% 97%   Vitals:   10/30/22 0524 10/30/22 1533 10/30/22 2146 10/31/22 0621  BP: (!) 154/74 115/66 125/73 (!) 141/77  Pulse: 70 74 78 94  Resp:  '17 16 16  '$ Temp: 98.5 F (36.9 C) 97.8 F (36.6 C) 98.6 F (37 C) 99.1 F (37.3 C)  TempSrc: Oral Oral    SpO2: 98% 97% 97% 97%  Weight:      Height:        General: Pt is alert, awake, not in acute distress Cardiovascular: RRR, S1/S2 +, no rubs, no gallops Respiratory: CTA bilaterally, no wheezing, no rhonchi Abdominal:  Soft, NT, ND, bowel sounds + Extremities: no edema, no cyanosis    The results of significant diagnostics from this hospitalization (including imaging, microbiology, ancillary and laboratory) are listed below for reference.     Microbiology: Recent Results (from the past 240 hour(s))  Urine Culture     Status: Abnormal   Collection Time: 10/26/22  1:28 PM   Specimen: Urine, Clean Catch  Result Value Ref Range Status   Specimen Description   Final    URINE, CLEAN CATCH Performed at [facility], [address] [place], [place] [address]    Special Requests   Final    NONE Performed at [facility], [address] [place], [place] [address]    Culture MULTIPLE SPECIES PRESENT, SUGGEST RECOLLECTION (A)  Final   Report Status 10/27/2022 FINAL  Final  Blood culture (routine x 2)     Status: None (Preliminary result)   Collection Time: 10/26/22  1:36 PM   Specimen: BLOOD  Result Value Ref Range Status   Specimen Description BLOOD LEFT ANTECUBITAL  Final   Special Requests   Final    BOTTLES DRAWN AEROBIC AND ANAEROBIC Blood Culture adequate volume   Culture    Final    NO GROWTH 4 DAYS Performed at [facility], [address] [place], [place] [address]    Report Status PENDING  Incomplete  Blood culture (routine x 2)     Status: None (Preliminary result)   Collection Time: 10/26/22  1:36 PM   Specimen: BLOOD  Result Value Ref Range Status   Specimen Description BLOOD RIGHT ANTECUBITAL  Final   Special Requests   Final    BOTTLES DRAWN AEROBIC AND ANAEROBIC Blood Culture adequate volume   Culture   Final    NO GROWTH 4 DAYS Performed at [facility], [address] [place], [place] [address]    Report Status PENDING  Incomplete  MRSA Next Gen by PCR, Nasal     Status: None   Collection Time: 10/26/22  4:01 PM   Specimen: Nasal Mucosa; Nasal Swab  Result Value Ref Range Status   MRSA by PCR Next Gen NOT DETECTED NOT DETECTED Final    Comment: (NOTE) The GeneXpert MRSA Assay (FDA approved for NASAL specimens only), is one component of a comprehensive MRSA colonization surveillance program. It is not intended to diagnose MRSA infection nor to guide or monitor treatment for MRSA infections. Test performance is not FDA approved in patients less than 39 years old. Performed at [facility], [address] [place], [place] [address]      Labs: BNP (last 3 results) Recent Labs    04/25/22 0408  BNP 941.7*   Basic Metabolic Panel: Recent Labs  Lab 10/26/22 0911 10/27/22 0442 10/28/22 0442 10/29/22 0441 10/30/22 0537 10/31/22 0427  NA 137 137 136 132* 133* 133*  K 4.9 4.2 3.4* 3.7 3.9 3.9  CL 97* 103 100 96* 99 100  CO2 '26 28 29 26 26 26  '$ GLUCOSE 199* 109* 87 77 122* 152*  BUN '21 18 15 14 12 15  '$ CREATININE 1.38* 1.13 1.00 0.98 0.93 0.94  CALCIUM 9.7 8.1* 8.4* 8.4* 8.3* 8.3*  MG 1.6* 1.8 1.7 1.6* 2.0  --   PHOS  --   --   --  2.5  --   --    Liver Function Tests: Recent Labs  Lab 10/26/22 0911 10/27/22 0442  AST 21 12*  ALT 19 11  ALKPHOS 78 52  BILITOT 0.9 0.4  PROT 7.8 5.2*  ALBUMIN 4.0 2.6*   Recent Labs  Lab  10/26/22 0911  LIPASE 31   No results for input(s): "AMMONIA" in the last 168 hours. CBC: Recent Labs  Lab 10/26/22 0911 10/27/22 0625 10/28/22 0442 10/29/22 0441 10/30/22 0537 10/31/22 0427  WBC 24.7* 16.0* 14.4* 13.6* 9.4 11.4*  NEUTROABS 21.7*  --   --   --   --   --   HGB 11.4* 8.2* 7.5* 8.8* 9.2* 9.4*  HCT 36.1* 26.7* 23.9* 27.7* 28.9* 29.3*  MCV 63.7* 64.5* 63.9* 66.0* 65.5* 66.0*  PLT 402* 287 268 249 256 264   Cardiac Enzymes: No results for input(s): "CKTOTAL", "CKMB", "CKMBINDEX", "TROPONINI" in the last 168 hours. BNP: Invalid input(s): "POCBNP" CBG: Recent Labs  Lab 10/30/22 1713 10/30/22 1951 10/31/22 0014 10/31/22 0419 10/31/22 0743  GLUCAP 153* 155* 134* 145* 160*   D-Dimer No results for input(s): "DDIMER" in the last 72 hours. Hgb A1c No results for input(s): "HGBA1C" in the last 72 hours. Lipid Profile No results for input(s): "CHOL", "HDL", "LDLCALC", "TRIG", "CHOLHDL", "LDLDIRECT" in the last 72 hours. Thyroid function studies No results for input(s): "TSH", "T4TOTAL", "T3FREE", "THYROIDAB" in the last 72 hours.  Invalid input(s): "FREET3" Anemia work up Recent Labs    10/30/22 0537  FERRITIN 652*  TIBC 165*  IRON 32*   Urinalysis    Component Value Date/Time   COLORURINE YELLOW 10/26/2022 1215   APPEARANCEUR HAZY (A) 10/26/2022 1215   APPEARANCEUR Cloudy (A) 09/25/2022 0943   LABSPEC 1.027 10/26/2022 1215   PHURINE 5.0 10/26/2022 1215   GLUCOSEU NEGATIVE 10/26/2022 1215   HGBUR LARGE (A) 10/26/2022 1215   BILIRUBINUR NEGATIVE 10/26/2022 1215   BILIRUBINUR Negative 09/25/2022 0943   KETONESUR NEGATIVE 10/26/2022 1215   PROTEINUR 30 (A) 10/26/2022 1215   NITRITE NEGATIVE 10/26/2022 1215   LEUKOCYTESUR LARGE (A) 10/26/2022 1215   Sepsis Labs Recent Labs  Lab 10/28/22 0442 10/29/22 0441 10/30/22 0537 10/31/22 0427  WBC 14.4* 13.6* 9.4 11.4*   Microbiology Recent Results (from the past 240 hour(s))  Urine Culture      Status: Abnormal   Collection Time: 10/26/22  1:28 PM   Specimen: Urine, Clean Catch  Result Value Ref Range Status   Specimen Description   Final    URINE, CLEAN CATCH Performed at [facility], [address], [place], [place] [address]    Special Requests   Final    NONE Performed at [facility], [address], [place], [place] [address]    Culture MULTIPLE SPECIES PRESENT, SUGGEST RECOLLECTION (A)  Final   Report Status 10/27/2022 FINAL  Final  Blood culture (routine x 2)     Status: None (Preliminary result)   Collection Time: 10/26/22  1:36 PM   Specimen: BLOOD  Result Value Ref Range Status   Specimen Description BLOOD LEFT ANTECUBITAL  Final   Special Requests   Final    BOTTLES DRAWN AEROBIC AND ANAEROBIC Blood Culture adequate volume   Culture   Final    NO GROWTH 4 DAYS Performed at [facility], [address], [place], [place] [address]    Report Status PENDING  Incomplete  Blood culture (routine x 2)     Status: None (Preliminary result)   Collection Time: 10/26/22  1:36 PM   Specimen: BLOOD  Result Value Ref Range Status   Specimen Description BLOOD RIGHT ANTECUBITAL  Final   Special Requests   Final    BOTTLES DRAWN AEROBIC AND ANAEROBIC Blood  Culture adequate volume   Culture   Final    NO GROWTH 4 DAYS Performed at [facility], [address], [place], [place] [address]    Report Status PENDING  Incomplete  MRSA Next Gen by PCR, Nasal     Status: None   Collection Time: 10/26/22  4:01 PM   Specimen: Nasal Mucosa; Nasal Swab  Result Value Ref Range Status   MRSA by PCR Next Gen NOT DETECTED NOT DETECTED Final    Comment: (NOTE) The GeneXpert MRSA Assay (FDA approved for NASAL specimens only), is one component of a comprehensive MRSA colonization surveillance program. It is not intended to diagnose MRSA infection nor to guide or monitor treatment for MRSA infections. Test performance is not FDA approved in patients less than 5 years old. Performed at  [facility], [address] [place], [place] [address]      Time coordinating discharge: 35 minutes  SIGNED:   Rodena Goldmann, DO [facility] 10/31/2022, 10:24 AM  If 7PM-7AM, please contact night-coverage [email or web address]

## 2022-10-31 NOTE — TOC Transition Note (Signed)
Transition of Care [facility]) - CM/SW Discharge Note   Patient Details  Name: Joshua Osborne MRN: [id number] Date of Birth: 1941/02/24  Transition of [facility]) CM/SW Contact:  Shade Flood, LCSW Phone Number: 10/31/2022, 10:32 AM   Clinical Narrative:     Pt medically stable for dc today per MD. Damaris Schooner with pt's wife and plan remains for dc home with hospice care at home. She will transport pt home. Updated [facility] of pt's dc and they will follow up with pt at home.  No other TOC needs identified for dc.  Final next level of care: Home w Hospice Care Barriers to Discharge: Barriers Resolved   Patient Goals and CMS Choice CMS Medicare.gov Compare Post Acute Care list provided to:: Patient Represenative (must comment) Choice offered to / list presented to : Patient, Spouse  Discharge Placement                         Discharge Plan and Services Additional resources added to the After Visit Summary for   In-house Referral: Clinical Social Work Discharge Planning Services: CM Consult                                 Social Determinants of Health (SDOH) Interventions SDOH Screenings   Food Insecurity: No Food Insecurity (10/27/2022)  Housing: Low Risk  (10/27/2022)  Transportation Needs: No Transportation Needs (10/27/2022)  Utilities: Not At Risk (10/27/2022)  Tobacco Use: Medium Risk (10/26/2022)     Readmission Risk Interventions    10/27/2022    3:30 PM 10/03/2022   10:56 AM  Readmission Risk Prevention Plan  Transportation Screening Complete Complete  HRI or [place] Complete   Social Work Consult for [place] Planning/Counseling Complete   Palliative Care Screening Not Applicable   Medication Review Press photographer) Complete Complete  HRI or [place]  Complete  SW Recovery Care/Counseling Consult  Complete  [place]  Not Applicable

## 2022-10-31 NOTE — Progress Notes (Signed)
[facility] Progress Note     Subjective: Patient seen and examined.  He is resting comfortably in bed.  He was able to tolerate his breakfast without nausea and vomiting.  He denies any significant abdominal pain.  He confirms still moving his bowels and passing flatus.  Objective: Vital signs in last 24 hours: Temp:  [97.8 F (36.6 C)-99.1 F (37.3 C)] 99.1 F (37.3 C) (01/23 0621) Pulse Rate:  [74-94] 94 (01/23 0621) Resp:  [16-17] 16 (01/23 0621) BP: (115-141)/(66-77) 141/77 (01/23 0621) SpO2:  [97 %] 97 % (01/23 0621) Last BM Date : 10/31/22  Intake/Output from previous day: 01/22 0701 - 01/23 0700 In: 1094.4 [P.O.:720; I.V.:9.4; IV Piggyback:365] Out: 675 [Urine:675] Intake/Output this shift: Total I/O In: 240 [P.O.:240] Out: -   General appearance: alert, cooperative, and no distress GI: Abdomen soft, nondistended, no percussion tenderness, nontender to palpation; no rigidity, guarding, rebound tenderness  Lab Results:  Recent Labs    10/30/22 0537 10/31/22 0427  WBC 9.4 11.4*  HGB 9.2* 9.4*  HCT 28.9* 29.3*  PLT 256 264   BMET Recent Labs    10/30/22 0537 10/31/22 0427  NA 133* 133*  K 3.9 3.9  CL 99 100  CO2 26 26  GLUCOSE 122* 152*  BUN 12 15  CREATININE 0.93 0.94  CALCIUM 8.3* 8.3*   PT/INR No results for input(s): "LABPROT", "INR" in the last 72 hours.  Studies/Results: No results found.  Anti-infectives: Anti-infectives (From admission, onward)    Start     Dose/Rate Route Frequency Ordered Stop   10/27/22 1500  meropenem (MERREM) 1 g in sodium chloride 0.9 % 100 mL IVPB        1 g 200 mL/hr over 30 Minutes Intravenous Every 8 hours 10/27/22 1317     10/26/22 1500  meropenem (MERREM) 1 g in sodium chloride 0.9 % 100 mL IVPB  Status:  Discontinued        1 g 200 mL/hr over 30 Minutes Intravenous Every 12 hours 10/26/22 1437 10/27/22 1317   10/26/22 1330  cefTRIAXone (ROCEPHIN) 1 g in sodium chloride 0.9 % 100 mL IVPB         1 g 200 mL/hr over 30 Minutes Intravenous  Once 10/26/22 1323 10/26/22 1413       Assessment/Plan:  Patient is an 82 year old male who was admitted with small bowel obstruction.  -Patient's bowel obstruction has subsequently resolved -Soft diet -Would continue bowel regimen at home to keep him regular -Stable for discharge from general surgery standpoint -Care per primary team   LOS: 5 days    Joshua Osborne A [name] 10/31/2022

## 2022-11-01 LAB — CULTURE, BLOOD (ROUTINE X 2)
Culture: NO GROWTH
Culture: NO GROWTH
Special Requests: ADEQUATE
Special Requests: ADEQUATE

## 2022-11-02 ENCOUNTER — Other Ambulatory Visit: Payer: MEDICARE

## 2022-11-02 ENCOUNTER — Ambulatory Visit: Payer: MEDICARE

## 2022-11-07 ENCOUNTER — Ambulatory Visit: Payer: MEDICARE

## 2022-11-07 ENCOUNTER — Ambulatory Visit: Payer: MEDICARE | Admitting: Hematology

## 2022-11-07 ENCOUNTER — Other Ambulatory Visit: Payer: MEDICARE

## 2022-11-25 ENCOUNTER — Other Ambulatory Visit: Payer: Self-pay | Admitting: Hematology

## 2022-11-26 ENCOUNTER — Other Ambulatory Visit: Payer: Self-pay | Admitting: Hematology

## 2022-11-27 ENCOUNTER — Other Ambulatory Visit: Payer: Self-pay | Admitting: *Deleted

## 2022-11-27 ENCOUNTER — Encounter (HOSPITAL_COMMUNITY): Payer: Self-pay | Admitting: Hematology

## 2022-11-27 ENCOUNTER — Encounter: Payer: Self-pay | Admitting: Hematology

## 2022-11-27 NOTE — Telephone Encounter (Signed)
Olanzapine refill approved.  Patient tolerating and is to continue therapy.

## 2022-12-08 DEATH — deceased

## 2022-12-13 ENCOUNTER — Other Ambulatory Visit: Payer: MEDICARE | Admitting: Urology

## 2022-12-13 IMAGING — PT NM PET TUM IMG INITIAL (PI) SKULL BASE T - THIGH
1 series · 2 of 2 positions shown · non-contrast
Comparison: CT 03/07/2022

CLINICAL DATA: Initial treatment strategy for liver lesion. History
of renal cell carcinoma. Indeterminate renal lesion.

EXAM:
NUCLEAR MEDICINE PET SKULL BASE TO THIGH
TECHNIQUE: 8.9 mCi F-18 FDG was injected intravenously. Full-ring PET imaging
was performed from the skull base to thigh after the radiotracer. CT
data was obtained and used for attenuation correction and anatomic
localization.
Fasting blood glucose: 179 mg/dl

[Series 1045: results mm oncology reading · 4.0mm · 1.01mm/px · 2 of 2 slices shown]
[im 1/2]
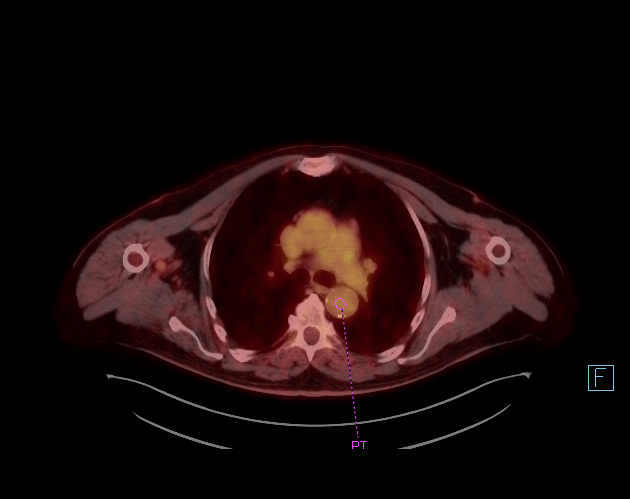
[im 2/2]
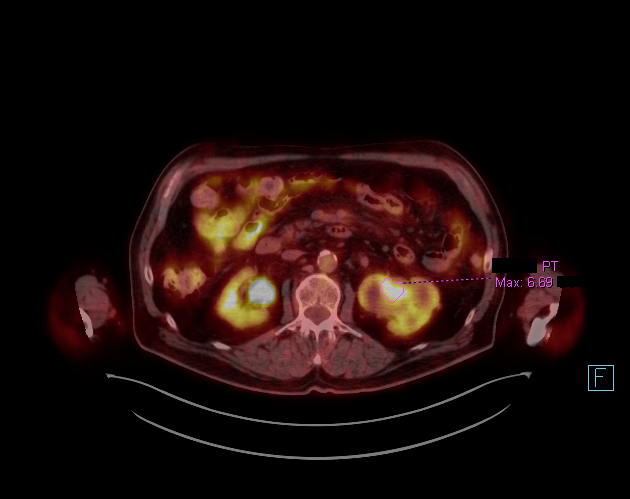

[2 of 2 positions shown; findings below may reference images not displayed]

FINDINGS: Mediastinal blood pool activity: SUV max

Liver activity: SUV max NA

NECK: No hypermetabolic lymph nodes in the neck.

Incidental CT findings: none

CHEST: No hypermetabolic mediastinal or hilar nodes. No suspicious
pulmonary nodules on the CT scan.

Incidental CT findings: none

ABDOMEN/PELVIS: No abnormal metabolic activity in the liver. There
are several low-density lesions with peripheral calcifications
without metabolic activity the RIGHT hepatic lobe.

The soft tissue mass within the LEFT renal pelvis is hypermetabolic
with SUV max equal 6.7 (image 171)

No hypermetabolic abdominopelvic lymph nodes.

Incidental CT findings: Prostate gland is enlarged.

SKELETON: No focal hypermetabolic activity to suggest skeletal
metastasis.

Incidental CT findings: none
IMPRESSION: 1. No metabolically active liver metastasis. Partially calcified
lesions within the RIGHT hepatic lobe do not have metabolic
activity.
2. There is hypermetabolic activity associated soft tissue mass
within the LEFT renal collecting system consistent with carcinoma.
3. No skeletal metastasis.
